# Patient Record
Sex: Female | Born: 1955
Health system: Southern US, Community
[De-identification: ages and names within clinical notes are randomized; demographics above are authoritative.]

## PROBLEM LIST (undated history)

## (undated) DIAGNOSIS — I471 Supraventricular tachycardia, unspecified: Secondary | ICD-10-CM

## (undated) DIAGNOSIS — I5022 Chronic systolic (congestive) heart failure: Secondary | ICD-10-CM

## (undated) DIAGNOSIS — I2589 Other forms of chronic ischemic heart disease: Secondary | ICD-10-CM

## (undated) DIAGNOSIS — N920 Excessive and frequent menstruation with regular cycle: Secondary | ICD-10-CM

## (undated) DIAGNOSIS — R5381 Other malaise: Secondary | ICD-10-CM

## (undated) DIAGNOSIS — I10 Essential (primary) hypertension: Secondary | ICD-10-CM

## (undated) DIAGNOSIS — E538 Deficiency of other specified B group vitamins: Secondary | ICD-10-CM

## (undated) DIAGNOSIS — I456 Pre-excitation syndrome: Secondary | ICD-10-CM

## (undated) DIAGNOSIS — Z789 Other specified health status: Secondary | ICD-10-CM

## (undated) DIAGNOSIS — D259 Leiomyoma of uterus, unspecified: Secondary | ICD-10-CM

## (undated) DIAGNOSIS — M25519 Pain in unspecified shoulder: Secondary | ICD-10-CM

## (undated) DIAGNOSIS — M25569 Pain in unspecified knee: Secondary | ICD-10-CM

## (undated) DIAGNOSIS — R000 Tachycardia, unspecified: Secondary | ICD-10-CM

## (undated) DIAGNOSIS — E039 Hypothyroidism, unspecified: Secondary | ICD-10-CM

## (undated) DIAGNOSIS — D509 Iron deficiency anemia, unspecified: Secondary | ICD-10-CM

## (undated) DIAGNOSIS — T23209A Burn of second degree of unspecified hand, unspecified site, initial encounter: Secondary | ICD-10-CM

## (undated) DIAGNOSIS — R5383 Other fatigue: Secondary | ICD-10-CM

## (undated) HISTORY — DX: Excessive and frequent menstruation with regular cycle: N92.0

## (undated) HISTORY — DX: Other specified health status: Z78.9

## (undated) HISTORY — DX: Other fatigue: R53.83

## (undated) HISTORY — PX: OTHER SURGICAL HISTORY: SHX169

## (undated) HISTORY — DX: Other forms of chronic ischemic heart disease: I25.89

## (undated) HISTORY — DX: Morbid (severe) obesity due to excess calories: E66.01

## (undated) HISTORY — DX: Pain in unspecified knee: M25.569

## (undated) HISTORY — DX: Hypothyroidism, unspecified: E03.9

## (undated) HISTORY — DX: Iron deficiency anemia, unspecified: D50.9

## (undated) HISTORY — DX: Essential (primary) hypertension: I10

## (undated) HISTORY — DX: Deficiency of other specified B group vitamins: E53.8

## (undated) HISTORY — DX: Supraventricular tachycardia, unspecified: I47.10

## (undated) HISTORY — DX: Pain in unspecified shoulder: M25.519

## (undated) HISTORY — DX: Burn of second degree of unspecified hand, unspecified site, initial encounter: T23.209A

## (undated) HISTORY — DX: Supraventricular tachycardia: I47.1

## (undated) HISTORY — DX: Pre-excitation syndrome: I45.6

## (undated) HISTORY — PX: DILATION AND CURETTAGE OF UTERUS: SHX78

## (undated) HISTORY — DX: Tachycardia, unspecified: R00.0

## (undated) HISTORY — DX: Other malaise: R53.81

## (undated) HISTORY — DX: Leiomyoma of uterus, unspecified: D25.9

## (undated) HISTORY — DX: Chronic systolic (congestive) heart failure: I50.22

---

## 2000-01-05 ENCOUNTER — Emergency Department (HOSPITAL_COMMUNITY): Admission: EM | Admit: 2000-01-05 | Discharge: 2000-01-05 | Payer: Self-pay | Admitting: Podiatry

## 2000-01-05 ENCOUNTER — Encounter: Payer: Self-pay | Admitting: *Deleted

## 2002-09-26 ENCOUNTER — Emergency Department (HOSPITAL_COMMUNITY): Admission: EM | Admit: 2002-09-26 | Discharge: 2002-09-26 | Payer: Self-pay | Admitting: Emergency Medicine

## 2004-02-02 ENCOUNTER — Emergency Department (HOSPITAL_COMMUNITY): Admission: EM | Admit: 2004-02-02 | Discharge: 2004-02-02 | Payer: Self-pay | Admitting: Emergency Medicine

## 2004-02-18 ENCOUNTER — Ambulatory Visit: Payer: Self-pay | Admitting: Family Medicine

## 2005-03-16 ENCOUNTER — Ambulatory Visit: Payer: Self-pay | Admitting: Family Medicine

## 2005-04-19 ENCOUNTER — Ambulatory Visit: Payer: Self-pay | Admitting: Family Medicine

## 2005-10-16 ENCOUNTER — Emergency Department (HOSPITAL_COMMUNITY): Admission: EM | Admit: 2005-10-16 | Discharge: 2005-10-16 | Payer: Self-pay | Admitting: Emergency Medicine

## 2005-10-17 ENCOUNTER — Ambulatory Visit: Payer: Self-pay | Admitting: Family Medicine

## 2005-10-21 ENCOUNTER — Ambulatory Visit: Payer: Self-pay | Admitting: Family Medicine

## 2005-11-30 ENCOUNTER — Ambulatory Visit: Payer: Self-pay | Admitting: Family Medicine

## 2005-12-23 ENCOUNTER — Ambulatory Visit: Payer: Self-pay | Admitting: Cardiology

## 2005-12-30 ENCOUNTER — Ambulatory Visit: Payer: Self-pay

## 2005-12-30 ENCOUNTER — Encounter: Payer: Self-pay | Admitting: Cardiology

## 2006-01-06 ENCOUNTER — Ambulatory Visit: Payer: Self-pay | Admitting: Cardiology

## 2006-03-18 ENCOUNTER — Emergency Department (HOSPITAL_COMMUNITY): Admission: EM | Admit: 2006-03-18 | Discharge: 2006-03-18 | Payer: Self-pay | Admitting: *Deleted

## 2006-05-18 ENCOUNTER — Encounter: Payer: Self-pay | Admitting: Family Medicine

## 2006-05-18 DIAGNOSIS — I498 Other specified cardiac arrhythmias: Secondary | ICD-10-CM | POA: Insufficient documentation

## 2006-05-18 DIAGNOSIS — D509 Iron deficiency anemia, unspecified: Secondary | ICD-10-CM | POA: Insufficient documentation

## 2006-06-11 ENCOUNTER — Emergency Department (HOSPITAL_COMMUNITY): Admission: EM | Admit: 2006-06-11 | Discharge: 2006-06-11 | Payer: Self-pay | Admitting: Emergency Medicine

## 2006-06-11 ENCOUNTER — Encounter: Payer: Self-pay | Admitting: Family Medicine

## 2006-08-29 ENCOUNTER — Emergency Department (HOSPITAL_COMMUNITY): Admission: EM | Admit: 2006-08-29 | Discharge: 2006-08-29 | Payer: Self-pay | Admitting: Emergency Medicine

## 2006-10-06 ENCOUNTER — Ambulatory Visit: Payer: Self-pay | Admitting: Family Medicine

## 2006-10-06 LAB — CONVERTED CEMR LAB: Rapid Strep: NEGATIVE

## 2006-10-12 ENCOUNTER — Telehealth (INDEPENDENT_AMBULATORY_CARE_PROVIDER_SITE_OTHER): Payer: Self-pay | Admitting: *Deleted

## 2006-11-28 ENCOUNTER — Ambulatory Visit: Payer: Self-pay | Admitting: Family Medicine

## 2006-11-29 ENCOUNTER — Ambulatory Visit: Payer: Self-pay | Admitting: Family Medicine

## 2006-11-30 ENCOUNTER — Telehealth: Payer: Self-pay | Admitting: Family Medicine

## 2006-12-01 ENCOUNTER — Ambulatory Visit: Payer: Self-pay | Admitting: Oncology

## 2006-12-04 LAB — CONVERTED CEMR LAB
Basophils Relative: 0 % (ref 0.0–1.0)
Eosinophils Absolute: 0.2 10*3/uL (ref 0.0–0.6)
Folate: 3.6 ng/mL
Hemoglobin: 7.7 g/dL — CL (ref 12.0–15.0)
Lymphocytes Relative: 18.5 % (ref 12.0–46.0)
MCV: 63.3 fL — ABNORMAL LOW (ref 78.0–100.0)
Monocytes Absolute: 0.4 10*3/uL (ref 0.2–0.7)
Monocytes Relative: 4 % (ref 3.0–11.0)
Neutro Abs: 7.1 10*3/uL (ref 1.4–7.7)
Platelets: 513 10*3/uL — ABNORMAL HIGH (ref 150–400)
Vitamin B-12: 178 pg/mL — ABNORMAL LOW (ref 211–911)

## 2006-12-07 ENCOUNTER — Ambulatory Visit: Payer: Self-pay | Admitting: Family Medicine

## 2006-12-07 LAB — CBC WITH DIFFERENTIAL (CANCER CENTER ONLY)
BASO#: 0.1 10*3/uL (ref 0.0–0.2)
BASO%: 0.5 % (ref 0.0–2.0)
EOS%: 2.3 % (ref 0.0–7.0)
HCT: 27.9 % — ABNORMAL LOW (ref 34.8–46.6)
LYMPH#: 2.2 10*3/uL (ref 0.9–3.3)
MCH: 18.8 pg — ABNORMAL LOW (ref 26.0–34.0)
MCHC: 30.8 g/dL — ABNORMAL LOW (ref 32.0–36.0)
MONO%: 6.8 % (ref 0.0–13.0)
NEUT#: 6.3 10*3/uL (ref 1.5–6.5)
NEUT%: 66.7 % (ref 39.6–80.0)
RDW: 15.7 % — ABNORMAL HIGH (ref 10.5–14.6)

## 2006-12-07 LAB — MORPHOLOGY - CHCC SATELLITE: PLT EST ~~LOC~~: INCREASED

## 2006-12-08 LAB — FERRITIN: Ferritin: 3 ng/mL — ABNORMAL LOW (ref 10–291)

## 2006-12-08 LAB — COMPREHENSIVE METABOLIC PANEL
ALT: 22 U/L (ref 0–35)
CO2: 23 mEq/L (ref 19–32)
Calcium: 9.2 mg/dL (ref 8.4–10.5)
Chloride: 103 mEq/L (ref 96–112)
Glucose, Bld: 127 mg/dL — ABNORMAL HIGH (ref 70–99)
Sodium: 136 mEq/L (ref 135–145)
Total Bilirubin: 0.4 mg/dL (ref 0.3–1.2)
Total Protein: 6.7 g/dL (ref 6.0–8.3)

## 2006-12-08 LAB — VITAMIN B12: Vitamin B-12: 244 pg/mL (ref 211–911)

## 2006-12-08 LAB — IRON AND TIBC
Iron: 18 ug/dL — ABNORMAL LOW (ref 42–145)
UIBC: 425 ug/dL

## 2007-01-03 ENCOUNTER — Encounter: Payer: Self-pay | Admitting: Family Medicine

## 2007-01-09 ENCOUNTER — Ambulatory Visit (HOSPITAL_COMMUNITY): Admission: RE | Admit: 2007-01-09 | Discharge: 2007-01-09 | Payer: Self-pay | Admitting: Obstetrics and Gynecology

## 2007-02-08 ENCOUNTER — Encounter: Payer: Self-pay | Admitting: Family Medicine

## 2007-03-02 ENCOUNTER — Emergency Department (HOSPITAL_COMMUNITY): Admission: EM | Admit: 2007-03-02 | Discharge: 2007-03-02 | Payer: Self-pay | Admitting: Emergency Medicine

## 2007-03-04 ENCOUNTER — Inpatient Hospital Stay (HOSPITAL_COMMUNITY): Admission: EM | Admit: 2007-03-04 | Discharge: 2007-03-04 | Payer: Self-pay | Admitting: Emergency Medicine

## 2007-03-06 ENCOUNTER — Encounter: Payer: Self-pay | Admitting: Family Medicine

## 2007-03-23 ENCOUNTER — Ambulatory Visit: Payer: Self-pay | Admitting: Cardiology

## 2007-04-12 ENCOUNTER — Encounter: Payer: Self-pay | Admitting: Family Medicine

## 2007-04-30 ENCOUNTER — Ambulatory Visit: Payer: Self-pay | Admitting: Family Medicine

## 2007-04-30 DIAGNOSIS — I456 Pre-excitation syndrome: Secondary | ICD-10-CM | POA: Insufficient documentation

## 2007-04-30 LAB — CONVERTED CEMR LAB
Bilirubin Urine: NEGATIVE
Casts: 0 /lpf
Ketones, urine, test strip: NEGATIVE
Nitrite: NEGATIVE
Yeast, UA: 0
pH: 6

## 2007-05-01 ENCOUNTER — Encounter: Payer: Self-pay | Admitting: Family Medicine

## 2007-05-01 LAB — CONVERTED CEMR LAB
Basophils Absolute: 0 10*3/uL (ref 0.0–0.1)
Lymphocytes Relative: 5.3 % — ABNORMAL LOW (ref 12.0–46.0)
MCHC: 30.9 g/dL (ref 30.0–36.0)
Monocytes Relative: 6.1 % (ref 3.0–12.0)
Neutrophils Relative %: 88 % — ABNORMAL HIGH (ref 43.0–77.0)
Platelets: 336 10*3/uL (ref 150–400)
RDW: 23.4 % — ABNORMAL HIGH (ref 11.5–14.6)

## 2007-05-25 HISTORY — PX: OTHER SURGICAL HISTORY: SHX169

## 2007-06-02 ENCOUNTER — Encounter: Payer: Self-pay | Admitting: Family Medicine

## 2007-10-26 ENCOUNTER — Ambulatory Visit: Payer: Self-pay | Admitting: Family Medicine

## 2007-10-26 DIAGNOSIS — I10 Essential (primary) hypertension: Secondary | ICD-10-CM | POA: Insufficient documentation

## 2007-10-31 LAB — CONVERTED CEMR LAB
Albumin: 4 g/dL (ref 3.5–5.2)
Alkaline Phosphatase: 74 units/L (ref 39–117)
BUN: 6 mg/dL (ref 6–23)
Basophils Absolute: 0 10*3/uL (ref 0.0–0.1)
Cholesterol: 243 mg/dL (ref 0–200)
Direct LDL: 156.3 mg/dL
Folate: 16.8 ng/mL
GFR calc Af Amer: 114 mL/min
GFR calc non Af Amer: 95 mL/min
HDL: 61.4 mg/dL (ref >39.0)
Hemoglobin: 13.1 g/dL (ref 12.0–15.0)
Lymphocytes Relative: 27.9 % (ref 12.0–46.0)
MCHC: 35 g/dL (ref 30.0–36.0)
Neutro Abs: 4.4 10*3/uL (ref 1.4–7.7)
Neutrophils Relative %: 62.4 % (ref 43.0–77.0)
Platelets: 244 10*3/uL (ref 150–400)
Potassium: 4 meq/L (ref 3.5–5.1)
RDW: 15.2 % — ABNORMAL HIGH (ref 11.5–14.6)
Total Bilirubin: 0.8 mg/dL (ref 0.3–1.2)
Total CHOL/HDL Ratio: 4
Triglycerides: 145 mg/dL (ref 0–149)
VLDL: 29 mg/dL (ref 0–40)
Vitamin B-12: 207 pg/mL — ABNORMAL LOW (ref 211–911)

## 2007-11-05 ENCOUNTER — Telehealth: Payer: Self-pay | Admitting: Family Medicine

## 2007-11-06 ENCOUNTER — Ambulatory Visit: Payer: Self-pay | Admitting: Family Medicine

## 2007-11-06 DIAGNOSIS — E039 Hypothyroidism, unspecified: Secondary | ICD-10-CM | POA: Insufficient documentation

## 2007-11-07 LAB — CONVERTED CEMR LAB
Free T4: 0.5 ng/dL — ABNORMAL LOW (ref 0.6–1.6)
T3 Uptake Ratio: 41.5 % — ABNORMAL HIGH (ref 22.5–37.0)

## 2007-11-12 ENCOUNTER — Ambulatory Visit: Payer: Self-pay | Admitting: Family Medicine

## 2007-11-12 DIAGNOSIS — E538 Deficiency of other specified B group vitamins: Secondary | ICD-10-CM | POA: Insufficient documentation

## 2007-12-26 ENCOUNTER — Ambulatory Visit: Payer: Self-pay | Admitting: Family Medicine

## 2008-01-01 LAB — CONVERTED CEMR LAB: Vitamin B-12: 214 pg/mL (ref 211–911)

## 2008-03-14 ENCOUNTER — Emergency Department (HOSPITAL_COMMUNITY): Admission: EM | Admit: 2008-03-14 | Discharge: 2008-03-14 | Payer: Self-pay | Admitting: Emergency Medicine

## 2008-03-14 ENCOUNTER — Encounter (INDEPENDENT_AMBULATORY_CARE_PROVIDER_SITE_OTHER): Payer: Self-pay | Admitting: Internal Medicine

## 2008-03-14 ENCOUNTER — Ambulatory Visit: Payer: Self-pay | Admitting: Family Medicine

## 2008-03-14 DIAGNOSIS — R000 Tachycardia, unspecified: Secondary | ICD-10-CM | POA: Insufficient documentation

## 2008-05-12 ENCOUNTER — Ambulatory Visit: Payer: Self-pay | Admitting: Family Medicine

## 2008-10-03 ENCOUNTER — Telehealth: Payer: Self-pay | Admitting: Family Medicine

## 2008-10-08 ENCOUNTER — Ambulatory Visit: Payer: Self-pay | Admitting: Family Medicine

## 2008-10-09 ENCOUNTER — Encounter: Payer: Self-pay | Admitting: Family Medicine

## 2008-10-17 LAB — CONVERTED CEMR LAB
BUN: 8 mg/dL (ref 6–23)
CO2: 27 meq/L (ref 19–32)
Chloride: 105 meq/L (ref 96–112)
Creatinine, Ser: 0.6 mg/dL (ref 0.4–1.2)
Eosinophils Absolute: 0.1 10*3/uL (ref 0.0–0.7)
MCHC: 34.1 g/dL (ref 30.0–36.0)
MCV: 92.9 fL (ref 78.0–100.0)
Monocytes Absolute: 0.3 10*3/uL (ref 0.1–1.0)
Neutrophils Relative %: 76 % (ref 43.0–77.0)
Platelets: 272 10*3/uL (ref 150.0–400.0)
TSH: 1.59 microintl units/mL (ref 0.35–5.50)
Vitamin B-12: 382 pg/mL (ref 211–911)

## 2008-11-01 ENCOUNTER — Ambulatory Visit: Payer: Self-pay | Admitting: Family Medicine

## 2008-11-01 ENCOUNTER — Encounter: Payer: Self-pay | Admitting: Family Medicine

## 2008-11-01 LAB — CONVERTED CEMR LAB
Blood in Urine, dipstick: NEGATIVE
Nitrite: NEGATIVE
WBC Urine, dipstick: NEGATIVE
pH: 6

## 2009-01-07 ENCOUNTER — Encounter: Payer: Self-pay | Admitting: Family Medicine

## 2009-03-13 ENCOUNTER — Ambulatory Visit: Payer: Self-pay | Admitting: Family Medicine

## 2009-03-13 LAB — CONVERTED CEMR LAB: Rapid Strep: NEGATIVE

## 2009-10-02 IMAGING — CR DG CHEST 1V PORT
1 series · 1 of 1 positions shown · non-contrast
Comparison: Cough chest x-ray of 10/16/2005

CLINICAL DATA: Arrhythmia, fever, cough

PORTABLE CHEST - 1 VIEW

[view not recorded]
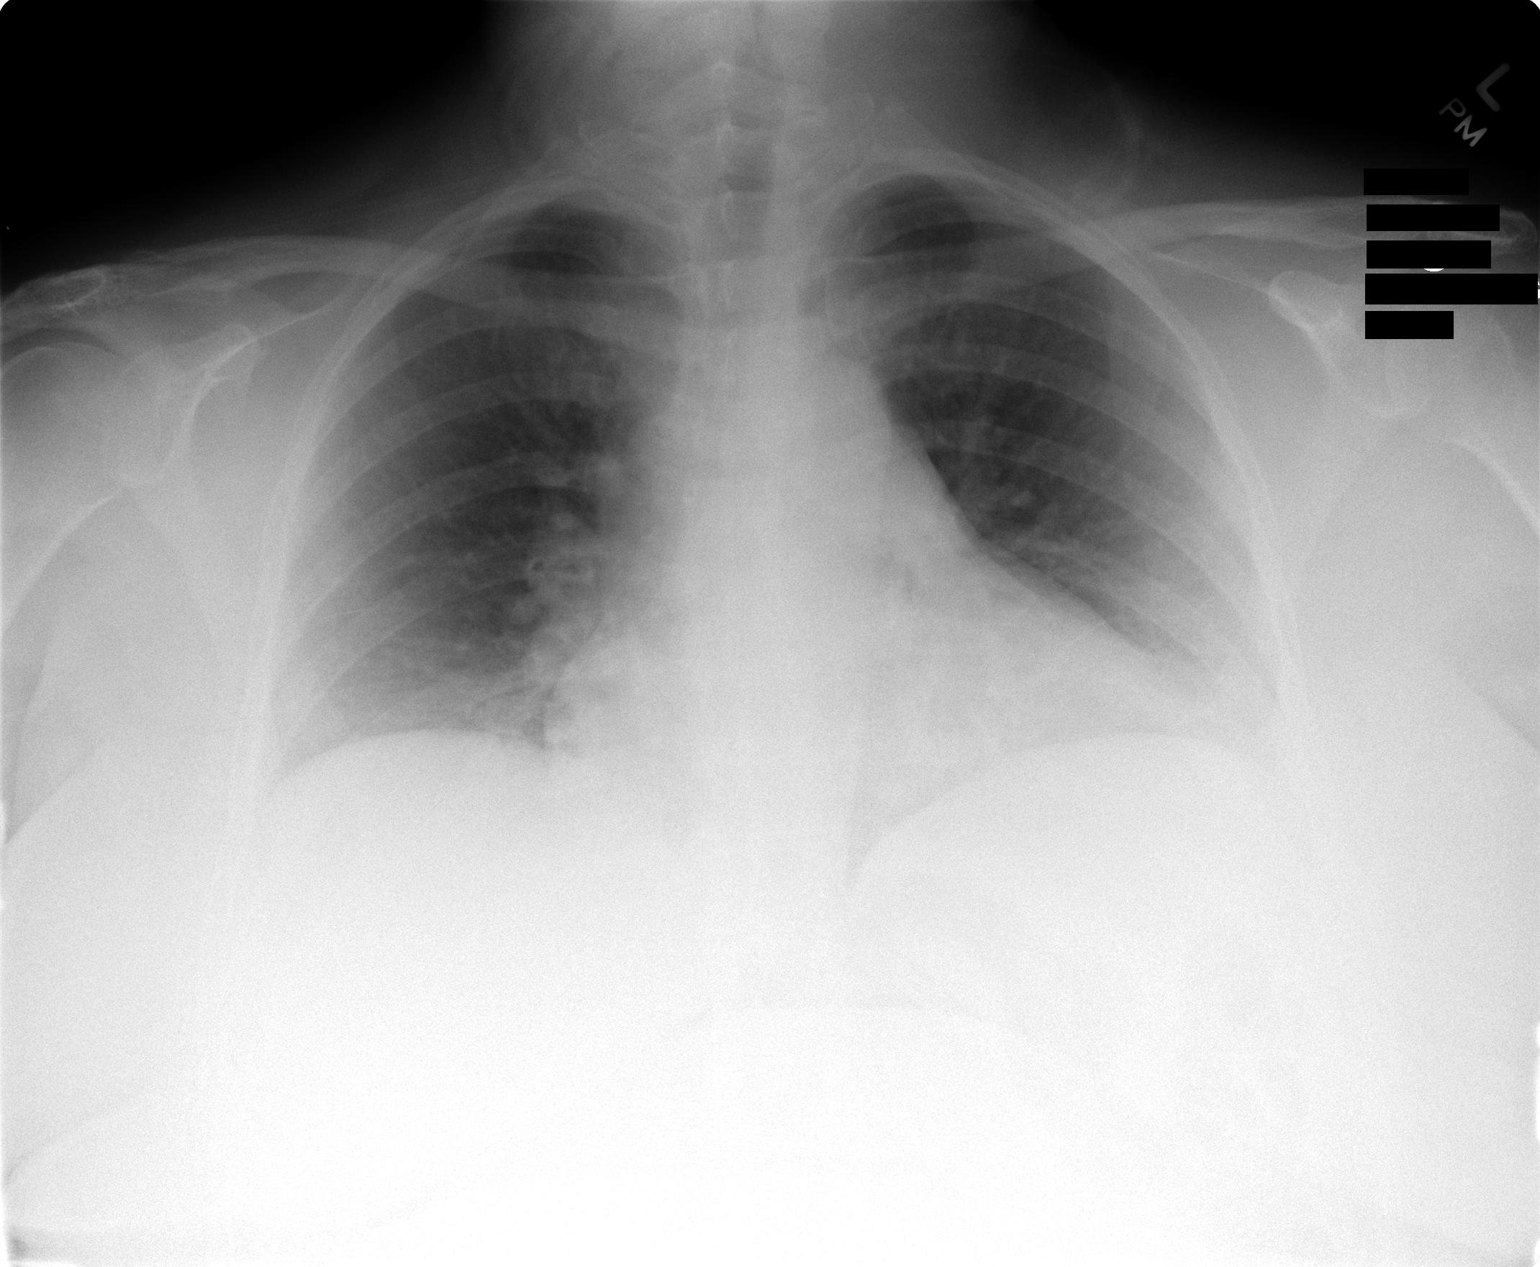

[1 of 1 positions shown; findings below may reference images not displayed]

FINDINGS: The patient has taken a suboptimal inspiration and there
is mild basilar atelectasis present.  No pneumonia or effusion is
seen.  There is mild cardiomegaly present.  No bony abnormality is
noted.
IMPRESSION: Suboptimal inspiration with mild basilar atelectasis.  No
infiltrate or effusion is seen.

## 2010-01-05 ENCOUNTER — Ambulatory Visit (HOSPITAL_COMMUNITY)
Admission: RE | Admit: 2010-01-05 | Discharge: 2010-01-05 | Payer: Self-pay | Source: Home / Self Care | Attending: Family Medicine | Admitting: Family Medicine

## 2010-02-14 ENCOUNTER — Encounter: Payer: Self-pay | Admitting: Obstetrics and Gynecology

## 2010-02-14 ENCOUNTER — Encounter: Payer: Self-pay | Admitting: Family Medicine

## 2010-02-23 NOTE — Letter (Signed)
Summary: Out of Work  [employer] at [facility]  [address] [place], [place] [address]   Phone: [phone]  Fax: [phone]    March 13, 2009   Employee:  Freddie L Ephraim    To Whom It May Concern:   For Medical reasons, please excuse the above named employee from work for the following dates:  Start:   03/12/2009  End:   can return if feeling better 03/18/2009  If you need additional information, please feel free to contact our office.         Sincerely,    Judith Part MD
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
## 2010-03-02 ENCOUNTER — Encounter: Payer: Self-pay | Admitting: Family Medicine

## 2010-03-24 ENCOUNTER — Ambulatory Visit (INDEPENDENT_AMBULATORY_CARE_PROVIDER_SITE_OTHER): Payer: 59 | Admitting: Family Medicine

## 2010-03-24 ENCOUNTER — Encounter: Payer: Self-pay | Admitting: Family Medicine

## 2010-03-24 ENCOUNTER — Other Ambulatory Visit: Payer: Self-pay | Admitting: Family Medicine

## 2010-03-24 DIAGNOSIS — E039 Hypothyroidism, unspecified: Secondary | ICD-10-CM

## 2010-03-24 DIAGNOSIS — E538 Deficiency of other specified B group vitamins: Secondary | ICD-10-CM

## 2010-03-24 DIAGNOSIS — Z0000 Encounter for general adult medical examination without abnormal findings: Secondary | ICD-10-CM

## 2010-03-24 DIAGNOSIS — R928 Other abnormal and inconclusive findings on diagnostic imaging of breast: Secondary | ICD-10-CM | POA: Insufficient documentation

## 2010-03-24 DIAGNOSIS — E785 Hyperlipidemia, unspecified: Secondary | ICD-10-CM | POA: Insufficient documentation

## 2010-03-24 DIAGNOSIS — R7309 Other abnormal glucose: Secondary | ICD-10-CM

## 2010-03-24 DIAGNOSIS — E1165 Type 2 diabetes mellitus with hyperglycemia: Secondary | ICD-10-CM | POA: Insufficient documentation

## 2010-03-24 LAB — CONVERTED CEMR LAB: Glucose, Bld: 171 mg/dL

## 2010-03-25 ENCOUNTER — Encounter: Payer: Self-pay | Admitting: Family Medicine

## 2010-03-25 LAB — CBC WITH DIFFERENTIAL/PLATELET
Basophils Absolute: 0 10*3/uL (ref 0.0–0.1)
Eosinophils Absolute: 0.2 10*3/uL (ref 0.0–0.7)
HCT: 43.5 % (ref 36.0–46.0)
Lymphs Abs: 2.8 10*3/uL (ref 0.7–4.0)
MCHC: 34.7 g/dL (ref 30.0–36.0)
Monocytes Absolute: 0.7 10*3/uL (ref 0.1–1.0)
Monocytes Relative: 5.9 % (ref 3.0–12.0)
Platelets: 303 10*3/uL (ref 150.0–400.0)
RDW: 12.5 % (ref 11.5–14.6)

## 2010-03-25 LAB — BASIC METABOLIC PANEL
BUN: 13 mg/dL (ref 6–23)
GFR: 99.81 mL/min (ref >60.00)
Glucose, Bld: 179 mg/dL — ABNORMAL HIGH (ref 70–99)
Potassium: 4.3 mEq/L (ref 3.5–5.1)

## 2010-03-25 LAB — T3, FREE: T3, Free: 2.6 pg/mL (ref 2.3–4.2)

## 2010-03-25 LAB — HEPATIC FUNCTION PANEL
AST: 22 U/L (ref 0–37)
Total Bilirubin: 0.7 mg/dL (ref 0.3–1.2)

## 2010-03-25 LAB — TSH: TSH: 2.35 u[IU]/mL (ref 0.35–5.50)

## 2010-03-25 LAB — VITAMIN B12: Vitamin B-12: 275 pg/mL (ref 211–911)
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
## 2010-04-05 ENCOUNTER — Encounter: Payer: Self-pay | Admitting: Family Medicine

## 2010-04-13 NOTE — Letter (Signed)
Summary: [facility]   Imported By: Kassie Mends 04/07/2010 08:48:47  _____________________________________________________________________  External Attachment:    Type:   Image     Comment:   External Document

## 2010-05-11 LAB — DIFFERENTIAL
Basophils Relative: 0 % (ref 0–1)
Lymphs Abs: 2.2 10*3/uL (ref 0.7–4.0)
Monocytes Relative: 11 % (ref 3–12)
Neutro Abs: 5.4 10*3/uL (ref 1.7–7.7)
Neutrophils Relative %: 63 % (ref 43–77)

## 2010-05-11 LAB — BASIC METABOLIC PANEL
BUN: 15 mg/dL (ref 6–23)
Calcium: 9.6 mg/dL (ref 8.4–10.5)
Creatinine, Ser: 0.85 mg/dL (ref 0.4–1.2)
GFR calc Af Amer: >60 mL/min (ref >60)
GFR calc non Af Amer: >60 mL/min (ref >60)

## 2010-05-11 LAB — CBC
Platelets: 231 10*3/uL (ref 150–400)
RBC: 4.64 MIL/uL (ref 3.87–5.11)
WBC: 8.6 10*3/uL (ref 4.0–10.5)

## 2010-06-08 NOTE — Assessment & Plan Note (Signed)
[facility]                            CARDIOLOGY OFFICE NOTE   NAME:Rosevear, [name]                        MRN:          [id number]  DATE:03/23/2007                            DOB:          07-Apr-1955    Becky Stout comes to the office today for preoperative clearance for a  possible D&C versus hysterectomy next week at [facility].  She  continues to have bleeding, and her hemoglobin was actually down to 7.1  March 04, 2007.   Unfortunately, she has continued to have breakthroughs with her  tachycardia and SVT.   We have placed her on Diltiazem extended release 240 mg a day, for both  her blood pressure and increasing her threshold for SVT.  She is not on  this today.   Her blood pressure today is 158/97, her pulse is 102 and she is in sinus  tachycardia by EKG.  Weight is 244 and stable.  HEENT:  Normocephalic, atraumatic.  PERLA.  Extraocular movements  intact.  Sclerae are clear.  Facial symmetry is normal.  She is pale.  Respirations 18 and unlabored.  Carotid upstrokes were equal bilaterally; without bruits and no JVD.  Thyroid is not enlarged.  Trachea is midline.  LUNGS:  Clear.  HEART:  Reveals a poorly appreciated PMI.  She has normal S1 and S2,  without gallop.  ABDOMEN:  Soft, good bowel sounds.  Organomegaly could not be adequately  assessed.  EXTREMITIES:  With no cyanosis or clubbing; 1+ edema.  Trace  pulses are intact.  NEUROLOGIC:  Exam is intact.   EKG:  Again, shows sinus tachycardia with RSR prime V1-V2, with some  slight ST-segment depression inferolaterally.   I had a nice talk with Becky Stout today.  We will clear her for surgery.  I am  concerned about her blood pressure and also the risk of SVT.  I placed  her back on Diltiazem extended release 240 mg a day.   We will see her back in about 6-8 weeks for follow-up.   Hopefully she can resolve this anemia by not bleeding further, have  minimal SVT; but, the question of  ablation also is here on the  background.  She will continue to consider this.     Thomas C. Daleen Squibb, MD, [facility]  Electronically Signed    TCW/MedQ  DD: 03/23/2007  DT: 03/24/2007  Job #: [id number]   cc:   [employer] Dub Mikes, NP.  [facility] [employer] A. Milinda Antis, MD

## 2010-06-08 NOTE — Discharge Summary (Signed)
NAME:  Becky Stout, Becky Stout                 ACCOUNT NO.:  [id number]   MEDICAL RECORD NO.:  [id number]          PATIENT TYPE:  INP   LOCATION:  1535                         FACILITY:  [facility]   PHYSICIAN:  Stacie Glaze, MD    DATE OF BIRTH:  04-12-1955   DATE OF ADMISSION:  03/04/2007  DATE OF DISCHARGE:  03/04/2007                               DISCHARGE SUMMARY   ADMITTING DIAGNOSES:  1. Blood loss anemia.  2. Wolff-Parkinson-White syndrome.   HOSPITAL COURSE:  The patient is a pleasant 55 year old white female  with a known history of Wolff-Parkinson-White syndrome who had presented  to the emergency room on multiple occasions for supraventricular  tachycardia related to her anemia.  Her anemia is felt to be secondary  to heavy menses and she has been followed by her gynecologist for  control of this symptomatology.  She has had chronic recurrent anemia  and when her blood counts are low and her oxygenation is low, she has a  flare of her Wolff-Parkinson-White syndrome.  She was also followed by  Dr. Lewayne Bunting for Wolff-Parkinson-White syndrome and ablation is  planned.  However, the issue of her chronic anemia is requested to be  dealt with by her gynecologist prior to her ablation therapy.   Today she presents with a rapid heart rate of approximately 180 in the  emergency room and was given adenosine and she converted to sinus rhythm  with a pulse in the 80s, her respiratory rate was stable at 18.  She was  afebrile and her blood pressure was 129/62.  She was typed and crossed  for 2 units of packed red blood cells and transfused.  After  transfusion, she was discharged home in stable condition with followup  with (1) Dr. Lewayne Bunting and (2) her primary care physician.      Stacie Glaze, MD  Electronically Signed     JEJ/MEDQ  D:  03/04/2007  T:  03/05/2007  Job:  [id number]   cc:   Doylene Canning. Ladona Ridgel, MD  [address]  [address]  [place]  [place] [address]

## 2010-06-11 NOTE — Assessment & Plan Note (Signed)
[facility]                            CARDIOLOGY OFFICE NOTE   NAME:Spychalski, DAWT REEB                        MRN:          [id number]  DATE:01/06/2006                            DOB:          1955/02/06    Ms. Hammar returns today for followup of her hypertension and SVT.  Please see my note from December 23, 2005.   A 2D echocardiogram was essentially normal, with no sign of LVH, EF of  65%, minimal left atrial enlargement and mild mitral regurgitation.   Her edema has improved on the hydrochlorothiazide 12.5 mg per day.  The  diltiazem extended-release 240 has not seemed to abate her tachycardia.  She does not feel like she has been in SVT, however.   PHYSICAL EXAMINATION:  Her blood pressure today is 164/94, her pulse is  110.  The rest of her exam is unchanged except she has only trace edema now in  the legs.   ASSESSMENT:  I spent about 20 minutes talking to her again today about  considering ablation.  She is still convinced that she wants to go with  medications.  She has been INTOLERANT OF BETA BLOCKERS in the past.  Her  blood pressure also is still under poor control.   PLAN:  1. Increased hydrochlorothiazide to 25 mg a day.  2. Check BMP today.  3. Increase diltiazem extended-release to 360 a day.   I will schedule a followup in 6 weeks.  Hopefully things will be under  better control.  She really does not want an ablation.     Thomas C. Daleen Squibb, MD, [facility]  Electronically Signed    TCW/MedQ  DD: 01/06/2006  DT: 01/07/2006  Job #: [id number]   cc:   Marne A. Milinda Antis, MD

## 2010-06-11 NOTE — Assessment & Plan Note (Signed)
[facility]                            [place] OFFICE NOTE   NAME:Stout, Becky GASSETT                        MRN:          [id number]  DATE:12/23/2005                            DOB:          [birth year]    CHIEF COMPLAINT:  My heart keeps going in and out of SVT again.   HISTORY OF PRESENT ILLNESS:  [name] is a 55 year old, single,  white female who has been evaluated in the past by Dr. Lewayne Stout.  Her last office visit was in 2003, for SVT.  She has carried a diagnosis  of SVT for several years.  On the last visit, Dr. Ladona Stout recommended  ablation.  He also recommended continuation of beta-blocker therapy.  She did neither.  She states that beta-blockers in the form of Toprol  and metoprolol make her very tired and fatigued.  She also experienced  some depression on Toprol.  Over the last several weeks she has had  several episodes of SVT.  These have been recorded at 200 to 220 beats  per minute.  She had to visit the emergency room at [facility]  with SVT, requiring adenosine administration on October 16, 2005,  please see Dr. Aram Stout note.  She says that her baseline heart rate  sometimes is fast anyway.  One of the problems is her physical  deconditioning as well as an anemia, chronic iron-deficiency anemia from  heavy periods.  Her last hemoglobin was 8.9 at Dr. Royden Stout office.   PAST MEDICAL HISTORY:  1. She has struggled with being overweight for a number of years.  2. Looking back through her chart, her blood pressure has always been      high in the office.  I reviewed those with her today.   SHE IS INTOLERANT TO TETRACYCLINE.   She is currently on iron 150 mg a day which she erratically takes.  She  has a hard time with high dose iron.   She does not smoke, drink, or use any recreational drugs.   She has had no dye reaction.   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   PAST SURGICAL HISTORY:  Negative.   FAMILY HISTORY:  Her father had quadruple bypass at age 68.   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  She is single and does not have any  children.   REVIEW OF SYSTEMS:  CONSTITUTIONAL:  No fever, chills, significant  weight changes recently.  HEENT:  She wears glasses.  She denies any  hearing problems.  She has had no recent dental problems.  NECK:  H.s.  been unremarkable with no swelling or soreness or decreased flexibility.  CARDIOVASCULAR:  She denies any orthopnea, PND.  She does have some  edema.  She denies any chest pain, per se.  LUNGS:  Denies any chronic  cough.  No hemoptysis.  No chronic infections.  GASTROINTESTINAL:  She  denies nausea, vomiting, dysphagia.  No melena, hematochezia.  GENITOURINARY:  Heavy periods which are slowing down as she ages.  No  urinary frequency, urgency, or hematuria.  MUSCULOSKELETAL:  Negative.  SKIN:  Negative.  LYMPH:  Negative.  NEUROPSYCHIATRIC:  Depression with  beta-blockers.   PHYSICAL EXAMINATION:  GENERAL:  She is very pleasant.  She is in no  acute distress.  SKIN:  Warm and dry, otherwise unremarkable.  Stout SIGNS:  Her blood pressure is 163/86.  Her pulse is 91 and  regular.  Her electrocardiogram shows a sinus rhythm with ST segment  changes, especially in the inferior and lateral leads.  This is not  significantly different from before.  Her weight is 248 pounds.  She is  5 feet 5 inches.  HEENT:  Normocephalic atraumatic.  She is wearing glasses.  She is pale.  PERRLA.  Extraocular movements intact.  Sclerae are clear.  Facial  symmetry is normal.  Dentition is satisfactory.  NECK:  Supple.  Carotid upstrokes are equal bilaterally without bruits.  There is no thyroid enlargement.  Trachea is midline.  LUNGS:  Clear to auscultation.  HEART:  Reveals a dynamic S1 S2.  There is no gallop.  ABDOMEN:  Soft with  good bowel sounds.  There is no tenderness.  There  is no distention.  EXTREMITIES:  Reveal 2+ pretibial edema.  Pulses were bilaterally  symmetrically present in upper and lower extremities.  NEUROLOGIC:  Intact.  MUSCULOSKELETAL:  Intact.   ASSESSMENT:  I had about a 30-minute discussion today with [name].  Her blood pressure has been elevated for a number of years despite the  fact that she says it is always under good control.  This concerns me  quite a bit.  In addition, her supraventricular tachycardia is now  happening more frequently which may just be a cycle but could be a  future pattern of consistency.  She does not tolerate beta-blockers.  I  am not sure medications will hold her as well.   PLAN:  I have discussed all these implications and factors quite openly  with her.  After much discussion we decided on the following plan:  1. Begin HCTZ 12.5 mg a day for hypertension and edema.  We may have      to increase this.  I am starting off slow with her history of      intolerance to medications.  2. Begin diltiazem extended release 240 mg a day, hopefully she can      tolerate this better than she has beta-blockers.  3. Salt restriction.  4. Increase activity.  5. A 2D echocardiogram.   I have put in a significant reinforcement along with Dr. Lubertha Stout  recommendations for ablation.  With her increasing age, severe anemia,  and recurrent SVT, not to mention intolerance to  medications, ablation would be a very attractive alternative.  She still  would like to think about this.  I will see her back in a couple of  weeks for followup blood pressure and the echo.     Becky C. Daleen Squibb, MD, [facility]  Electronically Signed    TCW/MedQ  DD: 12/23/2005  DT: 12/24/2005  Job #: [id number]   cc:   Becky A. Milinda Antis, MD

## 2010-07-09 ENCOUNTER — Encounter: Payer: Self-pay | Admitting: Cardiovascular Disease

## 2010-10-15 LAB — CROSSMATCH
ABO/RH(D): A NEG
Antibody Screen: NEGATIVE

## 2010-10-15 LAB — BASIC METABOLIC PANEL
BUN: 5 — ABNORMAL LOW
Calcium: 8.7
GFR calc non Af Amer: 41 — ABNORMAL LOW
Glucose, Bld: 228 — ABNORMAL HIGH
Potassium: 4.3

## 2010-10-15 LAB — HEMOGLOBIN AND HEMATOCRIT, BLOOD: HCT: 23 — ABNORMAL LOW

## 2010-10-15 LAB — POCT CARDIAC MARKERS
CKMB, poc: <1 — ABNORMAL LOW
Myoglobin, poc: 83.5
Operator id: 3067
Troponin i, poc: <0.05

## 2010-10-15 LAB — DIFFERENTIAL
Basophils Absolute: 0
Eosinophils Absolute: 0
Lymphocytes Relative: 7 — ABNORMAL LOW
Monocytes Relative: 4
Neutrophils Relative %: 89 — ABNORMAL HIGH

## 2010-10-15 LAB — CBC
HCT: 24.9 — ABNORMAL LOW
Platelets: 490 — ABNORMAL HIGH
RDW: 19.6 — ABNORMAL HIGH
WBC: 11.1 — ABNORMAL HIGH

## 2011-04-08 ENCOUNTER — Encounter: Payer: Self-pay | Admitting: Family Medicine

## 2011-04-08 ENCOUNTER — Ambulatory Visit (INDEPENDENT_AMBULATORY_CARE_PROVIDER_SITE_OTHER): Payer: 59 | Admitting: Family Medicine

## 2011-04-08 VITALS — BP 142/86 | HR 88 | Temp 98.3°F | Ht 64.0 in | Wt 237.8 lb

## 2011-04-08 DIAGNOSIS — E039 Hypothyroidism, unspecified: Secondary | ICD-10-CM

## 2011-04-08 DIAGNOSIS — K900 Celiac disease: Secondary | ICD-10-CM

## 2011-04-08 DIAGNOSIS — E785 Hyperlipidemia, unspecified: Secondary | ICD-10-CM

## 2011-04-08 DIAGNOSIS — IMO0002 Reserved for concepts with insufficient information to code with codable children: Secondary | ICD-10-CM

## 2011-04-08 DIAGNOSIS — Z1231 Encounter for screening mammogram for malignant neoplasm of breast: Secondary | ICD-10-CM

## 2011-04-08 DIAGNOSIS — Z0000 Encounter for general adult medical examination without abnormal findings: Secondary | ICD-10-CM

## 2011-04-08 DIAGNOSIS — E119 Type 2 diabetes mellitus without complications: Secondary | ICD-10-CM

## 2011-04-08 DIAGNOSIS — Z1211 Encounter for screening for malignant neoplasm of colon: Secondary | ICD-10-CM

## 2011-04-08 DIAGNOSIS — K9041 Non-celiac gluten sensitivity: Secondary | ICD-10-CM | POA: Insufficient documentation

## 2011-04-08 DIAGNOSIS — E538 Deficiency of other specified B group vitamins: Secondary | ICD-10-CM

## 2011-04-08 MED ORDER — EPINEPHRINE 0.3 MG/0.3ML IJ DEVI: 0.3000 mg | Freq: Once | INTRAMUSCULAR | Status: DC

## 2011-04-08 MED ORDER — THYROID 16.25 MG PO TABS: 1.0000 | ORAL_TABLET | Freq: Every day | ORAL | Status: DC

## 2011-04-08 NOTE — Patient Instructions (Addendum)
Start diabetic education through your job Follow up with me in 6 months  Please make your annual eye exam for diabetes  Please do ifob stool card for colon cancer screening  Labs today We will do referral for mammogram -- you can make your own appt (talk to [facility] on the way out) Avoid red meat/ fried foods/ egg yolks/ fatty breakfast meats/ butter, cheese and high fat dairy/ and shellfish  - for cholesterol Avoid sweets and sugar drinks for diabetes
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
## 2011-04-09 LAB — COMPREHENSIVE METABOLIC PANEL
AST: 25 U/L (ref 0–37)
Albumin: 4.4 g/dL (ref 3.5–5.2)
Alkaline Phosphatase: 90 U/L (ref 39–117)
BUN: 13 mg/dL (ref 6–23)
Creat: 0.64 mg/dL (ref 0.50–1.10)
Glucose, Bld: 211 mg/dL — ABNORMAL HIGH (ref 70–99)
Total Bilirubin: 0.4 mg/dL (ref 0.3–1.2)

## 2011-04-09 LAB — VITAMIN B12: Vitamin B-12: 1975 pg/mL — ABNORMAL HIGH (ref 211–911)

## 2011-04-09 LAB — T3, FREE: T3, Free: 2.8 pg/mL (ref 2.3–4.2)

## 2011-04-09 LAB — T4, FREE: Free T4: 0.91 ng/dL (ref 0.80–1.80)

## 2011-04-10 ENCOUNTER — Encounter: Payer: Self-pay | Admitting: Family Medicine

## 2011-04-10 DIAGNOSIS — Z0000 Encounter for general adult medical examination without abnormal findings: Secondary | ICD-10-CM | POA: Insufficient documentation

## 2011-04-10 NOTE — Assessment & Plan Note (Signed)
Pt now taking po without shots Check level No symptoms

## 2011-04-10 NOTE — Assessment & Plan Note (Signed)
Reviewed health habits including diet and exercise and skin cancer prevention Also reviewed health mt list, fam hx and immunizations  Disc need for wt loss Pt declines some health mt recommendations Rev labs from work and other labs done today

## 2011-04-10 NOTE — Assessment & Plan Note (Signed)
Controlled with lifestyle since pt declines medication for this  Today not bad  disd red flags to watch for  Lab today

## 2011-04-10 NOTE — Assessment & Plan Note (Signed)
Scheduled annual screening mammogram Nl breast exam today  Encouraged monthly self exams   

## 2011-04-10 NOTE — Assessment & Plan Note (Signed)
Disc goals for lipids and reasons to control them Rev labs with pt Rev low sat fat diet in detail Pt declines meds of any sort Disc risks of high chol exp in setting of DM

## 2011-04-10 NOTE — Assessment & Plan Note (Signed)
a1c is now over 9  Pt still hesitant to accept this dx  Pt declines DM teaching/ medication or other interventions She will investigate free nutrional consult from her job Is aware of role of obesity and need for wt loss Agreed to 6 mo f/u - but no other intervention at this time

## 2011-04-10 NOTE — Assessment & Plan Note (Signed)
Changed to naturethroid- pt pref product (not animal based) Lab today Will update if any problems or changes

## 2011-04-10 NOTE — Assessment & Plan Note (Signed)
This is new- , pt does better on gluten free diet Re: GI issues  No susp of celiac dz however at this time

## 2011-04-10 NOTE — Assessment & Plan Note (Signed)
Pt declines colonosc Will do IFOB No stool changes

## 2011-04-13 ENCOUNTER — Encounter: Payer: Self-pay | Admitting: Family Medicine

## 2011-04-13 ENCOUNTER — Encounter: Payer: Self-pay | Admitting: *Deleted

## 2011-11-24 ENCOUNTER — Other Ambulatory Visit: Payer: 59

## 2011-11-28 ENCOUNTER — Ambulatory Visit: Payer: 59 | Admitting: Family Medicine

## 2011-11-30 ENCOUNTER — Ambulatory Visit (INDEPENDENT_AMBULATORY_CARE_PROVIDER_SITE_OTHER): Payer: 59 | Admitting: Family Medicine

## 2011-11-30 ENCOUNTER — Encounter: Payer: Self-pay | Admitting: Family Medicine

## 2011-11-30 VITALS — BP 154/90 | HR 82 | Temp 98.8°F | Ht 64.0 in | Wt 237.5 lb

## 2011-11-30 DIAGNOSIS — IMO0002 Reserved for concepts with insufficient information to code with codable children: Secondary | ICD-10-CM

## 2011-11-30 DIAGNOSIS — E785 Hyperlipidemia, unspecified: Secondary | ICD-10-CM

## 2011-11-30 DIAGNOSIS — E119 Type 2 diabetes mellitus without complications: Secondary | ICD-10-CM

## 2011-11-30 DIAGNOSIS — E039 Hypothyroidism, unspecified: Secondary | ICD-10-CM

## 2011-11-30 LAB — COMPREHENSIVE METABOLIC PANEL
ALT: 29 U/L (ref 0–35)
AST: 23 U/L (ref 0–37)
Albumin: 3.8 g/dL (ref 3.5–5.2)
BUN: 9 mg/dL (ref 6–23)
CO2: 25 mEq/L (ref 19–32)
Calcium: 9.2 mg/dL (ref 8.4–10.5)
Chloride: 104 mEq/L (ref 96–112)
Creatinine, Ser: 0.6 mg/dL (ref 0.4–1.2)
GFR: 115.12 mL/min (ref >60.00)
Potassium: 4 mEq/L (ref 3.5–5.1)

## 2011-11-30 LAB — CBC WITH DIFFERENTIAL/PLATELET
Basophils Absolute: 0 10*3/uL (ref 0.0–0.1)
Basophils Relative: 0.6 % (ref 0.0–3.0)
Eosinophils Absolute: 0.1 10*3/uL (ref 0.0–0.7)
Hemoglobin: 14.1 g/dL (ref 12.0–15.0)
Lymphocytes Relative: 22.1 % (ref 12.0–46.0)
MCHC: 33.2 g/dL (ref 30.0–36.0)
Monocytes Relative: 7.4 % (ref 3.0–12.0)
Neutro Abs: 5.5 10*3/uL (ref 1.4–7.7)
Neutrophils Relative %: 68.8 % (ref 43.0–77.0)
RBC: 4.65 Mil/uL (ref 3.87–5.11)

## 2011-11-30 LAB — LIPID PANEL
Total CHOL/HDL Ratio: 5
VLDL: 62.8 mg/dL — ABNORMAL HIGH (ref 0.0–40.0)

## 2011-11-30 LAB — LDL CHOLESTEROL, DIRECT: Direct LDL: 171.9 mg/dL

## 2011-11-30 LAB — MICROALBUMIN / CREATININE URINE RATIO
Creatinine,U: 73 mg/dL
Microalb Creat Ratio: 1 mg/g (ref 0.0–30.0)

## 2011-11-30 MED ORDER — THYROID 16.25 MG PO TABS: 1.0000 | ORAL_TABLET | Freq: Every day | ORAL | Status: DC

## 2011-11-30 NOTE — Assessment & Plan Note (Signed)
Pt declines any tx for lipids - but wants to check them today Disc low sat fat diet and risks of high chol

## 2011-11-30 NOTE — Assessment & Plan Note (Signed)
Pt declines DM teaching but has books Diet not optimal and no exercise  She declines tx at this time Will check a1c and microalb however  Urged to start changing lifestyle habits and disc long term consequences of untx DM

## 2011-11-30 NOTE — Assessment & Plan Note (Signed)
tsh today No clinical changes Refilled her med - which she tolerates well No heart palpitations

## 2011-11-30 NOTE — Assessment & Plan Note (Signed)
bp high today - pt blames it on stress She declines tx at this time but we did disc pos of ace in future given her DM
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
## 2011-11-30 NOTE — Patient Instructions (Addendum)
bp is high - so work on [employer] and exericse Do your best not to stress eat  Labs today  I refilled your thyroid medicine

## 2011-12-02 ENCOUNTER — Encounter: Payer: Self-pay | Admitting: *Deleted

## 2011-12-05 ENCOUNTER — Encounter: Payer: Self-pay | Admitting: *Deleted

## 2012-01-02 ENCOUNTER — Encounter: Payer: Self-pay | Admitting: Family Medicine

## 2012-04-13 ENCOUNTER — Encounter: Payer: 59 | Admitting: Family Medicine

## 2012-04-27 ENCOUNTER — Other Ambulatory Visit: Payer: 59

## 2012-04-27 ENCOUNTER — Ambulatory Visit (INDEPENDENT_AMBULATORY_CARE_PROVIDER_SITE_OTHER): Payer: 59 | Admitting: Family Medicine

## 2012-04-27 ENCOUNTER — Encounter: Payer: Self-pay | Admitting: Family Medicine

## 2012-04-27 VITALS — BP 130/80 | HR 88 | Temp 98.1°F | Ht 64.25 in | Wt 233.2 lb

## 2012-04-27 DIAGNOSIS — Z0000 Encounter for general adult medical examination without abnormal findings: Secondary | ICD-10-CM

## 2012-04-27 DIAGNOSIS — E039 Hypothyroidism, unspecified: Secondary | ICD-10-CM

## 2012-04-27 DIAGNOSIS — IMO0002 Reserved for concepts with insufficient information to code with codable children: Secondary | ICD-10-CM

## 2012-04-27 DIAGNOSIS — Z1211 Encounter for screening for malignant neoplasm of colon: Secondary | ICD-10-CM

## 2012-04-27 DIAGNOSIS — E785 Hyperlipidemia, unspecified: Secondary | ICD-10-CM

## 2012-04-27 DIAGNOSIS — E119 Type 2 diabetes mellitus without complications: Secondary | ICD-10-CM

## 2012-04-27 MED ORDER — THYROID 16.25 MG PO TABS: 1.0000 | ORAL_TABLET | Freq: Every day | ORAL | Status: DC

## 2012-04-27 NOTE — Assessment & Plan Note (Signed)
Pt continues to decline any DM tx whatsoever She was counseled on risks of DM and need for lifestyle change and wt loss Info given

## 2012-04-27 NOTE — Assessment & Plan Note (Signed)
Stable tsh fall  F/u 6 mo  No change in dose

## 2012-04-27 NOTE — Assessment & Plan Note (Signed)
bp is stable today  No cp or palpitations or headaches or edema  No side effects to medicines  BP Readings from Last 3 Encounters:  04/27/12 130/80  11/30/11 154/90  04/08/11 142/86

## 2012-04-27 NOTE — Assessment & Plan Note (Signed)
Reviewed health habits including diet and exercise and skin cancer prevention Also reviewed health mt list, fam hx and immunizations  Pt declines most health mt Nl breast exam today Given ifob

## 2012-04-27 NOTE — Assessment & Plan Note (Signed)
Pt declines colonosc but will do IFOB

## 2012-04-27 NOTE — Patient Instructions (Addendum)
Sugar is high - your diabetes is worsening - when you are ready to address this please let me know Also cholesterol is high and warrants treatment  Here are some handouts- try to avoid fats and sugars in diet Do ifob card for colon cancer screen If you change your mind about any immunizations or screening tests please let me know  Follow up in 6 months
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
## 2012-04-27 NOTE — Assessment & Plan Note (Signed)
LDL down, but trig up due to worsening DM She declines tx Disc goals for lipids and reasons to control them Rev labs with pt Rev low sat fat diet in detail

## 2012-10-31 ENCOUNTER — Ambulatory Visit: Payer: 59 | Admitting: Family Medicine

## 2012-12-05 ENCOUNTER — Other Ambulatory Visit: Payer: Self-pay | Admitting: Family Medicine

## 2012-12-21 ENCOUNTER — Ambulatory Visit (INDEPENDENT_AMBULATORY_CARE_PROVIDER_SITE_OTHER): Payer: 59 | Admitting: Family Medicine

## 2012-12-21 ENCOUNTER — Encounter: Payer: Self-pay | Admitting: Family Medicine

## 2012-12-21 ENCOUNTER — Telehealth: Payer: Self-pay | Admitting: Family Medicine

## 2012-12-21 VITALS — BP 128/78 | HR 87 | Temp 98.3°F | Ht 64.25 in

## 2012-12-21 DIAGNOSIS — N760 Acute vaginitis: Secondary | ICD-10-CM

## 2012-12-21 DIAGNOSIS — E039 Hypothyroidism, unspecified: Secondary | ICD-10-CM

## 2012-12-21 DIAGNOSIS — E119 Type 2 diabetes mellitus without complications: Secondary | ICD-10-CM

## 2012-12-21 DIAGNOSIS — E785 Hyperlipidemia, unspecified: Secondary | ICD-10-CM

## 2012-12-21 DIAGNOSIS — IMO0002 Reserved for concepts with insufficient information to code with codable children: Secondary | ICD-10-CM

## 2012-12-21 DIAGNOSIS — R350 Frequency of micturition: Secondary | ICD-10-CM

## 2012-12-21 LAB — COMPREHENSIVE METABOLIC PANEL
ALT: 36 U/L — ABNORMAL HIGH (ref 0–35)
AST: 25 U/L (ref 0–37)
Alkaline Phosphatase: 77 U/L (ref 39–117)
Creatinine, Ser: 0.6 mg/dL (ref 0.4–1.2)
GFR: 114.66 mL/min (ref >60.00)
Sodium: 135 mEq/L (ref 135–145)
Total Bilirubin: 0.8 mg/dL (ref 0.3–1.2)

## 2012-12-21 LAB — POCT WET PREP (WET MOUNT): KOH Wet Prep POC: POSITIVE

## 2012-12-21 LAB — LIPID PANEL
Cholesterol: 243 mg/dL — ABNORMAL HIGH (ref 0–200)
HDL: 56.1 mg/dL (ref >39.00)
Total CHOL/HDL Ratio: 4
Triglycerides: 203 mg/dL — ABNORMAL HIGH (ref 0.0–149.0)

## 2012-12-21 LAB — POCT URINALYSIS DIPSTICK
Bilirubin, UA: NEGATIVE
Nitrite, UA: NEGATIVE
pH, UA: 6

## 2012-12-21 LAB — LDL CHOLESTEROL, DIRECT: Direct LDL: 163.6 mg/dL

## 2012-12-21 MED ORDER — TERCONAZOLE 0.4 % VA CREA: TOPICAL_CREAM | VAGINAL | Status: DC

## 2012-12-21 MED ORDER — THYROID 16.25 MG PO TABS: 1.0000 | ORAL_TABLET | Freq: Every day | ORAL | Status: DC

## 2012-12-21 NOTE — Telephone Encounter (Signed)
Please send for urine culture and let pt know we are doing that-Unsure if uti ? Tell her to drink lots of water  I put the order in

## 2012-12-21 NOTE — Progress Notes (Signed)
Pre-visit discussion using our clinic review tool. No additional management support is needed unless otherwise documented below in the visit note.  

## 2012-12-21 NOTE — Telephone Encounter (Signed)
Pt notified and culture sent.

## 2012-12-21 NOTE — Patient Instructions (Signed)
Keep the itchy area as dry as possible and use the terazol cream daily  Update me if no improvement or any problems  Stop caffeine  Lab today  Try work on a healthier diet
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
## 2012-12-23 NOTE — Assessment & Plan Note (Signed)
Lab today for A1C- pt did request that  Pt declines any treatment whatsoever - and understands the risks of that  Disc lifestyle change and dire need for low sugar diet and wt loss

## 2012-12-23 NOTE — Assessment & Plan Note (Signed)
Disc goals for lipids and reasons to control them Lab today  Rev low sat fat diet in detail  

## 2012-12-23 NOTE — Assessment & Plan Note (Signed)
Lab today  Has had some SVT lately -no other clinical changes

## 2012-12-23 NOTE — Assessment & Plan Note (Signed)
BP: 128/78 mmHg  bp in fair control at this time  No changes needed Disc lifstyle change with low sodium diet and exercise   Lab today

## 2012-12-23 NOTE — Assessment & Plan Note (Signed)
tx with terazol cream externally Disc need to keep dry

## 2012-12-27 ENCOUNTER — Encounter: Payer: Self-pay | Admitting: *Deleted

## 2012-12-28 ENCOUNTER — Ambulatory Visit: Payer: 59 | Admitting: Family Medicine

## 2013-05-10 ENCOUNTER — Encounter: Payer: Self-pay | Admitting: Family Medicine

## 2013-05-10 ENCOUNTER — Ambulatory Visit (INDEPENDENT_AMBULATORY_CARE_PROVIDER_SITE_OTHER): Payer: 59 | Admitting: Family Medicine

## 2013-05-10 VITALS — BP 128/74 | HR 92 | Temp 98.7°F | Ht 64.25 in | Wt 227.8 lb

## 2013-05-10 DIAGNOSIS — IMO0002 Reserved for concepts with insufficient information to code with codable children: Secondary | ICD-10-CM

## 2013-05-10 DIAGNOSIS — E119 Type 2 diabetes mellitus without complications: Secondary | ICD-10-CM

## 2013-05-10 DIAGNOSIS — Z0000 Encounter for general adult medical examination without abnormal findings: Secondary | ICD-10-CM

## 2013-05-10 DIAGNOSIS — Z1211 Encounter for screening for malignant neoplasm of colon: Secondary | ICD-10-CM

## 2013-05-10 DIAGNOSIS — E785 Hyperlipidemia, unspecified: Secondary | ICD-10-CM

## 2013-05-10 DIAGNOSIS — E039 Hypothyroidism, unspecified: Secondary | ICD-10-CM

## 2013-05-10 LAB — CBC WITH DIFFERENTIAL/PLATELET
BASOS ABS: 0 10*3/uL (ref 0.0–0.1)
BASOS PCT: 0 % (ref 0–1)
EOS ABS: 0.1 10*3/uL (ref 0.0–0.7)
EOS PCT: 1 % (ref 0–5)
HCT: 42.1 % (ref 36.0–46.0)
Hemoglobin: 14.3 g/dL (ref 12.0–15.0)
LYMPHS ABS: 2.5 10*3/uL (ref 0.7–4.0)
Lymphocytes Relative: 27 % (ref 12–46)
MCH: 30.4 pg (ref 26.0–34.0)
MCHC: 34 g/dL (ref 30.0–36.0)
MCV: 89.6 fL (ref 78.0–100.0)
Monocytes Absolute: 0.6 10*3/uL (ref 0.1–1.0)
Monocytes Relative: 7 % (ref 3–12)
NEUTROS PCT: 65 % (ref 43–77)
Neutro Abs: 6 10*3/uL (ref 1.7–7.7)
PLATELETS: 261 10*3/uL (ref 150–400)
RBC: 4.7 MIL/uL (ref 3.87–5.11)
RDW: 13.4 % (ref 11.5–15.5)
WBC: 9.2 10*3/uL (ref 4.0–10.5)

## 2013-05-10 LAB — COMPREHENSIVE METABOLIC PANEL
ALK PHOS: 91 U/L (ref 39–117)
ALT: 30 U/L (ref 0–35)
AST: 20 U/L (ref 0–37)
Albumin: 4.1 g/dL (ref 3.5–5.2)
BILIRUBIN TOTAL: 0.6 mg/dL (ref 0.2–1.2)
BUN: 12 mg/dL (ref 6–23)
CO2: 23 meq/L (ref 19–32)
CREATININE: 0.56 mg/dL (ref 0.50–1.10)
Calcium: 9.6 mg/dL (ref 8.4–10.5)
Chloride: 102 mEq/L (ref 96–112)
Glucose, Bld: 210 mg/dL — ABNORMAL HIGH (ref 70–99)
Potassium: 4.6 mEq/L (ref 3.5–5.3)
SODIUM: 139 meq/L (ref 135–145)
TOTAL PROTEIN: 6.7 g/dL (ref 6.0–8.3)

## 2013-05-10 LAB — LIPID PANEL
CHOL/HDL RATIO: 4.6 ratio
Cholesterol: 288 mg/dL — ABNORMAL HIGH (ref 0–200)
HDL: 63 mg/dL (ref >39)
LDL CALC: 170 mg/dL — AB (ref 0–99)
Triglycerides: 276 mg/dL — ABNORMAL HIGH (ref <150)
VLDL: 55 mg/dL — AB (ref 0–40)

## 2013-05-10 MED ORDER — EPINEPHRINE 0.3 MG/0.3ML IJ SOAJ: 0.3000 mg | Freq: Once | INTRAMUSCULAR | Status: DC

## 2013-05-10 MED ORDER — THYROID 16.25 MG PO TABS: 1.0000 | ORAL_TABLET | Freq: Every day | ORAL | Status: DC

## 2013-05-10 NOTE — Progress Notes (Signed)
Pre visit review using our clinic review tool, if applicable. No additional management support is needed unless otherwise documented below in the visit note. 
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
## 2013-05-10 NOTE — Patient Instructions (Addendum)
Labs today  Take care of yourself  If you change your mind about any health mt tests or treatment of your diabetes let me know Work on weight loss and healthy diet and exercise

## 2013-05-11 LAB — HEMOGLOBIN A1C
HEMOGLOBIN A1C: 11.1 % — AB (ref <5.7)
MEAN PLASMA GLUCOSE: 272 mg/dL — AB (ref <117)

## 2013-05-11 LAB — TSH: TSH: 1.793 u[IU]/mL (ref 0.350–4.500)

## 2013-05-12 NOTE — Assessment & Plan Note (Signed)
Reviewed health habits including diet and exercise and skin cancer prevention Reviewed appropriate screening tests for age  Also reviewed health mt list, fam hx and immunization status , as well as social and family history   Pt declines almost all health mt tests  Labs done for work/ biometrics

## 2013-05-12 NOTE — Assessment & Plan Note (Signed)
Hypothyroidism  Pt has no clinical changes No change in energy level/ hair or skin/ edema and no tremor tsh today She takes a nature made thyroid supplement that she likes

## 2013-05-12 NOTE — Assessment & Plan Note (Signed)
bp in fair control at this time  BP Readings from Last 1 Encounters:  05/10/13 128/74   No changes needed Disc lifstyle change with low sodium diet and exercise

## 2013-05-12 NOTE — Assessment & Plan Note (Signed)
Understanding risks -pt still declines any form of tx for this  She admits diet is not optimal Aware wt loss is impt  Will have A1C done today

## 2013-05-12 NOTE — Assessment & Plan Note (Signed)
Pt declines any form of screening -incl colonosc or IFOB card currently

## 2013-05-12 NOTE — Assessment & Plan Note (Signed)
Lipid panel done today Disc low sat fat diet

## 2013-05-13 ENCOUNTER — Encounter: Payer: Self-pay | Admitting: *Deleted

## 2013-05-13 ENCOUNTER — Telehealth: Payer: Self-pay | Admitting: Family Medicine

## 2013-05-13 NOTE — Telephone Encounter (Signed)
emmi mailed to patient °

## 2013-05-22 ENCOUNTER — Telehealth: Payer: Self-pay

## 2013-05-22 NOTE — Telephone Encounter (Signed)
Relevant patient education mailed to patient.  

## 2013-06-18 ENCOUNTER — Encounter: Payer: Self-pay | Admitting: Family Medicine

## 2013-06-18 ENCOUNTER — Ambulatory Visit (INDEPENDENT_AMBULATORY_CARE_PROVIDER_SITE_OTHER): Payer: 59 | Admitting: Family Medicine

## 2013-06-18 VITALS — BP 122/82 | HR 98 | Temp 98.7°F | Ht 64.25 in | Wt 227.0 lb

## 2013-06-18 DIAGNOSIS — M2550 Pain in unspecified joint: Secondary | ICD-10-CM

## 2013-06-18 DIAGNOSIS — R21 Rash and other nonspecific skin eruption: Secondary | ICD-10-CM | POA: Insufficient documentation

## 2013-06-18 LAB — SEDIMENTATION RATE: SED RATE: 19 mm/h (ref 0–22)

## 2013-06-18 MED ORDER — METRONIDAZOLE 0.75 % EX GEL: 1.0000 "application " | Freq: Two times a day (BID) | CUTANEOUS | Status: DC

## 2013-06-18 NOTE — Progress Notes (Signed)
Pre visit review using our clinic review tool, if applicable. No additional management support is needed unless otherwise documented below in the visit note. 

## 2013-06-18 NOTE — Patient Instructions (Signed)
Labs today for autoimmune joint disease (lupus is what I am most interested in)  Keep face clean and avoid sun exposure (use sunscreen)  Use the metrogel on face for possible rosacea as directed

## 2013-06-18 NOTE — Assessment & Plan Note (Signed)
This resembles both rosacea (in pt with ruddy complexion) and also the butterfly rash of SLE Will tx with metrogel and also given info on rosacea and what to avoid lifestyle wise  Disc use of a gentle cleanser as well as sunscreen  Lab for ANA today
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
## 2013-06-18 NOTE — Assessment & Plan Note (Signed)
Intermittent joint pain - different joints - with swelling and pain that lasts a day or so  Also facial rash in butterfly distribution  SLE or other autoimmune joint dz is in differential-lab today Also wear and tear due to obesity  Pend results to make a plan

## 2013-06-19 LAB — ANA: Anti Nuclear Antibody(ANA): NEGATIVE

## 2013-06-19 LAB — RHEUMATOID FACTOR

## 2013-08-02 ENCOUNTER — Telehealth: Payer: Self-pay | Admitting: *Deleted

## 2013-08-02 NOTE — Telephone Encounter (Signed)
Lm on pts vm requesting a call back to schedule DIABETIC BUNDLE LAB APPT-needing A1C and LDL

## 2013-08-28 ENCOUNTER — Telehealth: Payer: Self-pay | Admitting: *Deleted

## 2013-08-28 DIAGNOSIS — E119 Type 2 diabetes mellitus without complications: Secondary | ICD-10-CM

## 2013-08-28 DIAGNOSIS — E039 Hypothyroidism, unspecified: Secondary | ICD-10-CM

## 2013-08-28 NOTE — Telephone Encounter (Signed)
Morey Hummingbird advise me that pt would like a referral to Dr. Cruzita Lederer to help manage her thyroid and her DM, please advise Morey Hummingbird once referral is done

## 2013-08-28 NOTE — Telephone Encounter (Signed)
I'm glad she wants to do that  Ref done

## 2013-08-28 NOTE — Telephone Encounter (Signed)
[place] notified referral done

## 2013-09-20 ENCOUNTER — Ambulatory Visit: Payer: 59 | Admitting: Internal Medicine

## 2013-11-25 ENCOUNTER — Telehealth: Payer: Self-pay

## 2013-11-25 MED ORDER — LEVOTHYROXINE SODIUM 50 MCG PO TABS: 50.0000 ug | ORAL_TABLET | Freq: Every day | ORAL | Status: DC

## 2013-11-25 NOTE — Telephone Encounter (Signed)
Pt left message; pt has been having several problems from [place] thyroid. She has rosacea, it can increase sugar, changes in appetite, increased heart rate and SOB. Pt wants to know if can be changed to Synthroid or whatever you recommend. Pt has an appt with Dr Rockey Situ on 12/16/13. [facility].Please advise.

## 2013-11-25 NOTE — Telephone Encounter (Signed)
Left voicemail requesting pt to call office back 

## 2013-11-25 NOTE — Telephone Encounter (Signed)
I will send px for levothyroxine to her pharmacy  It may take a bit of time to figure out what dose she needs I'm going to start with 50 mcg Please check tsh in 4-6 weeks - earlier if she does not feel right Stop the armour

## 2013-11-26 NOTE — Telephone Encounter (Signed)
Pt notified Rx sent to pharmacy and advise of Dr. Marliss Coots comments/recommendations pt will call back to schedule lab appt

## 2013-12-13 ENCOUNTER — Ambulatory Visit (INDEPENDENT_AMBULATORY_CARE_PROVIDER_SITE_OTHER): Payer: 59 | Admitting: Cardiovascular Disease

## 2013-12-13 ENCOUNTER — Encounter (INDEPENDENT_AMBULATORY_CARE_PROVIDER_SITE_OTHER): Payer: Self-pay

## 2013-12-13 ENCOUNTER — Encounter: Payer: Self-pay | Admitting: Cardiovascular Disease

## 2013-12-13 VITALS — BP 140/82 | HR 98 | Ht 64.0 in | Wt 229.5 lb

## 2013-12-13 DIAGNOSIS — I456 Pre-excitation syndrome: Secondary | ICD-10-CM

## 2013-12-13 DIAGNOSIS — E119 Type 2 diabetes mellitus without complications: Secondary | ICD-10-CM

## 2013-12-13 DIAGNOSIS — I10 Essential (primary) hypertension: Secondary | ICD-10-CM

## 2013-12-13 DIAGNOSIS — R0602 Shortness of breath: Secondary | ICD-10-CM

## 2013-12-13 DIAGNOSIS — E785 Hyperlipidemia, unspecified: Secondary | ICD-10-CM

## 2013-12-13 DIAGNOSIS — R0789 Other chest pain: Secondary | ICD-10-CM

## 2013-12-13 MED ORDER — PROPRANOLOL HCL 10 MG PO TABS: 10.0000 mg | ORAL_TABLET | Freq: Three times a day (TID) | ORAL | Status: DC | PRN

## 2013-12-13 MED ORDER — FUROSEMIDE 20 MG PO TABS: 20.0000 mg | ORAL_TABLET | Freq: Every day | ORAL | Status: DC | PRN

## 2013-12-13 NOTE — Assessment & Plan Note (Addendum)
Etiology is unclear. Differential diagnosis includes diastolic CHF. We have offered Lasix and sent a prescription in for her to take as needed with banana or potassium. She is obese and deconditioned which may also be contributing. She has tachycardia and we have given her propranolol to take as needed. She will monitor her heart rate at home at rest and with exertion. Elevated heart rate likely secondary to anxiety on her visit today, possibly also deconditioning.  Prior echocardiogram in 2007 was normal. Clinical exam benign. EKG unchanged. She does not want further testing at this time. We have offered echocardiogram. She will call us if shortness of breath gets worse. Certainly should consider ischemia though symptoms are atypical in nature, occurring sometimes at rest. If symptoms get worse, additional testing could be performed

## 2013-12-13 NOTE — Assessment & Plan Note (Signed)
No recent symptoms concerning for arrhythmia. Occasionally she has fluttering tachycardia secondary to sinus tachycardia.

## 2013-12-13 NOTE — Assessment & Plan Note (Signed)
We have stressed to her the importance of better diabetes control and the damage on her eyes and kidneys. She has indicated that she does not want medications. She would benefit from metformin, and other medications. Strongly recommended regular exercise, weight loss, low carbohydrate diet. Improvement in her diabetes numbers will improve her cholesterol numbers

## 2013-12-13 NOTE — Progress Notes (Signed)
Patient ID: Becky Stout, female    DOB: 04/19/1955, 58 y.o.   MRN: [id number]  HPI Comments: Becky Stout is a 58 year old woman with a history of WPW per the notes, obesity, poorly controlled diabetes, hyperlipidemia who presents for new patient evaluation to the [facility] office for shortness of breath.  She reports that over the past several months she has noticed increasing shortness of breath. She has shortness of breath when sitting on the couch, some shortness of breath that comes and goes when she does her groceries. She does not do much exercise. She works at [employer] and walks down the stairs to her car in the parking lot. She is able to do this without limitations. She has noticed minimal leg swelling. This seems to come and go. Occasion he has racing heart beat, denies having any symptoms concerning for her accessory pathway and WPW She does have significant work stress.  In general she does not like taking medications. She does not want medication for cholesterol or diabetes. She reports that her father had complications from his cholesterol pill, possibly myalgias  Previous   echocardiogram in 2007 which was essentially normal.  EKG in 2010 showed sinus tachycardia with diffuse T-wave abnormality EKG on today's visit shows normal sinus rhythm with rate 98 bpm, diffuse T-wave abnormality, no significant change from her prior EKG  Outpatient Encounter Prescriptions as of 12/13/2013  Medication Sig  . aspirin 325 MG tablet Take 325 mg by mouth once.   [name] EPINEPHrine (EPI-PEN) 0.3 mg/0.3 mL SOAJ injection Inject 0.3 mLs (0.3 mg total) into the muscle once.  [name] levothyroxine (SYNTHROID, LEVOTHROID) 50 MCG tablet Take 1 tablet (50 mcg total) by mouth daily.  . NON FORMULARY Omega 3 supplements- 3 pills daily   . VITAMIN D, CHOLECALCIFEROL, PO Take by mouth.    . furosemide (LASIX) 20 MG tablet Take 1 tablet (20 mg total) by mouth daily as needed.  . propranolol  (INDERAL) 10 MG tablet Take 1 tablet (10 mg total) by mouth 3 (three) times daily as needed.  . [DISCONTINUED] metroNIDAZOLE (METROGEL) 0.75 % gel Apply 1 application topically 2 (two) times daily. To affected area    Social history  reports that she has never smoked. She does not have any smokeless tobacco history on file. She reports that she does not drink alcohol or use illicit drugs.   Family history  family history includes Arrhythmia in her mother; Diabetes in an other family member; Heart attack in her father; Heart disease in her father.   Allergies  Allergies  Allergen Reactions  . Shellfish Allergy Anaphylaxis    Can't breathe  . Gluten Meal     Gluten free  . Hydrocod Polst-Cpm Polst Er     REACTION: makes cough worse  . Oseltamivir Phosphate     REACTION: hives  . Tetracycline     REACTION: hives     Review of Systems  Constitutional: Negative.   Respiratory: Positive for shortness of breath.   Cardiovascular: Positive for leg swelling.  Gastrointestinal: Negative.   Musculoskeletal: Negative.   Allergic/Immunologic: Negative.   Neurological: Negative.   Hematological: Negative.   Psychiatric/Behavioral: Negative.     BP 140/82 mmHg  Pulse 98  Ht 5\' 4"  (1.626 m)  Wt 229 lb 8 oz (104.101 kg)  BMI 39.37 kg/m2  Physical Exam  Constitutional: She is oriented to person, place, and time. She appears well-developed and well-nourished.  Obese  HENT:  Head: Normocephalic.  Nose: Nose normal.  Mouth/Throat: Oropharynx is clear and moist.  Eyes: Conjunctivae are normal. Pupils are equal, round, and reactive to light.  Neck: Normal range of motion. Neck supple. No JVD present.  Cardiovascular: Normal rate, regular rhythm, normal heart sounds and intact distal pulses.  Exam reveals no gallop and no friction rub.   No murmur heard. Nonpitting leg swelling  Pulmonary/Chest: Effort normal and breath sounds normal. No respiratory distress. She has no wheezes. She  has no rales. She exhibits no tenderness.  Abdominal: Soft. Bowel sounds are normal. She exhibits no distension. There is no tenderness.  Musculoskeletal: Normal range of motion. She exhibits no edema or tenderness.  Lymphadenopathy:    She has no cervical adenopathy.  Neurological: She is alert and oriented to person, place, and time. Coordination normal.  Skin: Skin is warm and dry. No rash noted. No erythema.  Psychiatric: She has a normal mood and affect. Her behavior is normal. Judgment and thought content normal.    Assessment and Plan  Nursing note and vitals reviewed.

## 2013-12-13 NOTE — Assessment & Plan Note (Signed)
Cholesterol 270. Long discussion about her cholesterol. She does not want a statin. Suggested she work on weight loss, eating oatmeal, red yeast rice, consider other prescription medications such as zetia or WelChol.

## 2013-12-13 NOTE — Patient Instructions (Addendum)
You are doing well. We will call in propanolol (B-blocker) for heart rate control (10 mg ), take as needed  We have also called in lasix/furosemide, Diuretic, take with a banana  Ways to decrease cholesterol: Red Yeast Rice Oatmeal. Decrease weight  Script: Zetia (not a statin) welchol  Please call us if you have new issues that need to be addressed before your next appt.  Call the office if shortness of breath gets worse

## 2013-12-13 NOTE — Assessment & Plan Note (Signed)
Blood pressure is borderline elevated. Likely elevated in the setting of obesity. Recommended weight loss, close monitoring of her blood pressure numbers

## 2013-12-20 ENCOUNTER — Other Ambulatory Visit: Payer: 59

## 2014-01-02 ENCOUNTER — Telehealth: Payer: Self-pay | Admitting: Family Medicine

## 2014-01-02 DIAGNOSIS — E785 Hyperlipidemia, unspecified: Secondary | ICD-10-CM

## 2014-01-02 DIAGNOSIS — I10 Essential (primary) hypertension: Secondary | ICD-10-CM

## 2014-01-02 DIAGNOSIS — E538 Deficiency of other specified B group vitamins: Secondary | ICD-10-CM

## 2014-01-02 DIAGNOSIS — E039 Hypothyroidism, unspecified: Secondary | ICD-10-CM

## 2014-01-02 DIAGNOSIS — E119 Type 2 diabetes mellitus without complications: Secondary | ICD-10-CM

## 2014-01-02 NOTE — Telephone Encounter (Signed)
-----   Message from [name] sent at 12/25/2013  3:09 PM EST ----- Regarding: Lab orders for Friday, 12.11.15 Lab orders for a f/u appt

## 2014-01-03 ENCOUNTER — Other Ambulatory Visit (INDEPENDENT_AMBULATORY_CARE_PROVIDER_SITE_OTHER): Payer: 59

## 2014-01-03 DIAGNOSIS — E039 Hypothyroidism, unspecified: Secondary | ICD-10-CM

## 2014-01-03 DIAGNOSIS — E119 Type 2 diabetes mellitus without complications: Secondary | ICD-10-CM

## 2014-01-03 DIAGNOSIS — I10 Essential (primary) hypertension: Secondary | ICD-10-CM

## 2014-01-03 DIAGNOSIS — E538 Deficiency of other specified B group vitamins: Secondary | ICD-10-CM

## 2014-01-03 DIAGNOSIS — E785 Hyperlipidemia, unspecified: Secondary | ICD-10-CM

## 2014-01-03 LAB — COMPREHENSIVE METABOLIC PANEL
ALK PHOS: 82 U/L (ref 39–117)
ALT: 30 U/L (ref 0–35)
AST: 22 U/L (ref 0–37)
Albumin: 3.8 g/dL (ref 3.5–5.2)
BILIRUBIN TOTAL: 0.6 mg/dL (ref 0.2–1.2)
BUN: 10 mg/dL (ref 6–23)
CO2: 20 meq/L (ref 19–32)
CREATININE: 0.6 mg/dL (ref 0.4–1.2)
Calcium: 9.3 mg/dL (ref 8.4–10.5)
Chloride: 100 mEq/L (ref 96–112)
GFR: 121.45 mL/min (ref >60.00)
GLUCOSE: 271 mg/dL — AB (ref 70–99)
Potassium: 4 mEq/L (ref 3.5–5.1)
Sodium: 131 mEq/L — ABNORMAL LOW (ref 135–145)
Total Protein: 6.7 g/dL (ref 6.0–8.3)

## 2014-01-03 LAB — HEMOGLOBIN A1C: Hgb A1c MFr Bld: 11.9 % — ABNORMAL HIGH (ref 4.6–6.5)

## 2014-01-03 LAB — LDL CHOLESTEROL, DIRECT: Direct LDL: 185.5 mg/dL

## 2014-01-03 LAB — LIPID PANEL
Cholesterol: 278 mg/dL — ABNORMAL HIGH (ref 0–200)
HDL: 60.5 mg/dL (ref >39.00)
NonHDL: 217.5
TRIGLYCERIDES: 324 mg/dL — AB (ref 0.0–149.0)
Total CHOL/HDL Ratio: 5
VLDL: 64.8 mg/dL — ABNORMAL HIGH (ref 0.0–40.0)

## 2014-01-03 LAB — TSH: TSH: 2.3 u[IU]/mL (ref 0.35–4.50)

## 2014-01-03 LAB — VITAMIN B12: VITAMIN B 12: 195 pg/mL — AB (ref 211–911)

## 2014-01-10 ENCOUNTER — Encounter: Payer: Self-pay | Admitting: Family Medicine

## 2014-01-10 ENCOUNTER — Ambulatory Visit (INDEPENDENT_AMBULATORY_CARE_PROVIDER_SITE_OTHER): Payer: 59 | Admitting: Family Medicine

## 2014-01-10 VITALS — BP 138/82 | HR 97 | Temp 98.5°F | Ht 64.0 in | Wt 230.5 lb

## 2014-01-10 DIAGNOSIS — E119 Type 2 diabetes mellitus without complications: Secondary | ICD-10-CM

## 2014-01-10 DIAGNOSIS — I10 Essential (primary) hypertension: Secondary | ICD-10-CM

## 2014-01-10 DIAGNOSIS — E039 Hypothyroidism, unspecified: Secondary | ICD-10-CM

## 2014-01-10 DIAGNOSIS — E871 Hypo-osmolality and hyponatremia: Secondary | ICD-10-CM

## 2014-01-10 DIAGNOSIS — S20219A Contusion of unspecified front wall of thorax, initial encounter: Secondary | ICD-10-CM | POA: Insufficient documentation

## 2014-01-10 DIAGNOSIS — E785 Hyperlipidemia, unspecified: Secondary | ICD-10-CM

## 2014-01-10 DIAGNOSIS — S20212A Contusion of left front wall of thorax, initial encounter: Secondary | ICD-10-CM

## 2014-01-10 MED ORDER — LEVOTHYROXINE SODIUM 50 MCG PO TABS: 50.0000 ug | ORAL_TABLET | Freq: Every day | ORAL | Status: DC

## 2014-01-10 NOTE — Progress Notes (Signed)
Pre visit review using our clinic review tool, if applicable. No additional management support is needed unless otherwise documented below in the visit note. 

## 2014-01-10 NOTE — Patient Instructions (Addendum)
We need to re check your sodium level in 1-2 weeks - I'm not sure why this is low  Blood pressure is stable  Diabetes is worse -please let me know when you are ready to treat this  Cholesterol is high - let me know if you are ever open to medicine  Avoid red meat/ fried foods/ egg yolks/ fatty breakfast meats/ butter, cheese and high fat dairy/ and shellfish    Warm compresses to ribs -they will take a while to get better  If you develop cough or fever let us know   Make a non fasting lab appt for 1-2 week
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
## 2014-01-11 NOTE — Assessment & Plan Note (Signed)
Lab Results  Component Value Date   HGBA1C 11.9* 01/03/2014   Pt CONTINUES TO DECLINE TREATMENT OF ANY KIND  She still wants to check A1C occasionally  I made it clear that diet and exercise and wt loss are imperative - but that she also needs medication I again stressed the imp of prev end organ damage - and disc the risks in detail   We will check this again when she tells me she wants to check it

## 2014-01-11 NOTE — Assessment & Plan Note (Signed)
Unsure of etiol ?  Hypothyroid is controlled No diarrhea/ vomiting Re check in 1-2 weeks

## 2014-01-11 NOTE — Assessment & Plan Note (Signed)
L sided after a fall No sob or other symptoms  Mildly tender Disc imp of deep breaths to prevent atelectasis  Analgesics/heat prn

## 2014-01-11 NOTE — Assessment & Plan Note (Signed)
bp in fair control at this time  BP Readings from Last 1 Encounters:  01/10/14 138/82   No changes needed Disc lifstyle change with low sodium diet and exercise   Pt declines medication/ ace to protect kidneys   Labs reviewed

## 2014-01-11 NOTE — Assessment & Plan Note (Signed)
Hypothyroidism  Pt has no clinical changes No change in energy level/ hair or skin/ edema and no tremor Lab Results  Component Value Date   TSH 2.30 01/03/2014

## 2014-01-11 NOTE — Assessment & Plan Note (Signed)
Disc goals for lipids and reasons to control them Rev labs with pt Rev low sat fat diet in detail  She declines tx of any kind  LDL is quite high-aware of risks  Rev diet in detail

## 2014-01-21 ENCOUNTER — Telehealth: Payer: Self-pay | Admitting: Family Medicine

## 2014-01-21 DIAGNOSIS — E871 Hypo-osmolality and hyponatremia: Secondary | ICD-10-CM

## 2014-01-21 NOTE — Telephone Encounter (Signed)
-----   Message from [name] sent at 01/14/2014  3:26 PM EST ----- Regarding: Lab orders for Wednesday, 12.30.15 Lab orders, no f/u

## 2014-01-22 ENCOUNTER — Other Ambulatory Visit (INDEPENDENT_AMBULATORY_CARE_PROVIDER_SITE_OTHER): Payer: 59

## 2014-01-22 DIAGNOSIS — E871 Hypo-osmolality and hyponatremia: Secondary | ICD-10-CM

## 2014-01-22 LAB — BASIC METABOLIC PANEL
BUN: 10 mg/dL (ref 6–23)
CHLORIDE: 102 meq/L (ref 96–112)
CO2: 25 meq/L (ref 19–32)
Calcium: 9.1 mg/dL (ref 8.4–10.5)
Creatinine, Ser: 0.6 mg/dL (ref 0.4–1.2)
GFR: 116.53 mL/min (ref >60.00)
GLUCOSE: 310 mg/dL — AB (ref 70–99)
POTASSIUM: 4.3 meq/L (ref 3.5–5.1)
Sodium: 136 mEq/L (ref 135–145)

## 2014-01-23 ENCOUNTER — Encounter: Payer: Self-pay | Admitting: Family Medicine

## 2014-05-16 ENCOUNTER — Encounter: Payer: Self-pay | Admitting: Family Medicine

## 2014-05-16 ENCOUNTER — Ambulatory Visit (INDEPENDENT_AMBULATORY_CARE_PROVIDER_SITE_OTHER): Payer: 59 | Admitting: Family Medicine

## 2014-05-16 VITALS — BP 132/80 | HR 90 | Temp 98.8°F | Ht 65.0 in | Wt 222.5 lb

## 2014-05-16 DIAGNOSIS — Z0000 Encounter for general adult medical examination without abnormal findings: Secondary | ICD-10-CM | POA: Diagnosis not present

## 2014-05-16 DIAGNOSIS — E039 Hypothyroidism, unspecified: Secondary | ICD-10-CM | POA: Diagnosis not present

## 2014-05-16 DIAGNOSIS — I10 Essential (primary) hypertension: Secondary | ICD-10-CM

## 2014-05-16 DIAGNOSIS — E785 Hyperlipidemia, unspecified: Secondary | ICD-10-CM

## 2014-05-16 DIAGNOSIS — E119 Type 2 diabetes mellitus without complications: Secondary | ICD-10-CM | POA: Diagnosis not present

## 2014-05-16 MED ORDER — LEVOTHYROXINE SODIUM 50 MCG PO TABS: 50.0000 ug | ORAL_TABLET | Freq: Every day | ORAL | Status: DC
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
## 2014-05-16 NOTE — Assessment & Plan Note (Signed)
Reviewed health habits including diet and exercise and skin cancer prevention Reviewed appropriate screening tests for age  Also reviewed health mt list, fam hx and immunization status , as well as social and family history   Lab today for A1C and lipid for her biometric form for work  She declines almost all other health mt

## 2014-05-16 NOTE — Assessment & Plan Note (Signed)
Lab Results  Component Value Date   TSH 2.30 01/03/2014   No clinical changes Check today with labs  Med refilled

## 2014-05-16 NOTE — Assessment & Plan Note (Signed)
Lab Results  Component Value Date   HGBA1C 11.9* 01/03/2014   Re check today Pt refuses any treatment for this  ? If she watches diet , does not exercise much Most likely in denial  Urged again to consider tx as this will cause end organ damage  Eye exam utd A1C today Has lost 8 lb

## 2014-05-16 NOTE — Progress Notes (Signed)
Pre visit review using our clinic review tool, if applicable. No additional management support is needed unless otherwise documented below in the visit note. 

## 2014-05-16 NOTE — Patient Instructions (Signed)
Labs today  I highly recommend treating your diabetes- let me know when you are ready  I will finish form and fax it when labs return  Please work on low sugar/low fat diet and weight loss

## 2014-05-16 NOTE — Assessment & Plan Note (Signed)
bp in fair control at this time  BP Readings from Last 1 Encounters:  05/16/14 132/80    Disc lifstyle change with low sodium diet and exercise  Pt declines treatment for this -disc goals for bp control for diabetics

## 2014-05-16 NOTE — Assessment & Plan Note (Signed)
Lipid panel today Disc goals for a diabetic She declines tx Rev low sat fat diet

## 2014-05-17 LAB — LIPID PANEL
Cholesterol: 274 mg/dL — ABNORMAL HIGH (ref 0–200)
HDL: 65 mg/dL (ref >=46)
LDL CALC: 160 mg/dL — AB (ref 0–99)
Total CHOL/HDL Ratio: 4.2 Ratio
Triglycerides: 244 mg/dL — ABNORMAL HIGH (ref <150)
VLDL: 49 mg/dL — AB (ref 0–40)

## 2014-05-17 LAB — TSH: TSH: 1.502 u[IU]/mL (ref 0.350–4.500)

## 2014-05-19 ENCOUNTER — Encounter: Payer: Self-pay | Admitting: *Deleted

## 2014-05-26 ENCOUNTER — Encounter: Payer: Self-pay | Admitting: Family Medicine

## 2014-05-26 ENCOUNTER — Ambulatory Visit (INDEPENDENT_AMBULATORY_CARE_PROVIDER_SITE_OTHER): Payer: 59 | Admitting: Family Medicine

## 2014-05-26 VITALS — BP 110/68 | HR 109 | Temp 98.8°F | Ht 65.0 in | Wt 220.8 lb

## 2014-05-26 DIAGNOSIS — R0602 Shortness of breath: Secondary | ICD-10-CM

## 2014-05-26 DIAGNOSIS — R000 Tachycardia, unspecified: Secondary | ICD-10-CM | POA: Diagnosis not present

## 2014-05-26 MED ORDER — PROPRANOLOL HCL 10 MG PO TABS: 10.0000 mg | ORAL_TABLET | Freq: Three times a day (TID) | ORAL | Status: DC
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
## 2014-05-26 NOTE — Progress Notes (Signed)
Pre visit review using our clinic review tool, if applicable. No additional management support is needed unless otherwise documented below in the visit note. 

## 2014-05-26 NOTE — Assessment & Plan Note (Signed)
Pt thinks she had an acute episode of SVT Friday that corrected itself and now feels generally sob  Her EKG is baseline with sinus tachycardia and T wave inv  Rev last cardiol note-she never picked up propranolol- I px this 10 mg to take tid prn rapid HR (she knows how to take pulse)  Disc poss side eff  Vitals are stable as is pulse ox and exam  I enc pt to get someone to stay with her /check in on her tonight - she refused  She has also refused to go to hospital for SVT - difficult situation  Enc close f/u, and enc 2D echo as suggested by Dr Rockey Situ

## 2014-05-26 NOTE — Assessment & Plan Note (Signed)
Sinus tach on EKG with baseline T wave changes Pt has hx of SVT and recent episode  Having problems getting HR down and breathing under control  Pt refuses to seek care in ER or return to cardiology at this time and refused echocardiogram- she understands risks  Px propranolol 10 mg for tid prn dosing - (pt had never picked this up previously when cardiology px it) Refuses to stay with family or let them check up on her  I enc her to check in with [place] frequently Disc imp of checking pulse on beta blocker , and disc poss side eff

## 2014-05-26 NOTE — Patient Instructions (Signed)
Take the propranolol to slow down your pulse -this should help your shortness of breath  You can take it up to three times per day until you are feeling better If no improvement we need to get you back to the cardiologist  If your heart goes out of rhythm again please go to the ER   Please keep me posted with how you feel

## 2014-07-17 ENCOUNTER — Telehealth: Payer: Self-pay

## 2014-07-17 NOTE — Telephone Encounter (Signed)
Diabetic Bundle. Left voicemail advising pt her A1C blood test is due. Pt advised to contact PCP's office to schedule.  

## 2015-05-22 ENCOUNTER — Encounter: Payer: Self-pay | Admitting: Family Medicine

## 2015-05-22 ENCOUNTER — Ambulatory Visit (INDEPENDENT_AMBULATORY_CARE_PROVIDER_SITE_OTHER): Payer: 59 | Admitting: Family Medicine

## 2015-05-22 VITALS — BP 122/78 | HR 87 | Temp 98.2°F | Ht 64.5 in | Wt 214.0 lb

## 2015-05-22 DIAGNOSIS — Z0000 Encounter for general adult medical examination without abnormal findings: Secondary | ICD-10-CM | POA: Diagnosis not present

## 2015-05-22 DIAGNOSIS — E119 Type 2 diabetes mellitus without complications: Secondary | ICD-10-CM

## 2015-05-22 DIAGNOSIS — I10 Essential (primary) hypertension: Secondary | ICD-10-CM

## 2015-05-22 DIAGNOSIS — E669 Obesity, unspecified: Secondary | ICD-10-CM | POA: Insufficient documentation

## 2015-05-22 DIAGNOSIS — E039 Hypothyroidism, unspecified: Secondary | ICD-10-CM

## 2015-05-22 DIAGNOSIS — E785 Hyperlipidemia, unspecified: Secondary | ICD-10-CM

## 2015-05-22 DIAGNOSIS — E538 Deficiency of other specified B group vitamins: Secondary | ICD-10-CM

## 2015-05-22 LAB — COMPREHENSIVE METABOLIC PANEL
ALK PHOS: 86 U/L (ref 39–117)
ALT: 26 U/L (ref 0–35)
AST: 18 U/L (ref 0–37)
Albumin: 4.4 g/dL (ref 3.5–5.2)
BUN: 12 mg/dL (ref 6–23)
CO2: 25 mEq/L (ref 19–32)
Calcium: 10.1 mg/dL (ref 8.4–10.5)
Chloride: 97 mEq/L (ref 96–112)
Creatinine, Ser: 0.62 mg/dL (ref 0.40–1.20)
GFR: 105.25 mL/min (ref >60.00)
Glucose, Bld: 247 mg/dL — ABNORMAL HIGH (ref 70–99)
Potassium: 4 mEq/L (ref 3.5–5.1)
Sodium: 132 mEq/L — ABNORMAL LOW (ref 135–145)
TOTAL PROTEIN: 7.7 g/dL (ref 6.0–8.3)
Total Bilirubin: 0.8 mg/dL (ref 0.2–1.2)

## 2015-05-22 LAB — CBC WITH DIFFERENTIAL/PLATELET
Basophils Absolute: 0 10*3/uL (ref 0.0–0.1)
Basophils Relative: 0.4 % (ref 0.0–3.0)
EOS ABS: 0.1 10*3/uL (ref 0.0–0.7)
Eosinophils Relative: 0.7 % (ref 0.0–5.0)
HCT: 43.4 % (ref 36.0–46.0)
Hemoglobin: 14.6 g/dL (ref 12.0–15.0)
LYMPHS ABS: 2.7 10*3/uL (ref 0.7–4.0)
LYMPHS PCT: 27.7 % (ref 12.0–46.0)
MCHC: 33.6 g/dL (ref 30.0–36.0)
MCV: 89.9 fl (ref 78.0–100.0)
MONOS PCT: 7.4 % (ref 3.0–12.0)
Monocytes Absolute: 0.7 10*3/uL (ref 0.1–1.0)
Neutro Abs: 6.2 10*3/uL (ref 1.4–7.7)
Neutrophils Relative %: 63.8 % (ref 43.0–77.0)
Platelets: 315 10*3/uL (ref 150.0–400.0)
RBC: 4.82 Mil/uL (ref 3.87–5.11)
RDW: 12.9 % (ref 11.5–15.5)
WBC: 9.7 10*3/uL (ref 4.0–10.5)

## 2015-05-22 LAB — TSH: TSH: 1.21 u[IU]/mL (ref 0.35–4.50)

## 2015-05-22 LAB — HEMOGLOBIN A1C: Hgb A1c MFr Bld: 10 % — ABNORMAL HIGH (ref 4.6–6.5)

## 2015-05-22 LAB — VITAMIN B12: Vitamin B-12: 203 pg/mL — ABNORMAL LOW (ref 211–911)

## 2015-05-22 LAB — LIPID PANEL
CHOLESTEROL: 309 mg/dL — AB (ref 0–200)
HDL: 68.2 mg/dL (ref >39.00)
LDL CALC: 201 mg/dL — AB (ref 0–99)
NonHDL: 240.95
Total CHOL/HDL Ratio: 5
Triglycerides: 200 mg/dL — ABNORMAL HIGH (ref 0.0–149.0)
VLDL: 40 mg/dL (ref 0.0–40.0)

## 2015-05-22 MED ORDER — LEVOTHYROXINE SODIUM 50 MCG PO TABS: 50.0000 ug | ORAL_TABLET | Freq: Every day | ORAL | Status: DC

## 2015-05-22 NOTE — Progress Notes (Signed)
Pre visit review using our clinic review tool, if applicable. No additional management support is needed unless otherwise documented below in the visit note. 
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
## 2015-05-22 NOTE — Patient Instructions (Signed)
Labs today  If you change your mind about treating diabetes or doing cancer screening let me know  Make sure to make an eye doctor appointment for diabetic eye exam  Take care of yourself  I will fill out work forms when labs return

## 2015-05-24 NOTE — Assessment & Plan Note (Signed)
No clinical changes  tsh today

## 2015-05-24 NOTE — Assessment & Plan Note (Signed)
Discussed how this problem influences overall health and the risks it imposes  Reviewed plan for weight loss with lower calorie diet (via better food choices and also portion control or program like weight watchers) and exercise building up to or more than 30 minutes 5 days per week including some aerobic activity   She has lost some wt due to hyperglycemia

## 2015-05-24 NOTE — Assessment & Plan Note (Signed)
Disc goals for lipids and reasons to control them Rev labs with pt (from last check) Rev low sat fat diet in detail Labs today 

## 2015-05-24 NOTE — Assessment & Plan Note (Signed)
bp in fair control at this time  BP Readings from Last 1 Encounters:  05/22/15 122/78   No changes needed Disc lifstyle change with low sodium diet and exercise  Wt loss enc

## 2015-05-24 NOTE — Assessment & Plan Note (Signed)
B12 level today Pt is not taking any B12 or getting shots currently Energy level is fair

## 2015-05-24 NOTE — Assessment & Plan Note (Signed)
Reviewed health habits including diet and exercise and skin cancer prevention Reviewed appropriate screening tests for age  Also reviewed health mt list, fam hx and immunization status , as well as social and family history   See Health Mt tab- she still declines most health mt - but has to come yearly for biometric form for work  Enc wt loss   Labs today  If you change your mind about treating diabetes or doing cancer screening let me know  Make sure to make an eye doctor appointment for diabetic eye exam  Take care of yourself  I will fill out work forms when labs return

## 2015-05-24 NOTE — Assessment & Plan Note (Signed)
Pt declines any treatment whatsoever (still) Is interested in tracking A1C Counseled re: DM diet and need for wt loss as well as foot care and regular eye exams She wants to schedule her own eye exam

## 2015-09-14 ENCOUNTER — Telehealth: Payer: Self-pay | Admitting: Cardiovascular Disease
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
## 2015-09-15 ENCOUNTER — Ambulatory Visit (INDEPENDENT_AMBULATORY_CARE_PROVIDER_SITE_OTHER): Payer: 59 | Admitting: Cardiovascular Disease

## 2015-09-15 ENCOUNTER — Ambulatory Visit: Payer: 59 | Admitting: Family Medicine

## 2015-09-15 ENCOUNTER — Encounter: Payer: Self-pay | Admitting: Cardiovascular Disease

## 2015-09-15 VITALS — BP 138/83 | HR 90 | Ht 65.0 in | Wt 211.5 lb

## 2015-09-15 DIAGNOSIS — R0602 Shortness of breath: Secondary | ICD-10-CM | POA: Diagnosis not present

## 2015-09-15 DIAGNOSIS — E785 Hyperlipidemia, unspecified: Secondary | ICD-10-CM

## 2015-09-15 DIAGNOSIS — R55 Syncope and collapse: Secondary | ICD-10-CM

## 2015-09-15 DIAGNOSIS — R079 Chest pain, unspecified: Secondary | ICD-10-CM

## 2015-09-15 DIAGNOSIS — F419 Anxiety disorder, unspecified: Secondary | ICD-10-CM | POA: Insufficient documentation

## 2015-09-15 DIAGNOSIS — E119 Type 2 diabetes mellitus without complications: Secondary | ICD-10-CM

## 2015-09-15 DIAGNOSIS — E669 Obesity, unspecified: Secondary | ICD-10-CM

## 2015-09-15 NOTE — Patient Instructions (Addendum)
Medication Instructions:   No medication changes  Labwork:  No new labs  Testing/Procedures:  30 day monitor your syncope, hx of WPW Your physician has recommended that you wear an event monitor. Event monitors are medical devices that record the heart's electrical activity. Doctors most often Becky Stout these monitors to diagnose arrhythmias. Arrhythmias are problems with the speed or rhythm of the heartbeat. The monitor is a small, portable device. You can wear one while you do your normal daily activities. This is usually used to diagnose what is causing palpitations/syncope (passing out). [employer] will call you to verify your address before they mail the monitor to you. It is very important that you answer this call.   Follow-Up: It was a pleasure seeing you in the office today. Please call Becky Stout if you have new issues that need to be addressed before your next appt.  [phone]  Your physician wants you to follow-up in: 6 weeks  If you need a refill on your cardiac medications before your next appointment, please call your pharmacy.   Cardiac Event Monitoring A cardiac event monitor is a small recording device used to help detect abnormal heart rhythms (arrhythmias). The monitor is used to record heart rhythm when noticeable symptoms such as the following occur:  Fast heartbeats (palpitations), such as heart racing or fluttering.  Dizziness.  Fainting or light-headedness.  Unexplained weakness. The monitor is wired to two electrodes placed on your chest. Electrodes are flat, sticky disks that attach to your skin. The monitor can be worn for up to 30 days. You will wear the monitor at all times, except when bathing.  HOW TO USE YOUR CARDIAC EVENT MONITOR A technician will prepare your chest for the electrode placement. The technician will show you how to place the electrodes, how to work the monitor, and how to replace the batteries. Take time to practice using the monitor before  you leave the office. Make sure you understand how to send the information from the monitor to your health care provider. This requires a telephone with a landline, not a cell phone. You need to:  Wear your monitor at all times, except when you are in water:  Do not get the monitor wet.  Take the monitor off when bathing. Do not swim or use a hot tub with it on.  Keep your skin clean. Do not put body lotion or moisturizer on your chest.  Change the electrodes daily or any time they stop sticking to your skin. You might need to use tape to keep them on.  It is possible that your skin under the electrodes could become irritated. To keep this from happening, try to put the electrodes in slightly different places on your chest. However, they must remain in the area under your left breast and in the upper right section of your chest.  Make sure the monitor is safely clipped to your clothing or in a location close to your body that your health care provider recommends.  Press the button to record when you feel symptoms of heart trouble, such as dizziness, weakness, light-headedness, palpitations, thumping, shortness of breath, unexplained weakness, or a fluttering or racing heart. The monitor is always on and records what happened slightly before you pressed the button, so do not worry about being too late to get good information.  Keep a diary of your activities, such as walking, doing chores, and taking medicine. It is especially important to note what you were doing when you pushed the  button to record your symptoms. This will help your health care provider determine what might be contributing to your symptoms. The information stored in your monitor will be reviewed by your health care provider alongside your diary entries.  Send the recorded information as recommended by your health care provider. It is important to understand that it will take some time for your health care provider to process the  results.  Change the batteries as recommended by your health care provider. SEEK IMMEDIATE MEDICAL CARE IF:   You have chest pain.  You have extreme difficulty breathing or shortness of breath.  You develop a very fast heartbeat that persists.  You develop dizziness that does not go away.  You faint or constantly feel you are about to faint.   This information is not intended to replace advice given to you by your health care provider. Make sure you discuss any questions you have with your health care provider.   Document Released: 10/20/2007 Document Revised: 01/31/2014 Document Reviewed: 07/09/2012 Elsevier Interactive Patient Education [employer].
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
## 2015-09-19 ENCOUNTER — Encounter (INDEPENDENT_AMBULATORY_CARE_PROVIDER_SITE_OTHER): Payer: 59

## 2015-09-19 DIAGNOSIS — R55 Syncope and collapse: Secondary | ICD-10-CM | POA: Diagnosis not present

## 2015-09-19 DIAGNOSIS — R079 Chest pain, unspecified: Secondary | ICD-10-CM

## 2015-09-19 DIAGNOSIS — R0602 Shortness of breath: Secondary | ICD-10-CM

## 2015-11-05 ENCOUNTER — Ambulatory Visit (INDEPENDENT_AMBULATORY_CARE_PROVIDER_SITE_OTHER): Payer: 59 | Admitting: Cardiovascular Disease

## 2015-11-05 ENCOUNTER — Encounter: Payer: Self-pay | Admitting: Cardiovascular Disease

## 2015-11-05 VITALS — BP 146/88 | HR 83 | Ht 65.0 in | Wt 209.5 lb

## 2015-11-05 DIAGNOSIS — E1165 Type 2 diabetes mellitus with hyperglycemia: Secondary | ICD-10-CM

## 2015-11-05 DIAGNOSIS — I10 Essential (primary) hypertension: Secondary | ICD-10-CM | POA: Diagnosis not present

## 2015-11-05 DIAGNOSIS — I456 Pre-excitation syndrome: Secondary | ICD-10-CM | POA: Diagnosis not present

## 2015-11-05 DIAGNOSIS — R0602 Shortness of breath: Secondary | ICD-10-CM

## 2015-11-05 DIAGNOSIS — E7800 Pure hypercholesterolemia, unspecified: Secondary | ICD-10-CM

## 2015-11-05 LAB — HM DIABETES EYE EXAM
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
## 2015-11-05 NOTE — Addendum Note (Signed)
Addended by: Britt Bottom on: 11/05/2015 04:17 PM   Modules accepted: Orders

## 2015-11-05 NOTE — Patient Instructions (Signed)
Medication Instructions:   No medication changes made  Labwork:  No new labs needed  Testing/Procedures:  No further testing at this time   Follow-Up: It was a pleasure seeing you in the office today. Please call us if you have new issues that need to be addressed before your next appt.  [phone]  Your physician wants you to follow-up in:  As needed  If you need a refill on your cardiac medications before your next appointment, please call your pharmacy.     

## 2015-11-18 ENCOUNTER — Encounter: Payer: Self-pay | Admitting: Family Medicine

## 2016-03-30 ENCOUNTER — Telehealth: Payer: Self-pay | Admitting: Family Medicine

## 2016-03-30 DIAGNOSIS — E119 Type 2 diabetes mellitus without complications: Secondary | ICD-10-CM

## 2016-03-30 NOTE — Telephone Encounter (Signed)
Patient would like a Referral to Dr Cruzita Lederer at [facility], she was referred previously but couldn't go. Please place Referral

## 2016-03-30 NOTE — Telephone Encounter (Signed)
Done Will route to [facility] 

## 2016-05-10 ENCOUNTER — Ambulatory Visit (INDEPENDENT_AMBULATORY_CARE_PROVIDER_SITE_OTHER): Payer: 59 | Admitting: Internal Medicine

## 2016-05-10 ENCOUNTER — Encounter: Payer: Self-pay | Admitting: Internal Medicine

## 2016-05-10 VITALS — BP 138/82 | HR 76 | Temp 97.2°F | Wt 189.8 lb

## 2016-05-10 DIAGNOSIS — E1165 Type 2 diabetes mellitus with hyperglycemia: Secondary | ICD-10-CM

## 2016-05-10 LAB — POCT GLYCOSYLATED HEMOGLOBIN (HGB A1C): Hemoglobin A1C: 8.6

## 2016-05-10 MED ORDER — METFORMIN HCL ER 500 MG PO TB24: 500.0000 mg | ORAL_TABLET | Freq: Two times a day (BID) | ORAL | 5 refills | Status: DC
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
## 2016-05-10 NOTE — Patient Instructions (Addendum)
Please start Metformin ER 500 mg with dinner x 4 days. If you tolerate this well, increase the dose to 500 mg of metformin 2x a day with breakfast and dinner.  Please return in 3 months with your sugar log.   PATIENT INSTRUCTIONS FOR TYPE 2 DIABETES:  **Please join MyChart!** - see attached instructions about how to join if you have not done so already.  DIET AND EXERCISE Diet and exercise is an important part of diabetic treatment.  We recommended aerobic exercise in the form of brisk walking (working between 40-60% of maximal aerobic capacity, similar to brisk walking) for 150 minutes per week (such as 30 minutes five days per week) along with 3 times per week performing 'resistance' training (using various gauge rubber tubes with handles) 5-10 exercises involving the major muscle groups (upper body, lower body and core) performing 10-15 repetitions (or near fatigue) each exercise. Start at half the above goal but build slowly to reach the above goals. If limited by weight, joint pain, or disability, we recommend daily walking in a swimming pool with water up to waist to reduce pressure from joints while allow for adequate exercise.    BLOOD GLUCOSES Monitoring your blood glucoses is important for continued management of your diabetes. Please check your blood glucoses 2-4 times a day: fasting, before meals and at bedtime (you can rotate these measurements - e.g. one day check before the 3 meals, the next day check before 2 of the meals and before bedtime, etc.).   HYPOGLYCEMIA (low blood sugar) Hypoglycemia is usually a reaction to not eating, exercising, or taking too much insulin/ other diabetes drugs.  Symptoms include tremors, sweating, hunger, confusion, headache, etc. Treat IMMEDIATELY with 15 grams of Carbs: . 4 glucose tablets .  cup regular juice/soda . 2 tablespoons raisins . 4 teaspoons sugar . 1 tablespoon honey Recheck blood glucose in 15 mins and repeat above if still  symptomatic/blood glucose <100.  RECOMMENDATIONS TO REDUCE YOUR RISK OF DIABETIC COMPLICATIONS: * Take your prescribed MEDICATION(S) * Follow a DIABETIC diet: Complex carbs, fiber rich foods, (monounsaturated and polyunsaturated) fats * AVOID saturated/trans fats, high fat foods, >2,300 mg salt per day. * EXERCISE at least 5 times a week for 30 minutes or preferably daily.  * DO NOT SMOKE OR DRINK more than 1 drink a day. * Check your FEET every day. Do not wear tightfitting shoes. Contact us if you develop an ulcer * See your EYE doctor once a year or more if needed * Get a FLU shot once a year * Get a PNEUMONIA vaccine once before and once after age 40 years  GOALS:  * Your Hemoglobin A1c of <7%  * fasting sugars need to be <130 * after meals sugars need to be <180 (2h after you start eating) * Your Systolic BP should be 532 or lower  * Your Diastolic BP should be 80 or lower  * Your HDL (Good Cholesterol) should be 40 or higher  * Your LDL (Bad Cholesterol) should be 100 or lower. * Your Triglycerides should be 150 or lower  * Your Urine microalbumin (kidney function) should be <30 * Your Body Mass Index should be 25 or lower    Please consider the following ways to cut down carbs and fat and increase fiber and micronutrients in your diet: - substitute whole grain for white bread or pasta - substitute brown rice for white rice - substitute 90-calorie flat bread pieces for slices of bread when possible -  substitute sweet potatoes or yams for white potatoes - substitute humus for margarine - substitute tofu for cheese when possible - substitute almond or rice milk for regular milk (would not drink soy milk daily due to concern for soy estrogen influence on breast cancer risk) - substitute dark chocolate for other sweets when possible - substitute water - can add lemon or orange slices for taste - for diet sodas (artificial sweeteners will trick your body that you can eat sweets  without getting calories and will lead you to overeating and weight gain in the long run) - do not skip breakfast or other meals (this will slow down the metabolism and will result in more weight gain over time)  - can try smoothies made from fruit and almond/rice milk in am instead of regular breakfast - can also try old-fashioned (not instant) oatmeal made with almond/rice milk in am - order the dressing on the side when eating salad at a restaurant (pour less than half of the dressing on the salad) - eat as [place] meat as possible - can try juicing, but should not forget that juicing will get rid of the fiber, so would alternate with eating raw veg./fruits or drinking smoothies - use as [place] oil as possible, even when using olive oil - can dress a salad with a mix of balsamic vinegar and lemon juice, for e.g. - use agave nectar, stevia sugar, or regular sugar rather than artificial sweateners - steam or broil/roast veggies  - snack on veggies/fruit/nuts (unsalted, preferably) when possible, rather than processed foods - reduce or eliminate aspartame in diet (it is in diet sodas, chewing gum, etc) Read the labels!  Try to read Dr. Janene Harvey book: "Program for Reversing Diabetes" for other ideas for healthy eating.

## 2016-05-23 ENCOUNTER — Encounter: Payer: Self-pay | Admitting: Family Medicine

## 2016-05-23 ENCOUNTER — Ambulatory Visit (INDEPENDENT_AMBULATORY_CARE_PROVIDER_SITE_OTHER): Payer: 59 | Admitting: Family Medicine

## 2016-05-23 VITALS — BP 120/86 | HR 77 | Temp 98.4°F | Ht 64.5 in | Wt 187.5 lb

## 2016-05-23 DIAGNOSIS — Z9114 Patient's other noncompliance with medication regimen: Secondary | ICD-10-CM

## 2016-05-23 DIAGNOSIS — I10 Essential (primary) hypertension: Secondary | ICD-10-CM | POA: Diagnosis not present

## 2016-05-23 DIAGNOSIS — E1165 Type 2 diabetes mellitus with hyperglycemia: Secondary | ICD-10-CM

## 2016-05-23 DIAGNOSIS — F419 Anxiety disorder, unspecified: Secondary | ICD-10-CM | POA: Diagnosis not present

## 2016-05-23 DIAGNOSIS — E6609 Other obesity due to excess calories: Secondary | ICD-10-CM

## 2016-05-23 DIAGNOSIS — R079 Chest pain, unspecified: Secondary | ICD-10-CM | POA: Diagnosis not present

## 2016-05-23 DIAGNOSIS — Z6831 Body mass index (BMI) 31.0-31.9, adult: Secondary | ICD-10-CM | POA: Diagnosis not present

## 2016-05-23 DIAGNOSIS — Z0000 Encounter for general adult medical examination without abnormal findings: Secondary | ICD-10-CM

## 2016-05-23 DIAGNOSIS — I456 Pre-excitation syndrome: Secondary | ICD-10-CM

## 2016-05-23 DIAGNOSIS — E7800 Pure hypercholesterolemia, unspecified: Secondary | ICD-10-CM | POA: Diagnosis not present

## 2016-05-23 DIAGNOSIS — Z0001 Encounter for general adult medical examination with abnormal findings: Secondary | ICD-10-CM

## 2016-05-23 DIAGNOSIS — E039 Hypothyroidism, unspecified: Secondary | ICD-10-CM

## 2016-05-23 DIAGNOSIS — Z1211 Encounter for screening for malignant neoplasm of colon: Secondary | ICD-10-CM

## 2016-05-23 DIAGNOSIS — E538 Deficiency of other specified B group vitamins: Secondary | ICD-10-CM | POA: Diagnosis not present

## 2016-05-23 LAB — CBC WITH DIFFERENTIAL/PLATELET
BASOS PCT: 0.6 % (ref 0.0–3.0)
Basophils Absolute: 0 10*3/uL (ref 0.0–0.1)
EOS ABS: 0.1 10*3/uL (ref 0.0–0.7)
EOS PCT: 1.1 % (ref 0.0–5.0)
HCT: 39 % (ref 36.0–46.0)
Hemoglobin: 13.2 g/dL (ref 12.0–15.0)
LYMPHS ABS: 2.3 10*3/uL (ref 0.7–4.0)
Lymphocytes Relative: 29.4 % (ref 12.0–46.0)
MCHC: 33.8 g/dL (ref 30.0–36.0)
MCV: 90.6 fl (ref 78.0–100.0)
MONO ABS: 0.6 10*3/uL (ref 0.1–1.0)
Monocytes Relative: 7.3 % (ref 3.0–12.0)
NEUTROS PCT: 61.6 % (ref 43.0–77.0)
Neutro Abs: 4.8 10*3/uL (ref 1.4–7.7)
Platelets: 317 10*3/uL (ref 150.0–400.0)
RBC: 4.31 Mil/uL (ref 3.87–5.11)
RDW: 12.7 % (ref 11.5–15.5)
WBC: 7.9 10*3/uL (ref 4.0–10.5)

## 2016-05-23 LAB — LIPID PANEL
CHOL/HDL RATIO: 4
Cholesterol: 256 mg/dL — ABNORMAL HIGH (ref 0–200)
HDL: 66 mg/dL (ref >39.00)
LDL CALC: 163 mg/dL — AB (ref 0–99)
NONHDL: 190.32
Triglycerides: 136 mg/dL (ref 0.0–149.0)
VLDL: 27.2 mg/dL (ref 0.0–40.0)

## 2016-05-23 LAB — COMPREHENSIVE METABOLIC PANEL
ALT: 20 U/L (ref 0–35)
AST: 15 U/L (ref 0–37)
Albumin: 4.2 g/dL (ref 3.5–5.2)
Alkaline Phosphatase: 71 U/L (ref 39–117)
BUN: 11 mg/dL (ref 6–23)
CHLORIDE: 103 meq/L (ref 96–112)
CO2: 27 meq/L (ref 19–32)
Calcium: 9.9 mg/dL (ref 8.4–10.5)
Creatinine, Ser: 0.59 mg/dL (ref 0.40–1.20)
GFR: 111.06 mL/min (ref >60.00)
GLUCOSE: 191 mg/dL — AB (ref 70–99)
POTASSIUM: 3.8 meq/L (ref 3.5–5.1)
SODIUM: 138 meq/L (ref 135–145)
Total Bilirubin: 0.7 mg/dL (ref 0.2–1.2)
Total Protein: 7 g/dL (ref 6.0–8.3)

## 2016-05-23 LAB — TSH: TSH: 0.85 u[IU]/mL (ref 0.35–4.50)

## 2016-05-23 LAB — VITAMIN B12: Vitamin B-12: 403 pg/mL (ref 211–911)

## 2016-05-23 NOTE — Assessment & Plan Note (Signed)
Declines colonoscopy Agreed to IFOB kit

## 2016-05-23 NOTE — Patient Instructions (Addendum)
If change your mind- I still think metformin is a good idea- doubt you will have any side effects  We will get you set up with cardiology and podiatry and psychology    Please start taking your B12 -it is very important  If your level remains low - you could have irreversible nerve damage   Eat a healthy balanced diet   Do the ifob stool kit for colon screening   For cholesterol    Avoid red meat/ fried foods/ egg yolks/ fatty breakfast meats/ butter, cheese and high fat dairy/ and shellfish      

## 2016-05-23 NOTE — Assessment & Plan Note (Signed)
Pt went off B12 for a while stating it did not make her feel better  Explained that low B12 can cause fatigue/weakness and neurologic effects She agrees to stay on it  Level today

## 2016-05-23 NOTE — Assessment & Plan Note (Signed)
Disc goals for lipids and reasons to control them Rev labs with pt (last check) Lipid panel today  Referring to cardiology for chest pain as well  She declines any treatment whatsoever -especially statins  Rev low sat fat diet in detail
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
## 2016-05-23 NOTE — Assessment & Plan Note (Signed)
Pt c/o of precordial chest pain -worse lately / at night and sometimes during exertion  In addition to generalized weakness and sob on exertion  At one time attrib to anxiety  She is worried about heart health Hx of DM and high cholesterol- declines treatment for both  Will return to cardiology -ref done inst to get to the ED if symptoms return or worsen

## 2016-05-23 NOTE — Assessment & Plan Note (Signed)
bp in fair control at this time  BP Readings from Last 1 Encounters:  05/23/16 120/86   No changes needed-bp is stable with no medicines now that she has lost wt Disc lifstyle change with low sodium diet and exercise

## 2016-05-23 NOTE — Assessment & Plan Note (Addendum)
TSH today  Wt loss and feeling of general weakness noted

## 2016-05-23 NOTE — Assessment & Plan Note (Signed)
Per pt -her rhythm has been stable lately

## 2016-05-23 NOTE — Assessment & Plan Note (Signed)
BMI is 31.6 now  Wt loss noted  Eating less-per pt due to dietary intolerances   Discussed how this problem influences overall health and the risks it imposes  Reviewed plan for weight loss with lower calorie diet (via better food choices and also portion control or program like weight watchers) and exercise building up to or more than 30 minutes 5 days per week including some aerobic activity
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
## 2016-05-23 NOTE — Assessment & Plan Note (Signed)
Seeing Dr Cruzita Lederer  Will not take metformin because of fear of side effects (tried to reason with her)  Wt loss noted  Lab Results  Component Value Date   HGBA1C 8.6 05/10/2016
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
## 2016-05-23 NOTE — Progress Notes (Signed)
Pre visit review using our clinic review tool, if applicable. No additional management support is needed unless otherwise documented below in the visit note. 

## 2016-05-24 ENCOUNTER — Encounter: Payer: Self-pay | Admitting: *Deleted

## 2016-06-01 ENCOUNTER — Telehealth: Payer: Self-pay | Admitting: *Deleted

## 2016-06-01 MED ORDER — SYNTHROID 50 MCG PO TABS: 50.0000 ug | ORAL_TABLET | Freq: Every day | ORAL | 3 refills | Status: DC

## 2016-06-01 NOTE — Telephone Encounter (Signed)
Pt wanted to see if Dr. Glori Bickers is okay changing levothyroxine to the name brand Synthroid, she said mother's endo doc told her it might help her not have any side eff of med and that the name brand works better so she would like to change to name brand, please advise

## 2016-06-01 NOTE — Telephone Encounter (Signed)
That is fine with me as long as she knows it may be more $$  Sent to pharmacy
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
## 2016-06-06 ENCOUNTER — Encounter: Payer: Self-pay | Admitting: Cardiovascular Disease

## 2016-06-06 ENCOUNTER — Ambulatory Visit (INDEPENDENT_AMBULATORY_CARE_PROVIDER_SITE_OTHER): Payer: 59 | Admitting: Cardiovascular Disease

## 2016-06-06 VITALS — BP 122/68 | HR 81 | Ht 64.5 in | Wt 187.5 lb

## 2016-06-06 DIAGNOSIS — E782 Mixed hyperlipidemia: Secondary | ICD-10-CM | POA: Diagnosis not present

## 2016-06-06 DIAGNOSIS — R079 Chest pain, unspecified: Secondary | ICD-10-CM | POA: Diagnosis not present

## 2016-06-06 DIAGNOSIS — I10 Essential (primary) hypertension: Secondary | ICD-10-CM | POA: Diagnosis not present

## 2016-06-06 DIAGNOSIS — R0602 Shortness of breath: Secondary | ICD-10-CM

## 2016-06-06 DIAGNOSIS — E1165 Type 2 diabetes mellitus with hyperglycemia: Secondary | ICD-10-CM | POA: Diagnosis not present

## 2016-06-06 NOTE — Patient Instructions (Addendum)
Medication Instructions:   No medication changes made  Labwork:  No new labs needed  Testing/Procedures:  We will schedule a treadmill [facility]  Your caregiver has ordered a Stress Test with nuclear imaging. The purpose of this test is to evaluate the blood supply to your heart muscle. This procedure is referred to as a "Non-Invasive Stress Test." This is because other than having an IV started in your vein, nothing is inserted or "invades" your body. Cardiac stress tests are done to find areas of poor blood flow to the heart by determining the extent of coronary artery disease (CAD). Some patients exercise on a treadmill, which naturally increases the blood flow to your heart, while others who are  unable to walk on a treadmill due to physical limitations have a pharmacologic/chemical stress agent called Lexiscan . This medicine will mimic walking on a treadmill by temporarily increasing your coronary blood flow.   Please note: these test may take anywhere between 2-4 hours to complete  PLEASE REPORT TO [facility] AT THE FIRST DESK WILL DIRECT YOU WHERE TO GO  Date of Procedure:______Monday, May 21____  Arrival Time for Procedure:____7:15 am______  Instructions regarding medication:   __X__ : Hold diabetes medication morning of procedure  How to prepare for your Myoview test:  1. Do not eat or drink after midnight 2. No caffeine for 24 hours prior to test 3. No smoking 24 hours prior to test. 4. Your medication may be taken with water.  If your doctor stopped a medication because of this test, do not take that medication. 5. Ladies, please do not wear dresses.  Skirts or pants are appropriate. Please wear a short sleeve shirt. 6. No perfume, cologne or lotion  Follow-Up: It was a pleasure seeing you in the office today. Please call us if you have new issues that need to be addressed before your next appt.  [phone]  Your  physician wants you to follow-up in: 12 months.  You will receive a reminder letter in the mail two months in advance. If you don't receive a letter, please call our office to schedule the follow-up appointment.  If you need a refill on your cardiac medications before your next appointment, please call your pharmacy.    Pharmacologic Stress Electrocardiogram Introduction A pharmacologic stress electrocardiogram is a heart (cardiac) test that uses nuclear imaging to evaluate the blood supply to your heart. This test may also be called a pharmacologic stress electrocardiography. Pharmacologic means that a medicine is used to increase your heart rate and blood pressure. This stress test is done to find areas of poor blood flow to the heart by determining the extent of coronary artery disease (CAD). Some people exercise on a treadmill, which naturally increases the blood flow to the heart. For those people unable to exercise on a treadmill, a medicine is used. This medicine stimulates your heart and will cause your heart to beat harder and more quickly, as if you were exercising. Pharmacologic stress tests can help determine:  The adequacy of blood flow to your heart during increased levels of activity in order to clear you for discharge home.  The extent of coronary artery blockage caused by CAD.  Your prognosis if you have suffered a heart attack.  The effectiveness of cardiac procedures done, such as an angioplasty, which can increase the circulation in your coronary arteries.  Causes of chest pain or pressure. LET [facility] CARE PROVIDER KNOW ABOUT:  Any allergies you have.  All medicines you are taking, including vitamins, herbs, eye drops, creams, and over-the-counter medicines.  Previous problems you or members of your family have had with the use of anesthetics.  Any blood disorders you have.  Previous surgeries you have had.  Medical conditions you have.  Possibility of  pregnancy, if this applies.  If you are currently breastfeeding. RISKS AND COMPLICATIONS Generally, this is a safe procedure. However, as with any procedure, complications can occur. Possible complications include:  You develop pain or pressure in the following areas:  Chest.  Jaw or neck.  Between your shoulder blades.  Radiating down your left arm.  Headache.  Dizziness or light-headedness.  Shortness of breath.  Increased or irregular heartbeat.  Low blood pressure.  Nausea or vomiting.  Flushing.  Redness going up the arm and slight pain during injection of medicine.  Heart attack (rare). BEFORE THE PROCEDURE  Avoid all forms of caffeine for 24 hours before your test or as directed by your health care provider. This includes coffee, tea (even decaffeinated tea), caffeinated sodas, chocolate, cocoa, and certain pain medicines.  Follow your health care provider's instructions regarding eating and drinking before the test.  Take your medicines as directed at regular times with water unless instructed otherwise. Exceptions may include:  If you have diabetes, ask how you are to take your insulin or pills. It is common to adjust insulin dosing the morning of the test.  If you are taking beta-blocker medicines, it is important to talk to your health care provider about these medicines well before the date of your test. Taking beta-blocker medicines may interfere with the test. In some cases, these medicines need to be changed or stopped 24 hours or more before the test.  If you wear a nitroglycerin patch, it may need to be removed prior to the test. Ask your health care provider if the patch should be removed before the test.  If you use an inhaler for any breathing condition, bring it with you to the test.  If you are an outpatient, bring a snack so you can eat right after the stress phase of the test.  Do not smoke for 4 hours prior to the test or as directed by your  health care provider.  Do not apply lotions, powders, creams, or oils on your chest prior to the test.  Wear comfortable shoes and clothing. Let your health care provider know if you were unable to complete or follow the preparations for your test. PROCEDURE  Multiple patches (electrodes) will be put on your chest. If needed, small areas of your chest may be shaved to get better contact with the electrodes. Once the electrodes are attached to your body, multiple wires will be attached to the electrodes, and your heart rate will be monitored.  An IV access will be started. A nuclear trace (isotope) is given. The isotope may be given intravenously, or it may be swallowed. Nuclear refers to several types of radioactive isotopes, and the nuclear isotope lights up the arteries so that the nuclear images are clear. The isotope is absorbed by your body. This results in low radiation exposure.  A resting nuclear image is taken to show how your heart functions at rest.  A medicine is given through the IV access.  A second scan is done about 1 hour after the medicine injection and determines how your heart functions under stress.  During this stress phase, you will be connected to  an electrocardiogram machine. Your blood pressure and oxygen levels will be monitored. What to expect after the procedure  Your heart rate and blood pressure will be monitored after the test.  You may return to your normal schedule, including diet,activities, and medicines, unless your health care provider tells you otherwise. This information is not intended to replace advice given to you by your health care provider. Make sure you discuss any questions you have with your health care provider. Document Released: 05/29/2008 Document Revised: 06/18/2015 Document Reviewed: 07/20/2015 Elsevier Interactive Patient Education  2017 [employer].

## 2016-06-13 ENCOUNTER — Ambulatory Visit
Admission: RE | Admit: 2016-06-13 | Discharge: 2016-06-13 | Disposition: A | Discharge status: Home / Self Care | Payer: 59 | Source: Ambulatory Visit | Attending: Cardiovascular Disease | Admitting: Cardiovascular Disease

## 2016-06-13 DIAGNOSIS — R0602 Shortness of breath: Secondary | ICD-10-CM | POA: Insufficient documentation

## 2016-06-13 DIAGNOSIS — R079 Chest pain, unspecified: Secondary | ICD-10-CM | POA: Diagnosis not present

## 2016-06-13 DIAGNOSIS — I10 Essential (primary) hypertension: Secondary | ICD-10-CM | POA: Diagnosis not present

## 2016-06-13 LAB — NM MYOCAR MULTI W/SPECT W/WALL MOTION / EF
CHL CUP NUCLEAR SDS: 1
CHL CUP NUCLEAR SRS: 2
CHL CUP NUCLEAR SSS: 1
CHL CUP RESTING HR STRESS: 63 {beats}/min
CSEPPHR: 102 {beats}/min
LV dias vol: 74 mL (ref 46–106)
LVSYSVOL: 24 mL
Percent HR: 62 %
TID: 1.17

## 2016-06-13 MED ORDER — TECHNETIUM TC 99M TETROFOSMIN IV KIT
13.0000 | PACK | Freq: Once | INTRAVENOUS | Status: AC | PRN
  Administered 2016-06-13: 10.957 via INTRAVENOUS

## 2016-06-13 MED ORDER — REGADENOSON 0.4 MG/5ML IV SOLN
0.4000 mg | Freq: Once | INTRAVENOUS | Status: AC
  Administered 2016-06-13: 0.4 mg via INTRAVENOUS

## 2016-06-13 MED ORDER — TECHNETIUM TC 99M TETROFOSMIN IV KIT
31.2550 | PACK | Freq: Once | INTRAVENOUS | Status: AC | PRN
  Administered 2016-06-13: 31.255 via INTRAVENOUS

## 2016-06-24 ENCOUNTER — Ambulatory Visit: Payer: Self-pay | Admitting: Podiatry

## 2016-08-04 ENCOUNTER — Other Ambulatory Visit: Payer: Self-pay | Admitting: *Deleted

## 2016-08-04 MED ORDER — LEVOTHYROXINE SODIUM 50 MCG PO TABS: 50.0000 ug | ORAL_TABLET | Freq: Every day | ORAL | 1 refills | Status: DC

## 2017-05-26 ENCOUNTER — Ambulatory Visit (INDEPENDENT_AMBULATORY_CARE_PROVIDER_SITE_OTHER): Payer: 59 | Admitting: Family Medicine

## 2017-05-26 ENCOUNTER — Encounter: Payer: Self-pay | Admitting: Family Medicine

## 2017-05-26 VITALS — BP 122/84 | HR 74 | Temp 98.2°F | Ht 64.0 in | Wt 183.5 lb

## 2017-05-26 DIAGNOSIS — I10 Essential (primary) hypertension: Secondary | ICD-10-CM

## 2017-05-26 DIAGNOSIS — E039 Hypothyroidism, unspecified: Secondary | ICD-10-CM | POA: Diagnosis not present

## 2017-05-26 DIAGNOSIS — E1165 Type 2 diabetes mellitus with hyperglycemia: Secondary | ICD-10-CM | POA: Diagnosis not present

## 2017-05-26 DIAGNOSIS — I456 Pre-excitation syndrome: Secondary | ICD-10-CM

## 2017-05-26 DIAGNOSIS — E6609 Other obesity due to excess calories: Secondary | ICD-10-CM | POA: Diagnosis not present

## 2017-05-26 DIAGNOSIS — E538 Deficiency of other specified B group vitamins: Secondary | ICD-10-CM

## 2017-05-26 DIAGNOSIS — E782 Mixed hyperlipidemia: Secondary | ICD-10-CM | POA: Diagnosis not present

## 2017-05-26 DIAGNOSIS — Z23 Encounter for immunization: Secondary | ICD-10-CM

## 2017-05-26 DIAGNOSIS — Z0000 Encounter for general adult medical examination without abnormal findings: Secondary | ICD-10-CM | POA: Diagnosis not present

## 2017-05-26 DIAGNOSIS — Z6831 Body mass index (BMI) 31.0-31.9, adult: Secondary | ICD-10-CM | POA: Diagnosis not present

## 2017-05-26 DIAGNOSIS — F419 Anxiety disorder, unspecified: Secondary | ICD-10-CM

## 2017-05-26 LAB — LIPID PANEL
CHOL/HDL RATIO: 3
Cholesterol: 268 mg/dL — ABNORMAL HIGH (ref 0–200)
HDL: 83.3 mg/dL (ref >39.00)
LDL CALC: 163 mg/dL — AB (ref 0–99)
NonHDL: 184.49
Triglycerides: 109 mg/dL (ref 0.0–149.0)
VLDL: 21.8 mg/dL (ref 0.0–40.0)

## 2017-05-26 LAB — COMPREHENSIVE METABOLIC PANEL
ALT: 14 U/L (ref 0–35)
AST: 13 U/L (ref 0–37)
Albumin: 4.4 g/dL (ref 3.5–5.2)
Alkaline Phosphatase: 68 U/L (ref 39–117)
BUN: 14 mg/dL (ref 6–23)
CHLORIDE: 101 meq/L (ref 96–112)
CO2: 24 meq/L (ref 19–32)
CREATININE: 0.62 mg/dL (ref 0.40–1.20)
Calcium: 9.8 mg/dL (ref 8.4–10.5)
GFR: 104.52 mL/min (ref >60.00)
Glucose, Bld: 163 mg/dL — ABNORMAL HIGH (ref 70–99)
POTASSIUM: 3.7 meq/L (ref 3.5–5.1)
SODIUM: 140 meq/L (ref 135–145)
Total Bilirubin: 0.7 mg/dL (ref 0.2–1.2)
Total Protein: 7.2 g/dL (ref 6.0–8.3)

## 2017-05-26 LAB — CBC WITH DIFFERENTIAL/PLATELET
BASOS PCT: 0.6 % (ref 0.0–3.0)
Basophils Absolute: 0 10*3/uL (ref 0.0–0.1)
EOS ABS: 0.1 10*3/uL (ref 0.0–0.7)
EOS PCT: 0.7 % (ref 0.0–5.0)
HCT: 39.2 % (ref 36.0–46.0)
Hemoglobin: 13.2 g/dL (ref 12.0–15.0)
Lymphocytes Relative: 25.9 % (ref 12.0–46.0)
Lymphs Abs: 2 10*3/uL (ref 0.7–4.0)
MCHC: 33.8 g/dL (ref 30.0–36.0)
MCV: 92.4 fl (ref 78.0–100.0)
MONO ABS: 0.6 10*3/uL (ref 0.1–1.0)
Monocytes Relative: 7.2 % (ref 3.0–12.0)
NEUTROS ABS: 5 10*3/uL (ref 1.4–7.7)
Neutrophils Relative %: 65.6 % (ref 43.0–77.0)
PLATELETS: 293 10*3/uL (ref 150.0–400.0)
RBC: 4.25 Mil/uL (ref 3.87–5.11)
RDW: 13.5 % (ref 11.5–15.5)
WBC: 7.7 10*3/uL (ref 4.0–10.5)

## 2017-05-26 LAB — MICROALBUMIN / CREATININE URINE RATIO
CREATININE, U: 239.4 mg/dL
MICROALB/CREAT RATIO: 1.2 mg/g (ref 0.0–30.0)
Microalb, Ur: 2.9 mg/dL — ABNORMAL HIGH (ref 0.0–1.9)

## 2017-05-26 LAB — TSH: TSH: 1.48 u[IU]/mL (ref 0.35–4.50)

## 2017-05-26 LAB — HEMOGLOBIN A1C: HEMOGLOBIN A1C: 7.5 % — AB (ref 4.6–6.5)

## 2017-05-26 LAB — VITAMIN B12: Vitamin B-12: 155 pg/mL — ABNORMAL LOW (ref 211–911)

## 2017-05-26 NOTE — Patient Instructions (Addendum)
You really need fruits and vegetables for health  You may get malnourished  Perhaps smoothie  Get a multi vitamin -perhaps children's chewable   Tdap vaccine today   If you change your mind about treatment of any chronic conditions or cancer screening let us know   We will work on a counseling referral

## 2017-05-26 NOTE — Assessment & Plan Note (Signed)
Well controlled  No medication  bp in fair control at this time  BP Readings from Last 1 Encounters:  05/26/17 122/84   Cannot have ace due to hx of anaphylaxis with shellfish Disc lifstyle change with low sodium diet and exercise

## 2017-05-26 NOTE — Assessment & Plan Note (Signed)
Pt has declined monitoring or treatment of any kind  Thinks with wt loss it will be better  Toe nails are severely overgrown- declines podiatry referral  Ref to opthy for DM eye exam  A1C today  Diet is not optimal - she refuses to eat any fruits or vegetables
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
## 2017-05-26 NOTE — Assessment & Plan Note (Signed)
Disc goals for lipids and reasons to control them Rev labs with pt  (last time)  Rev low sat fat diet in detail  Diet is sub optimal due to fear of certain foods   Lab today  Declines tx of any kind

## 2017-05-26 NOTE — Assessment & Plan Note (Signed)
Discussed how this problem influences overall health and the risks it imposes  Reviewed plan for weight loss with lower calorie diet (via better food choices and also portion control or program like weight watchers) and exercise building up to or more than 30 minutes 5 days per week including some aerobic activity   Pt walks to mailbox-this is all the exercise she will do  Has lost weight due to anxiety/severe fear of certain foods and poss ? Out of control DM A1C today

## 2017-05-26 NOTE — Assessment & Plan Note (Signed)
Stopped B12 Unbalanced diet  Lab today

## 2017-05-26 NOTE — Assessment & Plan Note (Signed)
No symptoms Pt declines f/u with cardiology at this time

## 2017-05-26 NOTE — Assessment & Plan Note (Signed)
TSH today  Takes levothy once per week  "there is nothing wrong with my thyroid" Will check and see what she needs

## 2017-05-26 NOTE — Assessment & Plan Note (Signed)
Reviewed health habits including diet and exercise and skin cancer prevention Reviewed appropriate screening tests for age  Also reviewed health mt list, fam hx and immunization status , as well as social and family history   See HPI Needs form filled out for work  Declines almost all health mt but will get Tdap today

## 2017-05-31 ENCOUNTER — Telehealth: Payer: Self-pay | Admitting: Family Medicine

## 2017-05-31 NOTE — Telephone Encounter (Signed)
Copied from [place] [id number]. Topic: Quick Communication - Lab Results >> May 30, 2017  4:38 PM Tammi Sou, [place] wrote: Called patient to inform them of lab results. When patient returns call, triage nurse may disclose results.

## 2017-06-08 ENCOUNTER — Ambulatory Visit (INDEPENDENT_AMBULATORY_CARE_PROVIDER_SITE_OTHER): Payer: 59 | Admitting: *Deleted

## 2017-06-08 ENCOUNTER — Ambulatory Visit (INDEPENDENT_AMBULATORY_CARE_PROVIDER_SITE_OTHER): Payer: 59 | Admitting: Psychology

## 2017-06-08 DIAGNOSIS — E538 Deficiency of other specified B group vitamins: Secondary | ICD-10-CM | POA: Diagnosis not present

## 2017-06-08 DIAGNOSIS — F4310 Post-traumatic stress disorder, unspecified: Secondary | ICD-10-CM | POA: Diagnosis not present

## 2017-06-08 MED ORDER — CYANOCOBALAMIN 1000 MCG/ML IJ SOLN
1000.0000 ug | Freq: Once | INTRAMUSCULAR | Status: AC
  Administered 2017-06-08: 1000 ug via INTRAMUSCULAR

## 2017-07-20 ENCOUNTER — Ambulatory Visit: Payer: 59 | Admitting: Psychology

## 2017-07-31 LAB — HM DIABETES EYE EXAM

## 2017-08-17 ENCOUNTER — Encounter: Payer: Self-pay | Admitting: *Deleted

## 2018-06-08 ENCOUNTER — Encounter: Payer: 59 | Admitting: Family Medicine

## 2018-11-03 ENCOUNTER — Emergency Department (HOSPITAL_COMMUNITY): Payer: Self-pay

## 2018-11-03 ENCOUNTER — Inpatient Hospital Stay (HOSPITAL_COMMUNITY): Payer: Self-pay

## 2018-11-03 ENCOUNTER — Observation Stay (HOSPITAL_COMMUNITY): Payer: Self-pay

## 2018-11-03 ENCOUNTER — Inpatient Hospital Stay (HOSPITAL_COMMUNITY)
Admission: EM | Admit: 2018-11-03 | Discharge: 2019-02-02 | DRG: 637 | Disposition: A | Discharge status: Home Health | Payer: Self-pay | Attending: Internal Medicine | Admitting: Internal Medicine

## 2018-11-03 ENCOUNTER — Encounter (HOSPITAL_COMMUNITY): Payer: Self-pay

## 2018-11-03 ENCOUNTER — Other Ambulatory Visit: Payer: Self-pay

## 2018-11-03 DIAGNOSIS — I6340 Cerebral infarction due to embolism of unspecified cerebral artery: Secondary | ICD-10-CM | POA: Diagnosis present

## 2018-11-03 DIAGNOSIS — R112 Nausea with vomiting, unspecified: Secondary | ICD-10-CM

## 2018-11-03 DIAGNOSIS — Z79899 Other long term (current) drug therapy: Secondary | ICD-10-CM

## 2018-11-03 DIAGNOSIS — Z91018 Allergy to other foods: Secondary | ICD-10-CM

## 2018-11-03 DIAGNOSIS — F419 Anxiety disorder, unspecified: Secondary | ICD-10-CM | POA: Diagnosis present

## 2018-11-03 DIAGNOSIS — E1165 Type 2 diabetes mellitus with hyperglycemia: Secondary | ICD-10-CM | POA: Diagnosis present

## 2018-11-03 DIAGNOSIS — I6330 Cerebral infarction due to thrombosis of unspecified cerebral artery: Secondary | ICD-10-CM | POA: Insufficient documentation

## 2018-11-03 DIAGNOSIS — R627 Adult failure to thrive: Secondary | ICD-10-CM | POA: Diagnosis present

## 2018-11-03 DIAGNOSIS — K219 Gastro-esophageal reflux disease without esophagitis: Secondary | ICD-10-CM | POA: Diagnosis present

## 2018-11-03 DIAGNOSIS — F429 Obsessive-compulsive disorder, unspecified: Secondary | ICD-10-CM | POA: Diagnosis present

## 2018-11-03 DIAGNOSIS — Z888 Allergy status to other drugs, medicaments and biological substances status: Secondary | ICD-10-CM

## 2018-11-03 DIAGNOSIS — R4189 Other symptoms and signs involving cognitive functions and awareness: Secondary | ICD-10-CM | POA: Diagnosis present

## 2018-11-03 DIAGNOSIS — I371 Nonrheumatic pulmonary valve insufficiency: Secondary | ICD-10-CM | POA: Diagnosis present

## 2018-11-03 DIAGNOSIS — E872 Acidosis: Secondary | ICD-10-CM | POA: Diagnosis present

## 2018-11-03 DIAGNOSIS — Z833 Family history of diabetes mellitus: Secondary | ICD-10-CM

## 2018-11-03 DIAGNOSIS — E8729 Other acidosis: Secondary | ICD-10-CM | POA: Diagnosis present

## 2018-11-03 DIAGNOSIS — N390 Urinary tract infection, site not specified: Secondary | ICD-10-CM | POA: Diagnosis present

## 2018-11-03 DIAGNOSIS — E538 Deficiency of other specified B group vitamins: Secondary | ICD-10-CM | POA: Diagnosis present

## 2018-11-03 DIAGNOSIS — I456 Pre-excitation syndrome: Secondary | ICD-10-CM | POA: Diagnosis present

## 2018-11-03 DIAGNOSIS — Z8249 Family history of ischemic heart disease and other diseases of the circulatory system: Secondary | ICD-10-CM

## 2018-11-03 DIAGNOSIS — R531 Weakness: Secondary | ICD-10-CM

## 2018-11-03 DIAGNOSIS — Z91013 Allergy to seafood: Secondary | ICD-10-CM

## 2018-11-03 DIAGNOSIS — W19XXXA Unspecified fall, initial encounter: Secondary | ICD-10-CM | POA: Diagnosis present

## 2018-11-03 DIAGNOSIS — Z66 Do not resuscitate: Secondary | ICD-10-CM | POA: Diagnosis present

## 2018-11-03 DIAGNOSIS — Z90710 Acquired absence of both cervix and uterus: Secondary | ICD-10-CM

## 2018-11-03 DIAGNOSIS — R471 Dysarthria and anarthria: Secondary | ICD-10-CM | POA: Diagnosis present

## 2018-11-03 DIAGNOSIS — Z881 Allergy status to other antibiotic agents status: Secondary | ICD-10-CM

## 2018-11-03 DIAGNOSIS — I10 Essential (primary) hypertension: Secondary | ICD-10-CM | POA: Diagnosis present

## 2018-11-03 DIAGNOSIS — N3000 Acute cystitis without hematuria: Secondary | ICD-10-CM

## 2018-11-03 DIAGNOSIS — F4000 Agoraphobia, unspecified: Secondary | ICD-10-CM | POA: Diagnosis present

## 2018-11-03 DIAGNOSIS — I161 Hypertensive emergency: Secondary | ICD-10-CM | POA: Diagnosis present

## 2018-11-03 DIAGNOSIS — Y92009 Unspecified place in unspecified non-institutional (private) residence as the place of occurrence of the external cause: Secondary | ICD-10-CM

## 2018-11-03 DIAGNOSIS — I5022 Chronic systolic (congestive) heart failure: Secondary | ICD-10-CM | POA: Diagnosis present

## 2018-11-03 DIAGNOSIS — I259 Chronic ischemic heart disease, unspecified: Secondary | ICD-10-CM | POA: Diagnosis present

## 2018-11-03 DIAGNOSIS — E1110 Type 2 diabetes mellitus with ketoacidosis without coma: Principal | ICD-10-CM

## 2018-11-03 DIAGNOSIS — M7989 Other specified soft tissue disorders: Secondary | ICD-10-CM | POA: Diagnosis present

## 2018-11-03 DIAGNOSIS — I110 Hypertensive heart disease with heart failure: Secondary | ICD-10-CM | POA: Diagnosis present

## 2018-11-03 DIAGNOSIS — R296 Repeated falls: Secondary | ICD-10-CM | POA: Diagnosis present

## 2018-11-03 DIAGNOSIS — Z6837 Body mass index (BMI) 37.0-37.9, adult: Secondary | ICD-10-CM

## 2018-11-03 DIAGNOSIS — D519 Vitamin B12 deficiency anemia, unspecified: Secondary | ICD-10-CM | POA: Diagnosis present

## 2018-11-03 DIAGNOSIS — N920 Excessive and frequent menstruation with regular cycle: Secondary | ICD-10-CM | POA: Diagnosis present

## 2018-11-03 DIAGNOSIS — I639 Cerebral infarction, unspecified: Secondary | ICD-10-CM

## 2018-11-03 DIAGNOSIS — Z885 Allergy status to narcotic agent status: Secondary | ICD-10-CM

## 2018-11-03 DIAGNOSIS — E039 Hypothyroidism, unspecified: Secondary | ICD-10-CM | POA: Diagnosis present

## 2018-11-03 DIAGNOSIS — R29703 NIHSS score 3: Secondary | ICD-10-CM | POA: Diagnosis present

## 2018-11-03 DIAGNOSIS — E785 Hyperlipidemia, unspecified: Secondary | ICD-10-CM | POA: Diagnosis present

## 2018-11-03 DIAGNOSIS — G92 Toxic encephalopathy: Secondary | ICD-10-CM | POA: Diagnosis present

## 2018-11-03 DIAGNOSIS — R29707 NIHSS score 7: Secondary | ICD-10-CM | POA: Diagnosis not present

## 2018-11-03 DIAGNOSIS — F411 Generalized anxiety disorder: Secondary | ICD-10-CM | POA: Diagnosis present

## 2018-11-03 DIAGNOSIS — Z7982 Long term (current) use of aspirin: Secondary | ICD-10-CM

## 2018-11-03 DIAGNOSIS — D509 Iron deficiency anemia, unspecified: Secondary | ICD-10-CM | POA: Diagnosis present

## 2018-11-03 DIAGNOSIS — R29702 NIHSS score 2: Secondary | ICD-10-CM | POA: Diagnosis not present

## 2018-11-03 DIAGNOSIS — R29706 NIHSS score 6: Secondary | ICD-10-CM | POA: Diagnosis not present

## 2018-11-03 DIAGNOSIS — G8194 Hemiplegia, unspecified affecting left nondominant side: Secondary | ICD-10-CM | POA: Diagnosis present

## 2018-11-03 DIAGNOSIS — F329 Major depressive disorder, single episode, unspecified: Secondary | ICD-10-CM | POA: Diagnosis present

## 2018-11-03 DIAGNOSIS — E876 Hypokalemia: Secondary | ICD-10-CM

## 2018-11-03 DIAGNOSIS — Z20822 Contact with and (suspected) exposure to covid-19: Secondary | ICD-10-CM | POA: Diagnosis present

## 2018-11-03 DIAGNOSIS — K5900 Constipation, unspecified: Secondary | ICD-10-CM | POA: Diagnosis present

## 2018-11-03 DIAGNOSIS — Z79890 Hormone replacement therapy (postmenopausal): Secondary | ICD-10-CM

## 2018-11-03 DIAGNOSIS — E512 Wernicke's encephalopathy: Secondary | ICD-10-CM | POA: Diagnosis present

## 2018-11-03 DIAGNOSIS — E860 Dehydration: Secondary | ICD-10-CM

## 2018-11-03 LAB — CBC WITH DIFFERENTIAL/PLATELET
Abs Immature Granulocytes: 0.05 10*3/uL (ref 0.00–0.07)
Basophils Absolute: 0 10*3/uL (ref 0.0–0.1)
Basophils Relative: 0 %
Eosinophils Absolute: 0 10*3/uL (ref 0.0–0.5)
Eosinophils Relative: 0 %
HCT: 43.5 % (ref 36.0–46.0)
Hemoglobin: 14.6 g/dL (ref 12.0–15.0)
Immature Granulocytes: 0 %
Lymphocytes Relative: 7 %
Lymphs Abs: 0.9 10*3/uL (ref 0.7–4.0)
MCH: 30.6 pg (ref 26.0–34.0)
MCHC: 33.6 g/dL (ref 30.0–36.0)
MCV: 91.2 fL (ref 80.0–100.0)
Monocytes Absolute: 0.7 10*3/uL (ref 0.1–1.0)
Monocytes Relative: 5 %
Neutro Abs: 11.2 10*3/uL — ABNORMAL HIGH (ref 1.7–7.7)
Neutrophils Relative %: 88 %
Platelets: 343 10*3/uL (ref 150–400)
RBC: 4.77 MIL/uL (ref 3.87–5.11)
RDW: 12.3 % (ref 11.5–15.5)
WBC: 12.9 10*3/uL — ABNORMAL HIGH (ref 4.0–10.5)
nRBC: 0 % (ref 0.0–0.2)

## 2018-11-03 LAB — GLUCOSE, CAPILLARY
Glucose-Capillary: 113 mg/dL — ABNORMAL HIGH (ref 70–99)
Glucose-Capillary: 147 mg/dL — ABNORMAL HIGH (ref 70–99)

## 2018-11-03 LAB — BASIC METABOLIC PANEL
Anion gap: 17 — ABNORMAL HIGH (ref 5–15)
Anion gap: 16 — ABNORMAL HIGH (ref 5–15)
Anion gap: 12 (ref 5–15)
BUN: 10 mg/dL (ref 6–20)
BUN: 9 mg/dL (ref 6–20)
BUN: 8 mg/dL (ref 6–20)
CO2: 17 mmol/L — ABNORMAL LOW (ref 22–32)
CO2: 18 mmol/L — ABNORMAL LOW (ref 22–32)
CO2: 18 mmol/L — ABNORMAL LOW (ref 22–32)
Calcium: 9.2 mg/dL (ref 8.9–10.3)
Calcium: 9.2 mg/dL (ref 8.9–10.3)
Calcium: 8.8 mg/dL — ABNORMAL LOW (ref 8.9–10.3)
Chloride: 104 mmol/L (ref 98–111)
Chloride: 106 mmol/L (ref 98–111)
Chloride: 108 mmol/L (ref 98–111)
Creatinine, Ser: 0.64 mg/dL (ref 0.44–1.00)
Creatinine, Ser: 0.68 mg/dL (ref 0.44–1.00)
Creatinine, Ser: 0.62 mg/dL (ref 0.44–1.00)
GFR calc Af Amer: >60 mL/min (ref >60)
GFR calc Af Amer: >60 mL/min (ref >60)
GFR calc Af Amer: >60 mL/min (ref >60)
GFR calc non Af Amer: >60 mL/min (ref >60)
GFR calc non Af Amer: >60 mL/min (ref >60)
GFR calc non Af Amer: >60 mL/min (ref >60)
Glucose, Bld: 179 mg/dL — ABNORMAL HIGH (ref 70–99)
Glucose, Bld: 174 mg/dL — ABNORMAL HIGH (ref 70–99)
Glucose, Bld: 67 mg/dL — ABNORMAL LOW (ref 70–99)
Potassium: 3.5 mmol/L (ref 3.5–5.1)
Potassium: 3.3 mmol/L — ABNORMAL LOW (ref 3.5–5.1)
Potassium: 3 mmol/L — ABNORMAL LOW (ref 3.5–5.1)
Sodium: 138 mmol/L (ref 135–145)
Sodium: 140 mmol/L (ref 135–145)
Sodium: 138 mmol/L (ref 135–145)

## 2018-11-03 LAB — CBG MONITORING, ED
Glucose-Capillary: 158 mg/dL — ABNORMAL HIGH (ref 70–99)
Glucose-Capillary: 166 mg/dL — ABNORMAL HIGH (ref 70–99)
Glucose-Capillary: 72 mg/dL (ref 70–99)
Glucose-Capillary: 92 mg/dL (ref 70–99)

## 2018-11-03 LAB — URINALYSIS, ROUTINE W REFLEX MICROSCOPIC
Bilirubin Urine: NEGATIVE
Glucose, UA: >=500 mg/dL — AB
Ketones, ur: 80 mg/dL — AB
Nitrite: NEGATIVE
Protein, ur: 30 mg/dL — AB
Specific Gravity, Urine: 1.024 (ref 1.005–1.030)
pH: 6 (ref 5.0–8.0)

## 2018-11-03 LAB — TROPONIN I (HIGH SENSITIVITY): Troponin I (High Sensitivity): 6 ng/L (ref <18)

## 2018-11-03 LAB — RAPID URINE DRUG SCREEN, HOSP PERFORMED
Amphetamines: NOT DETECTED
Barbiturates: NOT DETECTED
Benzodiazepines: NOT DETECTED
Cocaine: NOT DETECTED
Opiates: NOT DETECTED
Tetrahydrocannabinol: NOT DETECTED

## 2018-11-03 LAB — COMPREHENSIVE METABOLIC PANEL
ALT: 21 U/L (ref 0–44)
AST: 18 U/L (ref 15–41)
Albumin: 4.5 g/dL (ref 3.5–5.0)
Alkaline Phosphatase: 62 U/L (ref 38–126)
Anion gap: 18 — ABNORMAL HIGH (ref 5–15)
BUN: 12 mg/dL (ref 6–20)
CO2: 18 mmol/L — ABNORMAL LOW (ref 22–32)
Calcium: 9.5 mg/dL (ref 8.9–10.3)
Chloride: 102 mmol/L (ref 98–111)
Creatinine, Ser: 0.79 mg/dL (ref 0.44–1.00)
GFR calc Af Amer: >60 mL/min (ref >60)
GFR calc non Af Amer: >60 mL/min (ref >60)
Glucose, Bld: 221 mg/dL — ABNORMAL HIGH (ref 70–99)
Potassium: 3.4 mmol/L — ABNORMAL LOW (ref 3.5–5.1)
Sodium: 138 mmol/L (ref 135–145)
Total Bilirubin: 1.5 mg/dL — ABNORMAL HIGH (ref 0.3–1.2)
Total Protein: 7.7 g/dL (ref 6.5–8.1)

## 2018-11-03 LAB — TSH: TSH: 0.612 u[IU]/mL (ref 0.350–4.500)

## 2018-11-03 LAB — CK: Total CK: 96 U/L (ref 38–234)

## 2018-11-03 LAB — SARS CORONAVIRUS 2 (TAT 6-24 HRS): SARS Coronavirus 2: NEGATIVE

## 2018-11-03 LAB — VITAMIN B12: Vitamin B-12: 2165 pg/mL — ABNORMAL HIGH (ref 180–914)

## 2018-11-03 LAB — LIPASE, BLOOD: Lipase: 18 U/L (ref 11–51)

## 2018-11-03 LAB — HEMOGLOBIN A1C
Hgb A1c MFr Bld: 7.9 % — ABNORMAL HIGH (ref 4.8–5.6)
Mean Plasma Glucose: 180.03 mg/dL

## 2018-11-03 LAB — HIV ANTIBODY (ROUTINE TESTING W REFLEX): HIV Screen 4th Generation wRfx: NONREACTIVE

## 2018-11-03 LAB — MAGNESIUM: Magnesium: 1.9 mg/dL (ref 1.7–2.4)

## 2018-11-03 LAB — T4, FREE: Free T4: 0.99 ng/dL (ref 0.61–1.12)

## 2018-11-03 LAB — LACTIC ACID, PLASMA: Lactic Acid, Venous: 1.4 mmol/L (ref 0.5–1.9)

## 2018-11-03 LAB — AMMONIA: Ammonia: 41 umol/L — ABNORMAL HIGH (ref 9–35)

## 2018-11-03 LAB — BETA-HYDROXYBUTYRIC ACID: Beta-Hydroxybutyric Acid: 4.19 mmol/L — ABNORMAL HIGH (ref 0.05–0.27)

## 2018-11-03 MED ORDER — INSULIN REGULAR(HUMAN) IN NACL 100-0.9 UT/100ML-% IV SOLN: INTRAVENOUS | Status: DC

## 2018-11-03 MED ORDER — THIAMINE HCL 100 MG/ML IJ SOLN
500.0000 mg | Freq: Three times a day (TID) | INTRAVENOUS | Status: DC
  Administered 2018-11-03 – 2018-11-06 (×8): 500 mg via INTRAVENOUS
  Filled 2018-11-03 (×10): qty 5

## 2018-11-03 MED ORDER — STROKE: EARLY STAGES OF RECOVERY BOOK
Freq: Once | Status: DC
  Filled 2018-11-03 (×2): qty 1

## 2018-11-03 MED ORDER — ASPIRIN 300 MG RE SUPP: 300.0000 mg | Freq: Every day | RECTAL | Status: DC

## 2018-11-03 MED ORDER — SODIUM CHLORIDE 0.9 % IV SOLN
1.0000 g | INTRAVENOUS | Status: DC
  Administered 2018-11-03 – 2018-11-05 (×3): 1 g via INTRAVENOUS
  Filled 2018-11-03 (×4): qty 10

## 2018-11-03 MED ORDER — SODIUM CHLORIDE 0.9 % IV SOLN
1.0000 g | Freq: Once | INTRAVENOUS | Status: AC
  Administered 2018-11-03: 1 g via INTRAVENOUS
  Filled 2018-11-03: qty 10

## 2018-11-03 MED ORDER — ASPIRIN 325 MG PO TABS
325.0000 mg | ORAL_TABLET | Freq: Every day | ORAL | Status: DC
  Administered 2018-11-03 – 2018-11-07 (×5): 325 mg via ORAL
  Filled 2018-11-03 (×5): qty 1

## 2018-11-03 MED ORDER — INSULIN ASPART 100 UNIT/ML ~~LOC~~ SOLN
5.0000 [IU] | Freq: Once | SUBCUTANEOUS | Status: AC
  Administered 2018-11-03: 5 [IU] via INTRAVENOUS
  Filled 2018-11-03: qty 0.05

## 2018-11-03 MED ORDER — POTASSIUM CHLORIDE 10 MEQ/100ML IV SOLN
10.0000 meq | INTRAVENOUS | Status: AC
  Filled 2018-11-03: qty 100

## 2018-11-03 MED ORDER — ASPIRIN 300 MG RE SUPP: 300.0000 mg | Freq: Once | RECTAL | Status: DC

## 2018-11-03 MED ORDER — POTASSIUM CHLORIDE CRYS ER 20 MEQ PO TBCR
40.0000 meq | EXTENDED_RELEASE_TABLET | Freq: Once | ORAL | Status: AC
  Administered 2018-11-03: 40 meq via ORAL
  Filled 2018-11-03: qty 2

## 2018-11-03 MED ORDER — SODIUM CHLORIDE 0.9 % IV BOLUS
1000.0000 mL | Freq: Once | INTRAVENOUS | Status: AC
  Administered 2018-11-03: 1000 mL via INTRAVENOUS

## 2018-11-03 MED ORDER — AMLODIPINE BESYLATE 5 MG PO TABS
5.0000 mg | ORAL_TABLET | Freq: Every day | ORAL | Status: DC
  Filled 2018-11-03: qty 1

## 2018-11-03 MED ORDER — POTASSIUM CHLORIDE 10 MEQ/100ML IV SOLN
10.0000 meq | INTRAVENOUS | Status: AC
  Administered 2018-11-03: 10 meq via INTRAVENOUS

## 2018-11-03 MED ORDER — DEXTROSE-NACL 5-0.45 % IV SOLN: INTRAVENOUS | Status: DC

## 2018-11-03 MED ORDER — HYDRALAZINE HCL 25 MG PO TABS
25.0000 mg | ORAL_TABLET | Freq: Four times a day (QID) | ORAL | Status: DC | PRN
  Filled 2018-11-03: qty 1

## 2018-11-03 MED ORDER — THIAMINE HCL 100 MG/ML IJ SOLN
500.0000 mg | Freq: Once | INTRAVENOUS | Status: AC
  Administered 2018-11-03: 500 mg via INTRAVENOUS
  Filled 2018-11-03: qty 2

## 2018-11-03 MED ORDER — SODIUM CHLORIDE 0.9 % IV BOLUS
500.0000 mL | Freq: Once | INTRAVENOUS | Status: AC
  Administered 2018-11-03: 500 mL via INTRAVENOUS

## 2018-11-03 MED ORDER — ENOXAPARIN SODIUM 40 MG/0.4ML ~~LOC~~ SOLN
40.0000 mg | SUBCUTANEOUS | Status: DC
  Administered 2018-11-03 – 2018-12-11 (×39): 40 mg via SUBCUTANEOUS
  Filled 2018-11-03 (×39): qty 0.4

## 2018-11-03 MED ORDER — INSULIN ASPART 100 UNIT/ML ~~LOC~~ SOLN
0.0000 [IU] | SUBCUTANEOUS | Status: DC
  Administered 2018-11-04 (×2): 2 [IU] via SUBCUTANEOUS
  Filled 2018-11-03: qty 0.15

## 2018-11-03 MED ORDER — SODIUM CHLORIDE 0.9 % IV SOLN: INTRAVENOUS | Status: AC

## 2018-11-03 MED ORDER — ONDANSETRON HCL 4 MG/2ML IJ SOLN
4.0000 mg | Freq: Four times a day (QID) | INTRAMUSCULAR | Status: DC | PRN
  Administered 2018-11-03 – 2018-11-04 (×2): 4 mg via INTRAVENOUS
  Filled 2018-11-03 (×2): qty 2

## 2018-11-03 MED ORDER — SODIUM CHLORIDE 0.9 % IV SOLN: INTRAVENOUS | Status: DC

## 2018-11-03 MED ORDER — LORAZEPAM 2 MG/ML IJ SOLN
0.5000 mg | Freq: Once | INTRAMUSCULAR | Status: DC | PRN
  Filled 2018-11-03: qty 1

## 2018-11-03 MED ORDER — ACETAMINOPHEN 325 MG PO TABS
650.0000 mg | ORAL_TABLET | Freq: Four times a day (QID) | ORAL | Status: DC | PRN
  Administered 2018-11-03 – 2018-11-04 (×2): 650 mg via ORAL
  Filled 2018-11-03 (×2): qty 2

## 2018-11-03 MED ORDER — LACTATED RINGERS IV SOLN
INTRAVENOUS | Status: DC
  Administered 2018-11-03 – 2018-11-07 (×8): via INTRAVENOUS

## 2018-11-03 MED ORDER — ATORVASTATIN CALCIUM 80 MG PO TABS
80.0000 mg | ORAL_TABLET | Freq: Every day | ORAL | Status: DC
  Administered 2018-11-03 – 2019-02-01 (×90): 80 mg via ORAL
  Filled 2018-11-03 (×93): qty 1

## 2018-11-03 NOTE — ED Notes (Signed)
Per sister pt worsening AMS for a while but notably worse since yesterday. Upon assessment pt follows commands; but disoriented; alert to self; disoriented to time, place, or situation.
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
## 2018-11-03 NOTE — ED Notes (Signed)
Patient denies having nausea and vomiting. Pt is reporting she has excessive hiccups after eating.

## 2018-11-03 NOTE — ED Notes (Signed)
Swallow screen not completed per sister pt "has trouble swallowing pills." Provider verbalizes will place swallow study order.
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
## 2018-11-03 NOTE — ED Notes (Signed)
Patient transported to MRI 

## 2018-11-03 NOTE — ED Notes (Signed)
Patient transported to CT 

## 2018-11-03 NOTE — ED Triage Notes (Signed)
Per EMS, Pt is coming from home. Pt called out for nausea vomiting, denies diarrhea. Pt also presenting confused, difficulty following directions, however pt is able to answer all questions correctly. Pt is at baseline. Pt has poor hygiene and po intake. Pt states she only eating one chicken sandwich a day and unable to hold any else down. Pt had one episode of anxiety where HR jumped up the 120's but resolved after pt calmed down. Pt has hx of diabetes, which she currently does not treat.
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
## 2018-11-03 NOTE — ED Notes (Signed)
When giving report Kerry Dory RN on floor made aware; will give something for BP here and discuss plan of care/bed assignment with provider.

## 2018-11-03 NOTE — ED Notes (Signed)
[facility] called for transport. 

## 2018-11-03 NOTE — ED Notes (Signed)
Dr. Posey Pronto at bedside. Made aware of trending BP. Per Dr. Posey Pronto will hold on insulin infusion at this time and continue with fluids.

## 2018-11-03 NOTE — ED Notes (Signed)
Per provider not to give PO medicine at present; awaiting lab draw and MRI for further action.
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
## 2018-11-03 NOTE — ED Notes (Signed)
The patients sister is at bedside and is requesting home placement for the patient. The sister states the patient has altered mental status and is no longer able to care for herself. Provider made aware.

## 2018-11-04 ENCOUNTER — Other Ambulatory Visit (HOSPITAL_COMMUNITY): Payer: Self-pay

## 2018-11-04 ENCOUNTER — Inpatient Hospital Stay (HOSPITAL_COMMUNITY): Payer: Self-pay

## 2018-11-04 DIAGNOSIS — I639 Cerebral infarction, unspecified: Secondary | ICD-10-CM

## 2018-11-04 DIAGNOSIS — E538 Deficiency of other specified B group vitamins: Secondary | ICD-10-CM

## 2018-11-04 DIAGNOSIS — I6330 Cerebral infarction due to thrombosis of unspecified cerebral artery: Secondary | ICD-10-CM | POA: Insufficient documentation

## 2018-11-04 DIAGNOSIS — I456 Pre-excitation syndrome: Secondary | ICD-10-CM

## 2018-11-04 DIAGNOSIS — I371 Nonrheumatic pulmonary valve insufficiency: Secondary | ICD-10-CM

## 2018-11-04 DIAGNOSIS — N390 Urinary tract infection, site not specified: Secondary | ICD-10-CM

## 2018-11-04 LAB — GLUCOSE, CAPILLARY
Glucose-Capillary: 115 mg/dL — ABNORMAL HIGH (ref 70–99)
Glucose-Capillary: 122 mg/dL — ABNORMAL HIGH (ref 70–99)
Glucose-Capillary: 130 mg/dL — ABNORMAL HIGH (ref 70–99)
Glucose-Capillary: 145 mg/dL — ABNORMAL HIGH (ref 70–99)
Glucose-Capillary: 147 mg/dL — ABNORMAL HIGH (ref 70–99)
Glucose-Capillary: 93 mg/dL (ref 70–99)

## 2018-11-04 LAB — CBC WITH DIFFERENTIAL/PLATELET
Abs Immature Granulocytes: 0.04 10*3/uL (ref 0.00–0.07)
Basophils Absolute: 0 10*3/uL (ref 0.0–0.1)
Basophils Relative: 0 %
Eosinophils Absolute: 0 10*3/uL (ref 0.0–0.5)
Eosinophils Relative: 0 %
HCT: 39 % (ref 36.0–46.0)
Hemoglobin: 13.5 g/dL (ref 12.0–15.0)
Immature Granulocytes: 0 %
Lymphocytes Relative: 17 %
Lymphs Abs: 1.8 10*3/uL (ref 0.7–4.0)
MCH: 31.3 pg (ref 26.0–34.0)
MCHC: 34.6 g/dL (ref 30.0–36.0)
MCV: 90.3 fL (ref 80.0–100.0)
Monocytes Absolute: 0.8 10*3/uL (ref 0.1–1.0)
Monocytes Relative: 8 %
Neutro Abs: 7.8 10*3/uL — ABNORMAL HIGH (ref 1.7–7.7)
Neutrophils Relative %: 75 %
Platelets: 287 10*3/uL (ref 150–400)
RBC: 4.32 MIL/uL (ref 3.87–5.11)
RDW: 12.4 % (ref 11.5–15.5)
WBC: 10.4 10*3/uL (ref 4.0–10.5)
nRBC: 0 % (ref 0.0–0.2)

## 2018-11-04 LAB — URINE CULTURE

## 2018-11-04 LAB — ECHOCARDIOGRAM COMPLETE

## 2018-11-04 LAB — MAGNESIUM: Magnesium: 1.8 mg/dL (ref 1.7–2.4)

## 2018-11-04 MED ORDER — POTASSIUM CHLORIDE CRYS ER 20 MEQ PO TBCR
20.0000 meq | EXTENDED_RELEASE_TABLET | Freq: Three times a day (TID) | ORAL | Status: DC
  Administered 2018-11-04 (×3): 20 meq via ORAL
  Filled 2018-11-04 (×3): qty 1

## 2018-11-04 MED ORDER — LACTULOSE 10 GM/15ML PO SOLN
20.0000 g | ORAL | Status: AC
  Administered 2018-11-04: 20 g via ORAL
  Filled 2018-11-04: qty 30

## 2018-11-04 MED ORDER — LABETALOL HCL 5 MG/ML IV SOLN: 5.0000 mg | INTRAVENOUS | Status: DC | PRN

## 2018-11-04 MED ORDER — INSULIN ASPART 100 UNIT/ML ~~LOC~~ SOLN
0.0000 [IU] | Freq: Every day | SUBCUTANEOUS | Status: DC
  Administered 2018-11-12: 22:00:00 2 [IU] via SUBCUTANEOUS
  Administered 2018-11-13: 22:00:00 3 [IU] via SUBCUTANEOUS
  Administered 2018-11-15: 22:00:00 2 [IU] via SUBCUTANEOUS
  Administered 2018-11-17: 22:00:00 5 [IU] via SUBCUTANEOUS
  Administered 2018-11-18 – 2018-11-19 (×2): 3 [IU] via SUBCUTANEOUS
  Administered 2018-11-20: 4 [IU] via SUBCUTANEOUS
  Administered 2018-11-21 – 2018-11-23 (×2): 2 [IU] via SUBCUTANEOUS
  Administered 2018-11-25: 3 [IU] via SUBCUTANEOUS
  Administered 2018-11-28 – 2018-12-27 (×5): 2 [IU] via SUBCUTANEOUS

## 2018-11-04 MED ORDER — INSULIN ASPART 100 UNIT/ML ~~LOC~~ SOLN
0.0000 [IU] | Freq: Three times a day (TID) | SUBCUTANEOUS | Status: DC
  Administered 2018-11-05 – 2018-11-06 (×3): 2 [IU] via SUBCUTANEOUS
  Administered 2018-11-06: 18:00:00 3 [IU] via SUBCUTANEOUS
  Administered 2018-11-06: 5 [IU] via SUBCUTANEOUS
  Administered 2018-11-07: 13:00:00 3 [IU] via SUBCUTANEOUS
  Administered 2018-11-07 (×2): 2 [IU] via SUBCUTANEOUS
  Administered 2018-11-08: 5 [IU] via SUBCUTANEOUS
  Administered 2018-11-08: 10:00:00 2 [IU] via SUBCUTANEOUS
  Administered 2018-11-08: 17:00:00 3 [IU] via SUBCUTANEOUS
  Administered 2018-11-09: 2 [IU] via SUBCUTANEOUS
  Administered 2018-11-09 – 2018-11-10 (×2): 5 [IU] via SUBCUTANEOUS
  Administered 2018-11-10 – 2018-11-11 (×5): 3 [IU] via SUBCUTANEOUS
  Administered 2018-11-12: 5 [IU] via SUBCUTANEOUS
  Administered 2018-11-12: 3 [IU] via SUBCUTANEOUS
  Administered 2018-11-12: 2 [IU] via SUBCUTANEOUS
  Administered 2018-11-13 – 2018-11-14 (×6): 3 [IU] via SUBCUTANEOUS
  Administered 2018-11-15: 5 [IU] via SUBCUTANEOUS
  Administered 2018-11-15: 3 [IU] via SUBCUTANEOUS
  Administered 2018-11-16: 5 [IU] via SUBCUTANEOUS
  Administered 2018-11-16: 12:00:00 3 [IU] via SUBCUTANEOUS
  Administered 2018-11-17: 5 [IU] via SUBCUTANEOUS
  Administered 2018-11-17: 07:00:00 3 [IU] via SUBCUTANEOUS
  Administered 2018-11-17 – 2018-11-18 (×2): 5 [IU] via SUBCUTANEOUS
  Administered 2018-11-18: 12:00:00 3 [IU] via SUBCUTANEOUS
  Administered 2018-11-18: 5 [IU] via SUBCUTANEOUS
  Administered 2018-11-19: 2 [IU] via SUBCUTANEOUS
  Administered 2018-11-19: 3 [IU] via SUBCUTANEOUS
  Administered 2018-11-19: 8 [IU] via SUBCUTANEOUS
  Administered 2018-11-20: 3 [IU] via SUBCUTANEOUS
  Administered 2018-11-20: 8 [IU] via SUBCUTANEOUS
  Administered 2018-11-21 (×3): 5 [IU] via SUBCUTANEOUS
  Administered 2018-11-22 – 2018-11-23 (×4): 3 [IU] via SUBCUTANEOUS
  Administered 2018-11-23: 5 [IU] via SUBCUTANEOUS
  Administered 2018-11-24 (×2): 3 [IU] via SUBCUTANEOUS
  Administered 2018-11-24 – 2018-11-25 (×2): 2 [IU] via SUBCUTANEOUS
  Administered 2018-11-25: 13:00:00 5 [IU] via SUBCUTANEOUS
  Administered 2018-11-25 – 2018-11-26 (×4): 3 [IU] via SUBCUTANEOUS
  Administered 2018-11-27: 2 [IU] via SUBCUTANEOUS
  Administered 2018-11-27 – 2018-11-28 (×2): 3 [IU] via SUBCUTANEOUS
  Administered 2018-11-28: 5 [IU] via SUBCUTANEOUS
  Administered 2018-11-29 (×2): 3 [IU] via SUBCUTANEOUS
  Administered 2018-11-29: 17:00:00 2 [IU] via SUBCUTANEOUS
  Administered 2018-11-30: 3 [IU] via SUBCUTANEOUS
  Administered 2018-11-30: 8 [IU] via SUBCUTANEOUS
  Administered 2018-12-01: 3 [IU] via SUBCUTANEOUS
  Administered 2018-12-01: 2 [IU] via SUBCUTANEOUS
  Administered 2018-12-01 – 2018-12-02 (×2): 5 [IU] via SUBCUTANEOUS
  Administered 2018-12-02 – 2018-12-03 (×3): 3 [IU] via SUBCUTANEOUS
  Administered 2018-12-03 – 2018-12-04 (×2): 2 [IU] via SUBCUTANEOUS
  Administered 2018-12-04 – 2018-12-05 (×3): 3 [IU] via SUBCUTANEOUS
  Administered 2018-12-05: 17:00:00 2 [IU] via SUBCUTANEOUS
  Administered 2018-12-06: 3 [IU] via SUBCUTANEOUS
  Administered 2018-12-06: 5 [IU] via SUBCUTANEOUS
  Administered 2018-12-06: 18:00:00 3 [IU] via SUBCUTANEOUS
  Administered 2018-12-07: 12:00:00 11 [IU] via SUBCUTANEOUS
  Administered 2018-12-07 – 2018-12-08 (×2): 3 [IU] via SUBCUTANEOUS
  Administered 2018-12-08: 8 [IU] via SUBCUTANEOUS
  Administered 2018-12-09: 2 [IU] via SUBCUTANEOUS
  Administered 2018-12-09: 3 [IU] via SUBCUTANEOUS
  Administered 2018-12-09: 5 [IU] via SUBCUTANEOUS
  Administered 2018-12-10: 3 [IU] via SUBCUTANEOUS
  Administered 2018-12-10: 2 [IU] via SUBCUTANEOUS
  Administered 2018-12-10 – 2018-12-11 (×2): 3 [IU] via SUBCUTANEOUS
  Administered 2018-12-11: 2 [IU] via SUBCUTANEOUS
  Administered 2018-12-11 – 2018-12-12 (×2): 5 [IU] via SUBCUTANEOUS
  Administered 2018-12-12: 3 [IU] via SUBCUTANEOUS
  Administered 2018-12-13: 2 [IU] via SUBCUTANEOUS
  Administered 2018-12-13: 3 [IU] via SUBCUTANEOUS
  Administered 2018-12-13 – 2018-12-14 (×2): 2 [IU] via SUBCUTANEOUS
  Administered 2018-12-14: 3 [IU] via SUBCUTANEOUS
  Administered 2018-12-14: 5 [IU] via SUBCUTANEOUS
  Administered 2018-12-15: 8 [IU] via SUBCUTANEOUS
  Administered 2018-12-15 – 2018-12-16 (×3): 3 [IU] via SUBCUTANEOUS
  Administered 2018-12-16: 2 [IU] via SUBCUTANEOUS
  Administered 2018-12-17: 3 [IU] via SUBCUTANEOUS
  Administered 2018-12-17: 5 [IU] via SUBCUTANEOUS
  Administered 2018-12-18 (×2): 3 [IU] via SUBCUTANEOUS
  Administered 2018-12-18: 18:00:00 2 [IU] via SUBCUTANEOUS
  Administered 2018-12-19: 3 [IU] via SUBCUTANEOUS
  Administered 2018-12-19: 8 [IU] via SUBCUTANEOUS
  Administered 2018-12-19: 2 [IU] via SUBCUTANEOUS
  Administered 2018-12-20: 5 [IU] via SUBCUTANEOUS
  Administered 2018-12-20: 3 [IU] via SUBCUTANEOUS
  Administered 2018-12-20: 2 [IU] via SUBCUTANEOUS
  Administered 2018-12-21 – 2018-12-22 (×4): 3 [IU] via SUBCUTANEOUS
  Administered 2018-12-22: 5 [IU] via SUBCUTANEOUS
  Administered 2018-12-22: 2 [IU] via SUBCUTANEOUS
  Administered 2018-12-23: 3 [IU] via SUBCUTANEOUS
  Administered 2018-12-23: 2 [IU] via SUBCUTANEOUS
  Administered 2018-12-23: 5 [IU] via SUBCUTANEOUS
  Administered 2018-12-24: 3 [IU] via SUBCUTANEOUS
  Administered 2018-12-24: 5 [IU] via SUBCUTANEOUS
  Administered 2018-12-25: 3 [IU] via SUBCUTANEOUS
  Administered 2018-12-25: 5 [IU] via SUBCUTANEOUS
  Administered 2018-12-26 (×3): 3 [IU] via SUBCUTANEOUS
  Administered 2018-12-27: 2 [IU] via SUBCUTANEOUS
  Administered 2018-12-27: 3 [IU] via SUBCUTANEOUS
  Administered 2018-12-27: 5 [IU] via SUBCUTANEOUS
  Administered 2018-12-28 (×2): 3 [IU] via SUBCUTANEOUS
  Administered 2018-12-28: 2 [IU] via SUBCUTANEOUS
  Administered 2018-12-29: 3 [IU] via SUBCUTANEOUS
  Administered 2018-12-29: 5 [IU] via SUBCUTANEOUS
  Administered 2018-12-30 – 2019-01-02 (×9): 3 [IU] via SUBCUTANEOUS
  Administered 2019-01-02: 5 [IU] via SUBCUTANEOUS
  Administered 2019-01-03 – 2019-01-05 (×8): 3 [IU] via SUBCUTANEOUS
  Administered 2019-01-06: 2 [IU] via SUBCUTANEOUS
  Administered 2019-01-06: 5 [IU] via SUBCUTANEOUS
  Administered 2019-01-06 – 2019-01-08 (×4): 3 [IU] via SUBCUTANEOUS
  Administered 2019-01-08: 2 [IU] via SUBCUTANEOUS
  Administered 2019-01-09 (×2): 3 [IU] via SUBCUTANEOUS
  Administered 2019-01-10: 13:00:00 2 [IU] via SUBCUTANEOUS
  Administered 2019-01-10: 18:00:00 5 [IU] via SUBCUTANEOUS
  Administered 2019-01-10 – 2019-01-11 (×2): 3 [IU] via SUBCUTANEOUS
  Administered 2019-01-11: 12:00:00 2 [IU] via SUBCUTANEOUS
  Administered 2019-01-12 – 2019-01-13 (×5): 3 [IU] via SUBCUTANEOUS
  Administered 2019-01-14 (×2): 5 [IU] via SUBCUTANEOUS
  Administered 2019-01-15 (×2): 3 [IU] via SUBCUTANEOUS
  Administered 2019-01-16: 12:00:00 2 [IU] via SUBCUTANEOUS
  Administered 2019-01-16: 07:00:00 3 [IU] via SUBCUTANEOUS
  Administered 2019-01-17: 2 [IU] via SUBCUTANEOUS
  Administered 2019-01-17 – 2019-01-19 (×4): 3 [IU] via SUBCUTANEOUS
  Administered 2019-01-19: 13:00:00 8 [IU] via SUBCUTANEOUS
  Administered 2019-01-20: 13:00:00 5 [IU] via SUBCUTANEOUS
  Administered 2019-01-20: 07:00:00 3 [IU] via SUBCUTANEOUS
  Administered 2019-01-20 – 2019-01-21 (×2): 2 [IU] via SUBCUTANEOUS
  Administered 2019-01-21 (×2): 5 [IU] via SUBCUTANEOUS
  Administered 2019-01-22: 07:00:00 2 [IU] via SUBCUTANEOUS
  Administered 2019-01-22 – 2019-01-24 (×4): 3 [IU] via SUBCUTANEOUS
  Administered 2019-01-24: 07:00:00 2 [IU] via SUBCUTANEOUS
  Administered 2019-01-25 – 2019-01-27 (×6): 3 [IU] via SUBCUTANEOUS
  Administered 2019-01-28: 2 [IU] via SUBCUTANEOUS
  Administered 2019-01-28: 3 [IU] via SUBCUTANEOUS
  Administered 2019-01-29: 5 [IU] via SUBCUTANEOUS
  Administered 2019-01-29 – 2019-01-30 (×2): 2 [IU] via SUBCUTANEOUS
  Administered 2019-01-30: 5 [IU] via SUBCUTANEOUS
  Administered 2019-01-31 (×2): 2 [IU] via SUBCUTANEOUS

## 2018-11-04 NOTE — Plan of Care (Signed)
  Problem: Clinical Measurements: Goal: Diagnostic test results will improve Outcome: Progressing   Problem: Clinical Measurements: Goal: Will remain free from infection Outcome: Progressing   Problem: Clinical Measurements: Goal: Ability to maintain clinical measurements within normal limits will improve Outcome: Progressing   Problem: Health Behavior/Discharge Planning: Goal: Ability to manage health-related needs will improve Outcome: Not Progressing

## 2018-11-04 NOTE — Progress Notes (Signed)
Soap sud enema done,  Minimal result.  Patient said she has not been eating for weeks.

## 2018-11-04 NOTE — Progress Notes (Signed)
PROGRESS NOTE    Becky Stout  [id number] DOB: 1955/09/13 DOA: 11/03/2018 PCP: Abner Greenspan, MD    Brief Narrative:  63 y.o. female with Past medical history of B12 deficiency, type II DM not on therapy, HTN, IDA, obesity, WPW syndrome. Patient was brought into secondary to increasing confusion as well as failure to thrive and a fall. There was also report of nausea and vomiting. Patient lives alone, family member brings food to her house on a regular basis.  Patient will heat up the food or freeze and eat. Today when patient's mother had some concerns regarding patient's condition and ask her sister to check on her.  When the sister arrived at home she saw that the patient was on the floor was confused and there were food sitting in the refrigerator which was older than 3 weeks. Patient also reported nausea and vomiting and was unable to stand up on her own after a fall that she had last night. patient does not remember how she fell or whether she passed out or not. On questioning any review of system patient will start talking about nonrelevant subjects and topics. Patient denies any complaints of headache dizziness lightheadedness chest pain abdominal pain nausea at the time of my evaluation diarrhea at home or burning urination. Patient has chronic swelling in her leg. Patient is generally not taking any medications for her medical conditions.  Assessment & Plan:   Principal Problem:   Failure to thrive in adult Active Problems:   Hypothyroidism   B12 deficiency   WOLFF (WOLFE)-PARKINSON-WHITE (WPW) SYNDROME   Essential hypertension   Hyperlipidemia   Poorly controlled type 2 diabetes mellitus (HCC)   Acute lower UTI   High anion gap metabolic acidosis   DKA (diabetic ketoacidoses) (HCC)   Cerebral thrombosis with cerebral infarction  1. Failure to thrive in adult -Presents with confusion with hx of poor PO intake -Will consult nutrition for further recs -Currently  continued on carb mod diet -repeat cmp in AM  2.  DKA Poorly controlled diabetes with hyperglycemia -Not taking meds prior to admit -Presenting a1c of 7.9 -Currently continued on SSI coverage alone with stable glucose trends -GAP now closed -Cr is <1. Can consider metformin at time of d/c -repeat lytes in AM  3.  Acute CVA. Acute metabolic encephalopathy. Unwitnessed fall -Multiple cerebral and brainstem lacunar infarcts appreciated -Stroke team following -Discussed case with Dr. Leonie Man. Concern for dehydration in setting of above DKA and below UTI leading to infarcts -Neurology recommendations for ASA and plavix x 3 weeks followed by plavix alone. No need for loop or TEE per Neurology -PT/OT consulted, awaiting input -LDL of 163. Pt is now on lipitor  4.  Accelerated hypertension. -Had allowed permissive HTN since admit -Neurology recommends gradually normalizing BP over a period of 5-7 days -If pt remains stable and if renal function stable, consider starting low dose ACEI in the next 24-48hrs, which would aid in renal protection in a diabetic -For now, continue on PRN labetalol for SBP>220 or DBP>120  5.  Hypothyroidism. -Continue Synthroid as tolerated -TSH and free T4 normal. -Stable  6.  B12 deficiency. -Currently B12 level significantly elevated. -Seems to be stable  7.  Acute UTI. -Presenting UA reviewed, and is consistent with UTI -Urine culture is inconclusive. Will repeat culture  8.  Intractable nausea and vomiting. -Likely from constipation based on the x-ray. -Will give trial of lactulose -Continue cathartics with goal of normal BM  9. WPW syndrome. -  Presenting EKG unremarkable. -Will cont to monitor on tele -Repeat lytes in AM and correct as needed  10. Hypokalemia -Possible related to above malnutrition -KCl ordered for today -Will repeat lytes in AM and cont to replace as needed with goal K of >4  DVT prophylaxis: Lovenox subQ Code  Status: DNR Family Communication: Pt in room, family not at bedside Disposition Plan: Uncertain at this time  Consultants:   Neurology/Stroke Team  Procedures:     Antimicrobials: Anti-infectives (From admission, onward)   Start     Dose/Rate Route Frequency Ordered Stop   11/03/18 1930  cefTRIAXone (ROCEPHIN) 1 g in sodium chloride 0.9 % 100 mL IVPB     1 g 200 mL/hr over 30 Minutes Intravenous Every 24 hours 11/03/18 1724     11/03/18 0530  cefTRIAXone (ROCEPHIN) 1 g in sodium chloride 0.9 % 100 mL IVPB     1 g 200 mL/hr over 30 Minutes Intravenous  Once 11/03/18 0525 11/03/18 0629       Subjective: Without complaints this AM  Objective: Vitals:   11/04/18 0408 11/04/18 0751 11/04/18 0905 11/04/18 1138  BP: (!) 165/73 (!) 187/72 (!) 187/74 (!) 192/68  Pulse:  78  75  Resp: 20 16 18 16   Temp: 98.5 F (36.9 C) 98 F (36.7 C)  97.8 F (36.6 C)  TempSrc: Oral Oral  Oral  SpO2: 98%   100%    Intake/Output Summary (Last 24 hours) at 11/04/2018 1513 Last data filed at 11/04/2018 0600 Gross per 24 hour  Intake 1309.89 ml  Output 650 ml  Net 659.89 ml   There were no vitals filed for this visit.  Examination:  General exam: Appears calm and comfortable  Respiratory system: Clear to auscultation. Respiratory effort normal. Cardiovascular system: S1 & S2 heard, RRR Gastrointestinal system: Abdomen is nondistended, soft and nontender. No organomegaly or masses felt. Normal bowel sounds heard. Central nervous system: Alert and oriented. No focal neurological deficits. Extremities: Symmetric 5 x 5 power. Skin: No rashes, lesions Psychiatry: Judgement and insight appear normal. Mood & affect appropriate.   Data Reviewed: I have personally reviewed following labs and imaging studies  CBC: Recent Labs  Lab 11/03/18 0359 11/04/18 0337  WBC 12.9* 10.4  NEUTROABS 11.2* 7.8*  HGB 14.6 13.5  HCT 43.5 39.0  MCV 91.2 90.3  PLT 343 [id number]   Basic Metabolic  Panel: Recent Labs  Lab 11/03/18 0359 11/03/18 0912 11/03/18 1041 11/03/18 1530 11/04/18 0337  NA 138 138 140 138  --   K 3.4* 3.5 3.3* 3.0*  --   CL 102 104 106 108  --   CO2 18* 17* 18* 18*  --   GLUCOSE 221* 179* 174* 67*  --   BUN 12 10 9 8   --   CREATININE 0.79 0.64 0.68 0.62  --   CALCIUM 9.5 9.2 9.2 8.8*  --   MG  --  1.9  --   --  1.8   GFR: CrCl cannot be calculated (Unknown ideal weight.). Liver Function Tests: Recent Labs  Lab 11/03/18 0359  AST 18  ALT 21  ALKPHOS 62  BILITOT 1.5*  PROT 7.7  ALBUMIN 4.5   Recent Labs  Lab 11/03/18 0359  LIPASE 18   Recent Labs  Lab 11/03/18 1856  AMMONIA 41*   Coagulation Profile: No results for input(s): INR, PROTIME in the last 168 hours. Cardiac Enzymes: Recent Labs  Lab 11/03/18 0359  CKTOTAL 96   BNP (last 3 results)  No results for input(s): PROBNP in the last 8760 hours. HbA1C: Recent Labs    11/03/18 0912  HGBA1C 7.9*   CBG: Recent Labs  Lab 11/03/18 1755 11/03/18 2130 11/04/18 0051 11/04/18 0359 11/04/18 0749  GLUCAP 113* 147* 122* 93 130*   Lipid Profile: No results for input(s): CHOL, HDL, LDLCALC, TRIG, CHOLHDL, LDLDIRECT in the last 72 hours. Thyroid Function Tests: Recent Labs    11/03/18 0912  TSH 0.612  FREET4 0.99   Anemia Panel: Recent Labs    11/03/18 0912  VITAMINB12 2,165*   Sepsis Labs: Recent Labs  Lab 11/03/18 0941  LATICACIDVEN 1.4    Recent Results (from the past 240 hour(s))  Urine culture     Status: Abnormal   Collection Time: 11/03/18  3:41 AM   Specimen: Urine, Clean Catch  Result Value Ref Range Status   Specimen Description   Final    URINE, CLEAN CATCH Performed at [facility], [place] [address], [place], [place] [address]    Special Requests   Final    NONE Performed at [facility], [place] [address] [place], [place] [address]    Culture MULTIPLE SPECIES PRESENT, SUGGEST RECOLLECTION (A)  Final    Report Status 11/04/2018 FINAL  Final  SARS CORONAVIRUS 2 (TAT 6-24 HRS) Nasopharyngeal Nasopharyngeal Swab     Status: None   Collection Time: 11/03/18  5:07 AM   Specimen: Nasopharyngeal Swab  Result Value Ref Range Status   SARS Coronavirus 2 NEGATIVE NEGATIVE Final    Comment: (NOTE) SARS-CoV-2 target nucleic acids are NOT DETECTED. The SARS-CoV-2 RNA is generally detectable in upper and lower respiratory specimens during the acute phase of infection. Negative results do not preclude SARS-CoV-2 infection, do not rule out co-infections with other pathogens, and should not be used as the sole basis for treatment or other patient management decisions. Negative results must be combined with clinical observations, patient history, and epidemiological information. The expected result is Negative. Fact Sheet for Patients: SugarRoll.be Fact Sheet for Healthcare Providers: https://www.woods-mathews.com/ This test is not yet approved or cleared by the [place] FDA and  has been authorized for detection and/or diagnosis of SARS-CoV-2 by FDA under an Emergency Use Authorization (EUA). This EUA will remain  in effect (meaning this test can be used) for the duration of the COVID-19 declaration under Section 56 4(b)(1) of the Act, 21 U.S.C. section 360bbb-3(b)(1), unless the authorization is terminated or revoked sooner. Performed at [facility], [place] [address] [place], [place] [address]      Radiology Studies: Ct Head Wo Contrast  Result Date: 11/03/2018 CLINICAL DATA:  Head trauma. Nausea and vomiting. EXAM: CT HEAD WITHOUT CONTRAST TECHNIQUE: Contiguous axial images were obtained from the base of the skull through the vertex without intravenous contrast. COMPARISON:  None. FINDINGS: Brain: No acute intracranial hemorrhage. No focal mass lesion. No CT evidence of acute infarction. No midline shift or mass effect. No hydrocephalus.  Basilar cisterns are patent. There are periventricular and subcortical white matter hypodensities. Generalized cortical atrophy. Chronic deep white matter infarction in the RIGHT external capsule Vascular: No hyperdense vessel or unexpected calcification. Skull: Normal. Negative for fracture or focal lesion. Sinuses/Orbits: Paranasal sinuses and mastoid air cells are clear. Orbits are clear. Other: None. IMPRESSION: 1. No acute intracranial findings. 2. Atrophy and white matter microvascular disease. 3. Chronic deep white matter infarction in the RIGHT external capsule. Electronically Signed   By: Suzy Bouchard M.D.   On: 11/03/2018  06:05   Mr Brain Wo Contrast  Result Date: 11/03/2018 CLINICAL DATA:  Altered mental status. EXAM: MRI HEAD WITHOUT CONTRAST TECHNIQUE: Multiplanar, multiecho pulse sequences of the brain and surrounding structures were obtained without intravenous contrast. COMPARISON:  Head CT 11/03/2018 FINDINGS: The examination had to be discontinued prior to completion due to patient confusion and combativeness. Axial and coronal diffusion, sagittal T1, axial T2, and axial FLAIR sequences were obtained and are overall severely motion degraded. Brain: There are scattered small foci of trace diffusion weighted signal hyperintensity in suggestive of acute infarcts including in the basal ganglia regions bilaterally, thalami, anterior body of the corpus callosum on the left, and pons. No gross intracranial hemorrhage, intracranial mass effect, or extra-axial fluid collection is identified. Moderate cerebral atrophy is advanced for age. Chronic infarcts are present in the basal ganglia bilaterally. Vascular: Major intracranial vascular flow voids are grossly preserved. Skull and upper cervical spine: No destructive skull lesion. Sinuses/Orbits: Unremarkable orbits. Paranasal sinuses and mastoid air cells are clear. Other: None. IMPRESSION: 1. Incomplete, severely motion degraded examination. 2.  Scattered small acute infarcts in both cerebral hemispheres and brainstem. 3. Chronic basal ganglia infarcts and moderately advanced cerebral atrophy. Electronically Signed   By: Logan Bores M.D.   On: 11/03/2018 12:48   Dg Chest Port 1 View  Result Date: 11/03/2018 CLINICAL DATA:  Altered mental status EXAM: PORTABLE CHEST 1 VIEW COMPARISON:  None. FINDINGS: Normal mediastinum and cardiac silhouette. Normal pulmonary vasculature. No evidence of effusion, infiltrate, or pneumothorax. No acute bony abnormality. Multiple small external densities in garment. IMPRESSION: No acute cardiopulmonary process. Electronically Signed   By: Suzy Bouchard M.D.   On: 11/03/2018 06:11   Dg Abd Portable 1v  Result Date: 11/03/2018 CLINICAL DATA:  Weakness. EXAM: PORTABLE ABDOMEN - 1 VIEW COMPARISON:  None. FINDINGS: The bowel gas pattern is normal. No radio-opaque calculi or other significant radiographic abnormality are seen. IMPRESSION: Negative. Electronically Signed   By: Marijo Conception M.D.   On: 11/03/2018 09:01   Vas US Carotid (at [place] Only)  Result Date: 11/04/2018 Carotid Arterial Duplex Study Indications:       CVA. Risk Factors:      Hypertension, Diabetes. Other Factors:     CHF. Comparison Study:  No prior study on file for comparison Performing Technologist: Sharion Dove RVS  Examination Guidelines: A complete evaluation includes B-mode imaging, spectral Doppler, color Doppler, and power Doppler as needed of all accessible portions of each vessel. Bilateral testing is considered an integral part of a complete examination. Limited examinations for reoccurring indications may be performed as noted.  Right Carotid Findings: +----------+--------+--------+--------+------------------+------------------+             PSV cm/s EDV cm/s Stenosis Plaque Description Comments            +----------+--------+--------+--------+------------------+------------------+  CCA Prox   60       10                                    intimal thickening  +----------+--------+--------+--------+------------------+------------------+  CCA Distal 67       11                                   intimal thickening  +----------+--------+--------+--------+------------------+------------------+  ICA Prox   89       13  heterogenous                           +----------+--------+--------+--------+------------------+------------------+  ICA Distal 81       18                                                       +----------+--------+--------+--------+------------------+------------------+  ECA        143      13                                                       +----------+--------+--------+--------+------------------+------------------+ +----------+--------+-------+--------+-------------------+             PSV cm/s EDV cms Describe Arm Pressure (mmHG)  +----------+--------+-------+--------+-------------------+  Subclavian 85                                             +----------+--------+-------+--------+-------------------+ +---------+--------+--+--------+-+  Vertebral PSV cm/s 55 EDV cm/s 7  +---------+--------+--+--------+-+  Left Carotid Findings: +----------+--------+--------+--------+------------------+------------------+             PSV cm/s EDV cm/s Stenosis Plaque Description Comments            +----------+--------+--------+--------+------------------+------------------+  CCA Prox   69       14                                   intimal thickening  +----------+--------+--------+--------+------------------+------------------+  CCA Distal 69       12                                   intimal thickening  +----------+--------+--------+--------+------------------+------------------+  ICA Prox   55       11                homogeneous                            +----------+--------+--------+--------+------------------+------------------+  ICA Distal 72       19                                                        +----------+--------+--------+--------+------------------+------------------+  ECA        102      9                                                        +----------+--------+--------+--------+------------------+------------------+ +----------+--------+--------+--------+-------------------+             PSV cm/s EDV cm/s Describe Arm Pressure (  mmHG)  +----------+--------+--------+--------+-------------------+  Subclavian 49                                              +----------+--------+--------+--------+-------------------+ +---------+--------+--+--------+-+  Vertebral PSV cm/s 45 EDV cm/s 7  +---------+--------+--+--------+-+  Summary: Right Carotid: The extracranial vessels were near-normal with only minimal wall                thickening or plaque. Left Carotid: The extracranial vessels were near-normal with only minimal wall               thickening or plaque. Vertebrals:  Bilateral vertebral arteries demonstrate antegrade flow. Subclavians: Normal flow hemodynamics were seen in bilateral subclavian              arteries. *See table(s) above for measurements and observations.  Electronically signed by Antony Contras MD on 11/04/2018 at 11:25:08 AM.    Final     Scheduled Meds:   stroke: mapping our early stages of recovery book   Does not apply Once   aspirin  300 mg Rectal Daily   Or   aspirin  325 mg Oral Daily   atorvastatin  80 mg Oral q1800   enoxaparin (LOVENOX) injection  40 mg Subcutaneous Q24H   insulin aspart  0-15 Units Subcutaneous TID WC   insulin aspart  0-5 Units Subcutaneous QHS   potassium chloride  20 mEq Oral TID   Continuous Infusions:  cefTRIAXone (ROCEPHIN)  IV 1 g (11/03/18 2145)   lactated ringers 125 mL/hr at 11/04/18 0755   thiamine injection 500 mg (11/04/18 1125)     LOS: 1 day   Marylu Lund, MD [facility] Pager On Amion  If 7PM-7AM, please contact night-coverage 11/04/2018, 3:13 PM

## 2018-11-04 NOTE — Progress Notes (Signed)
VASCULAR LAB PRELIMINARY  PRELIMINARY  PRELIMINARY  PRELIMINARY  Carotid duplex completed.    Preliminary report:  See CV proc for preliminary results.   Kayleen Alig, RVT 11/04/2018, 10:30 AM

## 2018-11-04 NOTE — Progress Notes (Signed)
STROKE TEAM PROGRESS NOTE   HISTORY OF PRESENT ILLNESS (per record) Becky Stout is a 63 y.o. female past medical history B12 deficiency, type 2 diabetes not on treatment, hypertension, obesity, and WPW syndrome, brought into [facility] emergency room for increasing confusion as well as failure to thrive and a fall.  She lives alone, family members check on her regular basis, she would he depend eat her food and depends on them bringing the food etc. Patient's family noted that she had been becoming more confused over the past few days, that was sitting in the refrigerator for long time and not acting like herself.  She was found down after she had a fall and could not get her up. Patient is unable to provide any reliable history.  No family was available at bedside. At this time, patient almost is confabulating. Work-up was done in the form of imaging- chest x-ray and head CT unremarkable.  MRI showed scattered embolic strokes for which she was transferred to [facility] for further work-up.  Also found to be in DKA and possible have UTI.   LKW: Unclear last known normal tpa given?: no, unclear last known normal Premorbid modified Rankin scale (mRS):3   INTERVAL HISTORY She is lying comfortably in bed.  Vital signs are stable.  She is afebrile.  She is confused and disoriented.  She moves all 4 extremities but left side less than right    OBJECTIVE Vitals:   11/03/18 2318 11/04/18 0408 11/04/18 0751 11/04/18 0905  BP: (!) 160/90 (!) 165/73 (!) 187/72 (!) 187/74  Pulse:   78   Resp: 17 20 16 18   Temp: 97.6 F (36.4 C) 98.5 F (36.9 C) 98 F (36.7 C)   TempSrc: Oral Oral Oral   SpO2: 96% 98%      CBC:  Recent Labs  Lab 11/03/18 0359 11/04/18 0337  WBC 12.9* 10.4  NEUTROABS 11.2* 7.8*  HGB 14.6 13.5  HCT 43.5 39.0  MCV 91.2 90.3  PLT 343 [id number]    Basic Metabolic Panel:  Recent Labs  Lab 11/03/18 0912 11/03/18 1041 11/03/18 1530 11/04/18 0337  NA 138 140  138  --   K 3.5 3.3* 3.0*  --   CL 104 106 108  --   CO2 17* 18* 18*  --   GLUCOSE 179* 174* 67*  --   BUN 10 9 8   --   CREATININE 0.64 0.68 0.62  --   CALCIUM 9.2 9.2 8.8*  --   MG 1.9  --   --  1.8    Lipid Panel:     Component Value Date/Time   CHOL 268 (H) 05/26/2017 0937   TRIG 109.0 05/26/2017 0937   HDL 83.30 05/26/2017 0937   CHOLHDL 3 05/26/2017 0937   VLDL 21.8 05/26/2017 0937   LDLCALC 163 (H) 05/26/2017 0937   HgbA1c:  Lab Results  Component Value Date   HGBA1C 7.9 (H) 11/03/2018   Urine Drug Screen:     Component Value Date/Time   LABOPIA NONE DETECTED 11/03/2018 1207   COCAINSCRNUR NONE DETECTED 11/03/2018 1207   LABBENZ NONE DETECTED 11/03/2018 1207   AMPHETMU NONE DETECTED 11/03/2018 1207   THCU NONE DETECTED 11/03/2018 1207   LABBARB NONE DETECTED 11/03/2018 1207    Alcohol Level No results found for: ETH  IMAGING  Ct Head Wo Contrast 11/03/2018 IMPRESSION:  1. No acute intracranial findings. 2. Atrophy and white matter microvascular disease. 3. Chronic deep white matter infarction in  the RIGHT external capsule.  Mr Brain Wo Contrast 11/03/2018 IMPRESSION:  1. Incomplete, severely motion degraded examination.  2. Scattered small acute infarcts in both cerebral hemispheres and brainstem.  3. Chronic basal ganglia infarcts and moderately advanced cerebral atrophy.   Dg Chest Port 1 View 11/03/2018 IMPRESSION:  No acute cardiopulmonary process.   Dg Abd Portable 1v 11/03/2018 IMPRESSION:  Negative.    Vas US Carotid (at [place] Only) 11/04/2018 Summary:  Right Carotid: The extracranial vessels were near-normal with only minimal wall thickening or plaque.  Left Carotid: The extracranial vessels were near-normal with only minimal wall thickening or plaque.  Vertebrals:  Bilateral vertebral arteries demonstrate antegrade flow.  Subclavians: Normal flow hemodynamics were seen in bilateral subclavian arteries.   Preliminary      Transthoracic Echocardiogram  00/00/2020 Pending No results found for this or any previous visit (from the past 43800 hour(s)).   ECG - SR rate 95 BPM. (See cardiology reading for complete details)   PHYSICAL EXAM Blood pressure (!) 187/74, pulse 78, temperature 98 F (36.7 C), temperature source Oral, resp. rate 18, SpO2 98 %. Middle-aged Caucasian lady who appears not to be in distress. . Afebrile. Head is nontraumatic. Neck is supple without bruit.    Cardiac exam no murmur or gallop. Lungs are clear to auscultation. Distal pulses are well felt.  Neurological Exam :  She is awake alert disoriented to time place and person.  Speech is slurred.  She does follow simple commands.  Extraocular movements appear full range.  She blinks to threat bilaterally.  Left lower facial weakness.  Tongue midline.  Left hemiparesis 2/5 strength but able to withdraw against gravity.  Normal strength on the right.  Sensation was intact.  Plantars are downgoing.  Gait not tested.       ASSESSMENT/PLAN [name] is a 63 y.o. female with history of B12 deficiency, type 2 diabetes not on treatment, hx of TBI, CHF, hypertension, obesity, SVT and WPW syndrome, brought into [place] [facility] emergency room for increasing confusion as well as failure to thrive and a fall. She did not receive IV t-PA due to unknown time of onset.   Stroke: Scattered small acute subcortical infarcts in both cerebral hemispheres and brainstem - multiple old infarcts -likely lacunar infarcts in setting of dehydration and diabetic ketoacidosis and UTI resultant dysarthria and left hemiparesis  Code Stroke CT Head - not ordered  CT head - No acute intracranial findings. Atrophy and white matter microvascular disease. Chronic deep white matter infarction in the RIGHT external capsule.  MRI head -  Incomplete, severely motion degraded. Scattered small acute infarcts in both cerebral hemispheres and brainstem. Chronic basal  ganglia infarcts and moderately advanced cerebral atrophy.   MRA head - not ordered  CTA H&N - not ordered  CT Perfusion - not ordered  Carotid Doppler - unremarkable  2D Echo - pending  Sars Corona Virus 2 - negative  LDL - 163  HgbA1c - 7.9  UDS - negative  VTE prophylaxis - Lovenox Diet  Diet Order            Diet Carb Modified Fluid consistency: Thin; Room service appropriate? Yes  Diet effective now              aspirin 81 mg daily prior to admission, now on aspirin 81mg  daily and Plavix 75 mg daily for 3 weeks followed by Plavix alone  Patient counseled to be compliant with her antithrombotic medications  Ongoing  aggressive stroke risk factor management  Therapy recommendations:  pending  Disposition:  Pending  Hypertension  Home BP meds:  none  Current BP meds:  none  Blood pressure somewhat high at times but within post stroke/TIA parameters . Permissive hypertension (OK if < 220/120) but gradually normalize in 5-7 days   . Long-term BP goal normotensive  Hyperlipidemia  Home Lipid lowering medication: none  LDL 163, goal < 70  Current lipid lowering medication: Lipitor 80 mg daily   Continue statin at discharge  Diabetes  Home diabetic meds:  Current diabetic meds: SSI  HgbA1c 7.9, goal < 7.0 Recent Labs    11/04/18 0051 11/04/18 0359 11/04/18 0749  GLUCAP 122* 93 130*    Other Stroke Risk Factors  Advanced age  Obesity, 31.5 BMI., recommend weight loss, diet and exercise as appropriate   Congestive Heart Failure  Multiple old infarcts by imaging  Other Active Problems  Mild Leukocytosis - 12.9->10.4 (afebrile)  Hypokalemia - 3.5->3.3->3.0 (will supplement with WPW hx)  UTI - Rocephin started 11/03/18  PLAN Aspirin and Plavix for 3 weeks followed by Plavix alone.  Aggressive risk factor modification.  Physical occupational therapy speech therapy consults.  Hospital day # 1 I have personally obtained  history,examined this patient, reviewed notes, independently viewed imaging studies, participated in medical decision making and plan of care.ROS completed by me personally and pertinent positives fully documented  I have made any additions or clarifications directly to the above note.  Patient has presented with multiple by cerebral anterior and brainstem lacunar infarcts in the setting of dehydration, diabetic ketoacidosis and UTI.  Recommend aspirin and Plavix for 3 weeks followed by Plavix alone.  Continue ongoing stroke work-up and aggressive risk factor modification.  Physical occupational therapy speech therapy consults.  Discussed with Dr.Chiu and answered questions. Greater than 50% time during this 35-minute visit was spent on counseling and coordination of care about her multiple lacunar infarcts and discussion about stroke prevention and treatment and answering questions Antony Contras, MD [facility] Pager: [phone] 11/04/2018 2:50 PM   To contact Stroke Continuity provider, please refer to [email or web address] After hours, contact General Neurology

## 2018-11-04 NOTE — Progress Notes (Signed)
  Echocardiogram 2D Echocardiogram has been performed.  Johny Chess 11/04/2018, 3:37 PM

## 2018-11-04 NOTE — Progress Notes (Signed)
OT Cancellation Note  Patient Details Name: Becky Stout MRN: [id number] DOB: [birth year]   Cancelled Treatment:    Reason Eval/Treat Not Completed: Patient's level of consciousness(Pt resisting all movements and recently had enema today.) OT to evaluate when pt is more appropriate for OOB mobility and ADL. Pt's RN unable to get pt to follow many commands without resisting all movements.  Ebony Hail Harold Hedge) Marsa Aris OTR/L Acute Rehabilitation Services Pager: [phone] Office: [place] 11/04/2018, 2:56 PM

## 2018-11-04 NOTE — Progress Notes (Addendum)
PT Cancellation Note  Patient Details Name: JALONI OSTERGREN MRN: [id number] DOB: [birth year]   Cancelled Treatment:    Reason Eval/Treat Not Completed: Other (comment) Pt not participatory in examination, keeping eyes closed and refusing out of bed mobility; actively resisting PT even positioning in bed.   [name], PT, DPT Acute Rehabilitation Services Pager [phone] Office [phone]    [name] 11/04/2018, 2:45 PM

## 2018-11-05 ENCOUNTER — Inpatient Hospital Stay (HOSPITAL_COMMUNITY): Payer: Self-pay

## 2018-11-05 ENCOUNTER — Other Ambulatory Visit: Payer: Self-pay | Admitting: *Deleted

## 2018-11-05 DIAGNOSIS — I639 Cerebral infarction, unspecified: Secondary | ICD-10-CM

## 2018-11-05 LAB — BASIC METABOLIC PANEL
Anion gap: 15 (ref 5–15)
BUN: 5 mg/dL — ABNORMAL LOW (ref 6–20)
CO2: 16 mmol/L — ABNORMAL LOW (ref 22–32)
Calcium: 8.8 mg/dL — ABNORMAL LOW (ref 8.9–10.3)
Chloride: 104 mmol/L (ref 98–111)
Creatinine, Ser: 0.64 mg/dL (ref 0.44–1.00)
GFR calc Af Amer: >60 mL/min (ref >60)
GFR calc non Af Amer: >60 mL/min (ref >60)
Glucose, Bld: 155 mg/dL — ABNORMAL HIGH (ref 70–99)
Potassium: 3.4 mmol/L — ABNORMAL LOW (ref 3.5–5.1)
Sodium: 135 mmol/L (ref 135–145)

## 2018-11-05 LAB — MAGNESIUM: Magnesium: 1.6 mg/dL — ABNORMAL LOW (ref 1.7–2.4)

## 2018-11-05 LAB — URINE CULTURE: Culture: NO GROWTH

## 2018-11-05 LAB — GLUCOSE, CAPILLARY
Glucose-Capillary: 147 mg/dL — ABNORMAL HIGH (ref 70–99)
Glucose-Capillary: 113 mg/dL — ABNORMAL HIGH (ref 70–99)
Glucose-Capillary: 141 mg/dL — ABNORMAL HIGH (ref 70–99)
Glucose-Capillary: 183 mg/dL — ABNORMAL HIGH (ref 70–99)

## 2018-11-05 MED ORDER — MAGNESIUM SULFATE 2 GM/50ML IV SOLN
2.0000 g | Freq: Once | INTRAVENOUS | Status: AC
  Administered 2018-11-05: 2 g via INTRAVENOUS
  Filled 2018-11-05: qty 50

## 2018-11-05 MED ORDER — PRO-STAT SUGAR FREE PO LIQD
30.0000 mL | Freq: Three times a day (TID) | ORAL | Status: DC
  Administered 2018-11-05 – 2019-02-02 (×257): 30 mL via ORAL
  Filled 2018-11-05 (×260): qty 30

## 2018-11-05 MED ORDER — CLOPIDOGREL BISULFATE 75 MG PO TABS
75.0000 mg | ORAL_TABLET | Freq: Every day | ORAL | Status: DC
  Administered 2018-11-05 – 2019-02-02 (×90): 75 mg via ORAL
  Filled 2018-11-05 (×90): qty 1

## 2018-11-05 MED ORDER — BOOST / RESOURCE BREEZE PO LIQD CUSTOM
1.0000 | Freq: Three times a day (TID) | ORAL | Status: DC
  Administered 2018-11-05 – 2018-11-23 (×47): 1 via ORAL
  Filled 2018-11-05 (×4): qty 1

## 2018-11-05 MED ORDER — POTASSIUM CHLORIDE CRYS ER 20 MEQ PO TBCR
40.0000 meq | EXTENDED_RELEASE_TABLET | Freq: Once | ORAL | Status: AC
  Administered 2018-11-05: 40 meq via ORAL
  Filled 2018-11-05: qty 2

## 2018-11-05 NOTE — Progress Notes (Signed)
Initial Nutrition Assessment  DOCUMENTATION CODES:   Not applicable  INTERVENTION:  Provide 30 ml Prostat po TID, each supplement provides 100 kcal and 15 grams of protein.   Provide Boost Breeze po TID, each supplement provides 250 kcal and 9 grams of protein.  Monitor magnesium, potassium, and phosphorus daily for at least 3 days, MD to replete as needed, as pt is at risk for refeeding syndrome given FTT.  Recommend obtaining new weight to fully assess weight trends.   NUTRITION DIAGNOSIS:   Inadequate oral intake related to poor appetite as evidenced by per patient/family report.  GOAL:   Patient will meet greater than or equal to 90% of their needs  MONITOR:   PO intake, Supplement acceptance, Skin, Weight trends, Labs, I & O's  REASON FOR ASSESSMENT:   Consult Assessment of nutrition requirement/status  ASSESSMENT:   63 y.o. female with Past medical history of B12 deficiency, type II DM not on therapy, HTN, CHF, IDA, obesity, WPW syndrome.Patient was brought into secondary to increasing confusion as well as failure to thrive and a fall.62 y.o. female with Past medical history of B12 deficiency, type II DM not on therapy, HTN, IDA, obesity, WPW syndrome.Patient was brought into secondary to increasing confusion as well as failure to thrive and a fall. Scattered small acute subcortical infarcts in both cerebral hemispheres and brainstem.  Meal completion 0-50%. Pt reports having a decreased appetite which has been ongoing over the past 1 week. Pt reports usual intake of [employer] or a chicken melt sandwich at meals. Pt usually eats 3 meals a day. Family provides all food for patient. Per MD note, however family reports observing 3 week amount of old food in house. Pt's last full meal PTA unknown. MD is monitoring labs for refeeding syndrome. Pt additionally reports frequent hiccups which deters her from eating. Usual body weight unknown to patient. Noted no new weight  recorded. RD to order nutritional supplements to aid in caloric and protein needs. Pt encouraged to eat her food at meals and to drink her supplements.   Labs and medications reviewed. Magnesium low at 1.6. Potassium low at 3.4.  NUTRITION - FOCUSED PHYSICAL EXAM:    Most Recent Value  Orbital Region  No depletion  Upper Arm Region  No depletion  Thoracic and Lumbar Region  No depletion  Buccal Region  No depletion  Temple Region  No depletion  Clavicle Bone Region  No depletion  Clavicle and Acromion Bone Region  No depletion  Scapular Bone Region  Unable to assess  Dorsal Hand  No depletion  Patellar Region  No depletion  Anterior Thigh Region  No depletion  Posterior Calf Region  No depletion  Edema (RD Assessment)  Mild  Hair  Reviewed  Eyes  Reviewed  Mouth  Reviewed  Skin  Reviewed  Nails  Reviewed       Diet Order:   Diet Order            Diet Carb Modified Fluid consistency: Thin; Room service appropriate? Yes  Diet effective now              EDUCATION NEEDS:   Not appropriate for education at this time  Skin:  Skin Assessment: Reviewed RN Assessment  Last BM:  Unknown  Height:   Ht Readings from Last 1 Encounters:  05/26/17 5\' 4"  (1.626 m)    Weight:   Wt Readings from Last 1 Encounters:  05/26/17 83.2 kg    Ideal Body Weight:  54.5 kg  BMI:  There is no height or weight on file to calculate BMI.  Estimated Nutritional Needs:   Kcal:  1800-2000  Protein:  85-100 grams  Fluid:  >/= 1.8 L/day    Corrin Parker, MS, RD, LDN Pager # [phone] After hours/ weekend pager # [phone]
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
## 2018-11-05 NOTE — Progress Notes (Signed)
TCD       has been completed. Preliminary results can be found under CV proc through chart review. Sultan Pargas, BS, RDMS, RVT   

## 2018-11-05 NOTE — Progress Notes (Signed)
Occupational Therapy Evaluation Patient Details Name: Becky Stout MRN: [id number] DOB: 06-Oct-1955 Today's Date: 11/05/2018    History of Present Illness 63 y.o. female with Past medical history of B12 deficiency, type II DM not on therapy, HTN, IDA, obesity, WPW syndrome.   Clinical Impression   PLOF and home information from chart, PTA, pt was living at home alone, and was independent with ADL/IADL and functional mobility. Per chart, her family would bring her groceries. Pt demonstrates cognitive deficits (see cognition section) impacting safety and independence with ADL and functional mobility. Pt currently requires minA for sequencing through oral care activity while seated in supported bed level position. Pt demonstrates anxious behaviors with bed movement. Due to decline in current level of function, pt would benefit from acute OT to address established goals to facilitate safe D/C to venue listed below. At this time, recommend SNF follow-up. Will continue to follow acutely.     Follow Up Recommendations  SNF    Equipment Recommendations  Other (comment)(defer to next venue)    Recommendations for Other Services       Precautions / Restrictions Precautions Precautions: Fall Restrictions Weight Bearing Restrictions: No      Mobility Bed Mobility Overal bed mobility: Needs Assistance Bed Mobility: Supine to Sit;Sit to Supine     Supine to sit: Mod assist;+2 for physical assistance Sit to supine: Min assist   General bed mobility comments: deferred due to pt's increased fear of falling;increased anxiety with bed movement  Transfers Overall transfer level: Needs assistance Equipment used: 2 person hand held assist Transfers: Sit to/from Stand Sit to Stand: Max assist;+2 physical assistance         General transfer comment: deferred    Balance Overall balance assessment: Needs assistance Sitting-balance support: Bilateral upper extremity supported;Feet  supported Sitting balance-Leahy Scale: Fair Sitting balance - Comments: minG for static sitting balance   Standing balance support: Bilateral upper extremity supported Standing balance-Leahy Scale: Zero Standing balance comment: max-A x 2                           ADL either performed or assessed with clinical judgement   ADL Overall ADL's : Needs assistance/impaired Eating/Feeding: Set up;Sitting   Grooming: Minimal assistance;Oral care;Sitting;Bed level Grooming Details (indicate cue type and reason): completed sitting in bed;required vc for sequencing Upper Body Bathing: Minimal assistance;Sitting   Lower Body Bathing: Maximal assistance   Upper Body Dressing : Maximal assistance   Lower Body Dressing: Maximal assistance                 General ADL Comments: deferred functional mobility due to pt's cognition and for pt/therapist safety;pt required vc for sequencing during ADL     Vision Baseline Vision/History: Wears glasses Wears Glasses: (unsure) Patient Visual Report: No change from baseline Vision Assessment?: Yes Eye Alignment: Within Functional Limits Ocular Range of Motion: Within Functional Limits Tracking/Visual Pursuits: Decreased smoothness of horizontal tracking;Decreased smoothness of vertical tracking;Requires cues, head turns, or add eye shifts to track Saccades: Additional head turns occurred during testing;Decreased speed of saccadic movement Convergence: Impaired (comment)(left eye decreased ability to track/focus medially) Visual Fields: No apparent deficits Additional Comments: continue to assess functionally;pt reports she wears glasses at baseline but unclear if for reading or all the time;pt demonstrates difficulty with L eye tracking medially     Perception     Praxis      Pertinent Vitals/Pain Pain Assessment: Faces Faces Pain Scale:  Hurts a [place] bit Pain Location: feet Pain Descriptors / Indicators: Sore Pain  Intervention(s): Monitored during session     Hand Dominance Left   Extremity/Trunk Assessment Upper Extremity Assessment Upper Extremity Assessment: RUE deficits/detail;LUE deficits/detail;Difficult to assess due to impaired cognition(pt demonstrates difficulty following commands) RUE Deficits / Details: 3+/5 for elbow flexion/extension;decreased fine motor coordination during finger opposition and diadokineses;difficult to assess due to pt pulling away from therapist tough despite communication and permission from pt to allow touch RUE Sensation: WNL RUE Coordination: decreased fine motor;decreased gross motor LUE Deficits / Details: 3/5 for elbow flexion/extension;limited assessment due to cognition;difficulty with finger to thumb opposition  LUE Sensation: WNL LUE Coordination: decreased fine motor;decreased gross motor   Lower Extremity Assessment Lower Extremity Assessment: Defer to PT evaluation   Cervical / Trunk Assessment Cervical / Trunk Assessment: Normal   Communication Communication Communication: No difficulties   Cognition Arousal/Alertness: Awake/alert Behavior During Therapy: Anxious Overall Cognitive Status: No family/caregiver present to determine baseline cognitive functioning(appears significantly altered but unsure of baseline) Area of Impairment: Orientation;Attention;Memory;Following commands;Safety/judgement;Awareness;Problem solving                 Orientation Level: Disoriented to;Time Current Attention Level: Focused Memory: Decreased recall of precautions;Decreased short-term memory Following Commands: Follows one step commands inconsistently;Follows one step commands with increased time Safety/Judgement: Decreased awareness of safety;Decreased awareness of deficits Awareness: Intellectual Problem Solving: Slow processing;Decreased initiation;Difficulty sequencing;Requires verbal cues;Requires tactile cues General Comments: pt demonstrates  anxious expressions/reactions with any movement of bed or touch from therapist during MMT, depsite warning and communication of touch/movement with pt's approval. Pt demonstrates decreased sequencinng duirng oral care, requiring vc from therapist to initiate brushing teeth (dip toothbrush into mouthwash, put toothbrush in mouth, brush teeth);pt with STM deficits, repeating appreciation for blanket numerous times during session;   General Comments  VSS throughout session    Exercises     Shoulder Instructions      Home Living Family/patient expects to be discharged to:: Private residence Living Arrangements: Alone Available Help at Discharge: Family;Available PRN/intermittently(sister once every few weeks) Type of Home: Apartment Home Access: Level entry     Home Layout: One level     Bathroom Shower/Tub: Teacher, early years/pre: Standard     Home Equipment: Cane - single point      Lives With: Alone    Prior Functioning/Environment Level of Independence: Independent with assistive device(s)        Comments: independent with cane household distances        OT Problem List: Decreased strength;Decreased activity tolerance;Impaired balance (sitting and/or standing);Decreased cognition;Decreased safety awareness;Decreased knowledge of precautions      OT Treatment/Interventions: Self-care/ADL training;Therapeutic exercise;DME and/or AE instruction;Therapeutic activities;Cognitive remediation/compensation;Visual/perceptual remediation/compensation;Patient/family education;Balance training    OT Goals(Current goals can be found in the care plan section) Acute Rehab OT Goals Patient Stated Goal: pt did not state OT Goal Formulation: With patient Time For Goal Achievement: 11/19/18 Potential to Achieve Goals: Good ADL Goals Pt Will Perform Grooming: sitting;with supervision;standing Pt Will Perform Upper Body Dressing: with supervision;sitting Pt Will Perform  Lower Body Dressing: with supervision;sit to/from stand Pt Will Transfer to Toilet: with supervision;ambulating Additional ADL Goal #1: Pt will follow multi-step commands consistently. Additional ADL Goal #2: Pt will sequence through familiar 5 step ADL independently.  OT Frequency: Min 2X/week   Barriers to D/C: Decreased caregiver support  pt lives alone       Co-evaluation  AM-PAC OT "6 Clicks" Daily Activity     Outcome Measure Help from another person eating meals?: A [place] Help from another person taking care of personal grooming?: A [place] Help from another person toileting, which includes using toliet, bedpan, or urinal?: Total Help from another person bathing (including washing, rinsing, drying)?: A Lot Help from another person to put on and taking off regular upper body clothing?: A Lot Help from another person to put on and taking off regular lower body clothing?: A Lot 6 Click Score: 13   End of Session Nurse Communication: Mobility status(bed alarm not working)  Activity Tolerance: Patient tolerated treatment well;Other (comment)(pt limited by cognition) Patient left: in bed;with call bell/phone within reach;Other (comment)(telesitter turned on)  OT Visit Diagnosis: Unsteadiness on feet (R26.81);Other abnormalities of gait and mobility (R26.89);Muscle weakness (generalized) (M62.81);History of falling (Z91.81);Other symptoms and signs involving cognitive function;Adult, failure to thrive (R62.7)                Time: [id number] OT Time Calculation (min): 24 min Charges:  OT General Charges $OT Visit: 1 Visit OT Evaluation $OT Eval Moderate Complexity: 1 Mod OT Treatments $Self Care/Home Management : 8-22 mins  Dorinda Hill OTR/L Acute Rehabilitation Services Office: [place] 11/05/2018, 12:22 PM

## 2018-11-05 NOTE — Progress Notes (Signed)
PROGRESS NOTE    Becky Stout  [id number] DOB: 03/23/1955 DOA: 11/03/2018 PCP: Abner Greenspan, MD    Brief Narrative:  63 y.o. female with Past medical history of B12 deficiency, type II DM not on therapy, HTN, IDA, obesity, WPW syndrome. Patient was brought into secondary to increasing confusion as well as failure to thrive and a fall. There was also report of nausea and vomiting. Patient lives alone, family member brings food to her house on a regular basis.  Patient will heat up the food or freeze and eat. Today when patient's mother had some concerns regarding patient's condition and ask her sister to check on her.  When the sister arrived at home she saw that the patient was on the floor was confused and there were food sitting in the refrigerator which was older than 3 weeks. Patient also reported nausea and vomiting and was unable to stand up on her own after a fall that she had last night. patient does not remember how she fell or whether she passed out or not. On questioning any review of system patient will start talking about nonrelevant subjects and topics. Patient denies any complaints of headache dizziness lightheadedness chest pain abdominal pain nausea at the time of my evaluation diarrhea at home or burning urination. Patient has chronic swelling in her leg. Patient is generally not taking any medications for her medical conditions.  Assessment & Plan:   Principal Problem:   Failure to thrive in adult Active Problems:   Hypothyroidism   B12 deficiency   WOLFF (WOLFE)-PARKINSON-WHITE (WPW) SYNDROME   Essential hypertension   Hyperlipidemia   Poorly controlled type 2 diabetes mellitus (HCC)   Acute lower UTI   High anion gap metabolic acidosis   DKA (diabetic ketoacidoses) (HCC)   Cerebral thrombosis with cerebral infarction  1. Failure to thrive in adult -Presents with confusion with hx of poor PO intake -Have consulted nutrition for further  recs -Currently continued on carb mod diet -Recheck CMP in AM  2.  DKA Poorly controlled diabetes with hyperglycemia -Not taking meds prior to admit -Presenting a1c of 7.9 -Currently continued on SSI coverage alone with stable glucose trends -Cr is <1. Can consider metformin at time of d/c -Repeat lytes in AM  3.  Acute CVA. Acute metabolic encephalopathy. Unwitnessed fall -Multiple cerebral and brainstem lacunar infarcts appreciated -Stroke team following -Discussed case with Dr. Leonie Man. Concern for dehydration in setting of above DKA and below UTI leading to infarcts -Neurology recommendations for ASA and plavix x 3 weeks followed by plavix alone. No need for loop or TEE per Neurology -PT/OT consulted with recs for SNF -LDL of 163. Continued on lipitor  4.  Accelerated hypertension. -Had allowed permissive HTN since admit -Neurology recommends gradually normalizing BP over a period of 5-7 days -If pt remains stable and if renal function stable, consider starting low dose ACEI in the next 24-48hrs, which would aid in renal protection in a diabetic -For now, continue on PRN labetalol for SBP>220 or DBP>120  5.  Hypothyroidism. -Continue Synthroid as tolerated -TSH and free T4 normal. -Stable at present  6.  B12 deficiency. -Currently B12 level significantly elevated. -Presently stable  7.  Acute UTI. -Presenting UA reviewed, and is consistent with UTI -Urine culture is inconclusive.  -Repeat urine culture pending  8.  Intractable nausea and vomiting. -Likely from constipation based on the x-ray. -Will give trial of lactulose -Continue cathartics with goal of normal BM  9. WPW syndrome. -Presenting  EKG unremarkable. -Will cont to monitor on tele -Recheck lytes in AM  10. Hypokalemia -Possible related to above malnutrition -KCl ordered for today -Repeat bmet in AM  DVT prophylaxis: Lovenox subQ Code Status: DNR Family Communication: Pt in room, family  not at bedside Disposition Plan: Uncertain at this time  Consultants:   Neurology/Stroke Team  Procedures:     Antimicrobials: Anti-infectives (From admission, onward)   Start     Dose/Rate Route Frequency Ordered Stop   11/03/18 1930  cefTRIAXone (ROCEPHIN) 1 g in sodium chloride 0.9 % 100 mL IVPB     1 g 200 mL/hr over 30 Minutes Intravenous Every 24 hours 11/03/18 1724     11/03/18 0530  cefTRIAXone (ROCEPHIN) 1 g in sodium chloride 0.9 % 100 mL IVPB     1 g 200 mL/hr over 30 Minutes Intravenous  Once 11/03/18 0525 11/03/18 0629      Subjective: No complaints at this time  Objective: Vitals:   11/04/18 1618 11/04/18 1952 11/05/18 0800 11/05/18 1139  BP: (!) 191/76 (!) 214/83 (!) 198/84 (!) 187/79  Pulse: 81 81 85 87  Resp: 16 19 18 18   Temp: 97.7 F (36.5 C) 98.1 F (36.7 C) 98.6 F (37 C) 98.6 F (37 C)  TempSrc: Oral Oral Oral Oral  SpO2: 100% 97% 99% 100%    Intake/Output Summary (Last 24 hours) at 11/05/2018 1522 Last data filed at 11/05/2018 0915 Gross per 24 hour  Intake 3092.59 ml  Output 650 ml  Net 2442.59 ml   There were no vitals filed for this visit.  Examination: General exam: Awake, laying in bed, in nad Respiratory system: Normal respiratory effort, no wheezing Cardiovascular system: regular rate, s1, s2 Gastrointestinal system: Soft, nondistended, positive BS Central nervous system: CN2-12 grossly intact, strength intact Extremities: Perfused, no clubbing Skin: Normal skin turgor, no notable skin lesions seen Psychiatry: Mood normal // no visual hallucinations   Data Reviewed: I have personally reviewed following labs and imaging studies  CBC: Recent Labs  Lab 11/03/18 0359 11/04/18 0337  WBC 12.9* 10.4  NEUTROABS 11.2* 7.8*  HGB 14.6 13.5  HCT 43.5 39.0  MCV 91.2 90.3  PLT 343 [id number]   Basic Metabolic Panel: Recent Labs  Lab 11/03/18 0359 11/03/18 0912 11/03/18 1041 11/03/18 1530 11/04/18 0337 11/05/18 0419  NA 138  138 140 138  --  135  K 3.4* 3.5 3.3* 3.0*  --  3.4*  CL 102 104 106 108  --  104  CO2 18* 17* 18* 18*  --  16*  GLUCOSE 221* 179* 174* 67*  --  155*  BUN 12 10 9 8   --  5*  CREATININE 0.79 0.64 0.68 0.62  --  0.64  CALCIUM 9.5 9.2 9.2 8.8*  --  8.8*  MG  --  1.9  --   --  1.8 1.6*   GFR: CrCl cannot be calculated (Unknown ideal weight.). Liver Function Tests: Recent Labs  Lab 11/03/18 0359  AST 18  ALT 21  ALKPHOS 62  BILITOT 1.5*  PROT 7.7  ALBUMIN 4.5   Recent Labs  Lab 11/03/18 0359  LIPASE 18   Recent Labs  Lab 11/03/18 1856  AMMONIA 41*   Coagulation Profile: No results for input(s): INR, PROTIME in the last 168 hours. Cardiac Enzymes: Recent Labs  Lab 11/03/18 0359  CKTOTAL 96   BNP (last 3 results) No results for input(s): PROBNP in the last 8760 hours. HbA1C: Recent Labs    11/03/18 0912  HGBA1C 7.9*   CBG: Recent Labs  Lab 11/04/18 1731 11/04/18 2107 11/04/18 2127 11/05/18 0659 11/05/18 1137  GLUCAP 115* 147* 145* 147* 113*   Lipid Profile: No results for input(s): CHOL, HDL, LDLCALC, TRIG, CHOLHDL, LDLDIRECT in the last 72 hours. Thyroid Function Tests: Recent Labs    11/03/18 0912  TSH 0.612  FREET4 0.99   Anemia Panel: Recent Labs    11/03/18 0912  VITAMINB12 2,165*   Sepsis Labs: Recent Labs  Lab 11/03/18 0941  LATICACIDVEN 1.4    Recent Results (from the past 240 hour(s))  Urine culture     Status: Abnormal   Collection Time: 11/03/18  3:41 AM   Specimen: Urine, Clean Catch  Result Value Ref Range Status   Specimen Description   Final    URINE, CLEAN CATCH Performed at [facility], [place] [address] [place], [place] [address]    Special Requests   Final    NONE Performed at [facility], [place] [address], [place], [place] [address]    Culture MULTIPLE SPECIES PRESENT, SUGGEST RECOLLECTION (A)  Final   Report Status 11/04/2018 FINAL  Final  SARS CORONAVIRUS 2 (TAT 6-24 HRS)  Nasopharyngeal Nasopharyngeal Swab     Status: None   Collection Time: 11/03/18  5:07 AM   Specimen: Nasopharyngeal Swab  Result Value Ref Range Status   SARS Coronavirus 2 NEGATIVE NEGATIVE Final    Comment: (NOTE) SARS-CoV-2 target nucleic acids are NOT DETECTED. The SARS-CoV-2 RNA is generally detectable in upper and lower respiratory specimens during the acute phase of infection. Negative results do not preclude SARS-CoV-2 infection, do not rule out co-infections with other pathogens, and should not be used as the sole basis for treatment or other patient management decisions. Negative results must be combined with clinical observations, patient history, and epidemiological information. The expected result is Negative. Fact Sheet for Patients: SugarRoll.be Fact Sheet for Healthcare Providers: https://www.woods-mathews.com/ This test is not yet approved or cleared by the [place] FDA and  has been authorized for detection and/or diagnosis of SARS-CoV-2 by FDA under an Emergency Use Authorization (EUA). This EUA will remain  in effect (meaning this test can be used) for the duration of the COVID-19 declaration under Section 56 4(b)(1) of the Act, 21 U.S.C. section 360bbb-3(b)(1), unless the authorization is terminated or revoked sooner. Performed at [facility], [place] [address], [place], [place] [address]   Culture, blood (routine x 2)     Status: None (Preliminary result)   Collection Time: 11/03/18  1:12 PM   Specimen: BLOOD  Result Value Ref Range Status   Specimen Description   Final    BLOOD LEFT ANTECUBITAL Performed at [place] [address], [place], [place] [address]    Special Requests   Final    BOTTLES DRAWN AEROBIC AND ANAEROBIC Blood Culture adequate volume Performed at [facility] [address], [place], [place] [address]    Culture   Final    NO GROWTH 1  DAY Performed at [facility], [place] [address], [place], [place] [address]    Report Status PENDING  Incomplete     Radiology Studies: Vas US Carotid (at [place] Only)  Result Date: 11/04/2018 Carotid Arterial Duplex Study Indications:       CVA. Risk Factors:      Hypertension, Diabetes. Other Factors:     CHF. Comparison Study:  No prior study on file for comparison  Performing Technologist: Sharion Dove RVS  Examination Guidelines: A complete evaluation includes B-mode imaging, spectral Doppler, color Doppler, and power Doppler as needed of all accessible portions of each vessel. Bilateral testing is considered an integral part of a complete examination. Limited examinations for reoccurring indications may be performed as noted.  Right Carotid Findings: +----------+--------+--------+--------+------------------+------------------+             PSV cm/s EDV cm/s Stenosis Plaque Description Comments            +----------+--------+--------+--------+------------------+------------------+  CCA Prox   60       10                                   intimal thickening  +----------+--------+--------+--------+------------------+------------------+  CCA Distal 67       11                                   intimal thickening  +----------+--------+--------+--------+------------------+------------------+  ICA Prox   89       13                heterogenous                           +----------+--------+--------+--------+------------------+------------------+  ICA Distal 81       18                                                       +----------+--------+--------+--------+------------------+------------------+  ECA        143      13                                                       +----------+--------+--------+--------+------------------+------------------+ +----------+--------+-------+--------+-------------------+             PSV cm/s EDV cms Describe Arm Pressure (mmHG)   +----------+--------+-------+--------+-------------------+  Subclavian 85                                             +----------+--------+-------+--------+-------------------+ +---------+--------+--+--------+-+  Vertebral PSV cm/s 55 EDV cm/s 7  +---------+--------+--+--------+-+  Left Carotid Findings: +----------+--------+--------+--------+------------------+------------------+             PSV cm/s EDV cm/s Stenosis Plaque Description Comments            +----------+--------+--------+--------+------------------+------------------+  CCA Prox   69       14                                   intimal thickening  +----------+--------+--------+--------+------------------+------------------+  CCA Distal 69       12                                   intimal thickening  +----------+--------+--------+--------+------------------+------------------+  ICA Prox   55       11                homogeneous                            +----------+--------+--------+--------+------------------+------------------+  ICA Distal 72       19                                                       +----------+--------+--------+--------+------------------+------------------+  ECA        102      9                                                        +----------+--------+--------+--------+------------------+------------------+ +----------+--------+--------+--------+-------------------+             PSV cm/s EDV cm/s Describe Arm Pressure (mmHG)  +----------+--------+--------+--------+-------------------+  Subclavian 49                                              +----------+--------+--------+--------+-------------------+ +---------+--------+--+--------+-+  Vertebral PSV cm/s 45 EDV cm/s 7  +---------+--------+--+--------+-+  Summary: Right Carotid: The extracranial vessels were near-normal with only minimal wall                thickening or plaque. Left Carotid: The extracranial vessels were near-normal with only minimal wall                thickening or plaque. Vertebrals:  Bilateral vertebral arteries demonstrate antegrade flow. Subclavians: Normal flow hemodynamics were seen in bilateral subclavian              arteries. *See table(s) above for measurements and observations.  Electronically signed by Antony Contras MD on 11/04/2018 at 11:25:08 AM.    Final    Vas [place] Transcranial Doppler  Result Date: 11/05/2018  Transcranial Doppler Indications: Stroke. Performing Technologist: June Leap RDMS, RVT  Examination Guidelines: A complete evaluation includes B-mode imaging, spectral Doppler, color Doppler, and power Doppler as needed of all accessible portions of each vessel. Bilateral testing is considered an integral part of a complete examination. Limited examinations for reoccurring indications may be performed as noted.  +----------+-------------+----------+-----------+-------+  RIGHT TCD  Right VM (cm) Depth (cm) Pulsatility Comment  +----------+-------------+----------+-----------+-------+  MCA            43.00                   1.47              +----------+-------------+----------+-----------+-------+  ACA           -37.00                   1.62              +----------+-------------+----------+-----------+-------+  Term ICA      -19.00                   1.26              +----------+-------------+----------+-----------+-------+  PCA            33.00                   1.67              +----------+-------------+----------+-----------+-------+  Opthalmic      26.00                   1.71              +----------+-------------+----------+-----------+-------+  ICA siphon     32.00                   1.63              +----------+-------------+----------+-----------+-------+  Vertebral     -23.00                   1.03              +----------+-------------+----------+-----------+-------+  +----------+------------+----------+-----------+--------------+  LEFT TCD   Left VM (cm) Depth (cm) Pulsatility    Comment       +----------+------------+----------+-----------+--------------+  MCA                                            not visualized  +----------+------------+----------+-----------+--------------+  ACA                                            not visualized  +----------+------------+----------+-----------+--------------+  Term ICA                                       not visualized  +----------+------------+----------+-----------+--------------+  PCA           28.00                   1.74                     +----------+------------+----------+-----------+--------------+  Opthalmic     35.00                   1.48                     +----------+------------+----------+-----------+--------------+  ICA siphon    26.00                   1.36                     +----------+------------+----------+-----------+--------------+  Vertebral     -20.00                  1.39                     +----------+------------+----------+-----------+--------------+  +------------+-------+-------+               VM cm/s Comment  +------------+-------+-------+  Prox Basilar -19.00           +------------+-------+-------+    Preliminary     Scheduled Meds:   stroke: mapping our early stages of recovery book   Does not apply Once   aspirin  300 mg Rectal Daily  Or   aspirin  325 mg Oral Daily   atorvastatin  80 mg Oral q1800   clopidogrel  75 mg Oral Daily   enoxaparin (LOVENOX) injection  40 mg Subcutaneous Q24H   insulin aspart  0-15 Units Subcutaneous TID WC   insulin aspart  0-5 Units Subcutaneous QHS   Continuous Infusions:  cefTRIAXone (ROCEPHIN)  IV 1 g (11/04/18 2117)   lactated ringers 125 mL/hr at 11/05/18 1225   thiamine injection 500 mg (11/05/18 1028)     LOS: 2 days   Marylu Lund, MD [facility] Pager On Amion  If 7PM-7AM, please contact night-coverage 11/05/2018, 3:22 PM

## 2018-11-05 NOTE — TOC Initial Note (Addendum)
Transition of Care [facility]) - Initial/Assessment Note    Patient Details  Name: Becky Stout MRN: [id number] Date of Birth: 1955/10/07  Transition of Care [facility]) CM/SW Contact:    Pollie Friar, RN Phone Number: 11/05/2018, 11:33 AM  Clinical Narrative:                 Pt living at home alone. Pt doesn't have any insurance and states she hasnt seen Dr Glori Bickers in a long time. Pt agreeable to being set up with a [facility]. When questioned about home medications she stated the hospital delivers her meds.  (shes only on over the counter meds) Pt states her sister comes every other week and brings her groceries.  When asked about transportation she states her sister would be the one to take her to appointments.  Recommendations are for SNF. CM will update the patient and inquire about speaking to her sister.   1547: Addendum; CM spoke to patient's mother: June on the phone. Per June theres isn't a family member that can provided the needed supervision and support needed at home. CSW updated.   Expected Discharge Plan: Skilled Nursing Facility Barriers to Discharge: Continued Medical Work up, Inadequate or no insurance   Patient Goals and CMS Choice   CMS Medicare.gov Compare Post Acute Care list provided to:: Patient Choice offered to / list presented to : Patient, Sibling  Expected Discharge Plan and Services Expected Discharge Plan: [place] In-house Referral: Clinical Social Work Discharge Planning Services: CM Consult Post Acute Care Choice: [place] Living arrangements for the past 2 months: [place]                                      Prior Living Arrangements/Services Living arrangements for the past 2 months: Single Family Home Lives with:: Self Patient language and need for interpreter reviewed:: Yes(no needs) Do you feel safe going back to the place where you live?: Yes      Need for Family Participation in Patient  Care: Yes (Comment)(24 hour supervision) Care giver support system in place?: No (comment)(sister comes every couple of weeks to bring groceries) Current home services: DME(cane) Criminal Activity/Legal Involvement Pertinent to Current Situation/Hospitalization: No - Comment as needed  Activities of Daily Living Home Assistive Devices/Equipment: None ADL Screening (condition at time of admission) Patient's cognitive ability adequate to safely complete daily activities?: No Is the patient deaf or have difficulty hearing?: No Does the patient have difficulty seeing, even when wearing glasses/contacts?: No Does the patient have difficulty concentrating, remembering, or making decisions?: Yes Patient able to express need for assistance with ADLs?: No Does the patient have difficulty dressing or bathing?: Yes Independently performs ADLs?: No Communication: Independent Dressing (OT): Needs assistance Is this a change from baseline?: Pre-admission baseline Feeding: Independent Bathing: Needs assistance Is this a change from baseline?: Change from baseline, expected to last <3 days Toileting: Needs assistance Is this a change from baseline?: Change from baseline, expected to last <3 days In/Out Bed: Needs assistance Is this a change from baseline?: Change from baseline, expected to last <3 days Walks in Home: Needs assistance Is this a change from baseline?: Change from baseline, expected to last <3 days Does the patient have difficulty walking or climbing stairs?: Yes Weakness of Legs: Both Weakness of Arms/Hands: Left(and painful)  Permission Sought/Granted  Emotional Assessment Appearance:: Appears stated age   Affect (typically observed): Depressed Orientation: : Oriented to Self   Psych Involvement: Yes (comment)  Admission diagnosis:  Dehydration [E86.0] Acute cystitis without hematuria [N30.00] Generalized weakness [R53.1] Intractable nausea and  vomiting [R11.2] Non-intractable vomiting with nausea, unspecified vomiting type [R11.2] DKA (diabetic ketoacidoses) ([place]) [E11.10] Patient Active Problem List   Diagnosis Date Noted  . Cerebral thrombosis with cerebral infarction 11/04/2018  . Failure to thrive in adult 11/03/2018  . Acute lower UTI 11/03/2018  . High anion gap metabolic acidosis [id number]  . DKA (diabetic ketoacidoses) ([place]) 11/03/2018  . Anxiety 09/15/2015  . Obesity 05/22/2015  . Hyponatremia 01/10/2014  . Facial rash 06/18/2013  . Joint pain 06/18/2013  . Frequent urination 12/21/2012  . Colon cancer screening 04/27/2012  . Routine general medical examination at a health care facility 04/10/2011  . Other screening mammogram 04/08/2011  . Gluten intolerance 04/08/2011  . Hyperlipidemia 03/24/2010  . Poorly controlled type 2 diabetes mellitus ([place]) 03/24/2010  . MAMMOGRAM, ABNORMAL 03/24/2010  . Tachycardia 03/14/2008  . B12 deficiency 11/12/2007  . Hypothyroidism 11/06/2007  . Essential hypertension 10/26/2007  . WOLFF (WOLFE)-PARKINSON-WHITE (WPW) SYNDROME 04/30/2007  . SUPRAVENTRICULAR TACHYCARDIA 05/18/2006   PCP:  Abner Greenspan, MD Pharmacy:   [facility] [address] [place] [address] [place] [address] Phone: [phone] Fax: [phone]     Social Determinants of Health (SDOH) Interventions    Readmission Risk Interventions No flowsheet data found.

## 2018-11-05 NOTE — Progress Notes (Signed)
STROKE TEAM PROGRESS NOTE       INTERVAL HISTORY She is lying comfortably in bed.  Vital signs are stable except elevated BP.  She is afebrile.  She is confused and disoriented.  She moves all 4 extremities but left side less than right    OBJECTIVE Vitals:   11/04/18 1618 11/04/18 1952 11/05/18 0800 11/05/18 1139  BP: (!) 191/76 (!) 214/83 (!) 198/84 (!) 187/79  Pulse: 81 81 85 87  Resp: 16 19 18 18   Temp: 97.7 F (36.5 C) 98.1 F (36.7 C) 98.6 F (37 C) 98.6 F (37 C)  TempSrc: Oral Oral Oral Oral  SpO2: 100% 97% 99% 100%    CBC:  Recent Labs  Lab 11/03/18 0359 11/04/18 0337  WBC 12.9* 10.4  NEUTROABS 11.2* 7.8*  HGB 14.6 13.5  HCT 43.5 39.0  MCV 91.2 90.3  PLT 343 [id number]    Basic Metabolic Panel:  Recent Labs  Lab 11/03/18 1530 11/04/18 0337 11/05/18 0419  NA 138  --  135  K 3.0*  --  3.4*  CL 108  --  104  CO2 18*  --  16*  GLUCOSE 67*  --  155*  BUN 8  --  5*  CREATININE 0.62  --  0.64  CALCIUM 8.8*  --  8.8*  MG  --  1.8 1.6*    Lipid Panel:     Component Value Date/Time   CHOL 268 (H) 05/26/2017 0937   TRIG 109.0 05/26/2017 0937   HDL 83.30 05/26/2017 0937   CHOLHDL 3 05/26/2017 0937   VLDL 21.8 05/26/2017 0937   LDLCALC 163 (H) 05/26/2017 0937   HgbA1c:  Lab Results  Component Value Date   HGBA1C 7.9 (H) 11/03/2018   Urine Drug Screen:     Component Value Date/Time   LABOPIA NONE DETECTED 11/03/2018 1207   COCAINSCRNUR NONE DETECTED 11/03/2018 1207   LABBENZ NONE DETECTED 11/03/2018 1207   AMPHETMU NONE DETECTED 11/03/2018 1207   THCU NONE DETECTED 11/03/2018 1207   LABBARB NONE DETECTED 11/03/2018 1207    Alcohol Level No results found for: ETH  IMAGING  Ct Head Wo Contrast 11/03/2018 IMPRESSION:  1. No acute intracranial findings. 2. Atrophy and white matter microvascular disease. 3. Chronic deep white matter infarction in the RIGHT external capsule.  Mr Brain Wo Contrast 11/03/2018 IMPRESSION:  1. Incomplete,  severely motion degraded examination.  2. Scattered small acute infarcts in both cerebral hemispheres and brainstem.  3. Chronic basal ganglia infarcts and moderately advanced cerebral atrophy.   Dg Chest Port 1 View 11/03/2018 IMPRESSION:  No acute cardiopulmonary process.   Dg Abd Portable 1v 11/03/2018 IMPRESSION:  Negative.    Vas US Carotid (at [place] Only) 11/04/2018 Summary:  Right Carotid: The extracranial vessels were near-normal with only minimal wall thickening or plaque.  Left Carotid: The extracranial vessels were near-normal with only minimal wall thickening or plaque.  Vertebrals:  Bilateral vertebral arteries demonstrate antegrade flow.  Subclavians: Normal flow hemodynamics were seen in bilateral subclavian arteries.       Transthoracic Echocardiogram  00/00/2020 Pending Recent Results (from the past 43800 hour(s))  ECHOCARDIOGRAM COMPLETE   Collection Time: 11/04/18  3:36 PM  Result Value   BP 192/68   Narrative     ECHOCARDIOGRAM REPORT       Patient Name:   Becky Stout Date of Exam: 11/04/2018 Medical Rec #:  [id number]     Height:       64.0 in  Accession #:    [id number]    Weight:       183.5 lb Date of Birth:  1955-11-14    BSA:          1.89 m Patient Age:    63 years      BP:           192/68 mmHg Patient Gender: F             HR:           68 bpm. Exam Location:  Inpatient  Procedure: 2D Echo  Indications:    stroke 434.91   History:        Patient has prior history of Echocardiogram examinations, most                 recent 12/30/2005. Arrythmias:WPW syndrome Risk Factors:Diabetes,                 Dyslipidemia and Hypertension.   Sonographer:    Johny Chess Referring Phys: [id number] [place]    1. Left ventricular ejection fraction, by visual estimation, is 60 to 65%. The left ventricle has normal function. Normal left ventricular size. There is mildly increased left ventricular hypertrophy.  2. Elevated  mean left atrial pressure.  3. Left ventricular diastolic Doppler parameters are consistent with impaired relaxation pattern of LV diastolic filling.  4. Global right ventricle has normal systolic function.The right ventricular size is normal. No increase in right ventricular wall thickness.  5. Left atrial size was normal.  6. Right atrial size was normal.  7. The mitral valve is normal in structure. No evidence of mitral valve regurgitation. No evidence of mitral stenosis.  8. The tricuspid valve is normal in structure. Tricuspid valve regurgitation was not visualized by color flow Doppler.  9. The aortic valve is normal in structure. Aortic valve regurgitation was not visualized by color flow Doppler. Structurally normal aortic valve, with no evidence of sclerosis or stenosis. 10. The pulmonic valve was normal in structure. Pulmonic valve regurgitation is mild by color flow Doppler. 11. The inferior vena cava is normal in size with greater than 50% respiratory variability, suggesting right atrial pressure of 3 mmHg.  FINDINGS  Left Ventricle: Left ventricular ejection fraction, by visual estimation, is 60 to 65%. The left ventricle has normal function. There is mildly increased left ventricular hypertrophy. Concentric left ventricular hypertrophy. Normal left ventricular  size. Spectral Doppler shows Left ventricular diastolic Doppler parameters are consistent with impaired relaxation pattern of LV diastolic filling. Elevated mean left atrial pressure.  Right Ventricle: The right ventricular size is normal. No increase in right ventricular wall thickness. Global RV systolic function is has normal systolic function.  Left Atrium: Left atrial size was normal in size.  Right Atrium: Right atrial size was normal in size  Pericardium: There is no evidence of pericardial effusion.  Mitral Valve: The mitral valve is normal in structure. No evidence of mitral valve stenosis by observation. No  evidence of mitral valve regurgitation.  Tricuspid Valve: The tricuspid valve is normal in structure. Tricuspid valve regurgitation was not visualized by color flow Doppler.  Aortic Valve: The aortic valve is normal in structure. Aortic valve regurgitation was not visualized by color flow Doppler. The aortic valve is structurally normal, with no evidence of sclerosis or stenosis.  Pulmonic Valve: The pulmonic valve was normal in structure. Pulmonic valve regurgitation is mild by color flow Doppler.  Aorta: The aortic root, ascending aorta  and aortic arch are all structurally normal, with no evidence of dilitation or obstruction.  Venous: The inferior vena cava is normal in size with greater than 50% respiratory variability, suggesting right atrial pressure of 3 mmHg.  IAS/Shunts: No atrial level shunt detected by color flow Doppler. No ventricular septal defect is seen or detected. There is no evidence of an atrial septal defect.     LEFT VENTRICLE PLAX 2D LVIDd:         4.20 cm  Diastology LVIDs:         2.60 cm  LV e' lateral:   5.98 cm/s LV PW:         1.30 cm  LV E/e' lateral: 14.4 LV IVS:        0.90 cm  LV e' medial:    5.11 cm/s LVOT diam:     2.10 cm  LV E/e' medial:  16.8 LV SV:         54 ml LV SV Index:   27.41 LVOT Area:     3.46 cm    RIGHT VENTRICLE RV S prime:     16.40 cm/s TAPSE (M-mode): 2.4 cm  LEFT ATRIUM             Index       RIGHT ATRIUM           Index LA diam:        4.10 cm 2.17 cm/m  RA Area:     11.40 cm LA Vol (A2C):   43.5 ml 23.06 ml/m RA Volume:   22.80 ml  12.09 ml/m LA Vol (A4C):   47.0 ml 24.92 ml/m LA Biplane Vol: 46.3 ml 24.55 ml/m  AORTIC VALVE LVOT Vmax:   138.00 cm/s LVOT Vmean:  80.600 cm/s LVOT VTI:    0.277 m   AORTA Ao Root diam: 3.20 cm  MITRAL VALVE MV Area (PHT): 3.65 cm              SHUNTS MV PHT:        60.32 msec            Systemic VTI:  0.28 m MV Decel Time: 208 msec              Systemic Diam: 2.10 cm MV E  velocity: 86.10 cm/s  103 cm/s MV A velocity: 113.00 cm/s 70.3 cm/s MV E/A ratio:  0.76        1.5    Ena Dawley MD Electronically signed by Ena Dawley MD Signature Date/Time: 11/04/2018/5:30:53 PM       Final     *Note: Due to a large number of results and/or encounters for the requested time period, some results have not been displayed. A complete set of results can be found in Results Review.     ECG - SR rate 95 BPM. (See cardiology reading for complete details)   PHYSICAL EXAM Blood pressure (!) 187/79, pulse 87, temperature 98.6 F (37 C), temperature source Oral, resp. rate 18, SpO2 100 %. Middle-aged Caucasian lady who appears not to be in distress. . Afebrile. Head is nontraumatic. Neck is supple without bruit.    Cardiac exam no murmur or gallop. Lungs are clear to auscultation. Distal pulses are well felt.  Neurological Exam :  She is awake alert disoriented to time place and person.  Speech is slurred.  She does follow simple commands.  Extraocular movements appear full range.  She blinks to threat bilaterally.  Left lower facial weakness.  Tongue midline.  Left hemiparesis 2/5 strength but able to withdraw against gravity.  Normal strength on the right.  Sensation was intact.  Plantars are downgoing.  Gait not tested.       ASSESSMENT/PLAN Ms. Kyran L Hoos is a 63 y.o. female with history of B12 deficiency, type 2 diabetes not on treatment, hx of TBI, CHF, hypertension, obesity, SVT and WPW syndrome, brought into [facility] emergency room for increasing confusion as well as failure to thrive and a fall. She did not receive IV t-PA due to unknown time of onset.   Stroke: Scattered small acute subcortical infarcts in both cerebral hemispheres and brainstem - multiple old infarcts -likely lacunar infarcts in setting of dehydration and diabetic ketoacidosis and UTI resultant dysarthria and left hemiparesis  Code Stroke CT Head - not ordered  CT head - No  acute intracranial findings. Atrophy and white matter microvascular disease. Chronic deep white matter infarction in the RIGHT external capsule.  MRI head -  Incomplete, severely motion degraded. Scattered small acute infarcts in both cerebral hemispheres and brainstem. Chronic basal ganglia infarcts and moderately advanced cerebral atrophy.   MRA head - not ordered  CTA H&N - not ordered  CT Perfusion - not ordered  Carotid Doppler - unremarkable  2D Echo -  Left ventricular ejection fraction, by visual estimation, is 60 to 65%. The left ventricle has normal function. Normal left ventricular size. There is mildly increased left ventricular hypertroSars Corona Virus 2 - negative  LDL - 163  HgbA1c - 7.9  UDS - negative  VTE prophylaxis - Lovenox Diet  Diet Order            Diet Carb Modified Fluid consistency: Thin; Room service appropriate? Yes  Diet effective now              aspirin 81 mg daily prior to admission, now on aspirin 81mg  daily and Plavix 75 mg daily for 3 weeks followed by Plavix alone  Patient counseled to be compliant with her antithrombotic medications  Ongoing aggressive stroke risk factor management  Therapy recommendations:  pending  Disposition:  Pending  Hypertension  Home BP meds:  none  Current BP meds:  none  Blood pressure somewhat high at times but within post stroke/TIA parameters . Permissive hypertension (OK if < 220/120) but gradually normalize in 5-7 days   . Long-term BP goal normotensive  Hyperlipidemia  Home Lipid lowering medication: none  LDL 163, goal < 70  Current lipid lowering medication: Lipitor 80 mg daily   Continue statin at discharge  Diabetes  Home diabetic meds:  Current diabetic meds: SSI  HgbA1c 7.9, goal < 7.0 Recent Labs    11/04/18 2127 11/05/18 0659 11/05/18 1137  GLUCAP 145* 147* 113*    Other Stroke Risk Factors  Advanced age  Obesity, 31.5 BMI., recommend weight loss, diet and  exercise as appropriate   Congestive Heart Failure  Multiple old infarcts by imaging  Other Active Problems  Mild Leukocytosis - 12.9->10.4 (afebrile)  Hypokalemia - 3.5->3.3->3.0 (will supplement with WPW hx)  UTI - Rocephin started 11/03/18  PLAN Aspirin and Plavix for 3 weeks followed by Plavix alone.  Aggressive risk factor modification.  Therapists recommend SNF rehabspeech therapy consults.  Hospital day # 2    Patient has presented with multiple by cerebral anterior and brainstem lacunar infarcts in the setting of dehydration, diabetic ketoacidosis and UTI.  Recommend aspirin and Plavix for 3 weeks followed by Plavix alone.  Transfer to  SNF for rehab when bed available.Discussed with Dr.Chiu and answered questions.Stroke team will sign off.  Kindly call for questions if any. Antony Contras, MD Medical DirectorDanford  [facility] Pager: [phone] 11/05/2018 1:37 PM   To contact Stroke Continuity provider, please refer to [email or web address] After hours, contact General Neurology

## 2018-11-05 NOTE — Evaluation (Signed)
Physical Therapy Evaluation Patient Details Name: Becky Stout MRN: [id number] DOB: 1955-09-03 Today's Date: 11/05/2018   History of Present Illness  63 y.o. female with Past medical history of B12 deficiency, type II DM not on therapy, HTN, IDA, obesity, WPW syndrome.  Clinical Impression  Pt demonstrates deficits in functional mobility, gait, balance, endurance, strength, power, and cognition. Pt experiences significant anxiety throughout session and appears to have a great fear of falling. Pt is self-limiting during session, and requires constant PT encouragement to participate. Pt requires physical assistance for all mobility performed at this time. Pt will benefit from skilled PT services to improve functional mobility and reduce falls risk.    Follow Up Recommendations SNF    Equipment Recommendations  Wheelchair (measurements PT);Wheelchair cushion (measurements PT);Hospital bed    Recommendations for Other Services       Precautions / Restrictions Precautions Precautions: Fall Restrictions Weight Bearing Restrictions: No      Mobility  Bed Mobility Overal bed mobility: Needs Assistance Bed Mobility: Supine to Sit;Sit to Supine     Supine to sit: Mod assist;+2 for physical assistance Sit to supine: Min assist      Transfers Overall transfer level: Needs assistance Equipment used: 2 person hand held assist Transfers: Sit to/from Stand Sit to Stand: Max assist;+2 physical assistance         General transfer comment: pt unable to come to full erect stance ~75% up to standing  Ambulation/Gait                Stairs            Wheelchair Mobility    Modified Rankin (Stroke Patients Only) Modified Rankin (Stroke Patients Only) Pre-Morbid Rankin Score: Moderate disability Modified Rankin: Severe disability     Balance Overall balance assessment: Needs assistance Sitting-balance support: Bilateral upper extremity supported;Feet  supported Sitting balance-Leahy Scale: Fair Sitting balance - Comments: minG for static sitting balance   Standing balance support: Bilateral upper extremity supported Standing balance-Leahy Scale: Zero Standing balance comment: max-A x 2                             Pertinent Vitals/Pain Pain Assessment: Faces Faces Pain Scale: Hurts a Scheuermann bit Pain Location: feet Pain Descriptors / Indicators: Sore Pain Intervention(s): Limited activity within patient's tolerance    Home Living Family/patient expects to be discharged to:: Private residence Living Arrangements: Alone Available Help at Discharge: Family;Available PRN/intermittently(sister once every few weeks) Type of Home: Apartment Home Access: Level entry     Home Layout: One level Home Equipment: Cane - single point      Prior Function Level of Independence: Independent with assistive device(s)         Comments: independent with cane household distances     Hand Dominance   Dominant Hand: Left    Extremity/Trunk Assessment   Upper Extremity Assessment Upper Extremity Assessment: Defer to OT evaluation    Lower Extremity Assessment Lower Extremity Assessment: Generalized weakness(grossly 4-/5)    Cervical / Trunk Assessment Cervical / Trunk Assessment: Normal  Communication   Communication: No difficulties  Cognition Arousal/Alertness: Awake/alert Behavior During Therapy: Anxious Overall Cognitive Status: No family/caregiver present to determine baseline cognitive functioning(appears significantly altered but unsure of baseline)                                 General Comments:  pt with short term memory deficits and perseveration about fear of falling      General Comments      Exercises     Assessment/Plan    PT Assessment Patient needs continued PT services  PT Problem List Decreased strength;Decreased activity tolerance;Decreased balance;Decreased  mobility;Decreased knowledge of use of DME;Decreased cognition;Decreased safety awareness       PT Treatment Interventions DME instruction;Gait training;Functional mobility training;Therapeutic activities;Therapeutic exercise;Balance training;Neuromuscular re-education;Patient/family education;Cognitive remediation    PT Goals (Current goals can be found in the Care Plan section)  Acute Rehab PT Goals Patient Stated Goal: To improve mobility PT Goal Formulation: With patient Time For Goal Achievement: 11/19/18 Potential to Achieve Goals: Fair    Frequency Min 3X/week   Barriers to discharge Decreased caregiver support      Co-evaluation               AM-PAC PT "6 Clicks" Mobility  Outcome Measure Help needed turning from your back to your side while in a flat bed without using bedrails?: A Lot Help needed moving from lying on your back to sitting on the side of a flat bed without using bedrails?: A Lot Help needed moving to and from a bed to a chair (including a wheelchair)?: Total Help needed standing up from a chair using your arms (e.g., wheelchair or bedside chair)?: Total Help needed to walk in hospital room?: Total Help needed climbing 3-5 steps with a railing? : Total 6 Click Score: 8    End of Session Equipment Utilized During Treatment: Gait belt Activity Tolerance: Other (comment)(limited 2/2 anxiety) Patient left: in bed;with call bell/phone within reach Nurse Communication: Mobility status PT Visit Diagnosis: Other abnormalities of gait and mobility (R26.89)    Time: [id number] PT Time Calculation (min) (ACUTE ONLY): 34 min   Charges:   PT Evaluation $PT Eval Moderate Complexity: 1 Mod          Zenaida Niece, PT, DPT Acute Rehabilitation Pager: [phone]   Zenaida Niece 11/05/2018, 10:26 AM

## 2018-11-06 DIAGNOSIS — R627 Adult failure to thrive: Secondary | ICD-10-CM

## 2018-11-06 DIAGNOSIS — F419 Anxiety disorder, unspecified: Secondary | ICD-10-CM

## 2018-11-06 LAB — CBC
HCT: 35.8 % — ABNORMAL LOW (ref 36.0–46.0)
Hemoglobin: 12.6 g/dL (ref 12.0–15.0)
MCH: 30.7 pg (ref 26.0–34.0)
MCHC: 35.2 g/dL (ref 30.0–36.0)
MCV: 87.3 fL (ref 80.0–100.0)
Platelets: 256 10*3/uL (ref 150–400)
RBC: 4.1 MIL/uL (ref 3.87–5.11)
RDW: 12.1 % (ref 11.5–15.5)
WBC: 6.7 10*3/uL (ref 4.0–10.5)
nRBC: 0 % (ref 0.0–0.2)

## 2018-11-06 LAB — GLUCOSE, CAPILLARY
Glucose-Capillary: 133 mg/dL — ABNORMAL HIGH (ref 70–99)
Glucose-Capillary: 233 mg/dL — ABNORMAL HIGH (ref 70–99)
Glucose-Capillary: 169 mg/dL — ABNORMAL HIGH (ref 70–99)

## 2018-11-06 LAB — BASIC METABOLIC PANEL
Anion gap: 13 (ref 5–15)
BUN: 6 mg/dL (ref 6–20)
CO2: 20 mmol/L — ABNORMAL LOW (ref 22–32)
Calcium: 8.3 mg/dL — ABNORMAL LOW (ref 8.9–10.3)
Chloride: 102 mmol/L (ref 98–111)
Creatinine, Ser: 0.61 mg/dL (ref 0.44–1.00)
GFR calc Af Amer: >60 mL/min (ref >60)
GFR calc non Af Amer: >60 mL/min (ref >60)
Glucose, Bld: 147 mg/dL — ABNORMAL HIGH (ref 70–99)
Potassium: 3 mmol/L — ABNORMAL LOW (ref 3.5–5.1)
Sodium: 135 mmol/L (ref 135–145)

## 2018-11-06 LAB — MAGNESIUM: Magnesium: 1.8 mg/dL (ref 1.7–2.4)

## 2018-11-06 MED ORDER — POTASSIUM CHLORIDE CRYS ER 20 MEQ PO TBCR
40.0000 meq | EXTENDED_RELEASE_TABLET | Freq: Two times a day (BID) | ORAL | Status: AC
  Administered 2018-11-06 (×2): 40 meq via ORAL
  Filled 2018-11-06 (×2): qty 2

## 2018-11-06 MED ORDER — VITAMIN B-1 100 MG PO TABS
100.0000 mg | ORAL_TABLET | Freq: Every day | ORAL | Status: DC
  Administered 2018-11-10 – 2019-02-02 (×85): 100 mg via ORAL
  Filled 2018-11-06 (×85): qty 1

## 2018-11-06 MED ORDER — MAGNESIUM SULFATE 2 GM/50ML IV SOLN
2.0000 g | Freq: Once | INTRAVENOUS | Status: AC
  Administered 2018-11-06: 2 g via INTRAVENOUS
  Filled 2018-11-06: qty 50

## 2018-11-06 MED ORDER — LISINOPRIL 5 MG PO TABS
5.0000 mg | ORAL_TABLET | Freq: Every day | ORAL | Status: DC
  Administered 2018-11-06 – 2018-11-08 (×3): 5 mg via ORAL
  Filled 2018-11-06 (×3): qty 1

## 2018-11-06 MED ORDER — THIAMINE HCL 100 MG/ML IJ SOLN
250.0000 mg | INTRAVENOUS | Status: AC
  Administered 2018-11-07 – 2018-11-09 (×3): 250 mg via INTRAVENOUS
  Filled 2018-11-06 (×3): qty 2.5
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
## 2018-11-07 DIAGNOSIS — E039 Hypothyroidism, unspecified: Secondary | ICD-10-CM

## 2018-11-07 DIAGNOSIS — E1165 Type 2 diabetes mellitus with hyperglycemia: Secondary | ICD-10-CM

## 2018-11-07 DIAGNOSIS — I6330 Cerebral infarction due to thrombosis of unspecified cerebral artery: Secondary | ICD-10-CM

## 2018-11-07 DIAGNOSIS — I10 Essential (primary) hypertension: Secondary | ICD-10-CM

## 2018-11-07 LAB — MAGNESIUM: Magnesium: 1.9 mg/dL (ref 1.7–2.4)

## 2018-11-07 LAB — COMPREHENSIVE METABOLIC PANEL
ALT: 13 U/L (ref 0–44)
AST: 16 U/L (ref 15–41)
Albumin: 2.8 g/dL — ABNORMAL LOW (ref 3.5–5.0)
Alkaline Phosphatase: 49 U/L (ref 38–126)
Anion gap: 8 (ref 5–15)
BUN: 6 mg/dL — ABNORMAL LOW (ref 8–23)
CO2: 24 mmol/L (ref 22–32)
Calcium: 8.3 mg/dL — ABNORMAL LOW (ref 8.9–10.3)
Chloride: 106 mmol/L (ref 98–111)
Creatinine, Ser: 0.49 mg/dL (ref 0.44–1.00)
GFR calc Af Amer: >60 mL/min (ref >60)
GFR calc non Af Amer: >60 mL/min (ref >60)
Glucose, Bld: 148 mg/dL — ABNORMAL HIGH (ref 70–99)
Potassium: 3.2 mmol/L — ABNORMAL LOW (ref 3.5–5.1)
Sodium: 138 mmol/L (ref 135–145)
Total Bilirubin: 0.9 mg/dL (ref 0.3–1.2)
Total Protein: 5.3 g/dL — ABNORMAL LOW (ref 6.5–8.1)

## 2018-11-07 LAB — GLUCOSE, CAPILLARY
Glucose-Capillary: 91 mg/dL (ref 70–99)
Glucose-Capillary: 166 mg/dL — ABNORMAL HIGH (ref 70–99)
Glucose-Capillary: 142 mg/dL — ABNORMAL HIGH (ref 70–99)
Glucose-Capillary: 169 mg/dL — ABNORMAL HIGH (ref 70–99)
Glucose-Capillary: 145 mg/dL — ABNORMAL HIGH (ref 70–99)
Glucose-Capillary: 148 mg/dL — ABNORMAL HIGH (ref 70–99)

## 2018-11-07 LAB — CBC
HCT: 34.7 % — ABNORMAL LOW (ref 36.0–46.0)
Hemoglobin: 12.3 g/dL (ref 12.0–15.0)
MCH: 31.1 pg (ref 26.0–34.0)
MCHC: 35.4 g/dL (ref 30.0–36.0)
MCV: 87.8 fL (ref 80.0–100.0)
Platelets: 265 10*3/uL (ref 150–400)
RBC: 3.95 MIL/uL (ref 3.87–5.11)
RDW: 12.3 % (ref 11.5–15.5)
WBC: 6.3 10*3/uL (ref 4.0–10.5)
nRBC: 0 % (ref 0.0–0.2)

## 2018-11-07 MED ORDER — POTASSIUM CHLORIDE CRYS ER 20 MEQ PO TBCR
40.0000 meq | EXTENDED_RELEASE_TABLET | ORAL | Status: AC
  Administered 2018-11-07 (×2): 40 meq via ORAL
  Filled 2018-11-07: qty 2

## 2018-11-07 MED ORDER — ASPIRIN EC 81 MG PO TBEC
81.0000 mg | DELAYED_RELEASE_TABLET | Freq: Every day | ORAL | Status: DC
  Administered 2018-11-07 – 2018-11-28 (×22): 81 mg via ORAL
  Filled 2018-11-07 (×22): qty 1

## 2018-11-07 MED ORDER — SERTRALINE HCL 50 MG PO TABS
25.0000 mg | ORAL_TABLET | Freq: Every day | ORAL | Status: DC
  Administered 2018-11-07 – 2018-11-14 (×8): 25 mg via ORAL
  Filled 2018-11-07 (×8): qty 1

## 2018-11-07 MED ORDER — SODIUM CHLORIDE 0.9% FLUSH: 10.0000 mL | INTRAVENOUS | Status: DC | PRN

## 2018-11-07 NOTE — Progress Notes (Addendum)
PROGRESS NOTE  Becky Stout [id number] DOB: December 09, 1955   PCP: Abner Greenspan, MD  Patient is from: Home  DOA: 11/03/2018 LOS: 4  Brief Narrative / Interim history: 63 year old female with history of DM-2, HTN, IDA, obesity and WPW syndrome presenting with increased confusion, fall and failure to thrive.  At baseline, patient lives alone.  Family brings food to her house on regular basis. Admitted for DKA, acute CVA, metabolic encephalopathy and unwitnessed fall.  Subjective: No major events overnight of this morning.  Has no complaints other than difficulty moving her legs.  Sister at bedside also reports some tingling in her legs.  Denies chest pain, dyspnea, GI or GU symptoms.  Fairly oriented but not a great historian.  Objective: Vitals:   11/06/18 1533 11/06/18 1958 11/07/18 0808 11/07/18 1218  BP: (!) 160/71 (!) 167/66    Pulse:  79    Resp:  13    Temp:  98.1 F (36.7 C) 98.7 F (37.1 C) 98.7 F (37.1 C)  TempSrc:  Oral Oral Oral  SpO2:  100%     No intake or output data in the 24 hours ending 11/07/18 1437 There were no vitals filed for this visit.  Examination:  GENERAL: No acute distress.  Appears well.  HEENT: MMM.  Vision and hearing grossly intact.  NECK: Supple.  No apparent JVD.  RESP:  No IWOB. Good air movement bilaterally. CVS:  RRR. Heart sounds normal.  ABD/GI/GU: Bowel sounds present. Soft. Non tender.  MSK/EXT:  Moves extremities. No apparent deformity or edema.  SKIN: no apparent skin lesion or wound NEURO: Awake, alert and oriented fairly.  Cranial nerves grossly intact.  Motor 2/5 in both lower extremities.  3+/5 in LUE.  Patellar reflex symmetric. PSYCH: Calm. Normal affect.   Assessment & Plan: Unwitnessed fall at home Acute CVA: MRI brain on 10/10 with multiple cerebral and brainstem lacunar infarcts.  Significant weakness in both legs and LUE. -Neurology recommended ASA 81 mg and Plavix for 3 weeks followed by Plavix alone.  -Continue statin. -No need for TEE or loop recorder per neurology. -Normalize BP over 5 to 7 days -PT/OT-SNF  Acute metabolic encephalopathy: Awake and alert.  Fairly oriented but not a great historian.  Might have some baseline cognitive impairment. -Frequent orientation delirium precautions -Treat treatable causes.  Hypertensive emergency: BP slightly elevated. -We will start normalizing starting tomorrow. -Continue low-dose lisinopril-started 10/13.  DKA/poorly controlled diabetes with hyperglycemia: DKA resolved.  A1c 7.9%.  Not on medication at home. -Continue SSI and CBG monitoring. -We will start low-dose Metformin on discharge.  Hypothyroidism: TSH within normal -Continue Synthroid.  Pyuria: No UTI symptoms per sister at bedside.  Urine cultures negative x3. -Completed 3 days of Rocephin.  Intractable nausea/vomiting: Resolved. -As needed antiemetics  History of WPW syndrome: Normal EKG here. -Avoid nodal blocking agents  GAD/agoraphobia -Appreciate psych input-Zoloft 25 mg daily for 1 week-10/14> -Increase to 50 mg daily   Hypokalemia: Likely due to IV fluid. -Replenish and recheck  Failure to thrive/poor p.o. intake:  -Appreciate dietitian input-see below -Supplements, multivitamin  Malnutrition Type/Characteristics and interventions:  Nutrition Problem: Inadequate oral intake Etiology: poor appetite  Signs/Symptoms: per patient/family report  Interventions: Prostat, Boost Breeze   DVT prophylaxis: Subcu Lovenox Code Status: DNR/DNI Family Communication: Patient and/or Therapist, sports. Available if any question. Disposition Plan: Remains inpatient.  Final disposition SNF.  Will order repeat Covid swab. Consultants: Neurology (signed off)  Procedures:  None  Microbiology summarized: 10/10-COVID-19 negative. 10/10-blood cultures negative.  10/10-urine culture multiple species. 10/11-urine culture negative.  Sch Meds:  Scheduled Meds: .  stroke: mapping our  early stages of recovery book   Does not apply Once  . aspirin  300 mg Rectal Daily   Or  . aspirin  325 mg Oral Daily  . atorvastatin  80 mg Oral q1800  . clopidogrel  75 mg Oral Daily  . enoxaparin (LOVENOX) injection  40 mg Subcutaneous Q24H  . feeding supplement  1 Container Oral TID BM  . feeding supplement (PRO-STAT SUGAR FREE 64)  30 mL Oral TID  . insulin aspart  0-15 Units Subcutaneous TID WC  . insulin aspart  0-5 Units Subcutaneous QHS  . lisinopril  5 mg Oral Daily  . potassium chloride  40 mEq Oral Q4H  . [START ON 11/10/2018] thiamine  100 mg Oral Daily   Continuous Infusions: . lactated ringers 125 mL/hr at 11/07/18 0551  . thiamine injection 250 mg (11/07/18 0855)   PRN Meds:.acetaminophen, labetalol, LORazepam, ondansetron (ZOFRAN) IV, sodium chloride flush  Antimicrobials: Anti-infectives (From admission, onward)   Start     Dose/Rate Route Frequency Ordered Stop   11/03/18 1930  cefTRIAXone (ROCEPHIN) 1 g in sodium chloride 0.9 % 100 mL IVPB  Status:  Discontinued     1 g 200 mL/hr over 30 Minutes Intravenous Every 24 hours 11/03/18 1724 11/06/18 1118   11/03/18 0530  cefTRIAXone (ROCEPHIN) 1 g in sodium chloride 0.9 % 100 mL IVPB     1 g 200 mL/hr over 30 Minutes Intravenous  Once 11/03/18 0525 11/03/18 0629       I have personally reviewed the following labs and images: CBC: Recent Labs  Lab 11/03/18 0359 11/04/18 0337 11/06/18 0320 11/07/18 0325  WBC 12.9* 10.4 6.7 6.3  NEUTROABS 11.2* 7.8*  --   --   HGB 14.6 13.5 12.6 12.3  HCT 43.5 39.0 35.8* 34.7*  MCV 91.2 90.3 87.3 87.8  PLT 343 287 256 265   BMP &GFR Recent Labs  Lab 11/03/18 0912 11/03/18 1041 11/03/18 1530 11/04/18 0337 11/05/18 0419 11/06/18 0320 11/07/18 0325  NA 138 140 138  --  135 135 138  K 3.5 3.3* 3.0*  --  3.4* 3.0* 3.2*  CL 104 106 108  --  104 102 106  CO2 17* 18* 18*  --  16* 20* 24  GLUCOSE 179* 174* 67*  --  155* 147* 148*  BUN 10 9 8   --  5* 6 6*   CREATININE 0.64 0.68 0.62  --  0.64 0.61 0.49  CALCIUM 9.2 9.2 8.8*  --  8.8* 8.3* 8.3*  MG 1.9  --   --  1.8 1.6* 1.8 1.9   CrCl cannot be calculated (Unknown ideal weight.). Liver & Pancreas: Recent Labs  Lab 11/03/18 0359 11/07/18 0325  AST 18 16  ALT 21 13  ALKPHOS 62 49  BILITOT 1.5* 0.9  PROT 7.7 5.3*  ALBUMIN 4.5 2.8*   Recent Labs  Lab 11/03/18 0359  LIPASE 18   Recent Labs  Lab 11/03/18 1856  AMMONIA 41*   Diabetic: No results for input(s): HGBA1C in the last 72 hours. Recent Labs  Lab 11/06/18 1205 11/06/18 1653 11/06/18 2108 11/07/18 0609 11/07/18 1220  GLUCAP 233* 169* 166* 142* 169*   Cardiac Enzymes: Recent Labs  Lab 11/03/18 0359  CKTOTAL 96   No results for input(s): PROBNP in the last 8760 hours. Coagulation Profile: No results for input(s): INR, PROTIME in the last 168 hours.  Thyroid Function Tests: No results for input(s): TSH, T4TOTAL, FREET4, T3FREE, THYROIDAB in the last 72 hours. Lipid Profile: No results for input(s): CHOL, HDL, LDLCALC, TRIG, CHOLHDL, LDLDIRECT in the last 72 hours. Anemia Panel: No results for input(s): VITAMINB12, FOLATE, FERRITIN, TIBC, IRON, RETICCTPCT in the last 72 hours. Urine analysis:    Component Value Date/Time   COLORURINE YELLOW 11/03/2018 0341   APPEARANCEUR CLOUDY (A) 11/03/2018 0341   LABSPEC 1.024 11/03/2018 0341   PHURINE 6.0 11/03/2018 0341   GLUCOSEU >=500 (A) 11/03/2018 0341   HGBUR MODERATE (A) 11/03/2018 0341   HGBUR negative 11/01/2008 1121   BILIRUBINUR NEGATIVE 11/03/2018 0341   BILIRUBINUR neg. 12/21/2012 0933   KETONESUR 80 (A) 11/03/2018 0341   PROTEINUR 30 (A) 11/03/2018 0341   UROBILINOGEN 0.2 12/21/2012 0933   UROBILINOGEN 0.2 11/01/2008 1121   NITRITE NEGATIVE 11/03/2018 0341   LEUKOCYTESUR MODERATE (A) 11/03/2018 0341   Sepsis Labs: Invalid input(s): PROCALCITONIN, [place]  Microbiology: Recent Results (from the past 240 hour(s))  Urine culture     Status:  Abnormal   Collection Time: 11/03/18  3:41 AM   Specimen: Urine, Clean Catch  Result Value Ref Range Status   Specimen Description   Final    URINE, CLEAN CATCH Performed at [facility], [place] [address] [place], [place] [address]    Special Requests   Final    NONE Performed at [facility], [place] [address], [place], [place] [address]    Culture MULTIPLE SPECIES PRESENT, SUGGEST RECOLLECTION (A)  Final   Report Status 11/04/2018 FINAL  Final  SARS CORONAVIRUS 2 (TAT 6-24 HRS) Nasopharyngeal Nasopharyngeal Swab     Status: None   Collection Time: 11/03/18  5:07 AM   Specimen: Nasopharyngeal Swab  Result Value Ref Range Status   SARS Coronavirus 2 NEGATIVE NEGATIVE Final    Comment: (NOTE) SARS-CoV-2 target nucleic acids are NOT DETECTED. The SARS-CoV-2 RNA is generally detectable in upper and lower respiratory specimens during the acute phase of infection. Negative results do not preclude SARS-CoV-2 infection, do not rule out co-infections with other pathogens, and should not be used as the sole basis for treatment or other patient management decisions. Negative results must be combined with clinical observations, patient history, and epidemiological information. The expected result is Negative. Fact Sheet for Patients: SugarRoll.be Fact Sheet for Healthcare Providers: https://www.woods-mathews.com/ This test is not yet approved or cleared by the [place] FDA and  has been authorized for detection and/or diagnosis of SARS-CoV-2 by FDA under an Emergency Use Authorization (EUA). This EUA will remain  in effect (meaning this test can be used) for the duration of the COVID-19 declaration under Section 56 4(b)(1) of the Act, 21 U.S.C. section 360bbb-3(b)(1), unless the authorization is terminated or revoked sooner. Performed at [facility], [place] [address], [place], [place] [address]    Culture, blood (routine x 2)     Status: None (Preliminary result)   Collection Time: 11/03/18  1:12 PM   Specimen: BLOOD  Result Value Ref Range Status   Specimen Description   Final    BLOOD LEFT ANTECUBITAL Performed at [place] [address], [place], [place] [address]    Special Requests   Final    BOTTLES DRAWN AEROBIC AND ANAEROBIC Blood Culture adequate volume Performed at [place] [address] [place], [place] [address]    Culture   Final    NO GROWTH 2 DAYS Performed at [facility], [place] [address] [place], [place] [address]    Report Status PENDING  Incomplete  Culture, Urine     Status: None   Collection Time: 11/04/18  7:49 PM   Specimen: Urine, Clean Catch  Result Value Ref Range Status   Specimen Description URINE, CLEAN CATCH  Final   Special Requests NONE  Final   Culture   Final    NO GROWTH Performed at [facility], [place] [address] [place], [place] [address]    Report Status 11/05/2018 FINAL  Final    Radiology Studies: No results found.  35 minutes with more than 50% spent in reviewing records, counseling patient and coordinating care.  Taye T. [place]  If 7PM-7AM, please contact night-coverage [email or web address] Password [facility] 11/07/2018, 2:37 PM

## 2018-11-07 NOTE — TOC Progression Note (Signed)
Transition of Care [facility]) - Progression Note    Patient Details  Name: Becky Stout MRN: [id number] Date of Birth: December 09, 1955  Transition of [facility]) CM/SW Contact  Pollie Friar, RN Phone Number: 11/07/2018, 2:24 PM  Clinical Narrative:    CM met with the patient and her sister. Sister concerned about getting medicaid and disability. CM sent financial counseling an email to please reach out to sister.  Pt will need SNF at d/c d/c lack of family availability.  ToC following.  Expected Discharge Plan: [place] Barriers to Discharge: Continued Medical Work up, Inadequate or no insurance  Expected Discharge Plan and Services Expected Discharge Plan: [place] In-house Referral: Clinical Social Work Discharge Planning Services: CM Consult Post Acute Care Choice: [place] arrangements for the past 2 months: Single Family Home                                       Social Determinants of Health (SDOH) Interventions    Readmission Risk Interventions No flowsheet data found.

## 2018-11-07 NOTE — Progress Notes (Signed)
Physical Therapy Treatment Patient Details Name: Becky Stout MRN: [id number] DOB: October 08, 1955 Today's Date: 11/07/2018    History of Present Illness 63 y.o. female with Past medical history of B12 deficiency, type II DM not on therapy, HTN, IDA, obesity, WPW syndrome.    PT Comments    Pt remains limited by anxiety during session, having difficulty initiating mobility and requiring constant PT verbal cues and reassurance. Pt with great fear of falling, often reporting that she is falling while sitting at edge of bed and well supported. Pt requires maxA for all OOB activity, although demonstrating improved LE strength during functional activity compared to evaluation. Pt will benefit from moderate intensity inpatient PT at discharge.   Follow Up Recommendations  SNF     Equipment Recommendations  Wheelchair (measurements PT);Wheelchair cushion (measurements PT);Hospital bed    Recommendations for Other Services       Precautions / Restrictions Precautions Precautions: Fall Restrictions Weight Bearing Restrictions: No    Mobility  Bed Mobility Overal bed mobility: Needs Assistance Bed Mobility: Supine to Sit;Sit to Supine;Rolling Rolling: Min assist   Supine to sit: Mod assist Sit to supine: Mod assist      Transfers Overall transfer level: Needs assistance Equipment used: 1 person hand held assist Transfers: Stand Pivot Transfers   Stand pivot transfers: Max assist       General transfer comment: pt performs stand pivot transfer to arm chair and then back to bed  Ambulation/Gait                 Stairs             Wheelchair Mobility    Modified Rankin (Stroke Patients Only) Modified Rankin (Stroke Patients Only) Pre-Morbid Rankin Score: Moderate disability Modified Rankin: Severe disability     Balance Overall balance assessment: Needs assistance Sitting-balance support: Bilateral upper extremity supported;Feet supported Sitting  balance-Leahy Scale: Fair Sitting balance - Comments: minG for static sitting balance   Standing balance support: Bilateral upper extremity supported Standing balance-Leahy Scale: Zero Standing balance comment: maxA of 1                            Cognition Arousal/Alertness: Awake/alert Behavior During Therapy: Anxious Overall Cognitive Status: Impaired/Different from baseline Area of Impairment: Attention;Memory;Following commands;Safety/judgement;Awareness;Problem solving                   Current Attention Level: Alternating Memory: Decreased recall of precautions;Decreased short-term memory Following Commands: Follows one step commands inconsistently;Follows one step commands with increased time Safety/Judgement: Decreased awareness of safety;Decreased awareness of deficits   Problem Solving: Slow processing;Decreased initiation;Requires verbal cues        Exercises      General Comments        Pertinent Vitals/Pain Pain Assessment: No/denies pain    Home Living                      Prior Function            PT Goals (current goals can now be found in the care plan section) Acute Rehab PT Goals Patient Stated Goal: pt did not state Progress towards PT goals: Progressing toward goals    Frequency    Min 3X/week      PT Plan Current plan remains appropriate    Co-evaluation              AM-PAC PT "6 Clicks" Mobility  Outcome Measure  Help needed turning from your back to your side while in a flat bed without using bedrails?: A Gladish Help needed moving from lying on your back to sitting on the side of a flat bed without using bedrails?: A Lot Help needed moving to and from a bed to a chair (including a wheelchair)?: Total Help needed standing up from a chair using your arms (e.g., wheelchair or bedside chair)?: Total Help needed to walk in hospital room?: Total Help needed climbing 3-5 steps with a railing? :  Total 6 Click Score: 9    End of Session Equipment Utilized During Treatment: Gait belt Activity Tolerance: (limited 2/2 anxiety) Patient left: in bed;with call bell/phone within reach;with bed alarm set;with family/visitor present Nurse Communication: Mobility status PT Visit Diagnosis: Other abnormalities of gait and mobility (R26.89)     Time: [id number] PT Time Calculation (min) (ACUTE ONLY): 58 min  Charges:  $Therapeutic Activity: 53-67 mins                     Zenaida Niece, PT, DPT Acute Rehabilitation Pager: [phone]    Zenaida Niece 11/07/2018, 2:22 PM

## 2018-11-08 LAB — BASIC METABOLIC PANEL
Anion gap: 11 (ref 5–15)
BUN: 8 mg/dL (ref 8–23)
CO2: 22 mmol/L (ref 22–32)
Calcium: 8.7 mg/dL — ABNORMAL LOW (ref 8.9–10.3)
Chloride: 101 mmol/L (ref 98–111)
Creatinine, Ser: 0.45 mg/dL (ref 0.44–1.00)
GFR calc Af Amer: >60 mL/min (ref >60)
GFR calc non Af Amer: >60 mL/min (ref >60)
Glucose, Bld: 156 mg/dL — ABNORMAL HIGH (ref 70–99)
Potassium: 3.7 mmol/L (ref 3.5–5.1)
Sodium: 134 mmol/L — ABNORMAL LOW (ref 135–145)

## 2018-11-08 LAB — MAGNESIUM: Magnesium: 1.8 mg/dL (ref 1.7–2.4)

## 2018-11-08 LAB — GLUCOSE, CAPILLARY
Glucose-Capillary: 149 mg/dL — ABNORMAL HIGH (ref 70–99)
Glucose-Capillary: 221 mg/dL — ABNORMAL HIGH (ref 70–99)
Glucose-Capillary: 153 mg/dL — ABNORMAL HIGH (ref 70–99)
Glucose-Capillary: 136 mg/dL — ABNORMAL HIGH (ref 70–99)

## 2018-11-08 LAB — TROPONIN I (HIGH SENSITIVITY)
Troponin I (High Sensitivity): 6 ng/L (ref <18)
Troponin I (High Sensitivity): 5 ng/L (ref <18)

## 2018-11-08 MED ORDER — PANTOPRAZOLE SODIUM 40 MG PO TBEC
40.0000 mg | DELAYED_RELEASE_TABLET | Freq: Every day | ORAL | Status: DC
  Administered 2018-11-08 – 2019-02-02 (×87): 40 mg via ORAL
  Filled 2018-11-08 (×88): qty 1

## 2018-11-08 MED ORDER — AMLODIPINE BESYLATE 10 MG PO TABS
10.0000 mg | ORAL_TABLET | Freq: Every day | ORAL | Status: DC
  Administered 2018-11-08 – 2019-01-19 (×72): 10 mg via ORAL
  Filled 2018-11-08 (×4): qty 1
  Filled 2018-11-08: qty 2
  Filled 2018-11-08 (×68): qty 1

## 2018-11-08 NOTE — Progress Notes (Signed)
PROGRESS NOTE  Becky Stout [id number] DOB: 1955-12-11   PCP: Abner Greenspan, MD  Patient is from: Home  DOA: 11/03/2018 LOS: 5  Brief Narrative / Interim history: 63 year old female with history of DM-2, HTN, IDA, obesity and WPW syndrome presenting with increased confusion, fall and failure to thrive.  At baseline, patient lives alone.  Family brings food to her house on regular basis. Admitted for DKA, acute CVA, metabolic encephalopathy and unwitnessed fall.  Subjective: No major events overnight of this morning.  No complaints other than some neuropathic pain in her feet.  Denies chest pain, dyspnea, nausea, vomiting or abdominal pain.  Denies UTI symptoms.  Fairly oriented but not a great historian.  Objective: Vitals:   11/07/18 2000 11/08/18 0007 11/08/18 0410 11/08/18 0848  BP: (!) 185/93 (!) 195/83 (!) 187/71 (!) 203/86  Pulse:  79 73 81  Resp: 18  20 16   Temp: 98.2 F (36.8 C) 98.9 F (37.2 C) (!) 97.4 F (36.3 C) 98.6 F (37 C)  TempSrc: Oral Oral Oral Oral  SpO2: 100% 98% 100% 99%    Intake/Output Summary (Last 24 hours) at 11/08/2018 1146 Last data filed at 11/08/2018 0716 Gross per 24 hour  Intake -  Output 1150 ml  Net -1150 ml   There were no vitals filed for this visit.  Examination:  GENERAL: No acute distress.  Appears well.  HEENT: MMM.  Vision and hearing grossly intact.  NECK: Supple.  No apparent JVD.  RESP:  No IWOB. Good air movement bilaterally. CVS:  RRR. Heart sounds normal.  ABD/GI/GU: Bowel sounds present. Soft. Non tender.  MSK/EXT:  Moves extremities. No apparent deformity or edema.  SKIN: no apparent skin lesion or wound NEURO: Awake, alert and oriented fairly.  Motor 4/5 in all extremities. PSYCH: Calm. Normal affect.  Off topic at times.  Assessment & Plan: Unwitnessed fall at home Acute CVA: MRI brain on 11/03/18 with multiple cerebral and brainstem lacunar infarcts.  Some improvement in extremity weakness today. -ASA  81 mg and Plavix for 3 weeks followed by Plavix alone per neuro. -Continue statin. -No need for TEE or loop recorder per neurology. -We will start normalizing BP-discontinue IV fluid and start amlodipine -PT/OT-SNF-patient has no insurance.  Acute metabolic encephalopathy: Awake and alert.  Fairly oriented but not a great historian.  Might have some baseline cognitive impairment. -Frequent orientation delirium precautions -Treat treatable causes. -High-dose thiamine for 3 days 10/14>>  Hypertensive emergency: BP elevated. -Discontinue IV fluid -Add amlodipine.  Continue lisinopril.  DKA/poorly controlled diabetes with hyperglycemia: DKA resolved.  A1c 7.9%.  Not on medication at home. -Continue SSI and CBG monitoring. -We will start low-dose Metformin on discharge.  Hypothyroidism: TSH within normal -Continue Synthroid.  Pyuria: No UTI symptoms per sister at bedside.  Urine cultures negative x3. -Completed 3 days of Rocephin.  Intractable nausea/vomiting: Resolved. -As needed antiemetics  History of WPW syndrome: Normal EKG here. -Avoid nodal blocking agents  GAD/agoraphobia -Appreciate psych input-Zoloft 25 mg daily for 1 week-10/14> then increase to 50 mg daily.  Hypokalemia: Likely due to IV fluid. -Replenish and recheck  Failure to thrive/poor p.o. intake:  -Appreciate dietitian input-see below -Supplements, multivitamin  Malnutrition Type/Characteristics and interventions:  Nutrition Problem: Inadequate oral intake Etiology: poor appetite  Signs/Symptoms: per patient/family report  Interventions: Prostat, Boost Breeze   DVT prophylaxis: Subcu Lovenox Code Status: DNR/DNI Family Communication: Updated patient's sister at bedside on 10/14.  No family member at bedside today. Disposition Plan: Remains inpatient.  Final disposition SNF. Consultants: Neurology (signed off)  Procedures:  None  Microbiology summarized: 10/10-COVID-19 negative. 10/10-blood  cultures negative. 10/10-urine culture multiple species. 10/11-urine culture negative.  Sch Meds:  Scheduled Meds: .  stroke: mapping our early stages of recovery book   Does not apply Once  . amLODipine  10 mg Oral Daily  . aspirin EC  81 mg Oral Daily  . atorvastatin  80 mg Oral q1800  . clopidogrel  75 mg Oral Daily  . enoxaparin (LOVENOX) injection  40 mg Subcutaneous Q24H  . feeding supplement  1 Container Oral TID BM  . feeding supplement (PRO-STAT SUGAR FREE 64)  30 mL Oral TID  . insulin aspart  0-15 Units Subcutaneous TID WC  . insulin aspart  0-5 Units Subcutaneous QHS  . lisinopril  5 mg Oral Daily  . sertraline  25 mg Oral Daily  . [START ON 11/10/2018] thiamine  100 mg Oral Daily   Continuous Infusions: . lactated ringers 75 mL/hr at 11/07/18 1844  . thiamine injection 250 mg (11/07/18 0855)   PRN Meds:.acetaminophen, labetalol, LORazepam, ondansetron (ZOFRAN) IV, sodium chloride flush  Antimicrobials: Anti-infectives (From admission, onward)   Start     Dose/Rate Route Frequency Ordered Stop   11/03/18 1930  cefTRIAXone (ROCEPHIN) 1 g in sodium chloride 0.9 % 100 mL IVPB  Status:  Discontinued     1 g 200 mL/hr over 30 Minutes Intravenous Every 24 hours 11/03/18 1724 11/06/18 1118   11/03/18 0530  cefTRIAXone (ROCEPHIN) 1 g in sodium chloride 0.9 % 100 mL IVPB     1 g 200 mL/hr over 30 Minutes Intravenous  Once 11/03/18 0525 11/03/18 0629       I have personally reviewed the following labs and images: CBC: Recent Labs  Lab 11/03/18 0359 11/04/18 0337 11/06/18 0320 11/07/18 0325  WBC 12.9* 10.4 6.7 6.3  NEUTROABS 11.2* 7.8*  --   --   HGB 14.6 13.5 12.6 12.3  HCT 43.5 39.0 35.8* 34.7*  MCV 91.2 90.3 87.3 87.8  PLT 343 287 256 265   BMP &GFR Recent Labs  Lab 11/03/18 1530 11/04/18 0337 11/05/18 0419 11/06/18 0320 11/07/18 0325 11/08/18 0323  NA 138  --  135 135 138 134*  K 3.0*  --  3.4* 3.0* 3.2* 3.7  CL 108  --  104 102 106 101  CO2 18*   --  16* 20* 24 22  GLUCOSE 67*  --  155* 147* 148* 156*  BUN 8  --  5* 6 6* 8  CREATININE 0.62  --  0.64 0.61 0.49 0.45  CALCIUM 8.8*  --  8.8* 8.3* 8.3* 8.7*  MG  --  1.8 1.6* 1.8 1.9 1.8   CrCl cannot be calculated (Unknown ideal weight.). Liver & Pancreas: Recent Labs  Lab 11/03/18 0359 11/07/18 0325  AST 18 16  ALT 21 13  ALKPHOS 62 49  BILITOT 1.5* 0.9  PROT 7.7 5.3*  ALBUMIN 4.5 2.8*   Recent Labs  Lab 11/03/18 0359  LIPASE 18   Recent Labs  Lab 11/03/18 1856  AMMONIA 41*   Diabetic: No results for input(s): HGBA1C in the last 72 hours. Recent Labs  Lab 11/07/18 0609 11/07/18 1220 11/07/18 1613 11/07/18 2203 11/08/18 0912  GLUCAP 142* 169* 145* 148* 149*   Cardiac Enzymes: Recent Labs  Lab 11/03/18 0359  CKTOTAL 96   No results for input(s): PROBNP in the last 8760 hours. Coagulation Profile: No results for input(s): INR, PROTIME in the  last 168 hours. Thyroid Function Tests: No results for input(s): TSH, T4TOTAL, FREET4, T3FREE, THYROIDAB in the last 72 hours. Lipid Profile: No results for input(s): CHOL, HDL, LDLCALC, TRIG, CHOLHDL, LDLDIRECT in the last 72 hours. Anemia Panel: No results for input(s): VITAMINB12, FOLATE, FERRITIN, TIBC, IRON, RETICCTPCT in the last 72 hours. Urine analysis:    Component Value Date/Time   COLORURINE YELLOW 11/03/2018 0341   APPEARANCEUR CLOUDY (A) 11/03/2018 0341   LABSPEC 1.024 11/03/2018 0341   PHURINE 6.0 11/03/2018 0341   GLUCOSEU >=500 (A) 11/03/2018 0341   HGBUR MODERATE (A) 11/03/2018 0341   HGBUR negative 11/01/2008 1121   BILIRUBINUR NEGATIVE 11/03/2018 0341   BILIRUBINUR neg. 12/21/2012 0933   KETONESUR 80 (A) 11/03/2018 0341   PROTEINUR 30 (A) 11/03/2018 0341   UROBILINOGEN 0.2 12/21/2012 0933   UROBILINOGEN 0.2 11/01/2008 1121   NITRITE NEGATIVE 11/03/2018 0341   LEUKOCYTESUR MODERATE (A) 11/03/2018 0341   Sepsis Labs: Invalid input(s): PROCALCITONIN, [place]  Microbiology: Recent  Results (from the past 240 hour(s))  Urine culture     Status: Abnormal   Collection Time: 11/03/18  3:41 AM   Specimen: Urine, Clean Catch  Result Value Ref Range Status   Specimen Description   Final    URINE, CLEAN CATCH Performed at [facility], [place] [address], [place], [place] [address]    Special Requests   Final    NONE Performed at [facility], [place] [address] [place], [place] [address]    Culture MULTIPLE SPECIES PRESENT, SUGGEST RECOLLECTION (A)  Final   Report Status 11/04/2018 FINAL  Final  SARS CORONAVIRUS 2 (TAT 6-24 HRS) Nasopharyngeal Nasopharyngeal Swab     Status: None   Collection Time: 11/03/18  5:07 AM   Specimen: Nasopharyngeal Swab  Result Value Ref Range Status   SARS Coronavirus 2 NEGATIVE NEGATIVE Final    Comment: (NOTE) SARS-CoV-2 target nucleic acids are NOT DETECTED. The SARS-CoV-2 RNA is generally detectable in upper and lower respiratory specimens during the acute phase of infection. Negative results do not preclude SARS-CoV-2 infection, do not rule out co-infections with other pathogens, and should not be used as the sole basis for treatment or other patient management decisions. Negative results must be combined with clinical observations, patient history, and epidemiological information. The expected result is Negative. Fact Sheet for Patients: SugarRoll.be Fact Sheet for Healthcare Providers: https://www.woods-mathews.com/ This test is not yet approved or cleared by the [place] FDA and  has been authorized for detection and/or diagnosis of SARS-CoV-2 by FDA under an Emergency Use Authorization (EUA). This EUA will remain  in effect (meaning this test can be used) for the duration of the COVID-19 declaration under Section 56 4(b)(1) of the Act, 21 U.S.C. section 360bbb-3(b)(1), unless the authorization is terminated or revoked sooner. Performed at [facility], [place] [address] [place], [place] [address]   Culture, blood (routine x 2)     Status: None (Preliminary result)   Collection Time: 11/03/18  1:12 PM   Specimen: BLOOD  Result Value Ref Range Status   Specimen Description   Final    BLOOD LEFT ANTECUBITAL Performed at [place] [address], [place], [place] [address]    Special Requests   Final    BOTTLES DRAWN AEROBIC AND ANAEROBIC Blood Culture adequate volume Performed at [place] [address] [place], [place] [address]    Culture   Final    NO GROWTH 4 DAYS  Performed at [facility], [place] [address], [place], [place] [address]    Report Status PENDING  Incomplete  Culture, Urine     Status: None   Collection Time: 11/04/18  7:49 PM   Specimen: Urine, Clean Catch  Result Value Ref Range Status   Specimen Description URINE, CLEAN CATCH  Final   Special Requests NONE  Final   Culture   Final    NO GROWTH Performed at [facility], [place] [address] [place], [place] [address]    Report Status 11/05/2018 FINAL  Final    Radiology Studies: No results found.  Kijuan Gallicchio T. [place]  If 7PM-7AM, please contact night-coverage [email or web address] Password [email or web address] 11/08/2018, 11:46 AM

## 2018-11-08 NOTE — NC FL2 (Signed)
MEDICAID FL2 LEVEL OF CARE SCREENING TOOL     IDENTIFICATION  Patient Name: Becky Stout Birthdate: 1955/02/02 Sex: female Admission Date (Current Location): 11/03/2018  [facility] and [place] Number:  Herbalist and Address:  [facility] [place], [place] [address] [place], [place] [address]      Provider Number: [id number]  Attending Physician Name and Address:  Mercy Riding, MD  Relative Name and Phone Number:       Current Level of Care: Hospital Recommended Level of Care: [place] Prior Approval Number:    Date Approved/Denied:   PASRR Number: Manual review  Discharge Plan: SNF    Current Diagnoses: Patient Active Problem List   Diagnosis Date Noted  . Cerebral thrombosis with cerebral infarction 11/04/2018  . Failure to thrive in adult 11/03/2018  . Acute lower UTI 11/03/2018  . High anion gap metabolic acidosis [id number]  . DKA (diabetic ketoacidoses) ([place]) 11/03/2018  . Anxiety 09/15/2015  . Obesity 05/22/2015  . Hyponatremia 01/10/2014  . Facial rash 06/18/2013  . Joint pain 06/18/2013  . Frequent urination 12/21/2012  . Colon cancer screening 04/27/2012  . Routine general medical examination at a health care facility 04/10/2011  . Other screening mammogram 04/08/2011  . Gluten intolerance 04/08/2011  . Hyperlipidemia 03/24/2010  . Poorly controlled type 2 diabetes mellitus ([place]) 03/24/2010  . MAMMOGRAM, ABNORMAL 03/24/2010  . Tachycardia 03/14/2008  . B12 deficiency 11/12/2007  . Hypothyroidism 11/06/2007  . Essential hypertension 10/26/2007  . WOLFF (WOLFE)-PARKINSON-WHITE (WPW) SYNDROME 04/30/2007  . SUPRAVENTRICULAR TACHYCARDIA 05/18/2006    Orientation RESPIRATION BLADDER Height & Weight     Self  Normal Incontinent Weight:   Height:     BEHAVIORAL SYMPTOMS/MOOD NEUROLOGICAL BOWEL NUTRITION STATUS      Incontinent Diet(Carb modified)  AMBULATORY STATUS COMMUNICATION OF NEEDS Skin    Extensive Assist Verbally Other (Comment)(MASD under both breasts)                       Personal Care Assistance Level of Assistance  Bathing, Feeding, Dressing Bathing Assistance: Maximum assistance Feeding assistance: Independent Dressing Assistance: Maximum assistance     Functional Limitations Info  Sight, Hearing, Speech Sight Info: Impaired Hearing Info: Adequate Speech Info: Adequate    SPECIAL CARE FACTORS FREQUENCY  PT (By licensed PT), OT (By licensed OT), Speech therapy     PT Frequency: 5x/wk OT Frequency: 5x/wk     Speech Therapy Frequency: 5x/wk      Contractures Contractures Info: Not present    Additional Factors Info  Code Status, Psychotropic, Insulin Sliding Scale, Allergies Code Status Info: DNR Allergies Info: shellfish/ gluten/ tetracycline/ hydrocod polst-cpm/ oseltamivir Psychotropic Info: Zoloft 25 mg po daily Insulin Sliding Scale Info: Novolog 0-15 units SQ three times a day/ Novolog 0-5 units SQ at bedtime       Current Medications (11/08/2018):  This is the current hospital active medication list Current Facility-Administered Medications  Medication Dose Route Frequency Provider Last Rate Last Dose  .  stroke: mapping our early stages of recovery book   Does not apply Once Lavina Hamman, MD      . acetaminophen (TYLENOL) tablet 650 mg  650 mg Oral Q6H PRN Lavina Hamman, MD   650 mg at 11/04/18 2122  . amLODipine (NORVASC) tablet 10 mg  10 mg Oral Daily Wendee Beavers T, MD   10 mg at 11/08/18 0918  . aspirin EC tablet 81 mg  81 mg Oral Daily Wendee Beavers T, MD   81 mg at 11/08/18 0919  . atorvastatin (LIPITOR) tablet 80 mg  80 mg Oral q1800 Lavina Hamman, MD   80 mg at 11/07/18 1841  . clopidogrel (PLAVIX) tablet 75 mg  75 mg Oral Daily Donne Hazel, MD   75 mg at 11/08/18 0919  . enoxaparin (LOVENOX) injection 40 mg  40 mg Subcutaneous Q24H Lavina Hamman, MD   40 mg at 11/07/18 2000  . feeding supplement (BOOST / RESOURCE  BREEZE) liquid 1 Container  1 Container Oral TID BM Donne Hazel, MD   1 Container at 11/08/18 0920  . feeding supplement (PRO-STAT SUGAR FREE 64) liquid 30 mL  30 mL Oral TID Donne Hazel, MD   30 mL at 11/08/18 0919  . insulin aspart (novoLOG) injection 0-15 Units  0-15 Units Subcutaneous TID WC Donne Hazel, MD   2 Units at 11/08/18 [phone]  . insulin aspart (novoLOG) injection 0-5 Units  0-5 Units Subcutaneous QHS Donne Hazel, MD      . labetalol (NORMODYNE) injection 5 mg  5 mg Intravenous Q2H PRN Donne Hazel, MD      . lactated ringers infusion   Intravenous Continuous Mercy Riding, MD 75 mL/hr at 11/07/18 1844    . lisinopril (ZESTRIL) tablet 5 mg  5 mg Oral Daily Donne Hazel, MD   5 mg at 11/08/18 [id number]  . LORazepam (ATIVAN) injection 0.5 mg  0.5 mg Intravenous Once PRN Lavina Hamman, MD      . ondansetron [facility]) injection 4 mg  4 mg Intravenous Q6H PRN Lavina Hamman, MD   4 mg at 11/04/18 2122  . sertraline (ZOLOFT) tablet 25 mg  25 mg Oral Daily Wendee Beavers T, MD   25 mg at 11/08/18 0918  . sodium chloride flush (NS) 0.9 % injection 10-40 mL  10-40 mL Intracatheter PRN Donne Hazel, MD      . thiamine (B-1) 250 mg in sodium chloride 0.9 % 50 mL IVPB  250 mg Intravenous Q24H Donne Hazel, MD 100 mL/hr at 11/07/18 0855 250 mg at 11/07/18 0855  . [START ON 11/10/2018] thiamine (VITAMIN B-1) tablet 100 mg  100 mg Oral Daily Donne Hazel, MD         Discharge Medications: Please see discharge summary for a list of discharge medications.  Relevant Imaging Results:  Relevant Lab Results:   Additional Information SS#: [id number] //   Will be a LOG from the hospital  Pollie Friar, RN
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
## 2018-11-08 NOTE — Progress Notes (Signed)
Physical Therapy Treatment Patient Details Name: Becky Stout MRN: [id number] DOB: 27-Jan-1955 Today's Date: 11/08/2018    History of Present Illness 63 y.o. female with Past medical history of B12 deficiency, type II DM not on therapy, HTN, IDA, obesity, WPW syndrome.    PT Comments    Pt remains limited by anxiety during session. Pt perseverates on fear of falling and talks in circles about the steps of mobility sequencing, building anxiety prior to attempting sit to stand. Pt demonstrates improved bed mobility quality, but continues to require maxA for transfers. Pt will continue to benefit from PT POC to reduce falls risk and caregiver burden.   Follow Up Recommendations  SNF     Equipment Recommendations  Wheelchair (measurements PT);Wheelchair cushion (measurements PT);Hospital bed    Recommendations for Other Services       Precautions / Restrictions Precautions Precautions: Fall Restrictions Weight Bearing Restrictions: No    Mobility  Bed Mobility Overal bed mobility: Needs Assistance Bed Mobility: Supine to Sit;Sit to Supine     Supine to sit: Min assist;HOB elevated Sit to supine: Min guard      Transfers Overall transfer level: Needs assistance Equipment used: 1 person hand held assist Transfers: Sit to/from Stand Sit to Stand: Max assist         General transfer comment: pt anxious during attempt, immediately asking to sit down after initiating standing  Ambulation/Gait                 Stairs             Wheelchair Mobility    Modified Rankin (Stroke Patients Only) Modified Rankin (Stroke Patients Only) Pre-Morbid Rankin Score: Moderate disability Modified Rankin: Severe disability     Balance Overall balance assessment: Needs assistance Sitting-balance support: Bilateral upper extremity supported;Feet supported Sitting balance-Leahy Scale: Fair Sitting balance - Comments: supervision of 1   Standing balance support:  Bilateral upper extremity supported Standing balance-Leahy Scale: Zero                              Cognition Arousal/Alertness: Awake/alert Behavior During Therapy: Anxious Overall Cognitive Status: Impaired/Different from baseline                                 General Comments: pt with impaired short term memory, awareness of deficits, orientation to situation      Exercises      General Comments        Pertinent Vitals/Pain Pain Assessment: 0-10 Pain Score: (unable to quantify) Faces Pain Scale: Hurts [place] more Pain Location: chest Pain Descriptors / Indicators: (pt reports all descriptors are positive when asked) Pain Intervention(s): Limited activity within patient's tolerance(RN meredith made aware)    Home Living                      Prior Function            PT Goals (current goals can now be found in the care plan section) Acute Rehab PT Goals Patient Stated Goal: pt did not state Progress towards PT goals: Progressing toward goals    Frequency    Min 3X/week      PT Plan Current plan remains appropriate    Co-evaluation              AM-PAC PT "6 Clicks" Mobility  Outcome Measure  Help needed turning from your back to your side while in a flat bed without using bedrails?: A [place] Help needed moving from lying on your back to sitting on the side of a flat bed without using bedrails?: A [place] Help needed moving to and from a bed to a chair (including a wheelchair)?: Total Help needed standing up from a chair using your arms (e.g., wheelchair or bedside chair)?: Total Help needed to walk in hospital room?: Total Help needed climbing 3-5 steps with a railing? : Total 6 Click Score: 10    End of Session Equipment Utilized During Treatment: Gait belt Activity Tolerance: Other (comment)(pt limited by anxiety) Patient left: in bed;with call bell/phone within reach;with bed alarm set;with family/visitor  present Nurse Communication: Mobility status PT Visit Diagnosis: Other abnormalities of gait and mobility (R26.89)     Time: 1441-1520 PT Time Calculation (min) (ACUTE ONLY): 39 min  Charges:  $Therapeutic Activity: 38-52 mins                     Zenaida Niece, PT, DPT Acute Rehabilitation Pager: [place] 11/08/2018, 3:42 PM

## 2018-11-08 NOTE — Significant Event (Signed)
Rapid Response Event Note  Overview: Chest Pain  Initial Focused Assessment: Received a call from primary nurse with concerns of patient having acute chest pain, nurse had obtained and EKG, and notified the MD. I went to see the patient, upon arrival, Ms. Barto denied having chest pain but stated that she has hiccups and that is what she is feeling. She denied chest pain, was breathing comfortably, was alert and conversant. Patient and her sister endorsed that the patient has had some acid reflux and perhaps that is causing the hiccups. SBP in the 160s, other VS stable, skin warm and dry. EKG reviewed, no significant change from previous ones and MD reviewed as well.   Interventions: -- No RRT Interventions   Plan of Care: -- MD called nurse back, no acute interventions, will obtain EKG later if needed. Troponin was ordered as well. -- Monitor VS and mental status as ordered  Event Summary:  Call Time [place]  Karely Hurtado R

## 2018-11-08 NOTE — TOC Progression Note (Signed)
Transition of Care [facility]) - Progression Note    Patient Details  Name: Becky Stout MRN: [id number] Date of Birth: March 17, 1955  Transition [facility] Care [facility]) CM/SW Contact  Pollie Friar, RN Phone Number: 11/08/2018, 10:49 AM  Clinical Narrative:    Pt faxed out to facilities that accept LOG from the hospital. Pt has no insurance and sister made aware yesterday that the patients option will be limited. She voiced understanding.  TOC following.   Expected Discharge Plan: [place] Barriers to Discharge: Continued Medical Work up, Inadequate or no insurance  Expected Discharge Plan and Services Expected Discharge Plan: [place] In-house Referral: Clinical Social Work Discharge Planning Services: CM Consult Post Acute Care Choice: [place] arrangements for the past 2 months: Single Family Home                                       Social Determinants of Health (SDOH) Interventions    Readmission Risk Interventions No flowsheet data found.

## 2018-11-09 DIAGNOSIS — E876 Hypokalemia: Secondary | ICD-10-CM

## 2018-11-09 DIAGNOSIS — G9341 Metabolic encephalopathy: Secondary | ICD-10-CM

## 2018-11-09 LAB — BASIC METABOLIC PANEL
Anion gap: 12 (ref 5–15)
BUN: 5 mg/dL — ABNORMAL LOW (ref 8–23)
CO2: 21 mmol/L — ABNORMAL LOW (ref 22–32)
Calcium: 8.6 mg/dL — ABNORMAL LOW (ref 8.9–10.3)
Chloride: 101 mmol/L (ref 98–111)
Creatinine, Ser: 0.56 mg/dL (ref 0.44–1.00)
GFR calc Af Amer: >60 mL/min (ref >60)
GFR calc non Af Amer: >60 mL/min (ref >60)
Glucose, Bld: 169 mg/dL — ABNORMAL HIGH (ref 70–99)
Potassium: 3.1 mmol/L — ABNORMAL LOW (ref 3.5–5.1)
Sodium: 134 mmol/L — ABNORMAL LOW (ref 135–145)

## 2018-11-09 LAB — CULTURE, BLOOD (ROUTINE X 2)
Culture: NO GROWTH
Special Requests: ADEQUATE

## 2018-11-09 LAB — GLUCOSE, CAPILLARY
Glucose-Capillary: 173 mg/dL — ABNORMAL HIGH (ref 70–99)
Glucose-Capillary: 169 mg/dL — ABNORMAL HIGH (ref 70–99)
Glucose-Capillary: 223 mg/dL — ABNORMAL HIGH (ref 70–99)
Glucose-Capillary: 141 mg/dL — ABNORMAL HIGH (ref 70–99)

## 2018-11-09 LAB — MAGNESIUM: Magnesium: 1.7 mg/dL (ref 1.7–2.4)

## 2018-11-09 MED ORDER — POTASSIUM CHLORIDE CRYS ER 20 MEQ PO TBCR
40.0000 meq | EXTENDED_RELEASE_TABLET | ORAL | Status: AC
  Administered 2018-11-09 (×2): 40 meq via ORAL
  Filled 2018-11-09 (×2): qty 2

## 2018-11-09 MED ORDER — SODIUM CHLORIDE 0.9% FLUSH
10.0000 mL | Freq: Two times a day (BID) | INTRAVENOUS | Status: DC
  Administered 2018-11-09: 20 mL
  Administered 2018-11-10 – 2018-11-20 (×17): 10 mL

## 2018-11-09 MED ORDER — LISINOPRIL 20 MG PO TABS
20.0000 mg | ORAL_TABLET | Freq: Every day | ORAL | Status: DC
  Administered 2018-11-09 – 2018-11-16 (×8): 20 mg via ORAL
  Filled 2018-11-09 (×9): qty 1

## 2018-11-09 MED ORDER — SODIUM CHLORIDE 0.9% FLUSH: 10.0000 mL | INTRAVENOUS | Status: DC | PRN

## 2018-11-09 MED ORDER — MAGNESIUM SULFATE 2 GM/50ML IV SOLN
2.0000 g | Freq: Once | INTRAVENOUS | Status: AC
  Administered 2018-11-09: 2 g via INTRAVENOUS
  Filled 2018-11-09: qty 50

## 2018-11-09 MED ORDER — LABETALOL HCL 5 MG/ML IV SOLN: 10.0000 mg | INTRAVENOUS | Status: DC | PRN

## 2018-11-09 NOTE — Progress Notes (Signed)
PROGRESS NOTE  Becky Stout [id number] DOB: 07/26/1955   PCP: Abner Greenspan, MD  Patient is from: Home  DOA: 11/03/2018 LOS: 6  Brief Narrative / Interim history: 63 year old female with history of DM-2, HTN, IDA, obesity and WPW syndrome presenting with increased confusion, fall and failure to thrive.  At baseline, patient lives alone.  Family brings food to her house on regular basis. Admitted for DKA, acute CVA, metabolic encephalopathy and unwitnessed fall.  Subjective: No major events overnight of this morning.  Has no complaint this morning but not reliable historian.  She is still confused but oriented to self, partial place, month, year and the president.  Denies chest pain, dyspnea, headache, GI or GU symptoms.  Objective: Vitals:   11/09/18 0016 11/09/18 0410 11/09/18 0745 11/09/18 1134  BP: (!) 195/85 (!) 180/87 (!) 165/85 (!) 198/68  Pulse: 91 96 91 87  Resp: 19  17 19   Temp: 98.4 F (36.9 C) 98.6 F (37 C) 97.8 F (36.6 C) 98.1 F (36.7 C)  TempSrc: Oral Oral Oral Oral  SpO2: 97% 100% 97% 98%    Intake/Output Summary (Last 24 hours) at 11/09/2018 1205 Last data filed at 11/09/2018 0700 Gross per 24 hour  Intake 3419.77 ml  Output 1000 ml  Net 2419.77 ml   There were no vitals filed for this visit.  Examination:  GENERAL: No acute distress.  Appears well.  HEENT: MMM.  Vision and hearing grossly intact.  NECK: Supple.  No apparent JVD.  RESP:  No IWOB. Good air movement bilaterally. CVS:  RRR. Heart sounds normal.  ABD/GI/GU: Bowel sounds present. Soft. Non tender.  MSK/EXT:  Moves extremities. No apparent deformity or edema.  SKIN: no apparent skin lesion or wound NEURO: Awake, alert and oriented fairly but confused.  Motor 4/5 in all extremities. PSYCH: Calm. Normal affect.  She goes off topic often.  Assessment & Plan: Unwitnessed fall at home Acute CVA: MRI brain on 11/03/18 with multiple cerebral and brainstem lacunar infarcts.  Some  improvement in extremity weakness today. -ASA 81 mg and Plavix for 3 weeks starting 10/12 followed by Plavix alone per neuro. -Continue statin. -No need for TEE or loop recorder per neurology. -We will start normalizing BP-discontinue IV fluid and start amlodipine -PT/OT-SNF-patient has no insurance.  Acute metabolic encephalopathy: Awake, alert and oriented fairly but confused.  Might have some baseline cognitive impairment.  No improvement with high-dose thiamine 10/14-10/16 -Frequent orientation delirium precautions -Treat treatable causes. -Continue thiamine 100 mg daily.  Hypertensive emergency: SBP ranged from 160s to 190s. -Added amlodipine 10 mg daily on 10/15 -Increase lisinopril to 20 mg daily -As needed labetalol for SBP greater than 180  DKA/poorly controlled diabetes with hyperglycemia: DKA resolved.  A1c 7.9%.  Not on medication at home. -Continue SSI and CBG monitoring. -We will start low-dose Metformin on discharge.  Hypothyroidism: TSH within normal -Continue Synthroid.  Pyuria: No UTI symptoms per sister at bedside.  Urine cultures negative x3. -Completed 3 days of Rocephin.  Intractable nausea/vomiting: Resolved. -As needed antiemetics  History of WPW syndrome: Normal EKG here. -Avoid nodal blocking agents  GAD/agoraphobia -Appreciate psych input-Zoloft 25 mg daily for 1 week-10/14> then increase to 50 mg daily.  Hypokalemia: Likely due to IV fluid. -Replenish and recheck  Failure to thrive/poor p.o. intake:  -Appreciate dietitian input-see below -Supplements, multivitamin  Malnutrition Type/Characteristics and interventions: Nutrition Problem: Inadequate oral intake Etiology: poor appetite  Signs/Symptoms: per patient/family report  Interventions: Prostat, Boost [place]   DVT  prophylaxis: Subcu Lovenox Code Status: DNR/DNI Family Communication: Updated patient's sister at bedside on 10/14.  No family member at bedside today. Disposition Plan:  Remains inpatient.  Final disposition SNF. Consultants: Neurology (signed off)  Procedures:  None  Microbiology summarized: 10/10-COVID-19 negative. 10/10-blood cultures negative. 10/10-urine culture multiple species. 10/11-urine culture negative.  Sch Meds:  Scheduled Meds: .  stroke: mapping our early stages of recovery book   Does not apply Once  . amLODipine  10 mg Oral Daily  . aspirin EC  81 mg Oral Daily  . atorvastatin  80 mg Oral q1800  . clopidogrel  75 mg Oral Daily  . enoxaparin (LOVENOX) injection  40 mg Subcutaneous Q24H  . feeding supplement  1 Container Oral TID BM  . feeding supplement (PRO-STAT SUGAR FREE 64)  30 mL Oral TID  . insulin aspart  0-15 Units Subcutaneous TID WC  . insulin aspart  0-5 Units Subcutaneous QHS  . lisinopril  20 mg Oral Daily  . pantoprazole  40 mg Oral Daily  . sertraline  25 mg Oral Daily  . [START ON 11/10/2018] thiamine  100 mg Oral Daily   Continuous Infusions: . thiamine injection 250 mg (11/08/18 1305)   PRN Meds:.acetaminophen, labetalol, LORazepam, ondansetron (ZOFRAN) IV, sodium chloride flush  Antimicrobials: Anti-infectives (From admission, onward)   Start     Dose/Rate Route Frequency Ordered Stop   11/03/18 1930  cefTRIAXone (ROCEPHIN) 1 g in sodium chloride 0.9 % 100 mL IVPB  Status:  Discontinued     1 g 200 mL/hr over 30 Minutes Intravenous Every 24 hours 11/03/18 1724 11/06/18 1118   11/03/18 0530  cefTRIAXone (ROCEPHIN) 1 g in sodium chloride 0.9 % 100 mL IVPB     1 g 200 mL/hr over 30 Minutes Intravenous  Once 11/03/18 0525 11/03/18 0629       I have personally reviewed the following labs and images: CBC: Recent Labs  Lab 11/03/18 0359 11/04/18 0337 11/06/18 0320 11/07/18 0325  WBC 12.9* 10.4 6.7 6.3  NEUTROABS 11.2* 7.8*  --   --   HGB 14.6 13.5 12.6 12.3  HCT 43.5 39.0 35.8* 34.7*  MCV 91.2 90.3 87.3 87.8  PLT 343 287 256 265   BMP &GFR Recent Labs  Lab 11/05/18 0419 11/06/18 0320  11/07/18 0325 11/08/18 0323 11/09/18 0644  NA 135 135 138 134* 134*  K 3.4* 3.0* 3.2* 3.7 3.1*  CL 104 102 106 101 101  CO2 16* 20* 24 22 21*  GLUCOSE 155* 147* 148* 156* 169*  BUN 5* 6 6* 8 5*  CREATININE 0.64 0.61 0.49 0.45 0.56  CALCIUM 8.8* 8.3* 8.3* 8.7* 8.6*  MG 1.6* 1.8 1.9 1.8 1.7   CrCl cannot be calculated (Unknown ideal weight.). Liver & Pancreas: Recent Labs  Lab 11/03/18 0359 11/07/18 0325  AST 18 16  ALT 21 13  ALKPHOS 62 49  BILITOT 1.5* 0.9  PROT 7.7 5.3*  ALBUMIN 4.5 2.8*   Recent Labs  Lab 11/03/18 0359  LIPASE 18   Recent Labs  Lab 11/03/18 1856  AMMONIA 41*   Diabetic: No results for input(s): HGBA1C in the last 72 hours. Recent Labs  Lab 11/08/18 1225 11/08/18 1637 11/08/18 2106 11/09/18 0608 11/09/18 1136  GLUCAP 221* 153* 136* 173* 169*   Cardiac Enzymes: Recent Labs  Lab 11/03/18 0359  CKTOTAL 96   No results for input(s): PROBNP in the last 8760 hours. Coagulation Profile: No results for input(s): INR, PROTIME in the last 168 hours.  Thyroid Function Tests: No results for input(s): TSH, T4TOTAL, FREET4, T3FREE, THYROIDAB in the last 72 hours. Lipid Profile: No results for input(s): CHOL, HDL, LDLCALC, TRIG, CHOLHDL, LDLDIRECT in the last 72 hours. Anemia Panel: No results for input(s): VITAMINB12, FOLATE, FERRITIN, TIBC, IRON, RETICCTPCT in the last 72 hours. Urine analysis:    Component Value Date/Time   COLORURINE YELLOW 11/03/2018 0341   APPEARANCEUR CLOUDY (A) 11/03/2018 0341   LABSPEC 1.024 11/03/2018 0341   PHURINE 6.0 11/03/2018 0341   GLUCOSEU >=500 (A) 11/03/2018 0341   HGBUR MODERATE (A) 11/03/2018 0341   HGBUR negative 11/01/2008 1121   BILIRUBINUR NEGATIVE 11/03/2018 0341   BILIRUBINUR neg. 12/21/2012 0933   KETONESUR 80 (A) 11/03/2018 0341   PROTEINUR 30 (A) 11/03/2018 0341   UROBILINOGEN 0.2 12/21/2012 0933   UROBILINOGEN 0.2 11/01/2008 1121   NITRITE NEGATIVE 11/03/2018 0341   LEUKOCYTESUR MODERATE  (A) 11/03/2018 0341   Sepsis Labs: Invalid input(s): PROCALCITONIN, [place]  Microbiology: Recent Results (from the past 240 hour(s))  Urine culture     Status: Abnormal   Collection Time: 11/03/18  3:41 AM   Specimen: Urine, Clean Catch  Result Value Ref Range Status   Specimen Description   Final    URINE, CLEAN CATCH Performed at [facility], [place] [address], [place], [place] [address]    Special Requests   Final    NONE Performed at [facility], [place] [address] [place], [place] [address]    Culture MULTIPLE SPECIES PRESENT, SUGGEST RECOLLECTION (A)  Final   Report Status 11/04/2018 FINAL  Final  SARS CORONAVIRUS 2 (TAT 6-24 HRS) Nasopharyngeal Nasopharyngeal Swab     Status: None   Collection Time: 11/03/18  5:07 AM   Specimen: Nasopharyngeal Swab  Result Value Ref Range Status   SARS Coronavirus 2 NEGATIVE NEGATIVE Final    Comment: (NOTE) SARS-CoV-2 target nucleic acids are NOT DETECTED. The SARS-CoV-2 RNA is generally detectable in upper and lower respiratory specimens during the acute phase of infection. Negative results do not preclude SARS-CoV-2 infection, do not rule out co-infections with other pathogens, and should not be used as the sole basis for treatment or other patient management decisions. Negative results must be combined with clinical observations, patient history, and epidemiological information. The expected result is Negative. Fact Sheet for Patients: SugarRoll.be Fact Sheet for Healthcare Providers: https://www.woods-mathews.com/ This test is not yet approved or cleared by the [place] FDA and  has been authorized for detection and/or diagnosis of SARS-CoV-2 by FDA under an Emergency Use Authorization (EUA). This EUA will remain  in effect (meaning this test can be used) for the duration of the COVID-19 declaration under Section 56 4(b)(1) of the Act, 21  U.S.C. section 360bbb-3(b)(1), unless the authorization is terminated or revoked sooner. Performed at [facility], [place] [address], [place], [place] [address]   Culture, blood (routine x 2)     Status: None   Collection Time: 11/03/18  1:12 PM   Specimen: BLOOD  Result Value Ref Range Status   Specimen Description   Final    BLOOD LEFT ANTECUBITAL Performed at [place] [address], [place], [place] [address]    Special Requests   Final    BOTTLES DRAWN AEROBIC AND ANAEROBIC Blood Culture adequate volume Performed at [place] [address] [place],  [address]    Culture   Final    NO GROWTH 5 DAYS Performed at [facility], [address] [place], [place] [address]    Report Status 11/09/2018 FINAL  Final  Culture, Urine     Status: None   Collection Time: 11/04/18  7:49 PM   Specimen: Urine, Clean Catch  Result Value Ref Range Status   Specimen Description URINE, CLEAN CATCH  Final   Special Requests NONE  Final   Culture   Final    NO GROWTH Performed at [facility], [place] [address], [place], [place] [address]    Report Status 11/05/2018 FINAL  Final    Radiology Studies: No results found.  Maecyn Panning T. Gardiner  If 7PM-7AM, please contact night-coverage [email or web address] Password [email or web address] 11/09/2018, 12:05 PM

## 2018-11-10 LAB — BASIC METABOLIC PANEL
Anion gap: 11 (ref 5–15)
BUN: 10 mg/dL (ref 8–23)
CO2: 24 mmol/L (ref 22–32)
Calcium: 9.1 mg/dL (ref 8.9–10.3)
Chloride: 101 mmol/L (ref 98–111)
Creatinine, Ser: 0.52 mg/dL (ref 0.44–1.00)
GFR calc Af Amer: >60 mL/min (ref >60)
GFR calc non Af Amer: >60 mL/min (ref >60)
Glucose, Bld: 169 mg/dL — ABNORMAL HIGH (ref 70–99)
Potassium: 3.6 mmol/L (ref 3.5–5.1)
Sodium: 136 mmol/L (ref 135–145)

## 2018-11-10 LAB — GLUCOSE, CAPILLARY
Glucose-Capillary: 168 mg/dL — ABNORMAL HIGH (ref 70–99)
Glucose-Capillary: 186 mg/dL — ABNORMAL HIGH (ref 70–99)
Glucose-Capillary: 202 mg/dL — ABNORMAL HIGH (ref 70–99)
Glucose-Capillary: 188 mg/dL — ABNORMAL HIGH (ref 70–99)

## 2018-11-10 LAB — MAGNESIUM: Magnesium: 2.2 mg/dL (ref 1.7–2.4)

## 2018-11-10 NOTE — Progress Notes (Signed)
PROGRESS NOTE  Becky Stout [id number] DOB: 12-31-1955   PCP: Abner Greenspan, MD  Patient is from: Home  DOA: 11/03/2018 LOS: 7  Brief Narrative / Interim history: 63 year old female with history of DM-2, HTN, IDA, obesity and WPW syndrome presenting with increased confusion, fall and failure to thrive.  At baseline, patient lives alone.  Family brings food to her house on regular basis. Admitted for DKA, acute CVA, metabolic encephalopathy and unwitnessed fall.  Subjective: No major events overnight of this morning.  No complaints this morning.  Still confused but oriented to self, partial place, month, year and the president.  Denies chest pain, dyspnea, headache, GI or GU symptoms. Objective: Vitals:   11/09/18 1936 11/09/18 2355 11/10/18 0417 11/10/18 0726  BP: (!) 153/68 (!) 159/69 (!) 161/77 (!) 147/58  Pulse: 86 78 84 83  Resp: 16 16 18    Temp: 98.5 F (36.9 C) 98 F (36.7 C) 98.7 F (37.1 C) 98.5 F (36.9 C)  TempSrc: Oral Oral Oral Oral  SpO2: 99% 97% 97% 97%    Intake/Output Summary (Last 24 hours) at 11/10/2018 1145 Last data filed at 11/09/2018 1937 Gross per 24 hour  Intake 75 ml  Output 400 ml  Net -325 ml   There were no vitals filed for this visit.  Examination:  GENERAL: No acute distress.  Appears well.  HEENT: MMM.  Vision and hearing grossly intact.  NECK: Supple.  No apparent JVD.  RESP:  No IWOB. Good air movement bilaterally. CVS:  RRR. Heart sounds normal.  ABD/GI/GU: Bowel sounds present. Soft. Non tender.  MSK/EXT:  Moves extremities. No apparent deformity or edema.  SKIN: no apparent skin lesion or wound NEURO: Awake, alert and oriented fairly but speech content is poor.  Motor 4/5 in all extremities. PSYCH: Off-topic.  Speech content is poor  Assessment & Plan: Unwitnessed fall at home Acute CVA: MRI brain on 11/03/18 with multiple cerebral and brainstem lacunar infarcts.  Some improvement in extremity weakness today. -ASA 81  mg and Plavix for 3 weeks starting 10/12 followed by Plavix alone per neuro. -Continue statin. -No need for TEE or loop recorder per neurology. -We will start normalizing BP-discontinue IV fluid and start amlodipine -PT/OT-SNF-patient has no insurance.  Acute metabolic encephalopathy: Awake, alert and oriented fairly but confused.  Might have some baseline cognitive impairment.  No improvement with high-dose thiamine 10/14-10/16 -Frequent orientation delirium precautions -Treat treatable causes. -Continue thiamine 100 mg daily.  Hypertensive emergency: Improved.  SBP in the range of 140s to 160s. -Added amlodipine 10 mg daily on 10/15 -Increased lisinopril to 20 mg daily on 10/16-creatinine stable. -As needed labetalol for SBP greater than 180  DKA/poorly controlled diabetes with hyperglycemia: DKA resolved.  A1c 7.9%.  Not on medication at home. -Continue SSI and CBG monitoring. -We will start low-dose Metformin on discharge.  Hypothyroidism: TSH within normal -Continue Synthroid.  Pyuria: No UTI symptoms per sister at bedside.  Urine cultures negative x3. -Completed 3 days of Rocephin.  Intractable nausea/vomiting: Resolved. -As needed antiemetics  History of WPW syndrome: Normal EKG here. -Avoid nodal blocking agents  GAD/agoraphobia -Appreciate psych input-Zoloft 25 mg daily for 1 week-10/14> then increase to 50 mg daily.  Hypokalemia: Likely due to IV fluid.  Resolved. -Replenish and recheck  Failure to thrive/poor p.o. intake:  -Appreciate dietitian input-see below -Supplements, multivitamin  Malnutrition Type/Characteristics and interventions: Nutrition Problem: Inadequate oral intake Etiology: poor appetite  Signs/Symptoms: per patient/family report  Interventions: Prostat, Boost [employer]  DVT prophylaxis: Subcu Lovenox Code Status: DNR/DNI Family Communication: Updated patient's sister at bedside on 10/14.  No family member at bedside today. Disposition  Plan: Remains inpatient.  Final disposition SNF. Consultants: Neurology (signed off)  Procedures:  None  Microbiology summarized: 10/10-COVID-19 negative. 10/10-blood cultures negative. 10/10-urine culture multiple species. 10/11-urine culture negative.  Sch Meds:  Scheduled Meds: .  stroke: mapping our early stages of recovery book   Does not apply Once  . amLODipine  10 mg Oral Daily  . aspirin EC  81 mg Oral Daily  . atorvastatin  80 mg Oral q1800  . clopidogrel  75 mg Oral Daily  . enoxaparin (LOVENOX) injection  40 mg Subcutaneous Q24H  . feeding supplement  1 Container Oral TID BM  . feeding supplement (PRO-STAT SUGAR FREE 64)  30 mL Oral TID  . insulin aspart  0-15 Units Subcutaneous TID WC  . insulin aspart  0-5 Units Subcutaneous QHS  . lisinopril  20 mg Oral Daily  . pantoprazole  40 mg Oral Daily  . sertraline  25 mg Oral Daily  . sodium chloride flush  10-40 mL Intracatheter Q12H  . thiamine  100 mg Oral Daily   Continuous Infusions:  PRN Meds:.acetaminophen, labetalol, LORazepam, ondansetron (ZOFRAN) IV, sodium chloride flush, sodium chloride flush  Antimicrobials: Anti-infectives (From admission, onward)   Start     Dose/Rate Route Frequency Ordered Stop   11/03/18 1930  cefTRIAXone (ROCEPHIN) 1 g in sodium chloride 0.9 % 100 mL IVPB  Status:  Discontinued     1 g 200 mL/hr over 30 Minutes Intravenous Every 24 hours 11/03/18 1724 11/06/18 1118   11/03/18 0530  cefTRIAXone (ROCEPHIN) 1 g in sodium chloride 0.9 % 100 mL IVPB     1 g 200 mL/hr over 30 Minutes Intravenous  Once 11/03/18 0525 11/03/18 0629       I have personally reviewed the following labs and images: CBC: Recent Labs  Lab 11/04/18 0337 11/06/18 0320 11/07/18 0325  WBC 10.4 6.7 6.3  NEUTROABS 7.8*  --   --   HGB 13.5 12.6 12.3  HCT 39.0 35.8* 34.7*  MCV 90.3 87.3 87.8  PLT 287 256 265   BMP &GFR Recent Labs  Lab 11/06/18 0320 11/07/18 0325 11/08/18 0323 11/09/18 0644  11/10/18 0334  NA 135 138 134* 134* 136  K 3.0* 3.2* 3.7 3.1* 3.6  CL 102 106 101 101 101  CO2 20* 24 22 21* 24  GLUCOSE 147* 148* 156* 169* 169*  BUN 6 6* 8 5* 10  CREATININE 0.61 0.49 0.45 0.56 0.52  CALCIUM 8.3* 8.3* 8.7* 8.6* 9.1  MG 1.8 1.9 1.8 1.7 2.2   CrCl cannot be calculated (Unknown ideal weight.). Liver & Pancreas: Recent Labs  Lab 11/07/18 0325  AST 16  ALT 13  ALKPHOS 49  BILITOT 0.9  PROT 5.3*  ALBUMIN 2.8*   No results for input(s): LIPASE, AMYLASE in the last 168 hours. Recent Labs  Lab 11/03/18 1856  AMMONIA 41*   Diabetic: No results for input(s): HGBA1C in the last 72 hours. Recent Labs  Lab 11/09/18 0608 11/09/18 1136 11/09/18 1613 11/09/18 2135 11/10/18 0607  GLUCAP 173* 169* 223* 141* 168*   Cardiac Enzymes: No results for input(s): CKTOTAL, CKMB, CKMBINDEX, TROPONINI in the last 168 hours. No results for input(s): PROBNP in the last 8760 hours. Coagulation Profile: No results for input(s): INR, PROTIME in the last 168 hours. Thyroid Function Tests: No results for input(s): TSH, T4TOTAL, FREET4, T3FREE,  THYROIDAB in the last 72 hours. Lipid Profile: No results for input(s): CHOL, HDL, LDLCALC, TRIG, CHOLHDL, LDLDIRECT in the last 72 hours. Anemia Panel: No results for input(s): VITAMINB12, FOLATE, FERRITIN, TIBC, IRON, RETICCTPCT in the last 72 hours. Urine analysis:    Component Value Date/Time   COLORURINE YELLOW 11/03/2018 0341   APPEARANCEUR CLOUDY (A) 11/03/2018 0341   LABSPEC 1.024 11/03/2018 0341   PHURINE 6.0 11/03/2018 0341   GLUCOSEU >=500 (A) 11/03/2018 0341   HGBUR MODERATE (A) 11/03/2018 0341   HGBUR negative 11/01/2008 1121   BILIRUBINUR NEGATIVE 11/03/2018 0341   BILIRUBINUR neg. 12/21/2012 0933   KETONESUR 80 (A) 11/03/2018 0341   PROTEINUR 30 (A) 11/03/2018 0341   UROBILINOGEN 0.2 12/21/2012 0933   UROBILINOGEN 0.2 11/01/2008 1121   NITRITE NEGATIVE 11/03/2018 0341   LEUKOCYTESUR MODERATE (A) 11/03/2018 0341    Sepsis Labs: Invalid input(s): PROCALCITONIN, [place]  Microbiology: Recent Results (from the past 240 hour(s))  Urine culture     Status: Abnormal   Collection Time: 11/03/18  3:41 AM   Specimen: Urine, Clean Catch  Result Value Ref Range Status   Specimen Description   Final    URINE, CLEAN CATCH Performed at [facility], [place] [address] [place], [place] [address]    Special Requests   Final    NONE Performed at [facility], [place] [address], [place], [place] [address]    Culture MULTIPLE SPECIES PRESENT, SUGGEST RECOLLECTION (A)  Final   Report Status 11/04/2018 FINAL  Final  SARS CORONAVIRUS 2 (TAT 6-24 HRS) Nasopharyngeal Nasopharyngeal Swab     Status: None   Collection Time: 11/03/18  5:07 AM   Specimen: Nasopharyngeal Swab  Result Value Ref Range Status   SARS Coronavirus 2 NEGATIVE NEGATIVE Final    Comment: (NOTE) SARS-CoV-2 target nucleic acids are NOT DETECTED. The SARS-CoV-2 RNA is generally detectable in upper and lower respiratory specimens during the acute phase of infection. Negative results do not preclude SARS-CoV-2 infection, do not rule out co-infections with other pathogens, and should not be used as the sole basis for treatment or other patient management decisions. Negative results must be combined with clinical observations, patient history, and epidemiological information. The expected result is Negative. Fact Sheet for Patients: SugarRoll.be Fact Sheet for Healthcare Providers: https://www.woods-mathews.com/ This test is not yet approved or cleared by the [place] FDA and  has been authorized for detection and/or diagnosis of SARS-CoV-2 by FDA under an Emergency Use Authorization (EUA). This EUA will remain  in effect (meaning this test can be used) for the duration of the COVID-19 declaration under Section 56 4(b)(1) of the Act, 21 U.S.C. section  360bbb-3(b)(1), unless the authorization is terminated or revoked sooner. Performed at [facility], [place] [address], [place], [place] [address]   Culture, blood (routine x 2)     Status: None   Collection Time: 11/03/18  1:12 PM   Specimen: BLOOD  Result Value Ref Range Status   Specimen Description   Final    BLOOD LEFT ANTECUBITAL Performed at [place] [address], [place], [place] [address]    Special Requests   Final    BOTTLES DRAWN AEROBIC AND ANAEROBIC Blood Culture adequate volume Performed at [place] [address] [place], [place] [address]    Culture   Final    NO GROWTH 5 DAYS Performed at [facility], [place] [address], [place], [place] [address]  Report Status 11/09/2018 FINAL  Final  Culture, Urine     Status: None   Collection Time: 11/04/18  7:49 PM   Specimen: Urine, Clean Catch  Result Value Ref Range Status   Specimen Description URINE, CLEAN CATCH  Final   Special Requests NONE  Final   Culture   Final    NO GROWTH Performed at [facility], [place] [address], [place], [place] [address]    Report Status 11/05/2018 FINAL  Final    Radiology Studies: No results found.   T. Beecher  If 7PM-7AM, please contact night-coverage [email or web address] Password [email or web address] 11/10/2018, 11:45 AM

## 2018-11-11 LAB — BASIC METABOLIC PANEL
Anion gap: 11 (ref 5–15)
BUN: 13 mg/dL (ref 8–23)
CO2: 25 mmol/L (ref 22–32)
Calcium: 9.3 mg/dL (ref 8.9–10.3)
Chloride: 103 mmol/L (ref 98–111)
Creatinine, Ser: 0.58 mg/dL (ref 0.44–1.00)
GFR calc Af Amer: >60 mL/min (ref >60)
GFR calc non Af Amer: >60 mL/min (ref >60)
Glucose, Bld: 157 mg/dL — ABNORMAL HIGH (ref 70–99)
Potassium: 3.4 mmol/L — ABNORMAL LOW (ref 3.5–5.1)
Sodium: 139 mmol/L (ref 135–145)

## 2018-11-11 LAB — MAGNESIUM: Magnesium: 2 mg/dL (ref 1.7–2.4)

## 2018-11-11 LAB — GLUCOSE, CAPILLARY
Glucose-Capillary: 169 mg/dL — ABNORMAL HIGH (ref 70–99)
Glucose-Capillary: 180 mg/dL — ABNORMAL HIGH (ref 70–99)
Glucose-Capillary: 166 mg/dL — ABNORMAL HIGH (ref 70–99)
Glucose-Capillary: 117 mg/dL — ABNORMAL HIGH (ref 70–99)

## 2018-11-11 LAB — HEMOGLOBIN AND HEMATOCRIT, BLOOD
HCT: 40.9 % (ref 36.0–46.0)
Hemoglobin: 13.5 g/dL (ref 12.0–15.0)

## 2018-11-11 MED ORDER — LINAGLIPTIN 5 MG PO TABS
5.0000 mg | ORAL_TABLET | Freq: Every day | ORAL | Status: DC
  Administered 2018-11-11 – 2019-02-02 (×84): 5 mg via ORAL
  Filled 2018-11-11 (×84): qty 1

## 2018-11-11 NOTE — Plan of Care (Signed)
  Problem: Education: Goal: Knowledge of General Education information will improve Description: Including pain rating scale, medication(s)/side effects and non-pharmacologic comfort measures Outcome: Progressing   Problem: Health Behavior/Discharge Planning: Goal: Ability to manage health-related needs will improve Outcome: Progressing   Problem: Clinical Measurements: Goal: Ability to maintain clinical measurements within normal limits will improve Outcome: Progressing Goal: Will remain free from infection Outcome: Progressing Goal: Diagnostic test results will improve Outcome: Progressing Goal: Respiratory complications will improve Outcome: Progressing Goal: Cardiovascular complication will be avoided Outcome: Progressing   Problem: Activity: Goal: Risk for activity intolerance will decrease Outcome: Progressing   Problem: Nutrition: Goal: Adequate nutrition will be maintained Outcome: Progressing   Problem: Coping: Goal: Level of anxiety will decrease Outcome: Progressing   Problem: Elimination: Goal: Will not experience complications related to bowel motility Outcome: Progressing Goal: Will not experience complications related to urinary retention Outcome: Progressing   Problem: Pain Managment: Goal: General experience of comfort will improve Outcome: Progressing   Problem: Safety: Goal: Ability to remain free from injury will improve Outcome: Progressing   Problem: Skin Integrity: Goal: Risk for impaired skin integrity will decrease Outcome: Progressing   Problem: Education: Goal: Knowledge of secondary prevention will improve Outcome: Progressing Goal: Knowledge of patient specific risk factors addressed and post discharge goals established will improve Outcome: Progressing Goal: Individualized Educational Video(s) Outcome: Progressing   Problem: Coping: Goal: Will verbalize positive feelings about self Outcome: Progressing Goal: Will identify  appropriate support needs Outcome: Progressing   Problem: Self-Care: Goal: Ability to participate in self-care as condition permits will improve Outcome: Progressing Goal: Verbalization of feelings and concerns over difficulty with self-care will improve Outcome: Progressing   Problem: Nutrition: Goal: Dietary intake will improve Outcome: Progressing   Problem: Ischemic Stroke/TIA Tissue Perfusion: Goal: Complications of ischemic stroke/TIA will be minimized Outcome: Progressing   Ival Bible, BSN, RN

## 2018-11-11 NOTE — Progress Notes (Signed)
PROGRESS NOTE  Becky Stout [id number] DOB: 06/17/1955   PCP: Abner Greenspan, MD  Patient is from: Home  DOA: 11/03/2018 LOS: 8  Brief Narrative / Interim history: 63 year old female with history of DM-2, HTN, IDA, obesity and WPW syndrome presenting with increased confusion, fall and failure to thrive.  At baseline, patient lives alone.  Family brings food to her house on regular basis. Admitted for DKA, acute CVA, metabolic encephalopathy and unwitnessed fall.   Subjective: No major events overnight of this morning.  No complaint this morning.  Resting comfortably.  She is oriented to self, partial place, month, year and the president but her speech is off-topic.  Denies chest pain, dyspnea, headache, GI or GU symptoms. Objective: Vitals:   11/11/18 0450 11/11/18 0451 11/11/18 0856 11/11/18 1220  BP: 126/83  (!) 141/86 (!) 165/72  Pulse: 97  81 82  Resp: 16  18 18   Temp: 97.7 F (36.5 C)  98.8 F (37.1 C) 98.1 F (36.7 C)  TempSrc:   Oral Oral  SpO2: 98% 99% 97% 95%    Intake/Output Summary (Last 24 hours) at 11/11/2018 1337 Last data filed at 11/11/2018 0900 Gross per 24 hour  Intake 60 ml  Output 200 ml  Net -140 ml   There were no vitals filed for this visit.  Examination:  GENERAL: No acute distress.  Resting comfortably. HEENT: MMM.  Vision and hearing grossly intact.  NECK: Supple.  No apparent JVD.  RESP:  No IWOB. Good air movement bilaterally. CVS:  RRR. Heart sounds normal.  ABD/GI/GU: Bowel sounds present. Soft. Non tender.  MSK/EXT:  Moves extremities. No apparent deformity or edema.  SKIN: no apparent skin lesion or wound NEURO: Awake, alert and oriented fairly but confused and speech is off-topic.  Motor 4/5 in all extremities. PSYCH: Calm.  Confused and off topic.  Assessment & Plan: Unwitnessed fall at home Acute CVA: MRI brain on 11/03/18 with multiple cerebral and brainstem lacunar infarcts.  Some improvement in extremity weakness today.  -ASA 81 mg and Plavix for 3 weeks starting 10/12 followed by Plavix alone per neuro. -Continue statin. -No need for TEE or loop recorder per neurology. -We will start normalizing BP-discontinue IV fluid and start amlodipine -PT/OT-SNF-patient has no insurance.  Acute metabolic encephalopathy: confused despite fair orientation. No improvement with high-dose thiamine 10/14-10/16.  Might have some baseline cognitive impairment.  -Frequent orientation delirium precautions -Treat treatable causes. -Continue thiamine 100 mg daily.  Hypertensive emergency: Improved.  SBP in the range of 120s to 160s -Continue current plan. -Added amlodipine 10 mg daily on 10/15 -Increased lisinopril to 20 mg daily on 10/16-creatinine stable. -As needed labetalol for SBP greater than 180  DKA/poorly controlled diabetes with hyperglycemia: DKA resolved.  A1c 7.9%.  Not on medication at home.  CBG slightly elevated. -Added Januvia. -Continue SSI-moderate and CBG monitoring. -Consider adding Metformin on discharge.  Hypothyroidism: TSH within normal -Continue Synthroid.  Pyuria: No UTI symptoms per sister at bedside.  Urine cultures negative x3. -Completed 3 days of Rocephin.  Intractable nausea/vomiting: Resolved. -As needed antiemetics  History of WPW syndrome: Normal EKG here. -Avoid nodal blocking agents  GAD/agoraphobia -Appreciate psych input-Zoloft 25 mg daily for 1 week-10/14> then increase to 50 mg daily.  Hypokalemia:  -Replenish and recheck  Failure to thrive/poor p.o. intake:  -Appreciate dietitian input-see below -Supplements, multivitamin  Malnutrition Type/Characteristics and interventions: Nutrition Problem: Inadequate oral intake Etiology: poor appetite  Signs/Symptoms: per patient/family report  Interventions: Prostat, Boost [employer]  DVT prophylaxis: Subcu Lovenox Code Status: DNR/DNI Family Communication:  No family member at bedside today. Disposition Plan: Remains  inpatient.  Final disposition SNF. Consultants: Neurology (signed off)  Procedures:  None  Microbiology summarized: 10/10-COVID-19 negative. 10/10-blood cultures negative. 10/10-urine culture multiple species. 10/11-urine culture negative.  Sch Meds:  Scheduled Meds: .  stroke: mapping our early stages of recovery book   Does not apply Once  . amLODipine  10 mg Oral Daily  . aspirin EC  81 mg Oral Daily  . atorvastatin  80 mg Oral q1800  . clopidogrel  75 mg Oral Daily  . enoxaparin (LOVENOX) injection  40 mg Subcutaneous Q24H  . feeding supplement  1 Container Oral TID BM  . feeding supplement (PRO-STAT SUGAR FREE 64)  30 mL Oral TID  . insulin aspart  0-15 Units Subcutaneous TID WC  . insulin aspart  0-5 Units Subcutaneous QHS  . lisinopril  20 mg Oral Daily  . pantoprazole  40 mg Oral Daily  . sertraline  25 mg Oral Daily  . sodium chloride flush  10-40 mL Intracatheter Q12H  . thiamine  100 mg Oral Daily   Continuous Infusions:  PRN Meds:.acetaminophen, labetalol, LORazepam, ondansetron (ZOFRAN) IV, sodium chloride flush, sodium chloride flush  Antimicrobials: Anti-infectives (From admission, onward)   Start     Dose/Rate Route Frequency Ordered Stop   11/03/18 1930  cefTRIAXone (ROCEPHIN) 1 g in sodium chloride 0.9 % 100 mL IVPB  Status:  Discontinued     1 g 200 mL/hr over 30 Minutes Intravenous Every 24 hours 11/03/18 1724 11/06/18 1118   11/03/18 0530  cefTRIAXone (ROCEPHIN) 1 g in sodium chloride 0.9 % 100 mL IVPB     1 g 200 mL/hr over 30 Minutes Intravenous  Once 11/03/18 0525 11/03/18 0629       I have personally reviewed the following labs and images: CBC: Recent Labs  Lab 11/06/18 0320 11/07/18 0325 11/11/18 0542  WBC 6.7 6.3  --   HGB 12.6 12.3 13.5  HCT 35.8* 34.7* 40.9  MCV 87.3 87.8  --   PLT 256 265  --    BMP &GFR Recent Labs  Lab 11/07/18 0325 11/08/18 0323 11/09/18 0644 11/10/18 0334 11/11/18 0542  NA 138 134* 134* 136 139  K  3.2* 3.7 3.1* 3.6 3.4*  CL 106 101 101 101 103  CO2 24 22 21* 24 25  GLUCOSE 148* 156* 169* 169* 157*  BUN 6* 8 5* 10 13  CREATININE 0.49 0.45 0.56 0.52 0.58  CALCIUM 8.3* 8.7* 8.6* 9.1 9.3  MG 1.9 1.8 1.7 2.2 2.0   CrCl cannot be calculated (Unknown ideal weight.). Liver & Pancreas: Recent Labs  Lab 11/07/18 0325  AST 16  ALT 13  ALKPHOS 49  BILITOT 0.9  PROT 5.3*  ALBUMIN 2.8*   No results for input(s): LIPASE, AMYLASE in the last 168 hours. No results for input(s): AMMONIA in the last 168 hours. Diabetic: No results for input(s): HGBA1C in the last 72 hours. Recent Labs  Lab 11/10/18 1213 11/10/18 1644 11/10/18 2214 11/11/18 0611 11/11/18 1214  GLUCAP 186* 202* 188* 169* 180*   Cardiac Enzymes: No results for input(s): CKTOTAL, CKMB, CKMBINDEX, TROPONINI in the last 168 hours. No results for input(s): PROBNP in the last 8760 hours. Coagulation Profile: No results for input(s): INR, PROTIME in the last 168 hours. Thyroid Function Tests: No results for input(s): TSH, T4TOTAL, FREET4, T3FREE, THYROIDAB in the last 72 hours. Lipid Profile: No results for  input(s): CHOL, HDL, LDLCALC, TRIG, CHOLHDL, LDLDIRECT in the last 72 hours. Anemia Panel: No results for input(s): VITAMINB12, FOLATE, FERRITIN, TIBC, IRON, RETICCTPCT in the last 72 hours. Urine analysis:    Component Value Date/Time   COLORURINE YELLOW 11/03/2018 0341   APPEARANCEUR CLOUDY (A) 11/03/2018 0341   LABSPEC 1.024 11/03/2018 0341   PHURINE 6.0 11/03/2018 0341   GLUCOSEU >=500 (A) 11/03/2018 0341   HGBUR MODERATE (A) 11/03/2018 0341   HGBUR negative 11/01/2008 1121   BILIRUBINUR NEGATIVE 11/03/2018 0341   BILIRUBINUR neg. 12/21/2012 0933   KETONESUR 80 (A) 11/03/2018 0341   PROTEINUR 30 (A) 11/03/2018 0341   UROBILINOGEN 0.2 12/21/2012 0933   UROBILINOGEN 0.2 11/01/2008 1121   NITRITE NEGATIVE 11/03/2018 0341   LEUKOCYTESUR MODERATE (A) 11/03/2018 0341   Sepsis Labs: Invalid input(s):  PROCALCITONIN, [place]  Microbiology: Recent Results (from the past 240 hour(s))  Urine culture     Status: Abnormal   Collection Time: 11/03/18  3:41 AM   Specimen: Urine, Clean Catch  Result Value Ref Range Status   Specimen Description   Final    URINE, CLEAN CATCH Performed at [facility], [place] [address], [place], [place] [address]    Special Requests   Final    NONE Performed at [facility], [place] [address], [place], [place] [address]    Culture MULTIPLE SPECIES PRESENT, SUGGEST RECOLLECTION (A)  Final   Report Status 11/04/2018 FINAL  Final  SARS CORONAVIRUS 2 (TAT 6-24 HRS) Nasopharyngeal Nasopharyngeal Swab     Status: None   Collection Time: 11/03/18  5:07 AM   Specimen: Nasopharyngeal Swab  Result Value Ref Range Status   SARS Coronavirus 2 NEGATIVE NEGATIVE Final    Comment: (NOTE) SARS-CoV-2 target nucleic acids are NOT DETECTED. The SARS-CoV-2 RNA is generally detectable in upper and lower respiratory specimens during the acute phase of infection. Negative results do not preclude SARS-CoV-2 infection, do not rule out co-infections with other pathogens, and should not be used as the sole basis for treatment or other patient management decisions. Negative results must be combined with clinical observations, patient history, and epidemiological information. The expected result is Negative. Fact Sheet for Patients: SugarRoll.be Fact Sheet for Healthcare Providers: https://www.woods-mathews.com/ This test is not yet approved or cleared by the [place] FDA and  has been authorized for detection and/or diagnosis of SARS-CoV-2 by FDA under an Emergency Use Authorization (EUA). This EUA will remain  in effect (meaning this test can be used) for the duration of the COVID-19 declaration under Section 56 4(b)(1) of the Act, 21 U.S.C. section 360bbb-3(b)(1), unless the authorization is  terminated or revoked sooner. Performed at [facility], [place] [address] [place], [place] [address]   Culture, blood (routine x 2)     Status: None   Collection Time: 11/03/18  1:12 PM   Specimen: BLOOD  Result Value Ref Range Status   Specimen Description   Final    BLOOD LEFT ANTECUBITAL Performed at [place] [address], [place], [place] [address]    Special Requests   Final    BOTTLES DRAWN AEROBIC AND ANAEROBIC Blood Culture adequate volume Performed at [place] [address], [place], [place] [address]    Culture   Final    NO GROWTH 5 DAYS Performed at [facility], [place] [address] [place], [place] [address]    Report Status 11/09/2018 FINAL  Final  Culture, Urine  Status: None   Collection Time: 11/04/18  7:49 PM   Specimen: Urine, Clean Catch  Result Value Ref Range Status   Specimen Description URINE, CLEAN CATCH  Final   Special Requests NONE  Final   Culture   Final    NO GROWTH Performed at [facility], [address] [place], [place] [address]    Report Status 11/05/2018 FINAL  Final    Radiology Studies: No results found.  Taye T. [place]  If 7PM-7AM, please contact night-coverage [email or web address] Password [email or web address] 11/11/2018, 1:37 PM

## 2018-11-12 LAB — GLUCOSE, CAPILLARY
Glucose-Capillary: 169 mg/dL — ABNORMAL HIGH (ref 70–99)
Glucose-Capillary: 213 mg/dL — ABNORMAL HIGH (ref 70–99)
Glucose-Capillary: 149 mg/dL — ABNORMAL HIGH (ref 70–99)
Glucose-Capillary: 205 mg/dL — ABNORMAL HIGH (ref 70–99)

## 2018-11-12 MED ORDER — LABETALOL HCL 5 MG/ML IV SOLN: 10.0000 mg | INTRAVENOUS | Status: DC | PRN

## 2018-11-12 NOTE — Progress Notes (Signed)
Physical Therapy Treatment Patient Details Name: Becky Stout MRN: [id number] DOB: 05/24/1955 Today's Date: 11/12/2018    History of Present Illness 63 y.o. female with Past medical history of B12 deficiency, type II DM not on therapy, HTN, IDA, obesity, WPW syndrome.Pt dx with acute CVA, MRI reveals multiple cerebral and brainstem lacunar infarcts, acute metabolic encepholopathy, Hypertensive emergency, DKA, agorophobia, N/V, hypokalemia, and FTT.      PT Comments    Pt was able to get to EOB with supervision and significant extra time due to processing and sequencing difficulty on top of significant anxiety that she will fall.  She remains appropriate for SNF level rehab at discharge.  PT will continue to follow acutely for safe mobility progression   Follow Up Recommendations  SNF     Equipment Recommendations  Wheelchair cushion (measurements PT);Hospital bed;Rolling walker with 5" wheels;3in1 (PT);Wheelchair (measurements PT)    Recommendations for Other Services   NA     Precautions / Restrictions Precautions Precautions: Fall Precaution Comments: very fearful of falling    Mobility  Bed Mobility Overal bed mobility: Needs Assistance Bed Mobility: Rolling;Supine to Sit Rolling: Supervision   Supine to sit: Supervision Sit to supine: Min guard   General bed mobility comments: Supervision, however, it took me nearly 20 minutes to convince pt that it was safe to roll with both side rails up and to get her to sequence through the task using basic cues.  She did not want to be touched and if touched resisted.  She was completely unaware of her large BM.   Transfers Overall transfer level: Needs assistance               General transfer comment: attempted to convince pt to stand to RW, however, she sat EOB for 15 mins and could not be convinced to stand up reporting extreme fatigue.   Ambulation/Gait             General Gait Details: unable at this time.  I  spoke with pt at length re: wanting to get her back to walking, she said, "why, when I can just stay in the bed".  She had a hard time processing that she needed to walk at home PTA to get to the bathroom and kitchen.        Modified Rankin (Stroke Patients Only) Modified Rankin (Stroke Patients Only) Pre-Morbid Rankin Score: Moderate disability Modified Rankin: Severe disability     Balance Overall balance assessment: Needs assistance Sitting-balance support: Feet supported;Bilateral upper extremity supported Sitting balance-Leahy Scale: Fair Sitting balance - Comments: close supervision EOB                                    Cognition Arousal/Alertness: Awake/alert Behavior During Therapy: Anxious Overall Cognitive Status: Impaired/Different from baseline Area of Impairment: Attention;Memory;Following commands;Safety/judgement;Awareness;Problem solving                 Orientation Level: Time Current Attention Level: Selective Memory: Decreased recall of precautions;Decreased short-term memory Following Commands: Follows one step commands inconsistently;Follows one step commands with increased time Safety/Judgement: Decreased awareness of safety;Decreased awareness of deficits Awareness: Intellectual Problem Solving: Decreased initiation;Difficulty sequencing;Requires verbal cues;Requires tactile cues General Comments: Pt needs steps repeated several times, has issues processing information and then doing action asked of her without significant repetition and sequencing instruction, does not like to be assisted (likes to do it on her own, but  takes a very long time to do anything on her own).              Pertinent Vitals/Pain Pain Assessment: Faces Faces Pain Scale: Hurts even more Pain Location: genitals (red and irritated) Pain Descriptors / Indicators: Burning Pain Intervention(s): Limited activity within patient's tolerance;Monitored during  session;Repositioned;Other (comment)(cream applied, RN tech aware, no purwik)           PT Goals (current goals can now be found in the care plan section) Acute Rehab PT Goals Patient Stated Goal: to stay in bed for the rest of her life Progress towards PT goals: Progressing toward goals    Frequency    Min 3X/week      PT Plan Current plan remains appropriate       AM-PAC PT "6 Clicks" Mobility   Outcome Measure  Help needed turning from your back to your side while in a flat bed without using bedrails?: A Vespa Help needed moving from lying on your back to sitting on the side of a flat bed without using bedrails?: A Dann Help needed moving to and from a bed to a chair (including a wheelchair)?: Total Help needed standing up from a chair using your arms (e.g., wheelchair or bedside chair)?: Total Help needed to walk in hospital room?: Total Help needed climbing 3-5 steps with a railing? : Total 6 Click Score: 10    End of Session Equipment Utilized During Treatment: Gait belt Activity Tolerance: Patient limited by fatigue Patient left: in bed;with call bell/phone within reach;with bed alarm set;with nursing/sitter in room(tele sitter)   PT Visit Diagnosis: Other abnormalities of gait and mobility (R26.89)     Time: [id number] PT Time Calculation (min) (ACUTE ONLY): 47 min  Charges:  $Therapeutic Activity: 38-52 mins                    Becky Stout B. Becky Stout, PT, DPT  Acute Rehabilitation [phone] pager [phone] office  @ Becky Stout: [phone]   11/12/2018, 7:05 PM

## 2018-11-12 NOTE — Progress Notes (Signed)
PROGRESS NOTE  Becky Stout [id number] DOB: 1955/04/22   PCP: Abner Greenspan, MD  Patient is from: Home  DOA: 11/03/2018 LOS: 9  Brief Narrative / Interim history: 63 year old female with history of DM-2, HTN, IDA, obesity and WPW syndrome presenting with increased confusion, fall and failure to thrive.  At baseline, patient lives alone.  Family brings food to her house on regular basis. Admitted for DKA, acute CVA, metabolic encephalopathy and unwitnessed fall.  Currently stable but confused.  Waiting on SNF bed.  She has no insurance.  Subjective: No major events overnight of this morning.  No complaint this morning.  Resting comfortably.  She is oriented to self, partial place, month, year and the president but poor insight and off-topic.  Denies chest pain, dyspnea, headache, GI or GU symptoms.  Objective: Vitals:   11/11/18 1959 11/11/18 2353 11/12/18 0413 11/12/18 0815  BP: 122/60 (!) 128/56 (!) 130/54 (!) 146/68  Pulse: 78 81 91 89  Resp: 18 18 18 16   Temp: 98.5 F (36.9 C) 98.2 F (36.8 C) 97.9 F (36.6 C) 98 F (36.7 C)  TempSrc: Oral Oral Oral Oral  SpO2: 99% 99% 100% 100%    Intake/Output Summary (Last 24 hours) at 11/12/2018 1353 Last data filed at 11/11/2018 2000 Gross per 24 hour  Intake 0 ml  Output 650 ml  Net -650 ml   There were no vitals filed for this visit.  Examination: GENERAL: No acute distress.  Resting comfortably. HEENT: MMM.  Vision and hearing grossly intact.  NECK: Supple.  No apparent JVD.  RESP:  No IWOB. Good air movement bilaterally. CVS:  RRR. Heart sounds normal.  ABD/GI/GU: Bowel sounds present. Soft. Non tender.  MSK/EXT:  Moves extremities. No apparent deformity or edema.  SKIN: no apparent skin lesion or wound NEURO: Awake, alert and oriented fairly but confused and off topic.  Motor 4/5 in all extremities. PSYCH: Calm. Normal affect.   Assessment & Plan: Unwitnessed fall at home Acute CVA: MRI brain on 11/03/18 with  multiple cerebral and brainstem lacunar infarcts.  Some improvement in extremity weakness today. -ASA 81 mg and Plavix for 3 weeks starting 10/12 followed by Plavix alone per neuro. -Continue statin. -No need for TEE or loop recorder per neurology. -We will start normalizing BP-discontinue IV fluid and start amlodipine -PT/OT-SNF-patient has no insurance.  Acute metabolic encephalopathy: confused despite fair orientation. No improvement with high-dose thiamine 10/14-10/16.  Might have some baseline cognitive impairment.  -Frequent orientation delirium precautions -Treat treatable causes. -Continue thiamine 100 mg daily.  Hypertensive emergency: Improved.  SBP in the range of 120s to 140s. -Continue current plan. -Added amlodipine 10 mg daily on 10/15 -Increased lisinopril to 20 mg daily on 10/16-creatinine stable. -As needed labetalol for SBP greater than 170  DKA/poorly controlled diabetes with hyperglycemia: DKA resolved.  A1c 7.9%.  Not on medication at home.  CBG slightly elevated. -Added Tradjenta -Continue SSI-moderate and CBG monitoring. -Consider adding Metformin on discharge.  Hypothyroidism: TSH within normal -Continue Synthroid.  Pyuria: No UTI symptoms per sister at bedside.  Urine cultures negative x3. -Completed 3 days of Rocephin.  Intractable nausea/vomiting: Resolved. -As needed antiemetics  History of WPW syndrome: Normal EKG here. -Avoid nodal blocking agents  GAD/agoraphobia -Appreciate psych input-Zoloft 25 mg daily for 1 week-10/14> then increase to 50 mg daily.  Hypokalemia:  -Replenish and recheck  Failure to thrive/poor p.o. intake:  -Appreciate dietitian input-see below -Supplements, multivitamin  Malnutrition Type/Characteristics and interventions: Nutrition Problem: Inadequate oral  intake Etiology: poor appetite  Signs/Symptoms: per patient/family report  Interventions: Prostat, Boost Breeze   DVT prophylaxis: Subcu Lovenox Code  Status: DNR/DNI Family Communication:  No family member at bedside today. Disposition Plan: Remains inpatient.  Final disposition SNF when bed available.  Patient has insurance.. Consultants: Neurology (signed off)  Procedures:  None  Microbiology summarized: 10/10-COVID-19 negative. 10/10-blood cultures negative. 10/10-urine culture multiple species. 10/11-urine culture negative.  Sch Meds:  Scheduled Meds: .  stroke: mapping our early stages of recovery book   Does not apply Once  . amLODipine  10 mg Oral Daily  . aspirin EC  81 mg Oral Daily  . atorvastatin  80 mg Oral q1800  . clopidogrel  75 mg Oral Daily  . enoxaparin (LOVENOX) injection  40 mg Subcutaneous Q24H  . feeding supplement  1 Container Oral TID BM  . feeding supplement (PRO-STAT SUGAR FREE 64)  30 mL Oral TID  . insulin aspart  0-15 Units Subcutaneous TID WC  . insulin aspart  0-5 Units Subcutaneous QHS  . linagliptin  5 mg Oral Daily  . lisinopril  20 mg Oral Daily  . pantoprazole  40 mg Oral Daily  . sertraline  25 mg Oral Daily  . sodium chloride flush  10-40 mL Intracatheter Q12H  . thiamine  100 mg Oral Daily   Continuous Infusions:  PRN Meds:.acetaminophen, labetalol, LORazepam, ondansetron (ZOFRAN) IV, sodium chloride flush, sodium chloride flush  Antimicrobials: Anti-infectives (From admission, onward)   Start     Dose/Rate Route Frequency Ordered Stop   11/03/18 1930  cefTRIAXone (ROCEPHIN) 1 g in sodium chloride 0.9 % 100 mL IVPB  Status:  Discontinued     1 g 200 mL/hr over 30 Minutes Intravenous Every 24 hours 11/03/18 1724 11/06/18 1118   11/03/18 0530  cefTRIAXone (ROCEPHIN) 1 g in sodium chloride 0.9 % 100 mL IVPB     1 g 200 mL/hr over 30 Minutes Intravenous  Once 11/03/18 0525 11/03/18 0629       I have personally reviewed the following labs and images: CBC: Recent Labs  Lab 11/06/18 0320 11/07/18 0325 11/11/18 0542  WBC 6.7 6.3  --   HGB 12.6 12.3 13.5  HCT 35.8* 34.7* 40.9   MCV 87.3 87.8  --   PLT 256 265  --    BMP &GFR Recent Labs  Lab 11/07/18 0325 11/08/18 0323 11/09/18 0644 11/10/18 0334 11/11/18 0542  NA 138 134* 134* 136 139  K 3.2* 3.7 3.1* 3.6 3.4*  CL 106 101 101 101 103  CO2 24 22 21* 24 25  GLUCOSE 148* 156* 169* 169* 157*  BUN 6* 8 5* 10 13  CREATININE 0.49 0.45 0.56 0.52 0.58  CALCIUM 8.3* 8.7* 8.6* 9.1 9.3  MG 1.9 1.8 1.7 2.2 2.0   CrCl cannot be calculated (Unknown ideal weight.). Liver & Pancreas: Recent Labs  Lab 11/07/18 0325  AST 16  ALT 13  ALKPHOS 49  BILITOT 0.9  PROT 5.3*  ALBUMIN 2.8*   No results for input(s): LIPASE, AMYLASE in the last 168 hours. No results for input(s): AMMONIA in the last 168 hours. Diabetic: No results for input(s): HGBA1C in the last 72 hours. Recent Labs  Lab 11/11/18 0611 11/11/18 1214 11/11/18 1611 11/11/18 2112 11/12/18 1127  GLUCAP 169* 180* 166* 117* 213*   Cardiac Enzymes: No results for input(s): CKTOTAL, CKMB, CKMBINDEX, TROPONINI in the last 168 hours. No results for input(s): PROBNP in the last 8760 hours. Coagulation Profile: No results  for input(s): INR, PROTIME in the last 168 hours. Thyroid Function Tests: No results for input(s): TSH, T4TOTAL, FREET4, T3FREE, THYROIDAB in the last 72 hours. Lipid Profile: No results for input(s): CHOL, HDL, LDLCALC, TRIG, CHOLHDL, LDLDIRECT in the last 72 hours. Anemia Panel: No results for input(s): VITAMINB12, FOLATE, FERRITIN, TIBC, IRON, RETICCTPCT in the last 72 hours. Urine analysis:    Component Value Date/Time   COLORURINE YELLOW 11/03/2018 0341   APPEARANCEUR CLOUDY (A) 11/03/2018 0341   LABSPEC 1.024 11/03/2018 0341   PHURINE 6.0 11/03/2018 0341   GLUCOSEU >=500 (A) 11/03/2018 0341   HGBUR MODERATE (A) 11/03/2018 0341   HGBUR negative 11/01/2008 1121   BILIRUBINUR NEGATIVE 11/03/2018 0341   BILIRUBINUR neg. 12/21/2012 0933   KETONESUR 80 (A) 11/03/2018 0341   PROTEINUR 30 (A) 11/03/2018 0341   UROBILINOGEN  0.2 12/21/2012 0933   UROBILINOGEN 0.2 11/01/2008 1121   NITRITE NEGATIVE 11/03/2018 0341   LEUKOCYTESUR MODERATE (A) 11/03/2018 0341   Sepsis Labs: Invalid input(s): PROCALCITONIN, [place]  Microbiology: Recent Results (from the past 240 hour(s))  Urine culture     Status: Abnormal   Collection Time: 11/03/18  3:41 AM   Specimen: Urine, Clean Catch  Result Value Ref Range Status   Specimen Description   Final    URINE, CLEAN CATCH Performed at [facility], [place] [address], [place], [place] [address]    Special Requests   Final    NONE Performed at [facility], [place] [address] [place], [place] [address]    Culture MULTIPLE SPECIES PRESENT, SUGGEST RECOLLECTION (A)  Final   Report Status 11/04/2018 FINAL  Final  SARS CORONAVIRUS 2 (TAT 6-24 HRS) Nasopharyngeal Nasopharyngeal Swab     Status: None   Collection Time: 11/03/18  5:07 AM   Specimen: Nasopharyngeal Swab  Result Value Ref Range Status   SARS Coronavirus 2 NEGATIVE NEGATIVE Final    Comment: (NOTE) SARS-CoV-2 target nucleic acids are NOT DETECTED. The SARS-CoV-2 RNA is generally detectable in upper and lower respiratory specimens during the acute phase of infection. Negative results do not preclude SARS-CoV-2 infection, do not rule out co-infections with other pathogens, and should not be used as the sole basis for treatment or other patient management decisions. Negative results must be combined with clinical observations, patient history, and epidemiological information. The expected result is Negative. Fact Sheet for Patients: SugarRoll.be Fact Sheet for Healthcare Providers: https://www.woods-mathews.com/ This test is not yet approved or cleared by the [place] FDA and  has been authorized for detection and/or diagnosis of SARS-CoV-2 by FDA under an Emergency Use Authorization (EUA). This EUA will remain  in effect  (meaning this test can be used) for the duration of the COVID-19 declaration under Section 56 4(b)(1) of the Act, 21 U.S.C. section 360bbb-3(b)(1), unless the authorization is terminated or revoked sooner. Performed at [facility], [place] [address] [place], [place] [address]   Culture, blood (routine x 2)     Status: None   Collection Time: 11/03/18  1:12 PM   Specimen: BLOOD  Result Value Ref Range Status   Specimen Description   Final    BLOOD LEFT ANTECUBITAL Performed at [place] [address], [place], [place] [address]    Special Requests   Final    BOTTLES DRAWN AEROBIC AND ANAEROBIC Blood Culture adequate volume Performed at [facility] [address], [place], [place] [address]    Culture   Final  NO GROWTH 5 DAYS Performed at [facility], [place] [address], [place], [place] [address]    Report Status 11/09/2018 FINAL  Final  Culture, Urine     Status: None   Collection Time: 11/04/18  7:49 PM   Specimen: Urine, Clean Catch  Result Value Ref Range Status   Specimen Description URINE, CLEAN CATCH  Final   Special Requests NONE  Final   Culture   Final    NO GROWTH Performed at [facility], [place] [address], [place], [place] [address]    Report Status 11/05/2018 FINAL  Final    Radiology Studies: No results found.  Taye T. [place]  If 7PM-7AM, please contact night-coverage [email or web address] Password [email or web address] 11/12/2018, 1:53 PM

## 2018-11-12 NOTE — Plan of Care (Signed)
  Problem: Education: Goal: Knowledge of General Education information will improve Description: Including pain rating scale, medication(s)/side effects and non-pharmacologic comfort measures Outcome: Progressing   Problem: Health Behavior/Discharge Planning: Goal: Ability to manage health-related needs will improve Outcome: Progressing   Problem: Clinical Measurements: Goal: Ability to maintain clinical measurements within normal limits will improve Outcome: Progressing Goal: Will remain free from infection Outcome: Progressing Goal: Diagnostic test results will improve Outcome: Progressing Goal: Respiratory complications will improve Outcome: Progressing Goal: Cardiovascular complication will be avoided Outcome: Progressing   Problem: Activity: Goal: Risk for activity intolerance will decrease Outcome: Progressing   Problem: Nutrition: Goal: Adequate nutrition will be maintained Outcome: Progressing   Problem: Coping: Goal: Level of anxiety will decrease Outcome: Progressing   Problem: Elimination: Goal: Will not experience complications related to bowel motility Outcome: Progressing Goal: Will not experience complications related to urinary retention Outcome: Progressing   Problem: Pain Managment: Goal: General experience of comfort will improve Outcome: Progressing   Problem: Safety: Goal: Ability to remain free from injury will improve Outcome: Progressing   Problem: Skin Integrity: Goal: Risk for impaired skin integrity will decrease Outcome: Progressing   Problem: Education: Goal: Knowledge of secondary prevention will improve Outcome: Progressing Goal: Knowledge of patient specific risk factors addressed and post discharge goals established will improve Outcome: Progressing Goal: Individualized Educational Video(s) Outcome: Progressing   Problem: Coping: Goal: Will verbalize positive feelings about self Outcome: Progressing Goal: Will identify  appropriate support needs Outcome: Progressing   Problem: Self-Care: Goal: Ability to participate in self-care as condition permits will improve Outcome: Progressing Goal: Verbalization of feelings and concerns over difficulty with self-care will improve Outcome: Progressing   Problem: Nutrition: Goal: Dietary intake will improve Outcome: Progressing   Problem: Ischemic Stroke/TIA Tissue Perfusion: Goal: Complications of ischemic stroke/TIA will be minimized Outcome: Progressing   Ival Bible, BSN, RN

## 2018-11-12 NOTE — Progress Notes (Addendum)
Nutrition Follow-up  DOCUMENTATION CODES:   Not applicable  INTERVENTION:  Continue Boost Breeze po TID, each supplement provides 250 kcal and 9 grams of protein  Continue Prostat 30 ml po TID, each supplement provides 100 kcal and 15 grams of protein  Consider appetite stimulant  Recommend obtaining new weight to fully assess weight trends Monitor magnesium, potassium, and phosphorus daily for at least 3 days, MD to replete as needed, as pt is at risk for refeeding syndrome given FTT.  NUTRITION DIAGNOSIS:   Inadequate oral intake related to poor appetite as evidenced by per patient/family report.; ongoing  GOAL:   Patient will meet greater than or equal to 90% of their needs Progressing  MONITOR:   PO intake, Supplement acceptance, Skin, Weight trends, Labs, I & O's  REASON FOR ASSESSMENT:   Consult Assessment of nutrition requirement/status  ASSESSMENT:   63 y.o. female with Past medical history of B12 deficiency, type II DM not on therapy, HTN, CHF, IDA, obesity, WPW syndrome.Patient was brought into secondary to increasing confusion as well as failure to thrive and a fall.62 y.o. female with Past medical history of B12 deficiency, type II DM not on therapy, HTN, IDA, obesity, WPW syndrome.Patient was brought into secondary to increasing confusion as well as failure to thrive and a fall. Scattered small acute subcortical infarcts in both cerebral hemispheres and brainstem.  Patient with breakfast tray at bedside, RD asked patient if she had any of breakfast, patient stated "no thank you" She endorses drinking Boost Breeze nutrition supplement, but stated she continues with very poor meal intake; currently eating 0-25% of last 8 documented meals. When at home, patient reports 3 meals/day; each meal is from [place]. She recalls fried breakfast filet in the morning; lunch she has a fried chicken sandwich, and orders the [place] wrap for dinner. Patient denied RD offer of  magic cup/ensure and stated that she only has a taste for specific foods. RD encouraged patient meal po intake; patient agreeable to trying a bite at each meal and continuing Boost nutrition supplement. Patient may benefit from appetite stimulant.   Current weight needed to assess weight trends; Patient's current weight 83.2 kg was taken in May 2019  Medications reviewed and include: SSI, protonix, thaimaine  Labs: K 3.4  Diet Order:   Diet Order            Diet Carb Modified Fluid consistency: Thin; Room service appropriate? Yes  Diet effective now              EDUCATION NEEDS:   Not appropriate for education at this time  Skin:  Skin Assessment: Reviewed RN Assessment  Last BM:  Unknown  Height:   Ht Readings from Last 1 Encounters:  05/26/17 5\' 4"  (1.626 m)    Weight:   Wt Readings from Last 1 Encounters:  05/26/17 83.2 kg    Ideal Body Weight:  54.5 kg  BMI:  There is no height or weight on file to calculate BMI.  Estimated Nutritional Needs:   Kcal:  1800-2000  Protein:  85-100 grams  Fluid:  >/= 1.8 L/day   Lajuan Lines, RD, LDN Clinical Nutrition [place] Telephone [phone] After Hours/Weekend Pager: [phone]

## 2018-11-13 LAB — CBC
HCT: 41.7 % (ref 36.0–46.0)
Hemoglobin: 14 g/dL (ref 12.0–15.0)
MCH: 30.6 pg (ref 26.0–34.0)
MCHC: 33.6 g/dL (ref 30.0–36.0)
MCV: 91.2 fL (ref 80.0–100.0)
Platelets: 321 10*3/uL (ref 150–400)
RBC: 4.57 MIL/uL (ref 3.87–5.11)
RDW: 12.7 % (ref 11.5–15.5)
WBC: 10 10*3/uL (ref 4.0–10.5)
nRBC: 0 % (ref 0.0–0.2)

## 2018-11-13 LAB — BASIC METABOLIC PANEL
Anion gap: 13 (ref 5–15)
BUN: 15 mg/dL (ref 8–23)
CO2: 24 mmol/L (ref 22–32)
Calcium: 9.4 mg/dL (ref 8.9–10.3)
Chloride: 102 mmol/L (ref 98–111)
Creatinine, Ser: 0.6 mg/dL (ref 0.44–1.00)
GFR calc Af Amer: >60 mL/min (ref >60)
GFR calc non Af Amer: >60 mL/min (ref >60)
Glucose, Bld: 155 mg/dL — ABNORMAL HIGH (ref 70–99)
Potassium: 3 mmol/L — ABNORMAL LOW (ref 3.5–5.1)
Sodium: 139 mmol/L (ref 135–145)

## 2018-11-13 LAB — MAGNESIUM: Magnesium: 2 mg/dL (ref 1.7–2.4)

## 2018-11-13 LAB — GLUCOSE, CAPILLARY
Glucose-Capillary: 194 mg/dL — ABNORMAL HIGH (ref 70–99)
Glucose-Capillary: 199 mg/dL — ABNORMAL HIGH (ref 70–99)
Glucose-Capillary: 168 mg/dL — ABNORMAL HIGH (ref 70–99)
Glucose-Capillary: 252 mg/dL — ABNORMAL HIGH (ref 70–99)

## 2018-11-13 MED ORDER — POTASSIUM CHLORIDE CRYS ER 20 MEQ PO TBCR
30.0000 meq | EXTENDED_RELEASE_TABLET | ORAL | Status: AC
  Administered 2018-11-13 (×2): 30 meq via ORAL
  Filled 2018-11-13 (×2): qty 1

## 2018-11-13 NOTE — Progress Notes (Signed)
Potassium 3.0, Dr. Baltazar Najjar informed.

## 2018-11-13 NOTE — Progress Notes (Signed)
PROGRESS NOTE  Becky Stout [id number] DOB: 12-20-1955   PCP: Abner Greenspan, MD  Patient is from: Home  DOA: 11/03/2018 LOS: 29  Brief Narrative / Interim history: 63 year old female with history of DM-2, HTN, IDA, obesity and WPW syndrome presenting with increased confusion, fall and failure to thrive.  At baseline, patient lives alone.  Family brings food to her house on regular basis. Admitted for DKA, acute CVA, metabolic encephalopathy and unwitnessed fall.  Currently stable but confused.  Waiting on SNF bed.  She has no insurance.  Subjective: Patient seen and examined at bedside no acute distress and resting comfortably.  No events overnight.  Tolerating diet.  Denies any chest pain, shortness of breath, fever, nausea, vomiting, urinary complaints.  Otherwise ROS negative   Objective: Vitals:   11/13/18 0500 11/13/18 0525 11/13/18 0748 11/13/18 1130  BP:   (!) 112/42 (!) 123/91  Pulse:   89 79  Resp:   16 15  Temp:   98.1 F (36.7 C) 98.7 F (37.1 C)  TempSrc:   Oral Oral  SpO2:   97% 96%  Weight: 93.4 kg 93.4 kg      Intake/Output Summary (Last 24 hours) at 11/13/2018 1335 Last data filed at 11/13/2018 1233 Gross per 24 hour  Intake 0 ml  Output -  Net 0 ml   Filed Weights   11/13/18 0500 11/13/18 0525  Weight: 93.4 kg 93.4 kg    Examination: Physical Exam Vitals signs and nursing note reviewed.  Constitutional:      Appearance: Normal appearance.  HENT:     Head: Normocephalic.  Eyes:     Extraocular Movements: Extraocular movements intact.  Neck:     Musculoskeletal: Normal range of motion.  Cardiovascular:     Rate and Rhythm: Normal rate and regular rhythm.  Pulmonary:     Effort: Pulmonary effort is normal.     Breath sounds: Normal breath sounds.  Abdominal:     General: Abdomen is flat.     Palpations: Abdomen is soft.  Skin:    General: Skin is warm.     Coloration: Skin is not jaundiced.  Neurological:     General: No focal  deficit present.     Mental Status: She is alert. Mental status is at baseline.     Comments: AOx2 Appears at baseline Suspected baseline cognitive impairment  Psychiatric:        Mood and Affect: Mood normal.        Behavior: Behavior normal.     Assessment & Plan: Unwitnessed fall at home Acute CVA: MRI brain on 11/03/18 with multiple cerebral and brainstem lacunar infarcts. Amlodipine started this visit -ASA 81 mg and Plavix for 3 weeks starting 10/12 followed by Plavix alone per neuro. -Continue statin. -No need for TEE or loop recorder per neurology. -PT/OT-SNF-patient has no insurance.  Acute metabolic encephalopathy: confused despite fair orientation. No improvement with high-dose thiamine 10/14-10/16.  Might have some baseline cognitive impairment.  -Frequent orientation delirium precautions -Treat treatable causes. -Continue thiamine 100 mg daily.  Hypertensive emergency: resolved.   -Continue current plan. -Added amlodipine 10 mg daily on 10/15 -Increased lisinopril to 20 mg daily on 10/16-creatinine stable. -As needed labetalol for SBP greater than 170  DKA/poorly controlled diabetes with hyperglycemia: DKA resolved.  A1c 7.9%.  Not on medication at home.  CBG slightly elevated. -Tradjenta added this hospitalization -Continue SSI-moderate and CBG monitoring. -Consider adding Metformin on discharge.  Hypothyroidism: TSH within normal -Continue Synthroid.  Pyuria: No UTI symptoms per sister at bedside.  Urine cultures negative x3. -Completed 3 days of Rocephin.  Intractable nausea/vomiting: Resolved. -As needed antiemetics  History of WPW syndrome: Normal EKG here. -Avoid nodal blocking agents  GAD/agoraphobia -Appreciate psych input-Zoloft 25 mg daily for 1 week-10/14> then increase to 50 mg daily.  Hypokalemia: K3.0 today -Replenish and recheck  Failure to thrive/poor p.o. intake:  -Appreciate dietitian input-see below -Supplements, multivitamin   Malnutrition Type/Characteristics and interventions: Nutrition Problem: Inadequate oral intake Etiology: poor appetite  Signs/Symptoms: per patient/family report  Interventions: Prostat, Boost Breeze   DVT prophylaxis: Subcu Lovenox Code Status: DNR/DNI Family Communication:  No family member at bedside today. Disposition Plan: Remains inpatient.  Final disposition SNF when bed available.  Patient has insurance.. Consultants: Neurology (signed off)  Procedures:  None  Microbiology summarized: 10/10-COVID-19 negative. 10/10-blood cultures negative. 10/10-urine culture multiple species. 10/11-urine culture negative.  Sch Meds:  Scheduled Meds: .  stroke: mapping our early stages of recovery book   Does not apply Once  . amLODipine  10 mg Oral Daily  . aspirin EC  81 mg Oral Daily  . atorvastatin  80 mg Oral q1800  . clopidogrel  75 mg Oral Daily  . enoxaparin (LOVENOX) injection  40 mg Subcutaneous Q24H  . feeding supplement  1 Container Oral TID BM  . feeding supplement (PRO-STAT SUGAR FREE 64)  30 mL Oral TID  . insulin aspart  0-15 Units Subcutaneous TID WC  . insulin aspart  0-5 Units Subcutaneous QHS  . linagliptin  5 mg Oral Daily  . lisinopril  20 mg Oral Daily  . pantoprazole  40 mg Oral Daily  . sertraline  25 mg Oral Daily  . sodium chloride flush  10-40 mL Intracatheter Q12H  . thiamine  100 mg Oral Daily   Continuous Infusions:  PRN Meds:.acetaminophen, labetalol, LORazepam, ondansetron (ZOFRAN) IV, sodium chloride flush, sodium chloride flush  Antimicrobials: Anti-infectives (From admission, onward)   Start     Dose/Rate Route Frequency Ordered Stop   11/03/18 1930  cefTRIAXone (ROCEPHIN) 1 g in sodium chloride 0.9 % 100 mL IVPB  Status:  Discontinued     1 g 200 mL/hr over 30 Minutes Intravenous Every 24 hours 11/03/18 1724 11/06/18 1118   11/03/18 0530  cefTRIAXone (ROCEPHIN) 1 g in sodium chloride 0.9 % 100 mL IVPB     1 g 200 mL/hr over 30 Minutes  Intravenous  Once 11/03/18 0525 11/03/18 0629       I have personally reviewed the following labs and images: CBC: Recent Labs  Lab 11/07/18 0325 11/11/18 0542 11/13/18 0213  WBC 6.3  --  10.0  HGB 12.3 13.5 14.0  HCT 34.7* 40.9 41.7  MCV 87.8  --  91.2  PLT 265  --  321   BMP &GFR Recent Labs  Lab 11/08/18 0323 11/09/18 0644 11/10/18 0334 11/11/18 0542 11/13/18 0213  NA 134* 134* 136 139 139  K 3.7 3.1* 3.6 3.4* 3.0*  CL 101 101 101 103 102  CO2 22 21* 24 25 24   GLUCOSE 156* 169* 169* 157* 155*  BUN 8 5* 10 13 15   CREATININE 0.45 0.56 0.52 0.58 0.60  CALCIUM 8.7* 8.6* 9.1 9.3 9.4  MG 1.8 1.7 2.2 2.0 2.0   CrCl cannot be calculated (Unknown ideal weight.). Liver & Pancreas: Recent Labs  Lab 11/07/18 0325  AST 16  ALT 13  ALKPHOS 49  BILITOT 0.9  PROT 5.3*  ALBUMIN 2.8*   No  results for input(s): LIPASE, AMYLASE in the last 168 hours. No results for input(s): AMMONIA in the last 168 hours. Diabetic: No results for input(s): HGBA1C in the last 72 hours. Recent Labs  Lab 11/12/18 1127 11/12/18 1612 11/12/18 2105 11/13/18 0606 11/13/18 1157  GLUCAP 213* 149* 205* 194* 199*   Cardiac Enzymes: No results for input(s): CKTOTAL, CKMB, CKMBINDEX, TROPONINI in the last 168 hours. No results for input(s): PROBNP in the last 8760 hours. Coagulation Profile: No results for input(s): INR, PROTIME in the last 168 hours. Thyroid Function Tests: No results for input(s): TSH, T4TOTAL, FREET4, T3FREE, THYROIDAB in the last 72 hours. Lipid Profile: No results for input(s): CHOL, HDL, LDLCALC, TRIG, CHOLHDL, LDLDIRECT in the last 72 hours. Anemia Panel: No results for input(s): VITAMINB12, FOLATE, FERRITIN, TIBC, IRON, RETICCTPCT in the last 72 hours. Urine analysis:    Component Value Date/Time   COLORURINE YELLOW 11/03/2018 0341   APPEARANCEUR CLOUDY (A) 11/03/2018 0341   LABSPEC 1.024 11/03/2018 0341   PHURINE 6.0 11/03/2018 0341   GLUCOSEU >=500 (A)  11/03/2018 0341   HGBUR MODERATE (A) 11/03/2018 0341   HGBUR negative 11/01/2008 1121   BILIRUBINUR NEGATIVE 11/03/2018 0341   BILIRUBINUR neg. 12/21/2012 0933   KETONESUR 80 (A) 11/03/2018 0341   PROTEINUR 30 (A) 11/03/2018 0341   UROBILINOGEN 0.2 12/21/2012 0933   UROBILINOGEN 0.2 11/01/2008 1121   NITRITE NEGATIVE 11/03/2018 0341   LEUKOCYTESUR MODERATE (A) 11/03/2018 0341   Sepsis Labs: Invalid input(s): PROCALCITONIN, [place]  Microbiology: Recent Results (from the past 240 hour(s))  Culture, Urine     Status: None   Collection Time: 11/04/18  7:49 PM   Specimen: Urine, Clean Catch  Result Value Ref Range Status   Specimen Description URINE, CLEAN CATCH  Final   Special Requests NONE  Final   Culture   Final    NO GROWTH Performed at [facility], [address] [place], [place] [address]    Report Status 11/05/2018 FINAL  Final    Radiology Studies: No results found.  Marva Panda, DO [facility]  If 7PM-7AM, please contact night-coverage [email or web address] Password [email or web address] 11/13/2018, 1:35 PM

## 2018-11-14 LAB — GLUCOSE, CAPILLARY
Glucose-Capillary: 163 mg/dL — ABNORMAL HIGH (ref 70–99)
Glucose-Capillary: 167 mg/dL — ABNORMAL HIGH (ref 70–99)
Glucose-Capillary: 151 mg/dL — ABNORMAL HIGH (ref 70–99)
Glucose-Capillary: 200 mg/dL — ABNORMAL HIGH (ref 70–99)

## 2018-11-14 LAB — BASIC METABOLIC PANEL
Anion gap: 11 (ref 5–15)
BUN: 13 mg/dL (ref 8–23)
CO2: 24 mmol/L (ref 22–32)
Calcium: 9.5 mg/dL (ref 8.9–10.3)
Chloride: 105 mmol/L (ref 98–111)
Creatinine, Ser: 0.55 mg/dL (ref 0.44–1.00)
GFR calc Af Amer: >60 mL/min (ref >60)
GFR calc non Af Amer: >60 mL/min (ref >60)
Glucose, Bld: 154 mg/dL — ABNORMAL HIGH (ref 70–99)
Potassium: 3.8 mmol/L (ref 3.5–5.1)
Sodium: 140 mmol/L (ref 135–145)

## 2018-11-14 MED ORDER — SODIUM CHLORIDE 0.9 % IV SOLN
INTRAVENOUS | Status: DC
  Administered 2018-11-15: 06:00:00 via INTRAVENOUS

## 2018-11-14 MED ORDER — SERTRALINE HCL 50 MG PO TABS
50.0000 mg | ORAL_TABLET | Freq: Every day | ORAL | Status: DC
  Administered 2018-11-15 – 2019-01-02 (×49): 50 mg via ORAL
  Filled 2018-11-14 (×49): qty 1

## 2018-11-14 NOTE — Progress Notes (Signed)
PT Cancellation Note  Patient Details Name: Becky Stout MRN: [id number] DOB: 01-Oct-1955   Cancelled Treatment:    Reason Eval/Treat Not Completed: Patient declined, no reason specified pt refusing therapy session, stating, "this is my rest day." Extensive education provided on importance of out of bed mobility but pt continuing to adamantly refuse.   [name], PT, DPT Acute Rehabilitation Services Pager [phone] Office [phone]    [name] 11/14/2018, 2:25 PM

## 2018-11-14 NOTE — Progress Notes (Signed)
PROGRESS NOTE    Becky Stout  [id number] DOB: July 14, 1955 DOA: 11/03/2018 PCP: Abner Greenspan, MD     Brief Narrative:  Becky Stout is a 63 year old female with history of DM-2, HTN, IDA, obesity and WPW syndrome presenting with increased confusion, fall and failure to thrive.  At baseline, patient lives alone.  Family brings food to her house on regular basis. She was admitted for DKA, acute CVA, metabolic encephalopathy and unwitnessed fall.   New events last 24 hours / Subjective: No new issues or complaints today. Alert and oriented x 3 but poor historian.   Assessment & Plan:   Principal Problem:   Failure to thrive in adult Active Problems:   Hypothyroidism   B12 deficiency   WOLFF (WOLFE)-PARKINSON-WHITE (WPW) SYNDROME   Essential hypertension   Hyperlipidemia   Poorly controlled type 2 diabetes mellitus (HCC)   Anxiety   Acute lower UTI   High anion gap metabolic acidosis   DKA (diabetic ketoacidoses) (HCC)   Cerebral thrombosis with cerebral infarction    Acute CVA -MRI brain on 11/03/18 with multiple cerebral and brainstem lacunar infarcts -ASA 81 mg and Plavix for 3 weeks starting 10/12 followed by Plavix alone per neuro. -Continue statin. -No need for TEE or loop recorder per neurology. -PT/OT rec SNF placement pending. Patient has no insurance.  Acute metabolic encephalopathy -Confused despite fair orientation. No improvement with high-dose thiamine 10/14-10/16.  Might have some baseline cognitive impairment.  -Frequent orientation delirium precautions -Continue thiamine 100 mg daily.  Hypertensive emergency: resolved.   -Added amlodipine 10 mg daily on 10/15 -Increased lisinopril to 20 mg daily on 10/16 -As needed labetalol for SBP greater than 170  DKA/poorly controlled diabetes with hyperglycemia -DKA resolved.  A1c 7.9%.  Not on medication at home.  CBG slightly elevated. -Tradjenta added this hospitalization -Continue SSI -Consider  adding Metformin on discharge.  Hypothyroidism -TSH within normal -Continue Synthroid  Pyuria -No UTI symptoms per sister at bedside.  Urine cultures negative x3. -Completed 3 days of Rocephin.  Intractable nausea/vomiting -Resolved  History of WPW syndrome -Normal EKG here. -Avoid nodal blocking agents  GAD/agoraphobia -Appreciate psych input. Started on Zoloft   Failure to thrive/poor p.o. intake -Appreciate dietitian input -Supplements, multivitamin    DVT prophylaxis: Lovenox Code Status: DNR Family Communication: None at bedside Disposition Plan: Pending SNF placement    Consultants:   Neurology   Psychiatry   Procedures:   None   Antimicrobials:  Anti-infectives (From admission, onward)   Start     Dose/Rate Route Frequency Ordered Stop   11/03/18 1930  cefTRIAXone (ROCEPHIN) 1 g in sodium chloride 0.9 % 100 mL IVPB  Status:  Discontinued     1 g 200 mL/hr over 30 Minutes Intravenous Every 24 hours 11/03/18 1724 11/06/18 1118   11/03/18 0530  cefTRIAXone (ROCEPHIN) 1 g in sodium chloride 0.9 % 100 mL IVPB     1 g 200 mL/hr over 30 Minutes Intravenous  Once 11/03/18 0525 11/03/18 0629       Objective: Vitals:   11/14/18 0357 11/14/18 0755 11/14/18 1011 11/14/18 1129  BP: 130/64 (!) 101/43 132/62 (!) 120/46  Pulse: 73 71  77  Resp: 17 16  16   Temp: 98.4 F (36.9 C) 98.5 F (36.9 C)  98.4 F (36.9 C)  TempSrc: Oral Oral  Oral  SpO2: 95% 96%  95%  Weight:        Intake/Output Summary (Last 24 hours) at 11/14/2018 1245 Last data filed  at 11/13/2018 2011 Gross per 24 hour  Intake -  Output 550 ml  Net -550 ml   Filed Weights   11/13/18 0500 11/13/18 0525  Weight: 93.4 kg 93.4 kg    Examination:  General exam: Appears calm and comfortable, poor dentition  Respiratory system: Clear to auscultation. Respiratory effort normal. No respiratory distress. No conversational dyspnea.  Cardiovascular system: S1 & S2 heard, RRR. No  murmurs. No pedal edema. Gastrointestinal system: Abdomen is nondistended, soft and nontender. Normal bowel sounds heard. Central nervous system: Alert and oriented. No focal neurological deficits. Speech clear.  Extremities: Symmetric in appearance  Skin: No rashes, lesions or ulcers on exposed skin  Psychiatry: Judgement and insight appear stable   Data Reviewed: I have personally reviewed following labs and imaging studies  CBC: Recent Labs  Lab 11/11/18 0542 11/13/18 0213  WBC  --  10.0  HGB 13.5 14.0  HCT 40.9 41.7  MCV  --  91.2  PLT  --  [id number]   Basic Metabolic Panel: Recent Labs  Lab 11/08/18 0323 11/09/18 0644 11/10/18 0334 11/11/18 0542 11/13/18 0213 11/14/18 0934  NA 134* 134* 136 139 139 140  K 3.7 3.1* 3.6 3.4* 3.0* 3.8  CL 101 101 101 103 102 105  CO2 22 21* 24 25 24 24   GLUCOSE 156* 169* 169* 157* 155* 154*  BUN 8 5* 10 13 15 13   CREATININE 0.45 0.56 0.52 0.58 0.60 0.55  CALCIUM 8.7* 8.6* 9.1 9.3 9.4 9.5  MG 1.8 1.7 2.2 2.0 2.0  --    GFR: CrCl cannot be calculated (Unknown ideal weight.). Liver Function Tests: No results for input(s): AST, ALT, ALKPHOS, BILITOT, PROT, ALBUMIN in the last 168 hours. No results for input(s): LIPASE, AMYLASE in the last 168 hours. No results for input(s): AMMONIA in the last 168 hours. Coagulation Profile: No results for input(s): INR, PROTIME in the last 168 hours. Cardiac Enzymes: No results for input(s): CKTOTAL, CKMB, CKMBINDEX, TROPONINI in the last 168 hours. BNP (last 3 results) No results for input(s): PROBNP in the last 8760 hours. HbA1C: No results for input(s): HGBA1C in the last 72 hours. CBG: Recent Labs  Lab 11/13/18 1157 11/13/18 1550 11/13/18 2106 11/14/18 0637 11/14/18 1125  GLUCAP 199* 168* 252* 163* 167*   Lipid Profile: No results for input(s): CHOL, HDL, LDLCALC, TRIG, CHOLHDL, LDLDIRECT in the last 72 hours. Thyroid Function Tests: No results for input(s): TSH, T4TOTAL, FREET4,  T3FREE, THYROIDAB in the last 72 hours. Anemia Panel: No results for input(s): VITAMINB12, FOLATE, FERRITIN, TIBC, IRON, RETICCTPCT in the last 72 hours. Sepsis Labs: No results for input(s): PROCALCITON, LATICACIDVEN in the last 168 hours.  Recent Results (from the past 240 hour(s))  Culture, Urine     Status: None   Collection Time: 11/04/18  7:49 PM   Specimen: Urine, Clean Catch  Result Value Ref Range Status   Specimen Description URINE, CLEAN CATCH  Final   Special Requests NONE  Final   Culture   Final    NO GROWTH Performed at [facility], [address], [place], [place] [address]    Report Status 11/05/2018 FINAL  Final      Radiology Studies: No results found.    Scheduled Meds: . amLODipine  10 mg Oral Daily  . aspirin EC  81 mg Oral Daily  . atorvastatin  80 mg Oral q1800  . clopidogrel  75 mg Oral Daily  . enoxaparin (LOVENOX) injection  40 mg Subcutaneous Q24H  .  feeding supplement  1 Container Oral TID BM  . feeding supplement (PRO-STAT SUGAR FREE 64)  30 mL Oral TID  . insulin aspart  0-15 Units Subcutaneous TID WC  . insulin aspart  0-5 Units Subcutaneous QHS  . linagliptin  5 mg Oral Daily  . lisinopril  20 mg Oral Daily  . pantoprazole  40 mg Oral Daily  . [START ON 11/15/2018] sertraline  50 mg Oral Daily  . sodium chloride flush  10-40 mL Intracatheter Q12H  . thiamine  100 mg Oral Daily   Continuous Infusions:   LOS: 11 days      Time spent: 35 minutes   Dessa Phi, DO [facility] 11/14/2018, 12:45 PM   Available via Epic secure chat 7am-7pm After these hours, please refer to coverage provider listed on [email or web address]

## 2018-11-15 LAB — GLUCOSE, CAPILLARY
Glucose-Capillary: 198 mg/dL — ABNORMAL HIGH (ref 70–99)
Glucose-Capillary: 225 mg/dL — ABNORMAL HIGH (ref 70–99)
Glucose-Capillary: 232 mg/dL — ABNORMAL HIGH (ref 70–99)
Glucose-Capillary: 105 mg/dL — ABNORMAL HIGH (ref 70–99)
Glucose-Capillary: 210 mg/dL — ABNORMAL HIGH (ref 70–99)

## 2018-11-15 NOTE — Progress Notes (Signed)
PROGRESS NOTE    Becky Stout  [id number] DOB: [birth year] DOA: 11/03/2018 PCP: Abner Greenspan, MD     Brief Narrative:  Becky Stout is a 63 year old female with history of DM-2, HTN, IDA, obesity and WPW syndrome presenting with increased confusion, fall and failure to thrive.  At baseline, patient lives alone.  Family brings food to her house on regular basis. She was admitted for DKA, acute CVA, metabolic encephalopathy and unwitnessed fall.   New events last 24 hours / Subjective: States that she did not want to eat anything yesterday, but thinks that she will eat later today.  No new complaints.  Assessment & Plan:   Principal Problem:   Failure to thrive in adult Active Problems:   Hypothyroidism   B12 deficiency   WOLFF (WOLFE)-PARKINSON-WHITE (WPW) SYNDROME   Essential hypertension   Hyperlipidemia   Poorly controlled type 2 diabetes mellitus (HCC)   Anxiety   Acute lower UTI   High anion gap metabolic acidosis   DKA (diabetic ketoacidoses) (HCC)   Cerebral thrombosis with cerebral infarction    Acute CVA -MRI brain on 11/03/18 with multiple cerebral and brainstem lacunar infarcts -ASA 81 mg and Plavix for 3 weeks starting 10/12 followed by Plavix alone per neuro. -Continue statin. -No need for TEE or loop recorder per neurology. -PT/OT rec SNF placement pending. Patient has no insurance.  Acute metabolic encephalopathy -Confused despite fair orientation. No improvement with high-dose thiamine 10/14-10/16.  Might have some baseline cognitive impairment.  -Frequent orientation delirium precautions -Continue thiamine 100 mg daily.  Hypertensive emergency: resolved.   -Added amlodipine 10 mg daily on 10/15 -Increased lisinopril to 20 mg daily on 10/16 -As needed labetalol for SBP greater than 170  DKA/poorly controlled diabetes with hyperglycemia -DKA resolved.  A1c 7.9%.  Not on medication at home.  CBG slightly elevated. -Tradjenta added this  hospitalization -Continue SSI -Consider adding Metformin on discharge.  Hypothyroidism -TSH within normal -Continue Synthroid  Pyuria -No UTI symptoms per sister at bedside.  Urine cultures negative x3. -Completed 3 days of Rocephin.  Intractable nausea/vomiting -Resolved  History of WPW syndrome -Normal EKG here. -Avoid nodal blocking agents  GAD/agoraphobia -Appreciate psych input. Started on Zoloft   Failure to thrive/poor p.o. intake -Appreciate dietitian input -Supplements, multivitamin    DVT prophylaxis: Lovenox Code Status: DNR Family Communication: None at bedside Disposition Plan: Pending SNF placement    Consultants:   Neurology   Psychiatry   Procedures:   None   Antimicrobials:  Anti-infectives (From admission, onward)   Start     Dose/Rate Route Frequency Ordered Stop   11/03/18 1930  cefTRIAXone (ROCEPHIN) 1 g in sodium chloride 0.9 % 100 mL IVPB  Status:  Discontinued     1 g 200 mL/hr over 30 Minutes Intravenous Every 24 hours 11/03/18 1724 11/06/18 1118   11/03/18 0530  cefTRIAXone (ROCEPHIN) 1 g in sodium chloride 0.9 % 100 mL IVPB     1 g 200 mL/hr over 30 Minutes Intravenous  Once 11/03/18 0525 11/03/18 0629       Objective: Vitals:   11/15/18 0433 11/15/18 0500 11/15/18 0838 11/15/18 1158  BP: (!) 134/59  (!) 152/66 (!) 153/62  Pulse: 68  76 75  Resp: 17  18 18   Temp: 97.8 F (36.6 C)  (!) 97.4 F (36.3 C) 97.8 F (36.6 C)  TempSrc: Oral  Oral Oral  SpO2: 97%  97% 98%  Weight:  94.8 kg      Intake/Output  Summary (Last 24 hours) at 11/15/2018 1300 Last data filed at 11/14/2018 2200 Gross per 24 hour  Intake 237 ml  Output -  Net 237 ml   Filed Weights   11/13/18 0500 11/13/18 0525 11/15/18 0500  Weight: 93.4 kg 93.4 kg 94.8 kg    Examination: General exam: Appears calm and comfortable  Respiratory system: Clear to auscultation. Respiratory effort normal. Cardiovascular system: S1 & S2 heard, RRR. No  pedal edema. Gastrointestinal system: Abdomen is nondistended, soft and nontender. Normal bowel sounds heard. Central nervous system: Alert and oriented. Non focal exam. Speech clear  Extremities: Symmetric in appearance bilaterally  Skin: No rashes, lesions or ulcers on exposed skin  Psychiatry: Judgement and insight appear stable but poor   Data Reviewed: I have personally reviewed following labs and imaging studies  CBC: Recent Labs  Lab 11/11/18 0542 11/13/18 0213  WBC  --  10.0  HGB 13.5 14.0  HCT 40.9 41.7  MCV  --  91.2  PLT  --  [id number]   Basic Metabolic Panel: Recent Labs  Lab 11/09/18 0644 11/10/18 0334 11/11/18 0542 11/13/18 0213 11/14/18 0934  NA 134* 136 139 139 140  K 3.1* 3.6 3.4* 3.0* 3.8  CL 101 101 103 102 105  CO2 21* 24 25 24 24   GLUCOSE 169* 169* 157* 155* 154*  BUN 5* 10 13 15 13   CREATININE 0.56 0.52 0.58 0.60 0.55  CALCIUM 8.6* 9.1 9.3 9.4 9.5  MG 1.7 2.2 2.0 2.0  --    GFR: CrCl cannot be calculated (Unknown ideal weight.). Liver Function Tests: No results for input(s): AST, ALT, ALKPHOS, BILITOT, PROT, ALBUMIN in the last 168 hours. No results for input(s): LIPASE, AMYLASE in the last 168 hours. No results for input(s): AMMONIA in the last 168 hours. Coagulation Profile: No results for input(s): INR, PROTIME in the last 168 hours. Cardiac Enzymes: No results for input(s): CKTOTAL, CKMB, CKMBINDEX, TROPONINI in the last 168 hours. BNP (last 3 results) No results for input(s): PROBNP in the last 8760 hours. HbA1C: No results for input(s): HGBA1C in the last 72 hours. CBG: Recent Labs  Lab 11/14/18 1125 11/14/18 1618 11/14/18 2144 11/15/18 0648 11/15/18 0835  GLUCAP 167* 151* 200* 198* 225*   Lipid Profile: No results for input(s): CHOL, HDL, LDLCALC, TRIG, CHOLHDL, LDLDIRECT in the last 72 hours. Thyroid Function Tests: No results for input(s): TSH, T4TOTAL, FREET4, T3FREE, THYROIDAB in the last 72 hours. Anemia Panel: No results  for input(s): VITAMINB12, FOLATE, FERRITIN, TIBC, IRON, RETICCTPCT in the last 72 hours. Sepsis Labs: No results for input(s): PROCALCITON, LATICACIDVEN in the last 168 hours.  No results found for this or any previous visit (from the past 240 hour(s)).    Radiology Studies: No results found.    Scheduled Meds: . amLODipine  10 mg Oral Daily  . aspirin EC  81 mg Oral Daily  . atorvastatin  80 mg Oral q1800  . clopidogrel  75 mg Oral Daily  . enoxaparin (LOVENOX) injection  40 mg Subcutaneous Q24H  . feeding supplement  1 Container Oral TID BM  . feeding supplement (PRO-STAT SUGAR FREE 64)  30 mL Oral TID  . insulin aspart  0-15 Units Subcutaneous TID WC  . insulin aspart  0-5 Units Subcutaneous QHS  . linagliptin  5 mg Oral Daily  . lisinopril  20 mg Oral Daily  . pantoprazole  40 mg Oral Daily  . sertraline  50 mg Oral Daily  . sodium chloride flush  10-40 mL Intracatheter Q12H  . thiamine  100 mg Oral Daily   Continuous Infusions: . sodium chloride 100 mL/hr at 11/15/18 0540     LOS: 12 days      Time spent: 20 minutes   Dessa Phi, DO [facility] 11/15/2018, 1:00 PM   Available via Epic secure chat 7am-7pm After these hours, please refer to coverage provider listed on [email or web address]

## 2018-11-15 NOTE — Progress Notes (Signed)
  Speech Language Pathology Treatment: Cognitive-Linquistic  Patient Details Name: Becky Stout MRN: [id number] DOB: [birth year] Today's Date: 11/15/2018 Time: 0820-0840 SLP Time Calculation (min) (ACUTE ONLY): 20 min  Assessment / Plan / Recommendation Clinical Impression  Skilled treatment session focused on cognition goals. SLP recieved pt asleep but pt easily aroused and willing to participate in [place] session. SLP facilitated session by administering [place] to identify current deficits and assure goals and information are current. Pt presents with moderate cognitive impairments. Pt's impairments are reflective of infarcts in both hemispheres as pt is able to demonstrate selective attention, perform mental math problems but she isn't oriented and has deficits in retrieval of information. Pt presents with varying processing times. Questions related to herself reveal moderate confusion. For example, when asked "what was the highest level of school that you completed?" pt stated "I don't know" with very labile intonation. However when asked "Did you graduate high school, Did you graduate college?" pt able to answer with yes/no answers. Pt's speech is moderately tangential. Overall pt obtained a score of 11 out of 22 (n=> 18).   Skilled nursing facility continues to be appropriate level of support for pt.    HPI HPI: 63 y.o. female with Past medical history of B12 deficiency, type II DM not on therapy, HTN, IDA, obesity, WPW syndrome. Patient was brought in secondary to increasing confusion as well as failure to thrive and a fall.  MRI revealed scattered small acute infarcts in both cerebral hemispheres and brainstem and chronic basal ganglia infarcts and moderately advanced cerebral atrophy.      SLP Plan  Continue with current plan of care       Recommendations   SNF                Oral Care Recommendations: Oral care BID Follow up Recommendations: Skilled Nursing facility;24 hour  supervision/assistance SLP Visit Diagnosis: Cognitive communication deficit [id number]) Plan: Continue with current plan of care       GO                Becky Stout 11/15/2018, 9:01 AM

## 2018-11-15 NOTE — Progress Notes (Signed)
Patient is refusing to eat or drink, spit out lipitor. Able to crush and give in applesauce. MD notified. Will continue to encourage PO intake and educate on need for nutrition in order to get better.

## 2018-11-16 LAB — GLUCOSE, CAPILLARY
Glucose-Capillary: 175 mg/dL — ABNORMAL HIGH (ref 70–99)
Glucose-Capillary: 228 mg/dL — ABNORMAL HIGH (ref 70–99)
Glucose-Capillary: 181 mg/dL — ABNORMAL HIGH (ref 70–99)

## 2018-11-16 NOTE — Progress Notes (Signed)
PROGRESS NOTE    Becky Stout  [id number] DOB: 1955-12-06 DOA: 11/03/2018 PCP: Abner Greenspan, MD     Brief Narrative:  Becky Stout is a 63 year old female with history of DM-2, HTN, IDA, obesity and WPW syndrome presenting with increased confusion, fall and failure to thrive.  At baseline, patient lives alone.  Family brings food to her house on regular basis. She was admitted for DKA, acute CVA, metabolic encephalopathy and unwitnessed fall.   New events last 24 hours / Subjective: States that she had dinner last night, planning to eat lunch and dinner today.  However, per nursing notes, patient has not really eaten much as she reports.  No physical complaints today.  Assessment & Plan:   Principal Problem:   Failure to thrive in adult Active Problems:   Hypothyroidism   B12 deficiency   WOLFF (WOLFE)-PARKINSON-WHITE (WPW) SYNDROME   Essential hypertension   Hyperlipidemia   Poorly controlled type 2 diabetes mellitus (HCC)   Anxiety   Acute lower UTI   High anion gap metabolic acidosis   DKA (diabetic ketoacidoses) (HCC)   Cerebral thrombosis with cerebral infarction    Acute CVA -MRI brain on 11/03/18 with multiple cerebral and brainstem lacunar infarcts -ASA 81 mg and Plavix for 3 weeks starting 10/12 followed by Plavix alone per neuro. -Continue statin. -No need for TEE or loop recorder per neurology. -PT/OT rec SNF placement pending. Patient has no insurance.  Acute metabolic encephalopathy -Confused despite fair orientation. No improvement with high-dose thiamine 10/14-10/16.  Might have some baseline cognitive impairment.  -Frequent orientation delirium precautions -Continue thiamine 100 mg daily.  Hypertensive emergency: resolved.   -Added amlodipine 10 mg daily on 10/15 -Increased lisinopril to 20 mg daily on 10/16 -As needed labetalol for SBP greater than 170  DKA/poorly controlled diabetes with hyperglycemia -DKA resolved.  A1c 7.9%.  Not on  medication at home.  CBG slightly elevated. -Tradjenta added this hospitalization -Continue SSI -Consider adding Metformin on discharge.  Hypothyroidism -TSH within normal -Continue Synthroid  Pyuria -No UTI symptoms per sister at bedside.  Urine cultures negative x3. -Completed 3 days of Rocephin.  Intractable nausea/vomiting -Resolved  History of WPW syndrome -Normal EKG here. -Avoid nodal blocking agents  GAD/agoraphobia -Appreciate psych input. Started on Zoloft   Failure to thrive/poor p.o. intake -Appreciate dietitian input -Supplements, multivitamin -Her weight has not dropped despite patient's lack of appetite and oral intake    DVT prophylaxis: Lovenox Code Status: DNR Family Communication: None at bedside Disposition Plan: Pending SNF placement    Consultants:   Neurology   Psychiatry   Procedures:   None   Antimicrobials:  Anti-infectives (From admission, onward)   Start     Dose/Rate Route Frequency Ordered Stop   11/03/18 1930  cefTRIAXone (ROCEPHIN) 1 g in sodium chloride 0.9 % 100 mL IVPB  Status:  Discontinued     1 g 200 mL/hr over 30 Minutes Intravenous Every 24 hours 11/03/18 1724 11/06/18 1118   11/03/18 0530  cefTRIAXone (ROCEPHIN) 1 g in sodium chloride 0.9 % 100 mL IVPB     1 g 200 mL/hr over 30 Minutes Intravenous  Once 11/03/18 0525 11/03/18 0629       Objective: Vitals:   11/16/18 0200 11/16/18 0300 11/16/18 0332 11/16/18 0802  BP:   (!) 145/63 (!) 154/71  Pulse:   86 80  Resp: 16 16 16 16   Temp:   98.4 F (36.9 C) 97.7 F (36.5 C)  TempSrc:  Oral Oral  SpO2:   95% 96%  Weight:       No intake or output data in the 24 hours ending 11/16/18 1106 Filed Weights   11/13/18 0500 11/13/18 0525 11/15/18 0500  Weight: 93.4 kg 93.4 kg 94.8 kg    Examination: General exam: Appears calm and comfortable  Respiratory system: Clear to auscultation. Respiratory effort normal. Cardiovascular system: S1 & S2 heard, RRR.  No pedal edema. Gastrointestinal system: Abdomen is nondistended, soft and nontender. Normal bowel sounds heard. Central nervous system: Alert and oriented. Non focal exam. Speech clear  Extremities: Symmetric in appearance bilaterally  Skin: No rashes, lesions or ulcers on exposed skin  Psychiatry: Judgement and insight appear stable. Mood & affect appropriate.     Data Reviewed: I have personally reviewed following labs and imaging studies  CBC: Recent Labs  Lab 11/11/18 0542 11/13/18 0213  WBC  --  10.0  HGB 13.5 14.0  HCT 40.9 41.7  MCV  --  91.2  PLT  --  [id number]   Basic Metabolic Panel: Recent Labs  Lab 11/10/18 0334 11/11/18 0542 11/13/18 0213 11/14/18 0934  NA 136 139 139 140  K 3.6 3.4* 3.0* 3.8  CL 101 103 102 105  CO2 24 25 24 24   GLUCOSE 169* 157* 155* 154*  BUN 10 13 15 13   CREATININE 0.52 0.58 0.60 0.55  CALCIUM 9.1 9.3 9.4 9.5  MG 2.2 2.0 2.0  --    GFR: CrCl cannot be calculated (Unknown ideal weight.). Liver Function Tests: No results for input(s): AST, ALT, ALKPHOS, BILITOT, PROT, ALBUMIN in the last 168 hours. No results for input(s): LIPASE, AMYLASE in the last 168 hours. No results for input(s): AMMONIA in the last 168 hours. Coagulation Profile: No results for input(s): INR, PROTIME in the last 168 hours. Cardiac Enzymes: No results for input(s): CKTOTAL, CKMB, CKMBINDEX, TROPONINI in the last 168 hours. BNP (last 3 results) No results for input(s): PROBNP in the last 8760 hours. HbA1C: No results for input(s): HGBA1C in the last 72 hours. CBG: Recent Labs  Lab 11/15/18 0648 11/15/18 0835 11/15/18 1156 11/15/18 1649 11/15/18 2113  GLUCAP 198* 225* 232* 105* 210*   Lipid Profile: No results for input(s): CHOL, HDL, LDLCALC, TRIG, CHOLHDL, LDLDIRECT in the last 72 hours. Thyroid Function Tests: No results for input(s): TSH, T4TOTAL, FREET4, T3FREE, THYROIDAB in the last 72 hours. Anemia Panel: No results for input(s): VITAMINB12,  FOLATE, FERRITIN, TIBC, IRON, RETICCTPCT in the last 72 hours. Sepsis Labs: No results for input(s): PROCALCITON, LATICACIDVEN in the last 168 hours.  No results found for this or any previous visit (from the past 240 hour(s)).    Radiology Studies: No results found.    Scheduled Meds: . amLODipine  10 mg Oral Daily  . aspirin EC  81 mg Oral Daily  . atorvastatin  80 mg Oral q1800  . clopidogrel  75 mg Oral Daily  . enoxaparin (LOVENOX) injection  40 mg Subcutaneous Q24H  . feeding supplement  1 Container Oral TID BM  . feeding supplement (PRO-STAT SUGAR FREE 64)  30 mL Oral TID  . insulin aspart  0-15 Units Subcutaneous TID WC  . insulin aspart  0-5 Units Subcutaneous QHS  . linagliptin  5 mg Oral Daily  . lisinopril  20 mg Oral Daily  . pantoprazole  40 mg Oral Daily  . sertraline  50 mg Oral Daily  . sodium chloride flush  10-40 mL Intracatheter Q12H  . thiamine  100  mg Oral Daily   Continuous Infusions:    LOS: 13 days      Time spent: 20 minutes   Dessa Phi, DO [facility] 11/16/2018, 11:06 AM   Available via Epic secure chat 7am-7pm After these hours, please refer to coverage provider listed on [email or web address]

## 2018-11-16 NOTE — Progress Notes (Signed)
Occupational Therapy Treatment Patient Details Name: Becky Stout MRN: [id number] DOB: 10-Oct-1955 Today's Date: 11/16/2018    History of present illness 63 y.o. female with Past medical history of B12 deficiency, type II DM not on therapy, HTN, IDA, obesity, WPW syndrome.Pt dx with acute CVA, MRI reveals multiple cerebral and brainstem lacunar infarcts, acute metabolic encepholopathy, Hypertensive emergency, DKA, agorophobia, N/V, hypokalemia, and FTT.     OT comments  Upon entering the room, pt supine in high low bed. Pt with no c/o pain this session and oriented to location, self,and time. Pt was unaware of current condition and why she is hospitalized. Pt feels that if someone would just take her home and put her in bed she could do everything on her own. OT attempting for 15 minutes to get pt to roll L <> R or to attempt to get EOB with max multimodal cuing for sequencing and therapeutic use of self secondary to pt's anxiety related to falling. Pt repeated each time, " I don't understand why I have to do this." OT attempting to educate pt on importance of moving in order to progress and get home. Pt declines all mobility and self care at this time. She refused to allow therapist to elevated bed off of floor or to drop down bed rail.  Follow Up Recommendations  SNF    Equipment Recommendations  Other (comment)(defer to nexy venue of care)       Precautions / Restrictions Precautions Precautions: Fall Precaution Comments: very fearful of falling       Mobility Bed Mobility  General bed mobility comments: Pt refusal after 15 minutes of attempting to get her to do any type of mobility         ADL either performed or assessed with clinical judgement   ADL      General ADL Comments: Pt refusing all functional mobility tasks     Vision Baseline Vision/History: Wears glasses Wears Glasses: At all times Patient Visual Report: No change from baseline            Cognition  Arousal/Alertness: Awake/alert Behavior During Therapy: Anxious Overall Cognitive Status: Impaired/Different from baseline Area of Impairment: Attention;Memory;Following commands;Safety/judgement;Awareness;Problem solving      Orientation Level: Situation Current Attention Level: Selective Memory: Decreased recall of precautions;Decreased short-term memory Following Commands: Follows one step commands inconsistently;Follows one step commands with increased time Safety/Judgement: Decreased awareness of safety;Decreased awareness of deficits Awareness: Intellectual Problem Solving: Decreased initiation;Difficulty sequencing;Requires verbal cues;Requires tactile cues                     Pertinent Vitals/ Pain       Pain Assessment: Faces Faces Pain Scale: No hurt         Frequency  Min 2X/week        Progress Toward Goals  OT Goals(current goals can now be found in the care plan section)  Progress towards OT goals: Progressing toward goals  Acute Rehab OT Goals Patient Stated Goal: "to stay in bed" OT Goal Formulation: With patient Time For Goal Achievement: 11/30/18 Potential to Achieve Goals: Good  Plan Discharge plan remains appropriate       AM-PAC OT "6 Clicks" Daily Activity     Outcome Measure   Help from another person eating meals?: A Morina Help from another person taking care of personal grooming?: A Trillo Help from another person toileting, which includes using toliet, bedpan, or urinal?: A Seese Help from another person bathing (including washing,  rinsing, drying)?: A Whitesel Help from another person to put on and taking off regular upper body clothing?: A Dolle Help from another person to put on and taking off regular lower body clothing?: A Canavan 6 Click Score: 18    End of Session    OT Visit Diagnosis: Unsteadiness on feet (R26.81);Other abnormalities of gait and mobility (R26.89);Muscle weakness (generalized) (M62.81);History of falling  (Z91.81);Other symptoms and signs involving cognitive function;Adult, failure to thrive (R62.7)   Activity Tolerance Patient tolerated treatment well;Other (comment)(limited by cognition and anxiety)   Patient Left in bed;with call bell/phone within reach;Other (comment)   Nurse Communication Mobility status        Time: [id number] OT Time Calculation (min): 20 min  Charges: OT General Charges $OT Visit: 1 Visit OT Treatments $Therapeutic Activity: 8-22 mins   Gypsy Decant MS, OTR/L 11/16/2018, 12:38 PM

## 2018-11-16 NOTE — Progress Notes (Signed)
Physical Therapy Treatment Patient Details Name: Becky Stout Stout MRN: [id number] DOB: Jan 10, 1956 Today's Date: 11/16/2018    History of Present Illness 63 y.o. female with Past medical history of B12 deficiency, type II DM not on therapy, HTN, IDA, obesity, WPW syndrome.Pt dx with acute CVA, MRI reveals multiple cerebral and brainstem lacunar infarcts, acute metabolic encepholopathy, Hypertensive emergency, DKA, agorophobia, N/V, hypokalemia, and FTT.      PT Comments    Pt was able to sit EOB with PT and stand with hand held assist, but too weak to transfer to chair with only one person assist.  Pt sat EOB for ~15 mins before fatigue.  She was able to assist in ADL of combing her hair.  PT will continue to follow acutely for safe mobility progression.  Try standing with the steady next session.    Follow Up Recommendations  SNF     Equipment Recommendations  Wheelchair cushion (measurements PT);Hospital bed;Rolling walker with 5" wheels;3in1 (PT);Wheelchair (measurements PT)    Recommendations for Other Services   NA     Precautions / Restrictions Precautions Precautions: Fall Precaution Comments: very fearful of falling    Mobility  Bed Mobility Overal bed mobility: Needs Assistance Bed Mobility: Supine to Sit;Sit to Supine     Supine to sit: Supervision Sit to supine: Supervision   General bed mobility comments: supervision for safety, significant extra time needed for initiation, verbal explaination and re-explaination and sequencing.    Transfers Overall transfer level: Needs assistance Equipment used: 1 person hand held assist Transfers: Sit to/from Stand Sit to Stand: Mod assist         General transfer comment: Mod hand held assist to stand EOB over weak and shaking legs.  Pt holding to the bed rail as well, but not strong enough to pivot to the chair.    Modified Rankin (Stroke Patients Only) Modified Rankin (Stroke Patients Only) Pre-Morbid Rankin Score:  Moderate disability Modified Rankin: Severe disability     Balance Overall balance assessment: Needs assistance Sitting-balance support: Feet supported;Bilateral upper extremity supported;No upper extremity supported;Single extremity supported Sitting balance-Leahy Scale: Fair Sitting balance - Comments: close supervision EOB, fatigues quickly.  Pt able to attempt to comb her hair, but very tangled, so unable to do much.  PT attempted to untangle hair while pt sat EOB working on cognition and sitting tolerance.  After 15 mins, pt too fatigued and had to return to supine.                                     Cognition Arousal/Alertness: Awake/alert Behavior During Therapy: Anxious Overall Cognitive Status: Impaired/Different from baseline Area of Impairment: Attention;Memory;Following commands;Safety/judgement;Awareness;Problem solving                 Orientation Level: Situation;Person Current Attention Level: Selective Memory: Decreased recall of precautions;Decreased short-term memory Following Commands: Follows one step commands inconsistently;Follows one step commands with increased time Safety/Judgement: Decreased awareness of safety;Decreased awareness of deficits Awareness: Intellectual Problem Solving: Decreased initiation;Difficulty sequencing;Requires verbal cues;Requires tactile cues General Comments: Pt needs repeated (usually 5 or more) explainations about what we are going to do, how we are going to do it, and how it will be safe so that she doesn't fall.  Despite having seen her three times now, she does not remember working with me.  She cannot remember her sister's name  Pertinent Vitals/Pain Pain Assessment: No/denies pain Faces Pain Scale: No hurt           PT Goals (current goals can now be found in the care plan section) Acute Rehab PT Goals Patient Stated Goal: Pt agreeable to therapy Progress towards PT goals: Progressing  toward goals    Frequency    Min 3X/week      PT Plan Current plan remains appropriate       AM-PAC PT "6 Clicks" Mobility   Outcome Measure  Help needed turning from your back to your side while in a flat bed without using bedrails?: A Mcmahen Help needed moving from lying on your back to sitting on the side of a flat bed without using bedrails?: A Warshaw Help needed moving to and from a bed to a chair (including a wheelchair)?: Total Help needed standing up from a chair using your arms (e.g., wheelchair or bedside chair)?: Total Help needed to walk in hospital room?: Total Help needed climbing 3-5 steps with a railing? : Total 6 Click Score: 10    End of Session   Activity Tolerance: Patient limited by fatigue Patient left: in bed;with call bell/phone within reach;with bed alarm set   PT Visit Diagnosis: Other abnormalities of gait and mobility (R26.89)     Time: [id number] PT Time Calculation (min) (ACUTE ONLY): 42 min  Charges:  $Therapeutic Activity: 38-52 mins            Rigdon Macomber B. Gerry Blanchfield, PT, DPT  Acute Rehabilitation [phone] pager [phone] office  @ [name]: [phone]             11/16/2018, 3:11 PM

## 2018-11-17 LAB — GLUCOSE, CAPILLARY
Glucose-Capillary: 196 mg/dL — ABNORMAL HIGH (ref 70–99)
Glucose-Capillary: 232 mg/dL — ABNORMAL HIGH (ref 70–99)
Glucose-Capillary: 221 mg/dL — ABNORMAL HIGH (ref 70–99)
Glucose-Capillary: 368 mg/dL — ABNORMAL HIGH (ref 70–99)

## 2018-11-17 MED ORDER — HYDRALAZINE HCL 10 MG PO TABS: 10.0000 mg | ORAL_TABLET | Freq: Three times a day (TID) | ORAL | Status: DC | PRN

## 2018-11-17 MED ORDER — LISINOPRIL 20 MG PO TABS
40.0000 mg | ORAL_TABLET | Freq: Every day | ORAL | Status: DC
  Administered 2018-11-18 – 2019-02-02 (×76): 40 mg via ORAL
  Filled 2018-11-17 (×77): qty 2

## 2018-11-17 NOTE — Progress Notes (Signed)
PROGRESS NOTE    Becky Stout  [id number] DOB: 03/01/1955 DOA: 11/03/2018 PCP: Abner Greenspan, MD     Brief Narrative:  Becky Stout is a 63 year old female with history of DM-2, HTN, IDA, obesity and WPW syndrome presenting with increased confusion, fall and failure to thrive.  At baseline, patient lives alone.  Family brings food to her house on regular basis. She was admitted for DKA, acute CVA, metabolic encephalopathy and unwitnessed fall.   New events last 24 hours / Subjective: No acute issues  Assessment & Plan:   Principal Problem:   Failure to thrive in adult Active Problems:   Hypothyroidism   B12 deficiency   WOLFF (WOLFE)-PARKINSON-WHITE (WPW) SYNDROME   Essential hypertension   Hyperlipidemia   Poorly controlled type 2 diabetes mellitus (HCC)   Anxiety   Acute lower UTI   High anion gap metabolic acidosis   DKA (diabetic ketoacidoses) (HCC)   Cerebral thrombosis with cerebral infarction    Acute CVA -MRI brain on 11/03/18 with multiple cerebral and brainstem lacunar infarcts -ASA 81 mg and Plavix for 3 weeks starting 10/12 followed by Plavix alone per neuro. -Continue statin. -No need for TEE or loop recorder per neurology. -PT/OT rec SNF placement pending. Patient has no insurance.  Acute metabolic encephalopathy -Confused despite fair orientation. No improvement with high-dose thiamine 10/14-10/16.  Might have some baseline cognitive impairment.  -Frequent orientation delirium precautions -Continue thiamine 100 mg daily.  Hypertensive emergency: resolved.   -Continue amlodipine, lisinopril  DKA/poorly controlled diabetes with hyperglycemia -DKA resolved.  A1c 7.9%.  Not on medication at home.  CBG slightly elevated. -Tradjenta added this hospitalization -Continue SSI   -Consider adding Metformin on discharge.  Hypothyroidism -TSH within normal -Continue Synthroid  Pyuria -No UTI symptoms per sister at bedside.  Urine cultures  negative x3. -Completed 3 days of Rocephin.  Intractable nausea/vomiting -Resolved  History of WPW syndrome -Normal EKG here.  GAD/agoraphobia -Appreciate psych input. Started on Zoloft   Failure to thrive/poor p.o. intake -Appreciate dietitian input -Supplements, multivitamin -Her weight has not dropped despite patient's lack of appetite and oral intake    DVT prophylaxis: Lovenox Code Status: DNR Family Communication: None at bedside Disposition Plan: Pending SNF placement    Consultants:   Neurology   Psychiatry   Procedures:   None   Antimicrobials:  Anti-infectives (From admission, onward)   Start     Dose/Rate Route Frequency Ordered Stop   11/03/18 1930  cefTRIAXone (ROCEPHIN) 1 g in sodium chloride 0.9 % 100 mL IVPB  Status:  Discontinued     1 g 200 mL/hr over 30 Minutes Intravenous Every 24 hours 11/03/18 1724 11/06/18 1118   11/03/18 0530  cefTRIAXone (ROCEPHIN) 1 g in sodium chloride 0.9 % 100 mL IVPB     1 g 200 mL/hr over 30 Minutes Intravenous  Once 11/03/18 0525 11/03/18 0629       Objective: Vitals:   11/16/18 2345 11/17/18 0255 11/17/18 0259 11/17/18 0809  BP: (!) 146/57  (!) 155/65 (!) 149/72  Pulse: 80  83 81  Resp: 18  18 20   Temp: 98.2 F (36.8 C)  98.3 F (36.8 C) 98 F (36.7 C)  TempSrc: Oral  Oral Oral  SpO2: 92%  90% 96%  Weight:  95 kg      Intake/Output Summary (Last 24 hours) at 11/17/2018 1031 Last data filed at 11/16/2018 1841 Gross per 24 hour  Intake 480 ml  Output 350 ml  Net 130 ml  Filed Weights   11/13/18 0525 11/15/18 0500 11/17/18 0255  Weight: 93.4 kg 94.8 kg 95 kg    Examination: General exam: Appears calm and comfortable  Respiratory system: Clear to auscultation. Respiratory effort normal. Cardiovascular system: S1 & S2 heard, RRR. No pedal edema. Gastrointestinal system: Abdomen is nondistended, soft and nontender. Normal bowel sounds heard. Central nervous system: Alert and oriented. Non  focal exam. Speech clear  Extremities: Symmetric in appearance bilaterally  Skin: No rashes, lesions or ulcers on exposed skin  Psychiatry: Judgement and insight appear stable. Mood & affect appropriate.    Data Reviewed: I have personally reviewed following labs and imaging studies  CBC: Recent Labs  Lab 11/11/18 0542 11/13/18 0213  WBC  --  10.0  HGB 13.5 14.0  HCT 40.9 41.7  MCV  --  91.2  PLT  --  [id number]   Basic Metabolic Panel: Recent Labs  Lab 11/11/18 0542 11/13/18 0213 11/14/18 0934  NA 139 139 140  K 3.4* 3.0* 3.8  CL 103 102 105  CO2 25 24 24   GLUCOSE 157* 155* 154*  BUN 13 15 13   CREATININE 0.58 0.60 0.55  CALCIUM 9.3 9.4 9.5  MG 2.0 2.0  --    GFR: CrCl cannot be calculated (Unknown ideal weight.). Liver Function Tests: No results for input(s): AST, ALT, ALKPHOS, BILITOT, PROT, ALBUMIN in the last 168 hours. No results for input(s): LIPASE, AMYLASE in the last 168 hours. No results for input(s): AMMONIA in the last 168 hours. Coagulation Profile: No results for input(s): INR, PROTIME in the last 168 hours. Cardiac Enzymes: No results for input(s): CKTOTAL, CKMB, CKMBINDEX, TROPONINI in the last 168 hours. BNP (last 3 results) No results for input(s): PROBNP in the last 8760 hours. HbA1C: No results for input(s): HGBA1C in the last 72 hours. CBG: Recent Labs  Lab 11/15/18 2113 11/16/18 1123 11/16/18 1621 11/16/18 2058 11/17/18 0635  GLUCAP 210* 175* 228* 181* 196*   Lipid Profile: No results for input(s): CHOL, HDL, LDLCALC, TRIG, CHOLHDL, LDLDIRECT in the last 72 hours. Thyroid Function Tests: No results for input(s): TSH, T4TOTAL, FREET4, T3FREE, THYROIDAB in the last 72 hours. Anemia Panel: No results for input(s): VITAMINB12, FOLATE, FERRITIN, TIBC, IRON, RETICCTPCT in the last 72 hours. Sepsis Labs: No results for input(s): PROCALCITON, LATICACIDVEN in the last 168 hours.  No results found for this or any previous visit (from the past  240 hour(s)).    Radiology Studies: No results found.    Scheduled Meds: . amLODipine  10 mg Oral Daily  . aspirin EC  81 mg Oral Daily  . atorvastatin  80 mg Oral q1800  . clopidogrel  75 mg Oral Daily  . enoxaparin (LOVENOX) injection  40 mg Subcutaneous Q24H  . feeding supplement  1 Container Oral TID BM  . feeding supplement (PRO-STAT SUGAR FREE 64)  30 mL Oral TID  . insulin aspart  0-15 Units Subcutaneous TID WC  . insulin aspart  0-5 Units Subcutaneous QHS  . linagliptin  5 mg Oral Daily  . lisinopril  20 mg Oral Daily  . pantoprazole  40 mg Oral Daily  . sertraline  50 mg Oral Daily  . sodium chloride flush  10-40 mL Intracatheter Q12H  . thiamine  100 mg Oral Daily   Continuous Infusions:    LOS: 14 days      Time spent: 20 minutes   Dessa Phi, DO [facility] 11/17/2018, 10:31 AM   Available via Epic secure chat 7am-7pm  After these hours, please refer to coverage provider listed on [email or web address]

## 2018-11-18 LAB — GLUCOSE, CAPILLARY
Glucose-Capillary: 231 mg/dL — ABNORMAL HIGH (ref 70–99)
Glucose-Capillary: 183 mg/dL — ABNORMAL HIGH (ref 70–99)
Glucose-Capillary: 207 mg/dL — ABNORMAL HIGH (ref 70–99)
Glucose-Capillary: 294 mg/dL — ABNORMAL HIGH (ref 70–99)

## 2018-11-18 NOTE — Progress Notes (Signed)
PROGRESS NOTE    Becky Stout  [id number] DOB: 1955-09-15 DOA: 11/03/2018 PCP: Abner Greenspan, MD     Brief Narrative:  Becky Stout is a 63 year old female with history of DM-2, HTN, IDA, obesity and WPW syndrome presenting with increased confusion, fall and failure to thrive.  At baseline, patient lives alone.  Family brings food to her house on regular basis. She was admitted for DKA, acute CVA, metabolic encephalopathy and unwitnessed fall.   New events last 24 hours / Subjective: No new complaints today  Assessment & Plan:   Principal Problem:   Failure to thrive in adult Active Problems:   Hypothyroidism   B12 deficiency   WOLFF (WOLFE)-PARKINSON-WHITE (WPW) SYNDROME   Essential hypertension   Hyperlipidemia   Poorly controlled type 2 diabetes mellitus (HCC)   Anxiety   Acute lower UTI   High anion gap metabolic acidosis   DKA (diabetic ketoacidoses) (HCC)   Cerebral thrombosis with cerebral infarction    Acute CVA -MRI brain on 11/03/18 with multiple cerebral and brainstem lacunar infarcts -ASA 81 mg and Plavix for 3 weeks starting 10/12 followed by Plavix alone per neuro. -Continue statin. -No need for TEE or loop recorder per neurology. -PT/OT rec SNF placement pending. Patient has no insurance.  Acute metabolic encephalopathy -Confused despite fair orientation. No improvement with high-dose thiamine 10/14-10/16.  Might have some baseline cognitive impairment.  -Frequent orientation delirium precautions -Continue thiamine 100 mg daily.  Hypertensive emergency: resolved.   -Continue amlodipine, lisinopril  DKA/poorly controlled diabetes with hyperglycemia -DKA resolved.  A1c 7.9%.  Not on medication at home.  CBG slightly elevated. -Tradjenta added this hospitalization -Continue SSI   -Consider adding Metformin on discharge.  Hypothyroidism -TSH within normal -Continue Synthroid  Pyuria -No UTI symptoms per sister at bedside.  Urine  cultures negative x3. -Completed 3 days of Rocephin.  Intractable nausea/vomiting -Resolved  History of WPW syndrome -Normal EKG  GAD/agoraphobia -Appreciate psych input. Started on Zoloft   Failure to thrive/poor p.o. intake -Appreciate dietitian input -Supplements, multivitamin -Her weight has not dropped despite patient's lack of appetite and oral intake    DVT prophylaxis: Lovenox Code Status: DNR Family Communication: None at bedside Disposition Plan: Pending SNF placement    Consultants:   Neurology   Psychiatry   Procedures:   None   Antimicrobials:  Anti-infectives (From admission, onward)   Start     Dose/Rate Route Frequency Ordered Stop   11/03/18 1930  cefTRIAXone (ROCEPHIN) 1 g in sodium chloride 0.9 % 100 mL IVPB  Status:  Discontinued     1 g 200 mL/hr over 30 Minutes Intravenous Every 24 hours 11/03/18 1724 11/06/18 1118   11/03/18 0530  cefTRIAXone (ROCEPHIN) 1 g in sodium chloride 0.9 % 100 mL IVPB     1 g 200 mL/hr over 30 Minutes Intravenous  Once 11/03/18 0525 11/03/18 0629       Objective: Vitals:   11/17/18 2343 11/18/18 0210 11/18/18 0423 11/18/18 0759  BP: 132/63  (!) 144/65 (!) 143/64  Pulse: 80  72 83  Resp: 16  16 16   Temp: 98.3 F (36.8 C)  98.1 F (36.7 C) 98.4 F (36.9 C)  TempSrc: Oral  Oral Oral  SpO2: 94%  91% 94%  Weight:  95 kg      Intake/Output Summary (Last 24 hours) at 11/18/2018 1104 Last data filed at 11/18/2018 0400 Gross per 24 hour  Intake -  Output 450 ml  Net -450 ml  Filed Weights   11/15/18 0500 11/17/18 0255 11/18/18 0210  Weight: 94.8 kg 95 kg 95 kg    Examination: General exam: Appears calm and comfortable  Psychiatry: Judgement and insight appear stable. Mood & affect appropriate.    Data Reviewed: I have personally reviewed following labs and imaging studies  CBC: Recent Labs  Lab 11/13/18 0213  WBC 10.0  HGB 14.0  HCT 41.7  MCV 91.2  PLT [id number]   Basic Metabolic Panel:  Recent Labs  Lab 11/13/18 0213 11/14/18 0934  NA 139 140  K 3.0* 3.8  CL 102 105  CO2 24 24  GLUCOSE 155* 154*  BUN 15 13  CREATININE 0.60 0.55  CALCIUM 9.4 9.5  MG 2.0  --    GFR: CrCl cannot be calculated (Unknown ideal weight.). Liver Function Tests: No results for input(s): AST, ALT, ALKPHOS, BILITOT, PROT, ALBUMIN in the last 168 hours. No results for input(s): LIPASE, AMYLASE in the last 168 hours. No results for input(s): AMMONIA in the last 168 hours. Coagulation Profile: No results for input(s): INR, PROTIME in the last 168 hours. Cardiac Enzymes: No results for input(s): CKTOTAL, CKMB, CKMBINDEX, TROPONINI in the last 168 hours. BNP (last 3 results) No results for input(s): PROBNP in the last 8760 hours. HbA1C: No results for input(s): HGBA1C in the last 72 hours. CBG: Recent Labs  Lab 11/17/18 0635 11/17/18 1333 11/17/18 1612 11/17/18 2125 11/18/18 0617  GLUCAP 196* 232* 221* 368* 231*   Lipid Profile: No results for input(s): CHOL, HDL, LDLCALC, TRIG, CHOLHDL, LDLDIRECT in the last 72 hours. Thyroid Function Tests: No results for input(s): TSH, T4TOTAL, FREET4, T3FREE, THYROIDAB in the last 72 hours. Anemia Panel: No results for input(s): VITAMINB12, FOLATE, FERRITIN, TIBC, IRON, RETICCTPCT in the last 72 hours. Sepsis Labs: No results for input(s): PROCALCITON, LATICACIDVEN in the last 168 hours.  No results found for this or any previous visit (from the past 240 hour(s)).    Radiology Studies: No results found.    Scheduled Meds: . amLODipine  10 mg Oral Daily  . aspirin EC  81 mg Oral Daily  . atorvastatin  80 mg Oral q1800  . clopidogrel  75 mg Oral Daily  . enoxaparin (LOVENOX) injection  40 mg Subcutaneous Q24H  . feeding supplement  1 Container Oral TID BM  . feeding supplement (PRO-STAT SUGAR FREE 64)  30 mL Oral TID  . insulin aspart  0-15 Units Subcutaneous TID WC  . insulin aspart  0-5 Units Subcutaneous QHS  . linagliptin  5 mg  Oral Daily  . lisinopril  40 mg Oral Daily  . pantoprazole  40 mg Oral Daily  . sertraline  50 mg Oral Daily  . sodium chloride flush  10-40 mL Intracatheter Q12H  . thiamine  100 mg Oral Daily   Continuous Infusions:    LOS: 15 days      Time spent: 10 minutes   Dessa Phi, DO [facility] 11/18/2018, 11:04 AM   Available via Epic secure chat 7am-7pm After these hours, please refer to coverage provider listed on [email or web address]

## 2018-11-19 LAB — GLUCOSE, CAPILLARY
Glucose-Capillary: 199 mg/dL — ABNORMAL HIGH (ref 70–99)
Glucose-Capillary: 279 mg/dL — ABNORMAL HIGH (ref 70–99)
Glucose-Capillary: 138 mg/dL — ABNORMAL HIGH (ref 70–99)

## 2018-11-19 NOTE — Progress Notes (Addendum)
Nutrition Follow-up  DOCUMENTATION CODES:   Not applicable  INTERVENTION:  Continue 30 ml Prostat po TID, each supplement provides 100 kcal and 15 grams of protein.   Continue Boost Breeze po TID, each supplement provides 250 kcal and 9 grams of protein  Encourage adequate PO intake.   NUTRITION DIAGNOSIS:   Inadequate oral intake related to poor appetite as evidenced by per patient/family report; improving  GOAL:   Patient will meet greater than or equal to 90% of their needs; met  MONITOR:   PO intake, Supplement acceptance, Skin, Weight trends, Labs, I & O's  REASON FOR ASSESSMENT:   Consult Assessment of nutrition requirement/status  ASSESSMENT:   63 y.o. female with Past medical history of B12 deficiency, type II DM not on therapy, HTN, CHF, IDA, obesity, WPW syndrome.Patient was brought into secondary to increasing confusion as well as failure to thrive and a fall.62 y.o. female with Past medical history of B12 deficiency, type II DM not on therapy, HTN, IDA, obesity, WPW syndrome.Patient was brought into secondary to increasing confusion as well as failure to thrive and a fall. Scattered small acute subcortical infarcts in both cerebral hemispheres and brainstem.  Meal completion has been 50%. Pt currently has nutritional supplements ordered and has been consuming them. RD to continue with current orders to aid in caloric and protein needs. Labs and medications reviewed.   Diet Order:   Diet Order            Diet Carb Modified Fluid consistency: Thin; Room service appropriate? Yes  Diet effective now              EDUCATION NEEDS:   Not appropriate for education at this time  Skin:  Skin Assessment: Reviewed RN Assessment  Last BM:  10/24  Height:   Ht Readings from Last 1 Encounters:  05/26/17 '5\' 4"'  (1.626 m)    Weight:   Wt Readings from Last 1 Encounters:  11/19/18 95 kg    Ideal Body Weight:  54.5 kg  BMI:  Body mass index is 35.95  kg/m.  Estimated Nutritional Needs:   Kcal:  1800-2000  Protein:  85-100 grams  Fluid:  >/= 1.8 L/day    Corrin Parker, MS, RD, LDN Pager # [phone] After hours/ weekend pager # [phone]

## 2018-11-19 NOTE — Progress Notes (Signed)
Physical Therapy Treatment Patient Details Name: Becky Stout MRN: [id number] DOB: December 14, 1955 Today's Date: 11/19/2018    History of Present Illness 63 y.o. female with Past medical history of B12 deficiency, type II DM not on therapy, HTN, IDA, obesity, WPW syndrome.Pt dx with acute CVA, MRI reveals multiple cerebral and brainstem lacunar infarcts, acute metabolic encepholopathy, Hypertensive emergency, DKA, agorophobia, N/V, hypokalemia, and FTT.      PT Comments    Pt continues to be extremely anxious and fearful of falling greatly limiting OOB mobility. Pt demonstrating improved bilat LE strength and completed bilat quad sets and knee to chest exercises x 10 reps in bed. Pt refused to let you physically help her because "you'll make me fall." Spent 20 min "trying to stand" using RW, 2 person assist and stedy, all unsuccessful. Pt scooted along EOB. Cont to recommend SNF upon d/c. Acute PT to cont to follow.    Follow Up Recommendations  SNF     Equipment Recommendations  Wheelchair cushion (measurements PT);Hospital bed;Rolling walker with 5" wheels;3in1 (PT);Wheelchair (measurements PT)    Recommendations for Other Services       Precautions / Restrictions Precautions Precautions: Fall Precaution Comments: extremely anxious, doesn't want to be touched and fearful of falling Restrictions Weight Bearing Restrictions: No    Mobility  Bed Mobility Overal bed mobility: Needs Assistance Bed Mobility: Supine to Sit;Sit to Supine     Supine to sit: Supervision Sit to supine: Supervision   General bed mobility comments: significant increased time and multiple multi modal cues and explanation why she needs to sit up"pt states but i like laying here" pt with no concept of the necessity for OOB mobility and movement. Pt then refused to let PT assist her or give her suggestions ie. using bed rail (i can't do that, it'll make me fall)  Transfers Overall transfer level: Needs  assistance Equipment used: 2 person hand held assist Transfers: Sit to/from Stand Sit to Stand: Mod assist;+2 physical assistance(attempted without success)         General transfer comment: attempted for 20 min, attempted with 2 person assist, attempted with RW and stedy, pt reufsed all of them and continues "tried" by herself, unable to successfully clear her bottom in the 20 min, ultimately pt statedd "I'm tired and laid back down on the bed"  Ambulation/Gait             General Gait Details: unable   Stairs             Wheelchair Mobility    Modified Rankin (Stroke Patients Only) Modified Rankin (Stroke Patients Only) Pre-Morbid Rankin Score: Moderate disability Modified Rankin: Severe disability     Balance Overall balance assessment: Needs assistance Sitting-balance support: Feet supported;Bilateral upper extremity supported;No upper extremity supported;Single extremity supported Sitting balance-Leahy Scale: Fair Sitting balance - Comments: close supervision EOB, fatigues quickly.  Pt able to attempt to comb her hair, but very tangled, so unable to do much.  PT attempted to untangle hair while pt sat EOB working on cognition and sitting tolerance.  After 15 mins, pt too fatigued and had to return to supine.    Standing balance support: Bilateral upper extremity supported Standing balance-Leahy Scale: Zero Standing balance comment: unable to physical get into full standing                            Cognition Arousal/Alertness: Awake/alert Behavior During Therapy: Anxious Overall Cognitive Status: Impaired/Different  from baseline Area of Impairment: Attention;Memory;Following commands;Safety/judgement;Awareness;Problem solving                 Orientation Level: Disoriented to;Situation(aware in hospital) Current Attention Level: Sustained Memory: Decreased recall of precautions;Decreased short-term memory Following Commands: Follows one  step commands inconsistently;Follows one step commands with increased time(to fearful to follow and poor understanding) Safety/Judgement: Decreased awareness of safety;Decreased awareness of deficits Awareness: Intellectual Problem Solving: Decreased initiation;Slow processing;Difficulty sequencing;Requires verbal cues;Requires tactile cues General Comments: Pt needs repeated (usually 5 or more) explainations about what we are going to do, how we are going to do it, and how it will be safe so that she doesn't fall.  Spent 20 min trying to complete sit to stand pvt to chair, pt kept saying "give me 1 min to get strong so I dont fall" cont refused help or use of steady.      Exercises      General Comments General comments (skin integrity, edema, etc.): VSS just extremely anxious and fearful      Pertinent Vitals/Pain Pain Assessment: No/denies pain    Home Living                      Prior Function            PT Goals (current goals can now be found in the care plan section) Progress towards PT goals: Progressing toward goals    Frequency    Min 3X/week      PT Plan Current plan remains appropriate    Co-evaluation              AM-PAC PT "6 Clicks" Mobility   Outcome Measure  Help needed turning from your back to your side while in a flat bed without using bedrails?: A Kozikowski Help needed moving from lying on your back to sitting on the side of a flat bed without using bedrails?: A Gotay Help needed moving to and from a bed to a chair (including a wheelchair)?: Total Help needed standing up from a chair using your arms (e.g., wheelchair or bedside chair)?: Total Help needed to walk in hospital room?: Total Help needed climbing 3-5 steps with a railing? : Total 6 Click Score: 10    End of Session Equipment Utilized During Treatment: Gait belt Activity Tolerance: Patient limited by fatigue(and anxiety) Patient left: in bed;with call bell/phone within  reach;with bed alarm set Nurse Communication: Mobility status PT Visit Diagnosis: Other abnormalities of gait and mobility (R26.89)     Time: [id number] PT Time Calculation (min) (ACUTE ONLY): 41 min  Charges:  $Gait Training: 8-22 mins $Therapeutic Activity: 23-37 mins                     Kittie Plater, PT, DPT Acute Rehabilitation Services Pager #: [phone] Office #: [phone]    Berline Lopes 11/19/2018, 2:24 PM

## 2018-11-19 NOTE — Progress Notes (Signed)
PROGRESS NOTE    Becky Stout  [id number] DOB: 23-Nov-1955 DOA: 11/03/2018 PCP: Abner Greenspan, MD     Brief Narrative:  Becky Stout is a 63 year old female with history of DM-2, HTN, IDA, obesity and WPW syndrome presenting with increased confusion, fall and failure to thrive.  At baseline, patient lives alone.  Family brings food to her house on regular basis. She was admitted for DKA, acute CVA, metabolic encephalopathy and unwitnessed fall.   New events last 24 hours / Subjective: No acute events overnight  Assessment & Plan:   Principal Problem:   Failure to thrive in adult Active Problems:   Hypothyroidism   B12 deficiency   WOLFF (WOLFE)-PARKINSON-WHITE (WPW) SYNDROME   Essential hypertension   Hyperlipidemia   Poorly controlled type 2 diabetes mellitus (HCC)   Anxiety   Acute lower UTI   High anion gap metabolic acidosis   DKA (diabetic ketoacidoses) (HCC)   Cerebral thrombosis with cerebral infarction    Acute CVA -MRI brain on 11/03/18 with multiple cerebral and brainstem lacunar infarcts -ASA 81 mg and Plavix for 3 weeks starting 10/12 followed by Plavix alone per neuro. -Continue statin. -No need for TEE or loop recorder per neurology. -PT/OT rec SNF placement pending. Patient has no insurance.  Acute metabolic encephalopathy -Confused despite fair orientation. No improvement with high-dose thiamine 10/14-10/16.  Might have some baseline cognitive impairment.  -Frequent orientation delirium precautions -Continue thiamine 100 mg daily.  Hypertensive emergency: resolved.   -Continue amlodipine, lisinopril  DKA/poorly controlled diabetes with hyperglycemia -DKA resolved.  A1c 7.9%.  Not on medication at home.  CBG slightly elevated. -Tradjenta added this hospitalization -Continue SSI   -Consider adding Metformin on discharge.  Hypothyroidism -TSH within normal -Continue Synthroid  Pyuria -No UTI symptoms per sister at bedside.  Urine  cultures negative x3. -Completed 3 days of Rocephin.  Intractable nausea/vomiting -Resolved  History of WPW syndrome -Normal EKG  GAD/agoraphobia -Appreciate psych input. Started on Zoloft   Failure to thrive/poor p.o. intake -Appreciate dietitian input -Supplements, multivitamin -Her weight has not dropped despite patient's lack of appetite and oral intake    DVT prophylaxis: Lovenox Code Status: DNR Family Communication: None at bedside Disposition Plan: Pending SNF placement    Consultants:   Neurology   Psychiatry   Procedures:   None   Antimicrobials:  Anti-infectives (From admission, onward)   Start     Dose/Rate Route Frequency Ordered Stop   11/03/18 1930  cefTRIAXone (ROCEPHIN) 1 g in sodium chloride 0.9 % 100 mL IVPB  Status:  Discontinued     1 g 200 mL/hr over 30 Minutes Intravenous Every 24 hours 11/03/18 1724 11/06/18 1118   11/03/18 0530  cefTRIAXone (ROCEPHIN) 1 g in sodium chloride 0.9 % 100 mL IVPB     1 g 200 mL/hr over 30 Minutes Intravenous  Once 11/03/18 0525 11/03/18 0629       Objective: Vitals:   11/18/18 2317 11/19/18 0312 11/19/18 0412 11/19/18 0801  BP: 131/71  140/67 (!) 143/67  Pulse: 71  79 75  Resp: 15  16 17   Temp: 97.9 F (36.6 C)  99.1 F (37.3 C) 98.5 F (36.9 C)  TempSrc: Oral  Oral Oral  SpO2: 95%  94% 96%  Weight:  95 kg     No intake or output data in the 24 hours ending 11/19/18 1115 Filed Weights   11/17/18 0255 11/18/18 0210 11/19/18 0312  Weight: 95 kg 95 kg 95 kg  Examination: General exam: Appears calm and comfortable    Data Reviewed: I have personally reviewed following labs and imaging studies  CBC: Recent Labs  Lab 11/13/18 0213  WBC 10.0  HGB 14.0  HCT 41.7  MCV 91.2  PLT [id number]   Basic Metabolic Panel: Recent Labs  Lab 11/13/18 0213 11/14/18 0934  NA 139 140  K 3.0* 3.8  CL 102 105  CO2 24 24  GLUCOSE 155* 154*  BUN 15 13  CREATININE 0.60 0.55  CALCIUM 9.4 9.5  MG  2.0  --    GFR: CrCl cannot be calculated (Unknown ideal weight.). Liver Function Tests: No results for input(s): AST, ALT, ALKPHOS, BILITOT, PROT, ALBUMIN in the last 168 hours. No results for input(s): LIPASE, AMYLASE in the last 168 hours. No results for input(s): AMMONIA in the last 168 hours. Coagulation Profile: No results for input(s): INR, PROTIME in the last 168 hours. Cardiac Enzymes: No results for input(s): CKTOTAL, CKMB, CKMBINDEX, TROPONINI in the last 168 hours. BNP (last 3 results) No results for input(s): PROBNP in the last 8760 hours. HbA1C: No results for input(s): HGBA1C in the last 72 hours. CBG: Recent Labs  Lab 11/18/18 0617 11/18/18 1129 11/18/18 1626 11/18/18 2111 11/19/18 0615  GLUCAP 231* 183* 207* 294* 199*   Lipid Profile: No results for input(s): CHOL, HDL, LDLCALC, TRIG, CHOLHDL, LDLDIRECT in the last 72 hours. Thyroid Function Tests: No results for input(s): TSH, T4TOTAL, FREET4, T3FREE, THYROIDAB in the last 72 hours. Anemia Panel: No results for input(s): VITAMINB12, FOLATE, FERRITIN, TIBC, IRON, RETICCTPCT in the last 72 hours. Sepsis Labs: No results for input(s): PROCALCITON, LATICACIDVEN in the last 168 hours.  No results found for this or any previous visit (from the past 240 hour(s)).    Radiology Studies: No results found.    Scheduled Meds: . amLODipine  10 mg Oral Daily  . aspirin EC  81 mg Oral Daily  . atorvastatin  80 mg Oral q1800  . clopidogrel  75 mg Oral Daily  . enoxaparin (LOVENOX) injection  40 mg Subcutaneous Q24H  . feeding supplement  1 Container Oral TID BM  . feeding supplement (PRO-STAT SUGAR FREE 64)  30 mL Oral TID  . insulin aspart  0-15 Units Subcutaneous TID WC  . insulin aspart  0-5 Units Subcutaneous QHS  . linagliptin  5 mg Oral Daily  . lisinopril  40 mg Oral Daily  . pantoprazole  40 mg Oral Daily  . sertraline  50 mg Oral Daily  . sodium chloride flush  10-40 mL Intracatheter Q12H  .  thiamine  100 mg Oral Daily   Continuous Infusions:    LOS: 16 days      Time spent: 10 minutes   Dessa Phi, DO [facility] 11/19/2018, 11:15 AM   Available via Epic secure chat 7am-7pm After these hours, please refer to coverage provider listed on [email or web address]

## 2018-11-20 LAB — GLUCOSE, CAPILLARY
Glucose-Capillary: 284 mg/dL — ABNORMAL HIGH (ref 70–99)
Glucose-Capillary: 156 mg/dL — ABNORMAL HIGH (ref 70–99)
Glucose-Capillary: 303 mg/dL — ABNORMAL HIGH (ref 70–99)

## 2018-11-20 NOTE — Progress Notes (Signed)
PROGRESS NOTE    Becky Stout  [id number] DOB: [birth year] DOA: 11/03/2018 PCP: Abner Greenspan, MD     Brief Narrative:  Becky Stout is a 63 year old female with history of DM-2, HTN, IDA, obesity and WPW syndrome presenting with increased confusion, fall and failure to thrive.  At baseline, patient lives alone.  Family brings food to her house on regular basis. She was admitted for DKA, acute CVA, metabolic encephalopathy and unwitnessed fall.   New events last 24 hours / Subjective: No new issues   Assessment & Plan:   Principal Problem:   Failure to thrive in adult Active Problems:   Hypothyroidism   B12 deficiency   WOLFF (WOLFE)-PARKINSON-WHITE (WPW) SYNDROME   Essential hypertension   Hyperlipidemia   Poorly controlled type 2 diabetes mellitus (HCC)   Anxiety   Acute lower UTI   High anion gap metabolic acidosis   DKA (diabetic ketoacidoses) (HCC)   Cerebral thrombosis with cerebral infarction    Acute CVA -MRI brain on 11/03/18 with multiple cerebral and brainstem lacunar infarcts -ASA 81 mg and Plavix for 3 weeks starting 10/12 followed by Plavix alone per neuro. -Continue statin. -No need for TEE or loop recorder per neurology. -PT/OT rec SNF placement pending. Patient has no insurance.  Acute metabolic encephalopathy -Confused despite fair orientation. No improvement with high-dose thiamine 10/14-10/16.  Might have some baseline cognitive impairment.  -Frequent orientation delirium precautions -Continue thiamine 100 mg daily.  Hypertensive emergency: resolved.   -Continue amlodipine, lisinopril  DKA/poorly controlled diabetes with hyperglycemia -DKA resolved.  A1c 7.9%.  Not on medication at home.  CBG slightly elevated. -Tradjenta added this hospitalization -Continue SSI   -Consider adding Metformin on discharge.  Hypothyroidism -TSH within normal -Continue Synthroid  Pyuria -No UTI symptoms per sister at bedside.  Urine cultures  negative x3. -Completed 3 days of Rocephin.  Intractable nausea/vomiting -Resolved  History of WPW syndrome -Normal EKG  GAD/agoraphobia -Appreciate psych input. Started on Zoloft   Failure to thrive/poor p.o. intake -Appreciate dietitian input -Supplements, multivitamin -Her weight has not dropped despite patient's lack of appetite and oral intake    DVT prophylaxis: Lovenox Code Status: DNR Family Communication: None at bedside Disposition Plan: Pending SNF placement    Consultants:   Neurology   Psychiatry   Procedures:   None   Antimicrobials:  Anti-infectives (From admission, onward)   Start     Dose/Rate Route Frequency Ordered Stop   11/03/18 1930  cefTRIAXone (ROCEPHIN) 1 g in sodium chloride 0.9 % 100 mL IVPB  Status:  Discontinued     1 g 200 mL/hr over 30 Minutes Intravenous Every 24 hours 11/03/18 1724 11/06/18 1118   11/03/18 0530  cefTRIAXone (ROCEPHIN) 1 g in sodium chloride 0.9 % 100 mL IVPB     1 g 200 mL/hr over 30 Minutes Intravenous  Once 11/03/18 0525 11/03/18 0629       Objective: Vitals:   11/20/18 0500 11/20/18 0722 11/20/18 1055 11/20/18 1128  BP:  (!) 147/68 (!) 146/62 (!) 128/51  Pulse:  79 73 75  Resp:  17  18  Temp:  98.4 F (36.9 C)  98 F (36.7 C)  TempSrc:  Oral  Oral  SpO2:  93%  95%  Weight: 95 kg       Intake/Output Summary (Last 24 hours) at 11/20/2018 1303 Last data filed at 11/20/2018 1052 Gross per 24 hour  Intake 240 ml  Output 800 ml  Net -560 ml   Filed  Weights   11/18/18 0210 11/19/18 0312 11/20/18 0500  Weight: 95 kg 95 kg 95 kg    Examination: General exam: Appears calm and comfortable  Psychiatry: Judgement and insight appear stable. Mood & affect appropriate.     Data Reviewed: I have personally reviewed following labs and imaging studies  CBC: No results for input(s): WBC, NEUTROABS, HGB, HCT, MCV, PLT in the last 168 hours. Basic Metabolic Panel: Recent Labs  Lab 11/14/18 0934   NA 140  K 3.8  CL 105  CO2 24  GLUCOSE 154*  BUN 13  CREATININE 0.55  CALCIUM 9.5   GFR: CrCl cannot be calculated (Unknown ideal weight.). Liver Function Tests: No results for input(s): AST, ALT, ALKPHOS, BILITOT, PROT, ALBUMIN in the last 168 hours. No results for input(s): LIPASE, AMYLASE in the last 168 hours. No results for input(s): AMMONIA in the last 168 hours. Coagulation Profile: No results for input(s): INR, PROTIME in the last 168 hours. Cardiac Enzymes: No results for input(s): CKTOTAL, CKMB, CKMBINDEX, TROPONINI in the last 168 hours. BNP (last 3 results) No results for input(s): PROBNP in the last 8760 hours. HbA1C: No results for input(s): HGBA1C in the last 72 hours. CBG: Recent Labs  Lab 11/18/18 2111 11/19/18 0615 11/19/18 1203 11/19/18 1623 11/20/18 1208  GLUCAP 294* 199* 279* 138* 284*   Lipid Profile: No results for input(s): CHOL, HDL, LDLCALC, TRIG, CHOLHDL, LDLDIRECT in the last 72 hours. Thyroid Function Tests: No results for input(s): TSH, T4TOTAL, FREET4, T3FREE, THYROIDAB in the last 72 hours. Anemia Panel: No results for input(s): VITAMINB12, FOLATE, FERRITIN, TIBC, IRON, RETICCTPCT in the last 72 hours. Sepsis Labs: No results for input(s): PROCALCITON, LATICACIDVEN in the last 168 hours.  No results found for this or any previous visit (from the past 240 hour(s)).    Radiology Studies: No results found.    Scheduled Meds: . amLODipine  10 mg Oral Daily  . aspirin EC  81 mg Oral Daily  . atorvastatin  80 mg Oral q1800  . clopidogrel  75 mg Oral Daily  . enoxaparin (LOVENOX) injection  40 mg Subcutaneous Q24H  . feeding supplement  1 Container Oral TID BM  . feeding supplement (PRO-STAT SUGAR FREE 64)  30 mL Oral TID  . insulin aspart  0-15 Units Subcutaneous TID WC  . insulin aspart  0-5 Units Subcutaneous QHS  . linagliptin  5 mg Oral Daily  . lisinopril  40 mg Oral Daily  . pantoprazole  40 mg Oral Daily  . sertraline   50 mg Oral Daily  . sodium chloride flush  10-40 mL Intracatheter Q12H  . thiamine  100 mg Oral Daily   Continuous Infusions:    LOS: 17 days      Time spent: 10 minutes   Dessa Phi, DO [facility] 11/20/2018, 1:02 PM   Available via Epic secure chat 7am-7pm After these hours, please refer to coverage provider listed on [email or web address]

## 2018-11-21 LAB — GLUCOSE, CAPILLARY
Glucose-Capillary: 290 mg/dL — ABNORMAL HIGH (ref 70–99)
Glucose-Capillary: 235 mg/dL — ABNORMAL HIGH (ref 70–99)
Glucose-Capillary: 221 mg/dL — ABNORMAL HIGH (ref 70–99)
Glucose-Capillary: 250 mg/dL — ABNORMAL HIGH (ref 70–99)
Glucose-Capillary: 205 mg/dL — ABNORMAL HIGH (ref 70–99)
Glucose-Capillary: 212 mg/dL — ABNORMAL HIGH (ref 70–99)

## 2018-11-21 MED ORDER — SODIUM CHLORIDE 0.9% FLUSH
10.0000 mL | Freq: Two times a day (BID) | INTRAVENOUS | Status: DC
  Administered 2018-11-21 – 2019-02-01 (×135): 10 mL

## 2018-11-21 MED ORDER — SODIUM CHLORIDE 0.9 % IV SOLN
INTRAVENOUS | Status: DC
  Administered 2018-11-21 – 2018-11-25 (×5): via INTRAVENOUS

## 2018-11-21 MED ORDER — INSULIN ASPART 100 UNIT/ML ~~LOC~~ SOLN
4.0000 [IU] | Freq: Three times a day (TID) | SUBCUTANEOUS | Status: DC
  Administered 2018-11-22 – 2019-01-31 (×195): 4 [IU] via SUBCUTANEOUS

## 2018-11-21 MED ORDER — SODIUM CHLORIDE 0.9% FLUSH
10.0000 mL | INTRAVENOUS | Status: DC | PRN
  Administered 2018-12-10 – 2019-01-15 (×3): 10 mL
  Filled 2018-11-21 (×2): qty 40

## 2018-11-21 MED ORDER — INSULIN GLARGINE 100 UNIT/ML ~~LOC~~ SOLN
10.0000 [IU] | Freq: Every day | SUBCUTANEOUS | Status: DC
  Administered 2018-11-21 – 2019-01-02 (×43): 10 [IU] via SUBCUTANEOUS
  Filled 2018-11-21 (×44): qty 0.1

## 2018-11-21 NOTE — Progress Notes (Signed)
SLP Cancellation Note  Patient Details Name: ZELLA ABOULHOSN MRN: [id number] DOB: 1955-08-08   Cancelled treatment:       Reason Eval/Treat Not Completed: RN reported that pt had recent episode of emesis and that they were currently cleaning her when SLP attempted to see the pt.  ST will f/u as appropriate.    Bretta Bang, M.S., [place] Acute Rehabilitation Services Office: [phone]  [place] 11/21/2018, 2:57 PM

## 2018-11-21 NOTE — Progress Notes (Signed)
Inpatient Diabetes Program Recommendations  AACE/ADA: New Consensus Statement on Inpatient Glycemic Control (2015)  Target Ranges:  Prepandial:   less than 140 mg/dL      Peak postprandial:   less than 180 mg/dL (1-2 hours)      Critically ill patients:  140 - 180 mg/dL   Results for Becky, Stout (MRN [id number]) as of 11/21/2018 10:25  Ref. Range 11/20/2018 06:21 11/20/2018 12:08 11/20/2018 15:28 11/20/2018 21:19  Glucose-Capillary Latest Ref Range: 70 - 99 mg/dL 235 (H)  Novolog HELD 284 (H)  8 units NOVOLOG  156 (H)  3 units NOVOLOG  303 (H)  4 units NOVOLOG    Results for [name], Becky Stout (MRN [id number]) as of 11/21/2018 10:25  Ref. Range 11/21/2018 06:01  Glucose-Capillary Latest Ref Range: 70 - 99 mg/dL 221 (H)  5 units NOVOLOG    Results for [name], Becky Stout (MRN [id number]) as of 11/21/2018 10:25  Ref. Range 11/03/2018 09:12  Hemoglobin A1C Latest Ref Range: 4.8 - 5.6 % 7.9 (H)    History: Type 2 Diabetes  Home DM Meds: None  Current Orders: Novolog Moderate Correction Scale/ SSI (0-15 units) TID AC + HS      Tradjenta 5 mg Daily     MD- Note plan to discharge patient on Tradjenta and possibly Metformin (on NO DM meds prior to admission)  AM CBGs have been consistently elevated in the hospital.  Please consider the following in-hospital insulin adjustments to help decrease CBGs:  1. Start Lantus 10 units Daily (0.1 units/kg)   2. Start Novolog Meal Coverage: Novolog 4 units TID with meals  (Please add the following Hold Parameters: Hold if pt eats <50% of meal, Hold if pt NPO)    --Will follow patient during hospitalization--  Wyn Quaker RN, MSN, CDE Diabetes Coordinator Inpatient Glycemic Control Team Team Pager: [phone] (8a-5p)

## 2018-11-21 NOTE — Progress Notes (Signed)
  Speech Language Pathology Treatment: Cognitive-Linquistic  Patient Details Name: Becky Stout MRN: [id number] DOB: 1955-05-01 Today's Date: 11/21/2018 Time: [id number] SLP Time Calculation (min) (ACUTE ONLY): 15 min  Assessment / Plan / Recommendation Clinical Impression  Pt was encountered awake/alert, sitting upright in bed.  She was pleasant and cooperative throughout this tx session.  Tx focused on cognition on this date.  Pt was oriented to self, month, and place.  She was not independently oriented to year; however, she was able to state the correct year given a choice of two.  Pt independently named 3 upcoming holidays given extra time.  Pt was educated regarding short-term memory strategies including, "write it down" and "verbal repetition".  Pt generated a 3 item grocery list and she utilized the repetition strategy to independently recall 3/3 items immediately and 3/3 items following a 5 minute delay.  She additionally completed basic problem solving questions with 50% accuracy given moderate cues.  Recommend continuation of ST targeting cognitive deficits.  Additionally recommend 24/7 supervision/assistance with IADLs (medications, finances, etc.) at time of discharge.     HPI HPI: 63 y.o. female with Past medical history of B12 deficiency, type II DM not on therapy, HTN, IDA, obesity, WPW syndrome. Patient was brought in secondary to increasing confusion as well as failure to thrive and a fall.  MRI revealed scattered small acute infarcts in both cerebral hemispheres and brainstem and chronic basal ganglia infarcts and moderately advanced cerebral atrophy.      SLP Plan  Continue with current plan of care       Recommendations                   Oral Care Recommendations: Oral care BID Follow up Recommendations: Skilled Nursing facility;24 hour supervision/assistance SLP Visit Diagnosis: Cognitive communication deficit [id number]) Plan: Continue with current plan of  care       Bretta Bang, M.S., [place] Office: [phone]              [place] 11/21/2018, 3:35 PM

## 2018-11-21 NOTE — Progress Notes (Signed)
Physical Therapy Treatment Patient Details Name: Becky Stout MRN: [id number] DOB: 1955-02-07 Today's Date: 11/21/2018    History of Present Illness 63 y.o. female with Past medical history of B12 deficiency, type II DM not on therapy, HTN, IDA, obesity, WPW syndrome.Pt dx with acute CVA, MRI reveals multiple cerebral and brainstem lacunar infarcts, acute metabolic encepholopathy, Hypertensive emergency, DKA, agorophobia, N/V, hypokalemia, and FTT.      PT Comments    Pt was able to get up OOB to the chair today, but it took and hour to work through her anxiety and convince her to let us help her stand.  She is very weak, shaky, and has soft BPs when up.  She did not eat breakfast because they did not have "chicken".  I am starting to wonder how much of her anxiety, fear, and cognitive presentation was baseline and how much is new from her strokes.  She remains appropriate for post acute rehab at discharge and goals were updated today.     Follow Up Recommendations  SNF     Equipment Recommendations  Wheelchair (measurements PT);Wheelchair cushion (measurements PT);Hospital bed;3in1 (PT)    Recommendations for Other Services   NA     Precautions / Restrictions Precautions Precautions: Fall Precaution Comments: extremely anxious, doesn't want to be touched and fearful of falling    Mobility  Bed Mobility Overal bed mobility: Needs Assistance Bed Mobility: Supine to Sit Rolling: Supervision         General bed mobility comments: Supervision for safety, extra time needed to assist pt in reasoning through/sequencing of the task.  Encouragement to participate (we need to get up because your bed is wet).   Transfers Overall transfer level: Needs assistance Equipment used: 2 person hand held assist Transfers: Sit to/from [facility] Sit to Stand: Mod assist;+2 physical assistance Stand pivot transfers: +2 physical assistance;Mod assist       General transfer  comment: Two person heavy mod assist to come to standing from a very low bed.  Pt refused to let therapist/RN raise the bed because it makes her more fearful and anxious of falling.  She could not reason through why she felt so weak and could not get up on her own and then it took her quite some time >20 mins seated EOB to agree to let therapist and RN physically assist her to the chair (and also touch her as she does not like to be touched).    Ambulation/Gait             General Gait Details: unable at this time, too weak.        Modified Rankin (Stroke Patients Only) Modified Rankin (Stroke Patients Only) Pre-Morbid Rankin Score: Moderate disability Modified Rankin: Moderately severe disability     Balance Overall balance assessment: Needs assistance Sitting-balance support: Feet supported;Bilateral upper extremity supported;Single extremity supported Sitting balance-Leahy Scale: Fair Sitting balance - Comments: close supervision EOB   Standing balance support: Bilateral upper extremity supported Standing balance-Leahy Scale: Poor Standing balance comment: needs heavy mod external assist.                             Cognition Arousal/Alertness: Awake/alert Behavior During Therapy: Anxious Overall Cognitive Status: Impaired/Different from baseline Area of Impairment: Memory;Following commands;Safety/judgement;Awareness;Problem solving;Orientation;Attention                 Orientation Level: Disoriented to;Person;Time;Situation Current Attention Level: Sustained Memory: Decreased recall of  precautions;Decreased short-term memory Following Commands: Follows one step commands with increased time Safety/Judgement: Decreased awareness of safety;Decreased awareness of deficits Awareness: Intellectual Problem Solving: Decreased initiation;Slow processing;Difficulty sequencing;Requires verbal cues;Requires tactile cues General Comments: Pt continues with  circular reasoning, needing step by step instructions, stalling, coming back to the starting point of the discussion multiple times before being able to move forward.  She does not recognize me and I have seen her multiple times before.  She needs significant extra time to participate in therapy.              Pertinent Vitals/Pain Pain Assessment: No/denies pain Pain Score: 0-No pain           PT Goals (current goals can now be found in the care plan section) Acute Rehab PT Goals Patient Stated Goal: To not fall and "be safe" PT Goal Formulation: With patient Time For Goal Achievement: 12/05/18 Potential to Achieve Goals: Fair Progress towards PT goals: Progressing toward goals    Frequency    Min 3X/week      PT Plan Current plan remains appropriate       AM-PAC PT "6 Clicks" Mobility   Outcome Measure  Help needed turning from your back to your side while in a flat bed without using bedrails?: A Schoenfelder Help needed moving from lying on your back to sitting on the side of a flat bed without using bedrails?: A Berrie Help needed moving to and from a bed to a chair (including a wheelchair)?: A Lot Help needed standing up from a chair using your arms (e.g., wheelchair or bedside chair)?: A Lot Help needed to walk in hospital room?: Total Help needed climbing 3-5 steps with a railing? : Total 6 Click Score: 12    End of Session Equipment Utilized During Treatment: Gait belt Activity Tolerance: Patient limited by fatigue Patient left: in chair;with call bell/phone within reach;with chair alarm set   PT Visit Diagnosis: Other abnormalities of gait and mobility (R26.89)     Time: [id number] PT Time Calculation (min) (ACUTE ONLY): 75 min  Charges:  $Therapeutic Activity: 38-52 mins $Self Care/Home Management: 23-37                    Becky Stout B. Edia Pursifull, PT, DPT  Acute Rehabilitation [phone] pager [phone] office  @ [name]:  [phone]   11/21/2018, 12:07 PM

## 2018-11-21 NOTE — Progress Notes (Signed)
PROGRESS NOTE    Becky Stout  [id number] DOB: 11/28/1955 DOA: 11/03/2018 PCP: Abner Greenspan, MD     Brief Narrative:  Becky Stout is a 63 year old female with history of DM-2, HTN, IDA, obesity and WPW syndrome presenting with increased confusion, fall and failure to thrive.  At baseline, patient lives alone.  Family brings food to her house on regular basis. She was admitted for DKA, acute CVA, metabolic encephalopathy and unwitnessed fall.   New events last 24 hours / Subjective: Nothing new to report. Didn't eat breakfast, doesn't like bacon.   Assessment & Plan:   Principal Problem:   Failure to thrive in adult Active Problems:   Hypothyroidism   B12 deficiency   WOLFF (WOLFE)-PARKINSON-WHITE (WPW) SYNDROME   Essential hypertension   Hyperlipidemia   Poorly controlled type 2 diabetes mellitus (HCC)   Anxiety   Acute lower UTI   High anion gap metabolic acidosis   DKA (diabetic ketoacidoses) (HCC)   Cerebral thrombosis with cerebral infarction    Acute CVA -MRI brain on 11/03/18 with multiple cerebral and brainstem lacunar infarcts -No need for TEE or loop recorder per neurology. -ASA 81 mg and Plavix for 3 weeks starting 10/12 followed by Plavix alone per neuro. -Continue statin. -PT/OT rec SNF placement pending. Patient has no insurance.  Acute metabolic encephalopathy -Confused despite fair orientation. No improvement with high-dose thiamine 10/14-10/16.  Might have some baseline cognitive impairment.  -Frequent orientation delirium precautions -Continue thiamine 100 mg daily.  Hypertensive emergency: resolved.   -Continue amlodipine, lisinopril  DKA/poorly controlled diabetes with hyperglycemia -DKA resolved.  A1c 7.9%.  Not on medication at home.  CBG slightly elevated. -Tradjenta added this hospitalization. Consider adding Metformin on discharge. -Continue SSI. Added lantus, mealtime novolog today  Hypothyroidism -TSH within normal  -Continue Synthroid  Pyuria -No UTI symptoms per sister at bedside.  Urine cultures negative x3. -Completed 3 days of Rocephin.  Intractable nausea/vomiting -Resolved  History of WPW syndrome -Normal EKG  GAD/agoraphobia -Appreciate psych input. Started on Zoloft   Failure to thrive/poor p.o. intake -Appreciate dietitian input -Supplements, multivitamin -Her weight has not dropped despite patient's lack of appetite and oral intake    DVT prophylaxis: Lovenox Code Status: DNR Family Communication: None at bedside Disposition Plan: Pending SNF placement    Consultants:   Neurology   Psychiatry   Procedures:   None   Antimicrobials:  Anti-infectives (From admission, onward)   Start     Dose/Rate Route Frequency Ordered Stop   11/03/18 1930  cefTRIAXone (ROCEPHIN) 1 g in sodium chloride 0.9 % 100 mL IVPB  Status:  Discontinued     1 g 200 mL/hr over 30 Minutes Intravenous Every 24 hours 11/03/18 1724 11/06/18 1118   11/03/18 0530  cefTRIAXone (ROCEPHIN) 1 g in sodium chloride 0.9 % 100 mL IVPB     1 g 200 mL/hr over 30 Minutes Intravenous  Once 11/03/18 0525 11/03/18 0629       Objective: Vitals:   11/20/18 2322 11/21/18 0324 11/21/18 0500 11/21/18 0840  BP: 110/62 (!) 124/56  (!) 129/54  Pulse: 70 70  73  Resp: 17 17  16   Temp: 98.1 F (36.7 C) 98.2 F (36.8 C)  98.1 F (36.7 C)  TempSrc: Oral Oral  Oral  SpO2: 95% 94%  94%  Weight:   95 kg    No intake or output data in the 24 hours ending 11/21/18 1110 Filed Weights   11/19/18 0312 11/20/18 0500 11/21/18 0500  Weight: 95 kg 95 kg 95 kg    Examination: General exam: Appears calm and comfortable  Psychiatry: Judgement and insight appear stable. Mood & affect appropriate.    Data Reviewed: I have personally reviewed following labs and imaging studies  CBC: No results for input(s): WBC, NEUTROABS, HGB, HCT, MCV, PLT in the last 168 hours. Basic Metabolic Panel: No results for input(s):  NA, K, CL, CO2, GLUCOSE, BUN, CREATININE, CALCIUM, MG, PHOS in the last 168 hours. GFR: CrCl cannot be calculated (Unknown ideal weight.). Liver Function Tests: No results for input(s): AST, ALT, ALKPHOS, BILITOT, PROT, ALBUMIN in the last 168 hours. No results for input(s): LIPASE, AMYLASE in the last 168 hours. No results for input(s): AMMONIA in the last 168 hours. Coagulation Profile: No results for input(s): INR, PROTIME in the last 168 hours. Cardiac Enzymes: No results for input(s): CKTOTAL, CKMB, CKMBINDEX, TROPONINI in the last 168 hours. BNP (last 3 results) No results for input(s): PROBNP in the last 8760 hours. HbA1C: No results for input(s): HGBA1C in the last 72 hours. CBG: Recent Labs  Lab 11/20/18 0621 11/20/18 1208 11/20/18 1528 11/20/18 2119 11/21/18 0601  GLUCAP 235* 284* 156* 303* 221*   Lipid Profile: No results for input(s): CHOL, HDL, LDLCALC, TRIG, CHOLHDL, LDLDIRECT in the last 72 hours. Thyroid Function Tests: No results for input(s): TSH, T4TOTAL, FREET4, T3FREE, THYROIDAB in the last 72 hours. Anemia Panel: No results for input(s): VITAMINB12, FOLATE, FERRITIN, TIBC, IRON, RETICCTPCT in the last 72 hours. Sepsis Labs: No results for input(s): PROCALCITON, LATICACIDVEN in the last 168 hours.  No results found for this or any previous visit (from the past 240 hour(s)).    Radiology Studies: No results found.    Scheduled Meds: . amLODipine  10 mg Oral Daily  . aspirin EC  81 mg Oral Daily  . atorvastatin  80 mg Oral q1800  . clopidogrel  75 mg Oral Daily  . enoxaparin (LOVENOX) injection  40 mg Subcutaneous Q24H  . feeding supplement  1 Container Oral TID BM  . feeding supplement (PRO-STAT SUGAR FREE 64)  30 mL Oral TID  . insulin aspart  0-15 Units Subcutaneous TID WC  . insulin aspart  0-5 Units Subcutaneous QHS  . insulin aspart  4 Units Subcutaneous TID WC  . insulin glargine  10 Units Subcutaneous QHS  . linagliptin  5 mg Oral Daily   . lisinopril  40 mg Oral Daily  . pantoprazole  40 mg Oral Daily  . sertraline  50 mg Oral Daily  . sodium chloride flush  10-40 mL Intracatheter Q12H  . thiamine  100 mg Oral Daily   Continuous Infusions:    LOS: 18 days      Time spent: 10 minutes   Dessa Phi, DO [facility] 11/21/2018, 11:10 AM   Available via Epic secure chat 7am-7pm After these hours, please refer to coverage provider listed on [email or web address]

## 2018-11-22 LAB — GLUCOSE, CAPILLARY
Glucose-Capillary: 159 mg/dL — ABNORMAL HIGH (ref 70–99)
Glucose-Capillary: 187 mg/dL — ABNORMAL HIGH (ref 70–99)
Glucose-Capillary: 167 mg/dL — ABNORMAL HIGH (ref 70–99)
Glucose-Capillary: 166 mg/dL — ABNORMAL HIGH (ref 70–99)

## 2018-11-22 NOTE — Progress Notes (Addendum)
  Speech Language Pathology Treatment: Cognitive-Linquistic  Patient Details Name: Becky Stout MRN: [id number] DOB: [birth year] Today's Date: 11/22/2018 Time: 1545-1600 SLP Time Calculation (min) (ACUTE ONLY): 15 min  Assessment / Plan / Recommendation Clinical Impression  Pt was encountered awake/alert and she was pleasant throughout this tx session.  Pt completed tasks targeting orientation, short-term memory, and basic problem solving.  Pt was independently oriented x6 to self, place, city, month, year, and date. Pt was unable to recall short-term memory strategies discussed in yesterday's tx session despite verbal cues, therefore she was re-educated.  Pt listened to two short stories and answered questions targeting short term recall with 100% accuracy independently.  She answered basic problem solving/safety judgement questions with 50% accuracy independently, improving to 100% accuracy given moderate verbal cues.  Pt answered all safety judgment questions relating to current situation (how to use the call bell, how to ask for help, etc.) with 100% accuracy independently.  Recommend 24/7 supervision and assistance with IADLs (medications, finances, etc.) at time of discharge. SLP will continue to follow up for cognitive treatment.    HPI HPI: 63 y.o. female with Past medical history of B12 deficiency, type II DM not on therapy, HTN, IDA, obesity, WPW syndrome. Patient was brought in secondary to increasing confusion as well as failure to thrive and a fall.  MRI revealed scattered small acute infarcts in both cerebral hemispheres and brainstem and chronic basal ganglia infarcts and moderately advanced cerebral atrophy.      SLP Plan  Continue with current plan of care       Recommendations                   Oral Care Recommendations: Oral care BID Follow up Recommendations: Skilled Nursing facility;24 hour supervision/assistance SLP Visit Diagnosis: Cognitive communication deficit  [id number]) Plan: Continue with current plan of care       Bretta Bang, M.S., [place] Office: [phone]              [place] 11/22/2018, 4:10 PM

## 2018-11-22 NOTE — Progress Notes (Signed)
PROGRESS NOTE    Becky Stout  [id number] DOB: 30-Jan-1955 DOA: 11/03/2018 PCP: Abner Greenspan, MD    Brief Narrative:  Becky Stout is a 63 year old female with history of DM-2, HTN, IDA, obesity and WPW syndrome presenting with increased confusion, fall and failure to thrive. At baseline, patient lives alone. Family brings food to her house on regular basis. She was admitted for DKA, acute CVA, metabolic encephalopathy and unwitnessed fall.   New events last 24 hours / Subjective: Decreased oral intake.  She says she is trying to control/restrict her food.  Assessment & Plan:   Principal Problem:   Failure to thrive in adult Active Problems:   Hypothyroidism   B12 deficiency   WOLFF (WOLFE)-PARKINSON-WHITE (WPW) SYNDROME   Essential hypertension   Hyperlipidemia   Poorly controlled type 2 diabetes mellitus (HCC)   Anxiety   Acute lower UTI   High anion gap metabolic acidosis   DKA (diabetic ketoacidoses) (HCC)   Cerebral thrombosis with cerebral infarction   Acute CVA -MRI brain on 11/03/18 with multiple cerebral and brainstem lacunar infarcts -No need for TEE or loop recorder per neurology. -ASA 81 mgand Plavix for 3 weeks starting 10/12 followed by Plavix alone per neuro. -Continue statin. -PT/OT rec SNF placement pending. Patient has no insurance.  Acute metabolic encephalopathy -Confused despite fair orientation. No improvement with high-dose thiamine 10/14-10/16. Might have some baseline cognitive impairment.  -Frequent orientation delirium precautions -Continue thiamine 100 mg daily.  Hypertensive emergency:resolved.  -Continue amlodipine, lisinopril 11/22/2018: Continue to monitor blood pressure closely and adjust medications as needed.  DKA/poorly controlled diabetes with hyperglycemia -DKA resolved. A1c 7.9%. Not on medication at home. CBG slightly elevated. -Tradjentaadded this hospitalization. Consider adding Metformin on discharge.  -Continue SSI. Added lantus, mealtime novolog today  Hypothyroidism -TSH within normal -Continue Synthroid  Pyuria -No UTI symptoms per sister at bedside. Urine cultures negative x3. -Completed 3 days of Rocephin.  Intractable nausea/vomiting -Resolved  History of WPW syndrome -Normal EKG  GAD/agoraphobia -Appreciate psych input. Started on Zoloft   Failure to thrive/poor p.o. intake -Appreciate dietitian input -Supplements, multivitamin -Her weight has not dropped despite patient's lack of appetite and oral intake   DVT prophylaxis: Lovenox Code Status: DNR Family Communication: None at bedside Disposition Plan: Pending SNF placement    Consultants:   Neurology   Psychiatry   Procedures:   None    Subjective: Continues to have decreased oral intake.  Denies having any major complaints at this time.  Objective: Vitals:   11/22/18 0000 11/22/18 0400 11/22/18 0500 11/22/18 0850  BP: 139/63 (!) 141/60  (!) 151/66  Pulse: 67 72  78  Resp: 18 18  18   Temp: 98.6 F (37 C) 98.4 F (36.9 C)  98.3 F (36.8 C)  TempSrc: Oral Oral  Oral  SpO2: 97% 98%  97%  Weight:   97 kg     Intake/Output Summary (Last 24 hours) at 11/22/2018 0950 Last data filed at 11/22/2018 0400 Gross per 24 hour  Intake 1238.19 ml  Output 600 ml  Net 638.19 ml   Filed Weights   11/20/18 0500 11/21/18 0500 11/22/18 0500  Weight: 95 kg 95 kg 97 kg    Examination:  General exam: Appears calm and comfortable  Respiratory system: Clear to auscultation. Respiratory effort normal. Cardiovascular system: S1 & S2 heard, RRR. No murmur. No pedal edema. Gastrointestinal system: Abdomen is nondistended, soft and nontender. No organomegaly or masses felt. Normal bowel sounds heard. Central nervous system:  Alert and oriented.  Has some generalized weakness but no focal neurological deficits. Extremities: no edema Skin: No rashes Psychiatry: Mood & affect appropriate.      Data Reviewed: I have personally reviewed following labs and imaging studies  CBC: No results for input(s): WBC, NEUTROABS, HGB, HCT, MCV, PLT in the last 168 hours. Basic Metabolic Panel: No results for input(s): NA, K, CL, CO2, GLUCOSE, BUN, CREATININE, CALCIUM, MG, PHOS in the last 168 hours. GFR: CrCl cannot be calculated (Unknown ideal weight.). Liver Function Tests: No results for input(s): AST, ALT, ALKPHOS, BILITOT, PROT, ALBUMIN in the last 168 hours. No results for input(s): LIPASE, AMYLASE in the last 168 hours. No results for input(s): AMMONIA in the last 168 hours. Coagulation Profile: No results for input(s): INR, PROTIME in the last 168 hours. Cardiac Enzymes: No results for input(s): CKTOTAL, CKMB, CKMBINDEX, TROPONINI in the last 168 hours. BNP (last 3 results) No results for input(s): PROBNP in the last 8760 hours. HbA1C: No results for input(s): HGBA1C in the last 72 hours. CBG: Recent Labs  Lab 11/21/18 0601 11/21/18 1133 11/21/18 1659 11/21/18 2215 11/22/18 0812  GLUCAP 221* 250* 205* 212* 159*   Lipid Profile: No results for input(s): CHOL, HDL, LDLCALC, TRIG, CHOLHDL, LDLDIRECT in the last 72 hours. Thyroid Function Tests: No results for input(s): TSH, T4TOTAL, FREET4, T3FREE, THYROIDAB in the last 72 hours. Anemia Panel: No results for input(s): VITAMINB12, FOLATE, FERRITIN, TIBC, IRON, RETICCTPCT in the last 72 hours. Sepsis Labs: No results for input(s): PROCALCITON, LATICACIDVEN in the last 168 hours.  No results found for this or any previous visit (from the past 240 hour(s)).       Radiology Studies: No results found.      Scheduled Meds: . amLODipine  10 mg Oral Daily  . aspirin EC  81 mg Oral Daily  . atorvastatin  80 mg Oral q1800  . clopidogrel  75 mg Oral Daily  . enoxaparin (LOVENOX) injection  40 mg Subcutaneous Q24H  . feeding supplement  1 Container Oral TID BM  . feeding supplement (PRO-STAT SUGAR FREE 64)  30 mL Oral  TID  . insulin aspart  0-15 Units Subcutaneous TID WC  . insulin aspart  0-5 Units Subcutaneous QHS  . insulin aspart  4 Units Subcutaneous TID WC  . insulin glargine  10 Units Subcutaneous QHS  . linagliptin  5 mg Oral Daily  . lisinopril  40 mg Oral Daily  . pantoprazole  40 mg Oral Daily  . sertraline  50 mg Oral Daily  . sodium chloride flush  10-40 mL Intracatheter Q12H  . thiamine  100 mg Oral Daily   Continuous Infusions: . sodium chloride 100 mL/hr at 11/22/18 0400     LOS: 19 days    Yaakov Guthrie, MD [facility] Pager on amion  If 7PM-7AM, please contact night-coverage [email or web address] Password [email or web address] 11/22/2018, 9:50 AM

## 2018-11-23 LAB — CBC
HCT: 35.2 % — ABNORMAL LOW (ref 36.0–46.0)
Hemoglobin: 12.2 g/dL (ref 12.0–15.0)
MCH: 30.9 pg (ref 26.0–34.0)
MCHC: 34.7 g/dL (ref 30.0–36.0)
MCV: 89.1 fL (ref 80.0–100.0)
Platelets: 291 10*3/uL (ref 150–400)
RBC: 3.95 MIL/uL (ref 3.87–5.11)
RDW: 12.7 % (ref 11.5–15.5)
WBC: 8.8 10*3/uL (ref 4.0–10.5)
nRBC: 0 % (ref 0.0–0.2)

## 2018-11-23 LAB — BASIC METABOLIC PANEL
Anion gap: 10 (ref 5–15)
BUN: 17 mg/dL (ref 8–23)
CO2: 23 mmol/L (ref 22–32)
Calcium: 8.7 mg/dL — ABNORMAL LOW (ref 8.9–10.3)
Chloride: 110 mmol/L (ref 98–111)
Creatinine, Ser: 0.55 mg/dL (ref 0.44–1.00)
GFR calc Af Amer: >60 mL/min (ref >60)
GFR calc non Af Amer: >60 mL/min (ref >60)
Glucose, Bld: 237 mg/dL — ABNORMAL HIGH (ref 70–99)
Potassium: 2.6 mmol/L — CL (ref 3.5–5.1)
Sodium: 143 mmol/L (ref 135–145)

## 2018-11-23 LAB — GLUCOSE, CAPILLARY
Glucose-Capillary: 184 mg/dL — ABNORMAL HIGH (ref 70–99)
Glucose-Capillary: 206 mg/dL — ABNORMAL HIGH (ref 70–99)
Glucose-Capillary: 112 mg/dL — ABNORMAL HIGH (ref 70–99)
Glucose-Capillary: 208 mg/dL — ABNORMAL HIGH (ref 70–99)

## 2018-11-23 MED ORDER — BOOST / RESOURCE BREEZE PO LIQD CUSTOM
1.0000 | Freq: Four times a day (QID) | ORAL | Status: DC
  Administered 2018-11-23 – 2018-12-05 (×39): 1 via ORAL
  Filled 2018-11-23 (×6): qty 1

## 2018-11-23 MED ORDER — POTASSIUM CHLORIDE CRYS ER 20 MEQ PO TBCR
20.0000 meq | EXTENDED_RELEASE_TABLET | Freq: Two times a day (BID) | ORAL | Status: DC
  Administered 2018-11-23 (×2): 20 meq via ORAL
  Filled 2018-11-23 (×4): qty 1

## 2018-11-23 MED ORDER — POTASSIUM CHLORIDE 20 MEQ PO PACK
40.0000 meq | PACK | Freq: Once | ORAL | Status: AC
  Administered 2018-11-23: 40 meq via ORAL
  Filled 2018-11-23: qty 2

## 2018-11-23 NOTE — Progress Notes (Signed)
Put in order earlier with no response to the IV Team nurse then put in another order later and paged as well because her left arm appeared swollen and this nurse questioned if Midline site was infiltrated.  IVF were infusing so this nurse stopped them until it could be confirmed.  She denied pain to area and bilat arms appear slightly swollen but left moreso this afternoon.  IVT nurse did come and assess the site and thanked this nurse for not removing Midline because she said site is ok.  She does not have blood return but did not have before and it is common to not have a blood return from a midline at times and it still be patent.  IVF were eventually restarted.

## 2018-11-23 NOTE — Progress Notes (Signed)
Nutrition Follow-up  DOCUMENTATION CODES:   Not applicable  INTERVENTION:  Provide Boost Breeze po QID, each supplement provides 250 kcal and 9 grams of protein.  Provide 30 ml Prostat po TID, each supplement provides 100 kcal and 15 grams of protein.   Encourage adequate PO intake.   NUTRITION DIAGNOSIS:   Inadequate oral intake related to poor appetite as evidenced by per patient/family report; improving   GOAL:   Patient will meet greater than or equal to 90% of their needs; progresing  MONITOR:   PO intake, Supplement acceptance, Skin, Weight trends, Labs, I & O's  REASON FOR ASSESSMENT:   Consult Assessment of nutrition requirement/status  ASSESSMENT:   63 y.o. female with Past medical history of B12 deficiency, type II DM not on therapy, HTN, CHF, IDA, obesity, WPW syndrome.Patient was brought into secondary to increasing confusion as well as failure to thrive and a fall.62 y.o. female with Past medical history of B12 deficiency, type II DM not on therapy, HTN, IDA, obesity, WPW syndrome.Patient was brought into secondary to increasing confusion as well as failure to thrive and a fall. Scattered small acute subcortical infarcts in both cerebral hemispheres and brainstem.  Meal completion has been varied from 0-75%. Pt did not eat breakfast due to dislike of food at meal tray. Pt currently has Boost Breeze and Prostat ordered and has been consuming them. RD to increase Boost Breeze to QID and continue Prostat to aid in caloric and protein needs. Pt encouraged to eat her food at meals and to drink her supplements. Labs and medications reviewed. SNF placement pending.  Diet Order:   Diet Order            Diet Carb Modified Fluid consistency: Thin; Room service appropriate? Yes  Diet effective now              EDUCATION NEEDS:   Not appropriate for education at this time  Skin:  Skin Assessment: Reviewed RN Assessment  Last BM:  10/27  Height:   Ht Readings  from Last 1 Encounters:  05/26/17 5\' 4"  (1.626 m)    Weight:   Wt Readings from Last 1 Encounters:  11/22/18 97 kg    Ideal Body Weight:  54.5 kg  BMI:  Body mass index is 36.71 kg/m.  Estimated Nutritional Needs:   Kcal:  1800-2000  Protein:  85-100 grams  Fluid:  >/= 1.8 L/day    Corrin Parker, MS, RD, LDN Pager # [phone] After hours/ weekend pager # [phone]

## 2018-11-23 NOTE — Progress Notes (Signed)
MD notified of potassium level of 2.6. Awaiting reply.

## 2018-11-23 NOTE — Progress Notes (Addendum)
PT Cancellation Note  Patient Details Name: Becky Stout MRN: [id number] DOB: November 23, 1955   Cancelled Treatment:    Reason Eval/Treat Not Completed: Patient declined, no reason specified Pt stating she is resting, refused therapy session.  [name], PT, DPT Acute Rehabilitation Services Pager [phone] Office [phone]    [name] 11/23/2018, 1:42 PM

## 2018-11-23 NOTE — Progress Notes (Signed)
PROGRESS NOTE    Becky Stout  [id number] DOB: 1956/01/23 DOA: 11/03/2018 PCP: Abner Greenspan, MD   Brief Narrative:  Becky Chance Littleis a 63 year old female with history of DM-2, HTN, IDA, obesity and WPW syndrome presenting with increased confusion, fall and failure to thrive. At baseline, patient lives alone. Family brings food to her house on regular basis. She was admitted for DKA, acute CVA, metabolic encephalopathy and unwitnessed fall.   New events last 24 hours / Subjective: Continues to have decreased oral intake.  She says she did not eat breakfast because she did not like it.   Assessment & Plan:   Principal Problem:   Failure to thrive in adult Active Problems:   Hypothyroidism   B12 deficiency   WOLFF (WOLFE)-PARKINSON-WHITE (WPW) SYNDROME   Essential hypertension   Hyperlipidemia   Poorly controlled type 2 diabetes mellitus (HCC)   Anxiety   Acute lower UTI   High anion gap metabolic acidosis   DKA (diabetic ketoacidoses) (HCC)   Cerebral thrombosis with cerebral infarction   Acute CVA -MRI brain on 11/03/18 with multiple cerebral and brainstem lacunar infarcts -No need for TEE or loop recorder per neurology. -ASA 81 mgand Plavix for 3 weeks starting 10/12 followed by Plavix alone per neuro. -Continue statin. -PT/OT rec SNF placement pending. Patient has no insurance.  Acute metabolic encephalopathy -On and off confused despite fair orientation. Might have some baseline cognitive impairment.  -Frequent orientation delirium precautions -Continue thiamine 100 mg daily.  Hypertensive emergency:resolved.  -Continue amlodipine, lisinopril 11/23/2018: Continue to monitor blood pressure closely and adjust medications as needed.  DKA/poorly controlled diabetes with hyperglycemia -DKA resolved. A1c 7.9%. Not on medication at home. CBG slightly elevated. -Tradjentaadded this hospitalization.Consider adding Metformin on discharge. -Continue  SSI. On lantus, mealtime novolog  Hypothyroidism -TSH within normal -Continue Synthroid  Pyuria -No UTI symptoms. Urine cultures negative x3. -Completed 3 days of Rocephin.  Intractable nausea/vomiting -Resolved  History of WPW syndrome -Normal EKG  GAD/agoraphobia -Appreciate psych input. Started on Zoloft   Failure to thrive/poor p.o. intake -Appreciate dietitian input -Supplements, multivitamin  Hypokalemia Potassium replacement ordered. Continue to monitor electrolytes and replace as needed.   DVT prophylaxis:Lovenox Code Status:DNR Family Communication:None at bedside Disposition Plan:Pending SNF placement    Consultants:  Neurology   Psychiatry   Procedures:  None   Objective: Vitals:   11/22/18 1941 11/23/18 0006 11/23/18 0440 11/23/18 0801  BP: (!) 151/63 134/68 (!) 154/70 90/63  Pulse: 76 79 79 79  Resp: 18 18 18 16   Temp: 99.4 F (37.4 C) 99 F (37.2 C) 98.6 F (37 C) 98 F (36.7 C)  TempSrc: Oral Oral Oral Oral  SpO2: 98% 98% 97% 98%  Weight:        Intake/Output Summary (Last 24 hours) at 11/23/2018 1014 Last data filed at 11/23/2018 0700 Gross per 24 hour  Intake 861.8 ml  Output 1300 ml  Net -438.2 ml   Filed Weights   11/20/18 0500 11/21/18 0500 11/22/18 0500  Weight: 95 kg 95 kg 97 kg    Examination:  General exam: Appears calm and comfortable  Respiratory system: Clear to auscultation. Respiratory effort normal. Cardiovascular system: S1 & S2 heard, RRR. No murmur. No pedal edema. Gastrointestinal system: Abdomen is nondistended, soft and nontender. No organomegaly or masses felt. Normal bowel sounds heard. Central nervous system: Alert and oriented. No focal neurological deficits. Extremities: Symmetric  Skin: No rashes Psychiatry: Mood & affect appropriate.     Data Reviewed: I  have personally reviewed following labs and imaging studies  CBC: Recent Labs  Lab 11/23/18 0318  WBC 8.8  HGB  12.2  HCT 35.2*  MCV 89.1  PLT [id number]   Basic Metabolic Panel: Recent Labs  Lab 11/23/18 0318  NA 143  K 2.6*  CL 110  CO2 23  GLUCOSE 237*  BUN 17  CREATININE 0.55  CALCIUM 8.7*   GFR: CrCl cannot be calculated (Unknown ideal weight.). Liver Function Tests: No results for input(s): AST, ALT, ALKPHOS, BILITOT, PROT, ALBUMIN in the last 168 hours. No results for input(s): LIPASE, AMYLASE in the last 168 hours. No results for input(s): AMMONIA in the last 168 hours. Coagulation Profile: No results for input(s): INR, PROTIME in the last 168 hours. Cardiac Enzymes: No results for input(s): CKTOTAL, CKMB, CKMBINDEX, TROPONINI in the last 168 hours. BNP (last 3 results) No results for input(s): PROBNP in the last 8760 hours. HbA1C: No results for input(s): HGBA1C in the last 72 hours. CBG: Recent Labs  Lab 11/22/18 0812 11/22/18 1154 11/22/18 1705 11/22/18 2112 11/23/18 0630  GLUCAP 159* 187* 167* 166* 184*   Lipid Profile: No results for input(s): CHOL, HDL, LDLCALC, TRIG, CHOLHDL, LDLDIRECT in the last 72 hours. Thyroid Function Tests: No results for input(s): TSH, T4TOTAL, FREET4, T3FREE, THYROIDAB in the last 72 hours. Anemia Panel: No results for input(s): VITAMINB12, FOLATE, FERRITIN, TIBC, IRON, RETICCTPCT in the last 72 hours. Sepsis Labs: No results for input(s): PROCALCITON, LATICACIDVEN in the last 168 hours.  No results found for this or any previous visit (from the past 240 hour(s)).       Radiology Studies: No results found.      Scheduled Meds: . amLODipine  10 mg Oral Daily  . aspirin EC  81 mg Oral Daily  . atorvastatin  80 mg Oral q1800  . clopidogrel  75 mg Oral Daily  . enoxaparin (LOVENOX) injection  40 mg Subcutaneous Q24H  . feeding supplement  1 Container Oral TID BM  . feeding supplement (PRO-STAT SUGAR FREE 64)  30 mL Oral TID  . insulin aspart  0-15 Units Subcutaneous TID WC  . insulin aspart  0-5 Units Subcutaneous QHS  .  insulin aspart  4 Units Subcutaneous TID WC  . insulin glargine  10 Units Subcutaneous QHS  . linagliptin  5 mg Oral Daily  . lisinopril  40 mg Oral Daily  . pantoprazole  40 mg Oral Daily  . potassium chloride  20 mEq Oral BID  . sertraline  50 mg Oral Daily  . sodium chloride flush  10-40 mL Intracatheter Q12H  . thiamine  100 mg Oral Daily   Continuous Infusions: . sodium chloride 100 mL/hr at 11/22/18 1758     LOS: 20 days    Yaakov Guthrie, MD [facility] Pager on [place]  If 7PM-7AM, please contact night-coverage [email or web address] Password [email or web address] 11/23/2018, 10:14 AM

## 2018-11-24 LAB — BASIC METABOLIC PANEL
Anion gap: 9 (ref 5–15)
Anion gap: 9 (ref 5–15)
BUN: 11 mg/dL (ref 8–23)
BUN: 10 mg/dL (ref 8–23)
CO2: 22 mmol/L (ref 22–32)
CO2: 21 mmol/L — ABNORMAL LOW (ref 22–32)
Calcium: 8.3 mg/dL — ABNORMAL LOW (ref 8.9–10.3)
Calcium: 7.9 mg/dL — ABNORMAL LOW (ref 8.9–10.3)
Chloride: 110 mmol/L (ref 98–111)
Chloride: 110 mmol/L (ref 98–111)
Creatinine, Ser: 0.39 mg/dL — ABNORMAL LOW (ref 0.44–1.00)
Creatinine, Ser: 0.44 mg/dL (ref 0.44–1.00)
GFR calc Af Amer: >60 mL/min (ref >60)
GFR calc Af Amer: >60 mL/min (ref >60)
GFR calc non Af Amer: >60 mL/min (ref >60)
GFR calc non Af Amer: >60 mL/min (ref >60)
Glucose, Bld: 180 mg/dL — ABNORMAL HIGH (ref 70–99)
Glucose, Bld: 159 mg/dL — ABNORMAL HIGH (ref 70–99)
Potassium: 2.2 mmol/L — CL (ref 3.5–5.1)
Potassium: 3 mmol/L — ABNORMAL LOW (ref 3.5–5.1)
Sodium: 141 mmol/L (ref 135–145)
Sodium: 140 mmol/L (ref 135–145)

## 2018-11-24 LAB — CBC
HCT: 35.3 % — ABNORMAL LOW (ref 36.0–46.0)
Hemoglobin: 12.3 g/dL (ref 12.0–15.0)
MCH: 30.4 pg (ref 26.0–34.0)
MCHC: 34.8 g/dL (ref 30.0–36.0)
MCV: 87.4 fL (ref 80.0–100.0)
Platelets: 267 10*3/uL (ref 150–400)
RBC: 4.04 MIL/uL (ref 3.87–5.11)
RDW: 12.9 % (ref 11.5–15.5)
WBC: 8.6 10*3/uL (ref 4.0–10.5)
nRBC: 0 % (ref 0.0–0.2)

## 2018-11-24 LAB — GLUCOSE, CAPILLARY
Glucose-Capillary: 166 mg/dL — ABNORMAL HIGH (ref 70–99)
Glucose-Capillary: 145 mg/dL — ABNORMAL HIGH (ref 70–99)
Glucose-Capillary: 173 mg/dL — ABNORMAL HIGH (ref 70–99)
Glucose-Capillary: 137 mg/dL — ABNORMAL HIGH (ref 70–99)

## 2018-11-24 LAB — MAGNESIUM: Magnesium: 1.6 mg/dL — ABNORMAL LOW (ref 1.7–2.4)

## 2018-11-24 MED ORDER — POTASSIUM CHLORIDE 20 MEQ PO PACK
40.0000 meq | PACK | Freq: Three times a day (TID) | ORAL | Status: DC
  Administered 2018-11-24: 40 meq via ORAL

## 2018-11-24 MED ORDER — POTASSIUM CHLORIDE 10 MEQ/100ML IV SOLN
10.0000 meq | INTRAVENOUS | Status: AC
  Administered 2018-11-24 (×6): 10 meq via INTRAVENOUS
  Filled 2018-11-24: qty 100

## 2018-11-24 MED ORDER — MAGNESIUM SULFATE 2 GM/50ML IV SOLN
2.0000 g | Freq: Once | INTRAVENOUS | Status: AC
  Administered 2018-11-24: 2 g via INTRAVENOUS
  Filled 2018-11-24: qty 50

## 2018-11-24 MED ORDER — POTASSIUM CHLORIDE CRYS ER 20 MEQ PO TBCR
20.0000 meq | EXTENDED_RELEASE_TABLET | Freq: Two times a day (BID) | ORAL | Status: AC
  Administered 2018-11-25 (×2): 20 meq via ORAL
  Filled 2018-11-24 (×2): qty 1

## 2018-11-24 NOTE — Progress Notes (Signed)
PROGRESS NOTE    Becky Stout  [id number] DOB: 1955-05-22 DOA: 11/03/2018 PCP: Abner Greenspan, MD   Brief Narrative:  63 year old female with history of DM-2, HTN, IDA, obesity and WPW syndrome presenting with increased confusion, fall and failure to thrive. At baseline, patient lives alone. Family brings food to her house on regular basis. She was admitted for DKA, acute CVA, metabolic encephalopathy and unwitnessed fall.  Currently aggressively repleting her electrolytes.  In the meantime awaiting placement at skilled nursing facility.   Assessment & Plan:   Principal Problem:   Failure to thrive in adult Active Problems:   Hypothyroidism   B12 deficiency   WOLFF (WOLFE)-PARKINSON-WHITE (WPW) SYNDROME   Essential hypertension   Hyperlipidemia   Poorly controlled type 2 diabetes mellitus (HCC)   Anxiety   Acute lower UTI   High anion gap metabolic acidosis   DKA (diabetic ketoacidoses) (HCC)   Cerebral thrombosis with cerebral infarction  Severe hypokalemia; 2.2 Hypomagnesemia -Aggressive repletion ordered.  Repeat labs again later today.  Acute CVA -MRI brain on 11/03/18 with multiple cerebral and brainstem lacunar infarcts -No need for TEE or loop recorder per neurology. -ASA 81 mgand Plavix for 3 weeks starting 10/12 followed by Plavix alone per neuro. -Continue statin. -PT/OT-recommending skilled nursing facility.  Awaiting placement.  Acute metabolic encephalopathy -During my evaluation this morning she was alert awake oriented.  Mentating well. -Continue thiamine 100 mg daily.  Hypertensive emergency:resolved.  -Continue amlodipine, lisinopril  DKA/poorly controlled diabetes with hyperglycemia -DKA resolved. A1c 7.9%. Not on medication at home. CBG slightly elevated. -Continue current medications.  Hypothyroidism -TSH within normal -Continue Synthroid  Pyuria -No UTI symptoms. Urine cultures negative x3. -Previously completed 3-day  course of Rocephin  Intractable nausea/vomiting -Resolved  History of WPW syndrome -Normal EKG  GAD/agoraphobia -Appreciate psych input. Started on Zoloft   Failure to thrive/poor p.o. intake -Appreciate dietitian input -Supplements, multivitamin   DVT prophylaxis: Lovenox Code Status: DNR Family Communication: None Disposition Plan: We will aggressively replete her electrolytes today.  In the meantime await skilled nursing facility placement  Consultants:   Neurology  Psychiatry  Procedures:   None  Subjective: Sitting up at the bedside, does not have any complaints today.  She feels okay overall.  Review of Systems Otherwise negative except as per HPI, including: General: Denies fever, chills, night sweats or unintended weight loss. Resp: Denies cough, wheezing, shortness of breath. Cardiac: Denies chest pain, palpitations, orthopnea, paroxysmal nocturnal dyspnea. GI: Denies abdominal pain, nausea, vomiting, diarrhea or constipation GU: Denies dysuria, frequency, hesitancy or incontinence MS: Denies muscle aches, joint pain or swelling Neuro: Denies headache, neurologic deficits (focal weakness, numbness, tingling), abnormal gait Psych: Denies anxiety, depression, SI/HI/AVH Skin: Denies new rashes or lesions ID: Denies sick contacts, exotic exposures, travel  Objective: Vitals:   11/23/18 1958 11/23/18 2328 11/24/18 0421 11/24/18 0851  BP: (!) 156/63 (!) 141/58 (!) 163/67 (!) 157/69  Pulse: 79 78 75   Resp: 18 15 16    Temp: 98.7 F (37.1 C) 98.2 F (36.8 C) 98.7 F (37.1 C)   TempSrc: Oral Oral Oral   SpO2: 98% 98% 97%   Weight:        Intake/Output Summary (Last 24 hours) at 11/24/2018 1004 Last data filed at 11/23/2018 1710 Gross per 24 hour  Intake 2710.4 ml  Output 660 ml  Net 2050.4 ml   Filed Weights   11/20/18 0500 11/21/18 0500 11/22/18 0500  Weight: 95 kg 95 kg 97 kg  Examination:  General exam: Appears calm and comfortable   Respiratory system: Clear to auscultation. Respiratory effort normal. Cardiovascular system: S1 & S2 heard, RRR. No JVD, murmurs, rubs, gallops or clicks. No pedal edema. Gastrointestinal system: Abdomen is nondistended, soft and nontender. No organomegaly or masses felt. Normal bowel sounds heard. Central nervous system: Alert and oriented. No focal neurological deficits. Extremities: Symmetric 5 x 5 power. Skin: No rashes, lesions or ulcers Psychiatry: Overall flat affect   Data Reviewed:   CBC: Recent Labs  Lab 11/23/18 0318 11/24/18 0309  WBC 8.8 8.6  HGB 12.2 12.3  HCT 35.2* 35.3*  MCV 89.1 87.4  PLT 291 [id number]   Basic Metabolic Panel: Recent Labs  Lab 11/23/18 0318 11/24/18 0309  NA 143 141  K 2.6* 2.2*  CL 110 110  CO2 23 22  GLUCOSE 237* 180*  BUN 17 11  CREATININE 0.55 0.39*  CALCIUM 8.7* 8.3*  MG  --  1.6*   GFR: CrCl cannot be calculated (Unknown ideal weight.). Liver Function Tests: No results for input(s): AST, ALT, ALKPHOS, BILITOT, PROT, ALBUMIN in the last 168 hours. No results for input(s): LIPASE, AMYLASE in the last 168 hours. No results for input(s): AMMONIA in the last 168 hours. Coagulation Profile: No results for input(s): INR, PROTIME in the last 168 hours. Cardiac Enzymes: No results for input(s): CKTOTAL, CKMB, CKMBINDEX, TROPONINI in the last 168 hours. BNP (last 3 results) No results for input(s): PROBNP in the last 8760 hours. HbA1C: No results for input(s): HGBA1C in the last 72 hours. CBG: Recent Labs  Lab 11/23/18 0630 11/23/18 1150 11/23/18 1616 11/23/18 2115 11/24/18 0633  GLUCAP 184* 206* 112* 208* 166*   Lipid Profile: No results for input(s): CHOL, HDL, LDLCALC, TRIG, CHOLHDL, LDLDIRECT in the last 72 hours. Thyroid Function Tests: No results for input(s): TSH, T4TOTAL, FREET4, T3FREE, THYROIDAB in the last 72 hours. Anemia Panel: No results for input(s): VITAMINB12, FOLATE, FERRITIN, TIBC, IRON, RETICCTPCT in the  last 72 hours. Sepsis Labs: No results for input(s): PROCALCITON, LATICACIDVEN in the last 168 hours.  No results found for this or any previous visit (from the past 240 hour(s)).       Radiology Studies: No results found.      Scheduled Meds: . amLODipine  10 mg Oral Daily  . aspirin EC  81 mg Oral Daily  . atorvastatin  80 mg Oral q1800  . clopidogrel  75 mg Oral Daily  . enoxaparin (LOVENOX) injection  40 mg Subcutaneous Q24H  . feeding supplement  1 Container Oral QID  . feeding supplement (PRO-STAT SUGAR FREE 64)  30 mL Oral TID  . insulin aspart  0-15 Units Subcutaneous TID WC  . insulin aspart  0-5 Units Subcutaneous QHS  . insulin aspart  4 Units Subcutaneous TID WC  . insulin glargine  10 Units Subcutaneous QHS  . linagliptin  5 mg Oral Daily  . lisinopril  40 mg Oral Daily  . pantoprazole  40 mg Oral Daily  . [START ON 11/25/2018] potassium chloride  20 mEq Oral BID  . sertraline  50 mg Oral Daily  . sodium chloride flush  10-40 mL Intracatheter Q12H  . thiamine  100 mg Oral Daily   Continuous Infusions: . sodium chloride 100 mL/hr at 11/23/18 1710  . magnesium sulfate bolus IVPB    . potassium chloride 10 mEq (11/24/18 0945)     LOS: 21 days   Time spent= 25 mins     Arsenio Loader, MD  [facility]  If 7PM-7AM, please contact night-coverage  11/24/2018, 10:04 AM

## 2018-11-24 NOTE — Plan of Care (Signed)
°  Problem: Coping: °Goal: Level of anxiety will decrease °Outcome: Progressing °  °

## 2018-11-24 NOTE — Progress Notes (Signed)
Pt refusing to eat. RN offered pt to re-order to something she would like , but pt declined. RN also offered different food options from fridg,  but pt also refused. Will continue to try.

## 2018-11-24 NOTE — Progress Notes (Addendum)
Occupational Therapy Treatment Patient Details Name: Becky Stout MRN: [id number] DOB: 19-Feb-1955 Today's Date: 11/24/2018    History of present illness 63 y.o. female with Past medical history of B12 deficiency, type II DM not on therapy, HTN, IDA, obesity, WPW syndrome.Pt dx with acute CVA, MRI reveals multiple cerebral and brainstem lacunar infarcts, acute metabolic encepholopathy, Hypertensive emergency, DKA, agorophobia, N/V, hypokalemia, and FTT.     OT comments  Pt. Seen for skilled OT treatment session.  Focus of session pt. Participation in grooming tasks  As part of continued goals for increasing safety and independence with ADLs.  Pt. Able to complete 3 consecutive grooming tasks including oral care, washing face, and combing hair.  Tolerated bed modifications and adjustments to mimic seated position.  Moderate instructions and redirection required but pt. Performed well today. Will discuss with OTR/L goal modifications and updates needed.    Follow Up Recommendations  SNF    Equipment Recommendations       Recommendations for Other Services      Precautions / Restrictions Precautions Precautions: Fall Precaution Comments: extremely anxious, doesn't want to be touched and fearful of falling       Mobility Bed Mobility               General bed mobility comments: agreeable to have hob elevated to seated position  Transfers                      Balance                                           ADL either performed or assessed with clinical judgement   ADL Overall ADL's : Needs assistance/impaired   Eating/Feeding Details (indicate cue type and reason): declined any eating. states she is waiting because her sister is bringing her chik-fil-a and she is very excited to eat chicken Grooming: Wash/dry face;Oral care;Brushing hair;Set up Grooming Details (indicate cue type and reason): completed sitting in bed;required vc for sequencing  and encouragement for completion                               General ADL Comments: able to complete 3 grooming tasks this session.  did well with specific options and perameters.  wanted to make sure it would not interfere with her tv schedule. once i demonstrated she could do both she felt better with proceeding with tasks.  tolerated bed in semi seated position and stated it "felt good"     Vision       Perception     Praxis      Cognition Arousal/Alertness: Awake/alert Behavior During Therapy: Anxious Overall Cognitive Status: Impaired/Different from baseline Area of Impairment: Memory;Following commands;Safety/judgement;Awareness;Problem solving;Orientation;Attention                   Current Attention Level: Sustained           General Comments: Pt continues with circular reasoning, needing step by step instructions, stalling, coming back to the starting point of the discussion multiple times before being able to move forward.  did well with "this or that" choices this day        Exercises     Shoulder Instructions       General Comments  pt. Reports being very excited because  her sister is coming today and bringing her chik-[employer] for breakfast and she loves chicken    Pertinent Vitals/ Pain       Pain Assessment: No/denies pain  Home Living                                          Prior Functioning/Environment              Frequency  Min 2X/week        Progress Toward Goals  OT Goals(current goals can now be found in the care plan section)  Progress towards OT goals: Progressing toward goals     Plan Discharge plan remains appropriate    Co-evaluation                 AM-PAC OT "6 Clicks" Daily Activity     Outcome Measure   Help from another person eating meals?: A Glasser Help from another person taking care of personal grooming?: A Figgs Help from another person toileting, which includes  using toliet, bedpan, or urinal?: A Line Help from another person bathing (including washing, rinsing, drying)?: A Ivens Help from another person to put on and taking off regular upper body clothing?: A Maddy Help from another person to put on and taking off regular lower body clothing?: A Linch 6 Click Score: 18    End of Session    OT Visit Diagnosis: Unsteadiness on feet (R26.81);Other abnormalities of gait and mobility (R26.89);Muscle weakness (generalized) (M62.81);History of falling (Z91.81);Other symptoms and signs involving cognitive function;Adult, failure to thrive (R62.7)   Activity Tolerance Patient tolerated treatment well   Patient Left in bed;with call bell/phone within reach;Other (comment)   Nurse Communication          [id number] OT Time Calculation (min): 13 min  Charges: OT General Charges $OT Visit: 1 Visit OT Treatments $Self Care/Home Management : 8-22 mins   Tanya Nones, COTA/L 11/24/2018, 9:19 AM

## 2018-11-24 NOTE — Plan of Care (Signed)
  Problem: Coping: Goal: Level of anxiety will decrease Outcome: Progressing   Problem: Activity: Goal: Risk for activity intolerance will decrease Outcome: Not Progressing   

## 2018-11-25 LAB — BASIC METABOLIC PANEL
Anion gap: 10 (ref 5–15)
BUN: 11 mg/dL (ref 8–23)
CO2: 22 mmol/L (ref 22–32)
Calcium: 8.3 mg/dL — ABNORMAL LOW (ref 8.9–10.3)
Chloride: 108 mmol/L (ref 98–111)
Creatinine, Ser: 0.44 mg/dL (ref 0.44–1.00)
GFR calc Af Amer: >60 mL/min (ref >60)
GFR calc non Af Amer: >60 mL/min (ref >60)
Glucose, Bld: 145 mg/dL — ABNORMAL HIGH (ref 70–99)
Potassium: 2.7 mmol/L — CL (ref 3.5–5.1)
Sodium: 140 mmol/L (ref 135–145)

## 2018-11-25 LAB — GLUCOSE, CAPILLARY
Glucose-Capillary: 160 mg/dL — ABNORMAL HIGH (ref 70–99)
Glucose-Capillary: 232 mg/dL — ABNORMAL HIGH (ref 70–99)
Glucose-Capillary: 288 mg/dL — ABNORMAL HIGH (ref 70–99)

## 2018-11-25 LAB — MAGNESIUM: Magnesium: 1.9 mg/dL (ref 1.7–2.4)

## 2018-11-25 MED ORDER — POTASSIUM CHLORIDE 10 MEQ/100ML IV SOLN
10.0000 meq | INTRAVENOUS | Status: AC
  Administered 2018-11-25: 10 meq via INTRAVENOUS
  Filled 2018-11-25: qty 100

## 2018-11-25 MED ORDER — POTASSIUM CHLORIDE 10 MEQ/100ML IV SOLN
10.0000 meq | INTRAVENOUS | Status: AC
  Administered 2018-11-25 (×2): 10 meq via INTRAVENOUS
  Filled 2018-11-25 (×2): qty 100

## 2018-11-25 NOTE — Progress Notes (Signed)
PROGRESS NOTE    Becky Stout  [id number] DOB: 11-Nov-1955 DOA: 11/03/2018 PCP: Abner Greenspan, MD      Brief Narrative:  [name] is a 63 y.o. F with HTN, DM, anemia, obesity and WPW who presented with confusion, fall, and weight loss.  At baseline, patient lives alone. Family brings food to her house on regular basis. She was admitted for DKA, acute CVA, metabolic encephalopathy and unwitnessed fall.      Assessment & Plan:  Failure to thrive The patient lives alone, but has demonstrated cognitive deficits, generalized weakness, and undernourishment here in the hospital subsequently concerned about her being discharged home alone. -Consult nutrition -Continue nutritional supplements    Hypokalemia Hypomagnesemia -Continue potassium supplementation, aggressively  Acute metabolic encephalopathy This was multifactorial from electrolyte abnormalities, dehydration, CVA.  It has improved.  Baseline is unclear.  Hypertensive emergency Blood pressure elevated yesterday -Continue lisinopril, amlodipine  Acute CVA MRI brain on 10/10 showed multiple cerebral and brainstem lacunar infarcts. -Started on aspirin and Plavix for 3 weeks followed by Plavix alone -Continue on statin -PT/OT recommend SNF  Diabetes DKA, resolved Glucoses are improved. -Continue Lantus -Continue sliding scale correction insulin -Continue linagliptin  Hypothyroidism -Continue levothyroxine  Asymptomatic pyuria  History of WPW  Generalized anxiety Agoraphobia -Continue sertraline   MDM and disposition: The below labs and imaging reports were reviewed and summarized above.  Medication management as above.  The patient was admitted with failure to thrive, found to have multiple strokes as a result of dehydration, poor PO intake.  Now improving but we are having to aggressively replete K (her K was 2.2 yesterday and only up to 2.7 today despite aggressive supplementation yesterday).          DVT prophylaxis: Lovenox Code Status: FULL Family Communication:     Consultants:     Procedures:     Antimicrobials:       Subjective: Feeling tired, but no headache, focal weakness, numbness, vertigo, speech deficit.  Objective: Vitals:   11/25/18 0926 11/25/18 1232 11/25/18 1650 11/25/18 1917  BP: 113/79 (!) 159/76 (!) 174/71 (!) 149/66  Pulse: 78 80 83 86  Resp: 16 16 16 18   Temp: 98 F (36.7 C) 99.2 F (37.3 C) 99 F (37.2 C) 98.1 F (36.7 C)  TempSrc: Oral Oral Axillary Oral  SpO2: 96% 96% 96% 97%  Weight:        Intake/Output Summary (Last 24 hours) at 11/25/2018 2011 Last data filed at 11/25/2018 1853 Gross per 24 hour  Intake 2321.67 ml  Output 600 ml  Net 1721.67 ml   Filed Weights   11/21/18 0500 11/22/18 0500 11/25/18 0500  Weight: 95 kg 97 kg 102 kg    Examination: General appearance adult female, alert and in no acute distress.   HEENT: Anicteric, conjunctiva pink, lids and lashes normal. No nasal deformity, discharge, epistaxis.  Lips moist, dentition poor.   Skin: Warm and dry.  No jaundice.  No suspicious rashes or lesions. Cardiac: RRR, nl S1-S2, no murmurs appreciated.  Capillary refill is brisk.  JVP normal.  No LE edema.  Radia  pulses 2+ and symmetric. Respiratory: Normal respiratory rate and rhythm.  CTAB without rales or wheezes. Abdomen: Abdomen soft.  No TTP or guarding. No ascites, distension, hepatosplenomegaly.   MSK: No deformities or effusions. Neuro: Awake and alert.  EOMI, moves all extremities with generalized weakness, symmetric. Speech fluent.    Psych: Sensorium intact and responding to questions, attention normal. Affect blunted.  Judgment and insight appear slightly impaired.    Data Reviewed: I have personally reviewed following labs and imaging studies:  CBC: Recent Labs  Lab 11/23/18 0318 11/24/18 0309  WBC 8.8 8.6  HGB 12.2 12.3  HCT 35.2* 35.3*  MCV 89.1 87.4  PLT 291 [id number]   Basic  Metabolic Panel: Recent Labs  Lab 11/23/18 0318 11/24/18 0309 11/24/18 1652 11/25/18 0434  NA 143 141 140 140  K 2.6* 2.2* 3.0* 2.7*  CL 110 110 110 108  CO2 23 22 21* 22  GLUCOSE 237* 180* 159* 145*  BUN 17 11 10 11   CREATININE 0.55 0.39* 0.44 0.44  CALCIUM 8.7* 8.3* 7.9* 8.3*  MG  --  1.6*  --  1.9   GFR: CrCl cannot be calculated (Unknown ideal weight.). Liver Function Tests: No results for input(s): AST, ALT, ALKPHOS, BILITOT, PROT, ALBUMIN in the last 168 hours. No results for input(s): LIPASE, AMYLASE in the last 168 hours. No results for input(s): AMMONIA in the last 168 hours. Coagulation Profile: No results for input(s): INR, PROTIME in the last 168 hours. Cardiac Enzymes: No results for input(s): CKTOTAL, CKMB, CKMBINDEX, TROPONINI in the last 168 hours. BNP (last 3 results) No results for input(s): PROBNP in the last 8760 hours. HbA1C: No results for input(s): HGBA1C in the last 72 hours. CBG: Recent Labs  Lab 11/24/18 1124 11/24/18 1615 11/24/18 2117 11/25/18 0623 11/25/18 1142  GLUCAP 145* 173* 137* 160* 232*   Lipid Profile: No results for input(s): CHOL, HDL, LDLCALC, TRIG, CHOLHDL, LDLDIRECT in the last 72 hours. Thyroid Function Tests: No results for input(s): TSH, T4TOTAL, FREET4, T3FREE, THYROIDAB in the last 72 hours. Anemia Panel: No results for input(s): VITAMINB12, FOLATE, FERRITIN, TIBC, IRON, RETICCTPCT in the last 72 hours. Urine analysis:    Component Value Date/Time   COLORURINE YELLOW 11/03/2018 0341   APPEARANCEUR CLOUDY (A) 11/03/2018 0341   LABSPEC 1.024 11/03/2018 0341   PHURINE 6.0 11/03/2018 0341   GLUCOSEU >=500 (A) 11/03/2018 0341   HGBUR MODERATE (A) 11/03/2018 0341   HGBUR negative 11/01/2008 1121   BILIRUBINUR NEGATIVE 11/03/2018 0341   BILIRUBINUR neg. 12/21/2012 0933   KETONESUR 80 (A) 11/03/2018 0341   PROTEINUR 30 (A) 11/03/2018 0341   UROBILINOGEN 0.2 12/21/2012 0933   UROBILINOGEN 0.2 11/01/2008 1121    NITRITE NEGATIVE 11/03/2018 0341   LEUKOCYTESUR MODERATE (A) 11/03/2018 0341   Sepsis Labs: @LABRCNTIP (procalcitonin:4,lacticacidven:4)  )No results found for this or any previous visit (from the past 240 hour(s)).       Radiology Studies: No results found.      Scheduled Meds: . amLODipine  10 mg Oral Daily  . aspirin EC  81 mg Oral Daily  . atorvastatin  80 mg Oral q1800  . clopidogrel  75 mg Oral Daily  . enoxaparin (LOVENOX) injection  40 mg Subcutaneous Q24H  . feeding supplement  1 Container Oral QID  . feeding supplement (PRO-STAT SUGAR FREE 64)  30 mL Oral TID  . insulin aspart  0-15 Units Subcutaneous TID WC  . insulin aspart  0-5 Units Subcutaneous QHS  . insulin aspart  4 Units Subcutaneous TID WC  . insulin glargine  10 Units Subcutaneous QHS  . linagliptin  5 mg Oral Daily  . lisinopril  40 mg Oral Daily  . pantoprazole  40 mg Oral Daily  . potassium chloride  20 mEq Oral BID  . sertraline  50 mg Oral Daily  . sodium chloride flush  10-40 mL Intracatheter Q12H  .  thiamine  100 mg Oral Daily   Continuous Infusions:   LOS: 22 days    Time spent: 25 minutes    Edwin Dada, MD [facility] 11/25/2018, 8:11 PM     Please page through [place]:  [email or web address] Password [email or web address] If 7PM-7AM, please contact night-coverage

## 2018-11-25 NOTE — TOC Progression Note (Signed)
Transition of Care [facility]) - Progression Note    Patient Details  Name: Becky Stout MRN: [id number] Date of Birth: [birth year]  Transition of Care [facility]) CM/SW [place], [place] Phone Number: 11/25/2018, 12:16 PM  Clinical Narrative:     No current bed offers.   CSW will continue to follow and assist with disposition planning.   Expected Discharge Plan: [place] Barriers to Discharge: Continued Medical Work up, Inadequate or no insurance  Expected Discharge Plan and Services Expected Discharge Plan: [place] In-house Referral: Clinical Social Work Discharge Planning Services: CM Consult Post Acute Care Choice: [place] arrangements for the past 2 months: Single Family Home                                       Social Determinants of Health (SDOH) Interventions    Readmission Risk Interventions No flowsheet data found.

## 2018-11-26 LAB — CBC
HCT: 33.9 % — ABNORMAL LOW (ref 36.0–46.0)
Hemoglobin: 11.8 g/dL — ABNORMAL LOW (ref 12.0–15.0)
MCH: 30.7 pg (ref 26.0–34.0)
MCHC: 34.8 g/dL (ref 30.0–36.0)
MCV: 88.3 fL (ref 80.0–100.0)
Platelets: 317 10*3/uL (ref 150–400)
RBC: 3.84 MIL/uL — ABNORMAL LOW (ref 3.87–5.11)
RDW: 13.1 % (ref 11.5–15.5)
WBC: 9.7 10*3/uL (ref 4.0–10.5)
nRBC: 0 % (ref 0.0–0.2)

## 2018-11-26 LAB — BASIC METABOLIC PANEL
Anion gap: 12 (ref 5–15)
BUN: 9 mg/dL (ref 8–23)
CO2: 20 mmol/L — ABNORMAL LOW (ref 22–32)
Calcium: 8.5 mg/dL — ABNORMAL LOW (ref 8.9–10.3)
Chloride: 105 mmol/L (ref 98–111)
Creatinine, Ser: 0.48 mg/dL (ref 0.44–1.00)
GFR calc Af Amer: >60 mL/min (ref >60)
GFR calc non Af Amer: >60 mL/min (ref >60)
Glucose, Bld: 176 mg/dL — ABNORMAL HIGH (ref 70–99)
Potassium: 3 mmol/L — ABNORMAL LOW (ref 3.5–5.1)
Sodium: 137 mmol/L (ref 135–145)

## 2018-11-26 LAB — GLUCOSE, CAPILLARY
Glucose-Capillary: 146 mg/dL — ABNORMAL HIGH (ref 70–99)
Glucose-Capillary: 173 mg/dL — ABNORMAL HIGH (ref 70–99)
Glucose-Capillary: 180 mg/dL — ABNORMAL HIGH (ref 70–99)
Glucose-Capillary: 156 mg/dL — ABNORMAL HIGH (ref 70–99)
Glucose-Capillary: 154 mg/dL — ABNORMAL HIGH (ref 70–99)
Glucose-Capillary: 196 mg/dL — ABNORMAL HIGH (ref 70–99)
Glucose-Capillary: 139 mg/dL — ABNORMAL HIGH (ref 70–99)

## 2018-11-26 LAB — MAGNESIUM: Magnesium: 1.7 mg/dL (ref 1.7–2.4)

## 2018-11-26 MED ORDER — POTASSIUM CHLORIDE CRYS ER 20 MEQ PO TBCR
40.0000 meq | EXTENDED_RELEASE_TABLET | Freq: Once | ORAL | Status: AC
  Administered 2018-11-26: 40 meq via ORAL
  Filled 2018-11-26: qty 2

## 2018-11-26 MED ORDER — CARVEDILOL 6.25 MG PO TABS
6.2500 mg | ORAL_TABLET | Freq: Two times a day (BID) | ORAL | Status: DC
  Administered 2018-11-27 – 2019-02-02 (×135): 6.25 mg via ORAL
  Filled 2018-11-26 (×135): qty 1

## 2018-11-26 NOTE — Progress Notes (Signed)
  Speech Language Pathology Treatment: Cognitive-Linquistic  Patient Details Name: Becky Stout MRN: [id number] DOB: Feb 24, 1955 Today's Date: 11/26/2018 Time: 1250-1309 SLP Time Calculation (min) (ACUTE ONLY): 19 min  Assessment / Plan / Recommendation Clinical Impression  Pt was encountered awake/alert, lying reclined in bed.  She was oriented x6 independently and she was able to turn the TV on and off without assistance.  Pt was unable to state how to use her call bell in order to call her RN; therefore, she was re-educated and she verbalized understanding.  Pt independently recalled the STM strategy "write it down" that was discussed in previous tx sessions and she gave 3 practical examples of when she could use the STM strategy in everyday life.  She then completed a short-term recall task in which she repeated 3-6 word lists in a given category (fruit, colors, flowers, etc.) Pt recalled all 3 word sequences with 100% accuracy, 4 word sequences with 67% accuracy, 5 word sequences with 50% accuracy, and 6 word sequences with 33% accuracy.  Accuracy improved given moderate semantic and phonemic cues.  SLP will continue to follow up for cognitive-linguistic treatment per POC.     HPI HPI: 63 y.o. female with Past medical history of B12 deficiency, type II DM not on therapy, HTN, IDA, obesity, WPW syndrome. Patient was brought in secondary to increasing confusion as well as failure to thrive and a fall.  MRI revealed scattered small acute infarcts in both cerebral hemispheres and brainstem and chronic basal ganglia infarcts and moderately advanced cerebral atrophy.      SLP Plan  Continue with current plan of care       Recommendations                   Oral Care Recommendations: Oral care BID Follow up Recommendations: Skilled Nursing facility;24 hour supervision/assistance SLP Visit Diagnosis: Cognitive communication deficit [id number]) Plan: Continue with current plan of  care       Colin Mulders M.S., [place] Office: [phone]           [place] 11/26/2018, 1:14 PM

## 2018-11-26 NOTE — Progress Notes (Signed)
Physical Therapy Treatment Patient Details Name: Becky Stout MRN: [id number] DOB: [birth year] Today's Date: 11/26/2018    History of Present Illness 63 y.o. female with Past medical history of B12 deficiency, type II DM not on therapy, HTN, IDA, obesity, WPW syndrome.Pt dx with acute CVA, MRI reveals multiple cerebral and brainstem lacunar infarcts, acute metabolic encepholopathy, Hypertensive emergency, DKA, agorophobia, N/V, hypokalemia, and FTT.      PT Comments    Pt was able to transfer OOB to chair today given extra time and slow, progressive tries.  She is getting mildly better at initiating and I think that is both her improving and this therapist building rapport with her and how to best cue her to move.  She continues in her circular thinking and needing repeated step by step instructions about what is next.  She remains confused as to why her legs are so weak unable to connect time spent in bed and her stroke with her weakness.  PT will continue to follow acutely for safe mobility progression.   Follow Up Recommendations  SNF     Equipment Recommendations  Wheelchair (measurements PT);Wheelchair cushion (measurements PT);Hospital bed;3in1 (PT);Other (comment)(drop arm 3-in-1 would be helpful)    Recommendations for Other Services   NA     Precautions / Restrictions Precautions Precautions: Fall Precaution Comments: extremely anxious, doesn't want to be touched (if at all possible) and fearful of falling    Mobility  Bed Mobility Overal bed mobility: Needs Assistance Bed Mobility: Supine to Sit;Sit to Supine Rolling: Supervision   Supine to sit: Supervision     General bed mobility comments: supervision for safety, likes to be able to reach two rails at one time, can do it on her own if given extra time to complete, heavy reliance on rails for support.   Transfers Overall transfer level: Needs assistance Equipment used: Rolling walker (2 wheeled) Transfers: Sit  to/from [facility] Sit to Stand: Mod assist Stand pivot transfers: Mod assist       General transfer comment: Mod assist to stand from low bed to RW, took multiple attempts and multiple sits back down due to fatigue and fear.  We finally got close enough and she pivoted just far enough to get to the seat of the drop arm recliner chair and then scoot the rest of the way.  Pt surprised by her knee weakness.    Ambulation/Gait             General Gait Details: unable at this time.     Modified Rankin (Stroke Patients Only) Modified Rankin (Stroke Patients Only) Pre-Morbid Rankin Score: Moderate disability Modified Rankin: Moderately severe disability     Balance Overall balance assessment: Needs assistance Sitting-balance support: Feet supported;Bilateral upper extremity supported Sitting balance-Leahy Scale: Fair Sitting balance - Comments: pt supervision EOB, but holding onto bil rails in sitting with bil UEs   Standing balance support: Bilateral upper extremity supported Standing balance-Leahy Scale: Poor Standing balance comment: needs heavy mod external assist and support from RW.  Attempted to use the steady after demonstration (visual), but it was too new and too scary, so we went back to trying the RW.                             Cognition Arousal/Alertness: Awake/alert Behavior During Therapy: Anxious Overall Cognitive Status: Impaired/Different from baseline Area of Impairment: Memory;Following commands;Safety/judgement;Awareness;Problem solving;Orientation;Attention  Orientation Level: Disoriented to;Time;Situation Current Attention Level: Sustained Memory: Decreased recall of precautions;Decreased short-term memory Following Commands: Follows one step commands with increased time Safety/Judgement: Decreased awareness of safety;Decreased awareness of deficits Awareness: Emergent Problem Solving: Decreased  initiation;Slow processing;Difficulty sequencing;Requires verbal cues;Requires tactile cues General Comments: Initiation is improving as we have gotten a bit into a routine together (rapport with this therapist).  Better inititation and better cues by therapist treating her (she doesn't like the idea of therapy but does like the idea of getting up to the chair).               Pertinent Vitals/Pain Pain Assessment: Faces Faces Pain Scale: Hurts [place] more Pain Location: bil feet discomfort Pain Descriptors / Indicators: Burning Pain Intervention(s): Limited activity within patient's tolerance;Monitored during session;Repositioned           PT Goals (current goals can now be found in the care plan section) Progress towards PT goals: Progressing toward goals    Frequency    Min 3X/week      PT Plan Current plan remains appropriate       AM-PAC PT "6 Clicks" Mobility   Outcome Measure  Help needed turning from your back to your side while in a flat bed without using bedrails?: A [place] Help needed moving from lying on your back to sitting on the side of a flat bed without using bedrails?: A [place] Help needed moving to and from a bed to a chair (including a wheelchair)?: A Lot Help needed standing up from a chair using your arms (e.g., wheelchair or bedside chair)?: A Lot Help needed to walk in hospital room?: Total Help needed climbing 3-5 steps with a railing? : Total 6 Click Score: 12    End of Session Equipment Utilized During Treatment: Gait belt Activity Tolerance: Patient limited by fatigue Patient left: in chair;with call bell/phone within reach;with chair alarm set Nurse Communication: Mobility status(to RN tech) PT Visit Diagnosis: Other abnormalities of gait and mobility (R26.89)     Time: [id number] PT Time Calculation (min) (ACUTE ONLY): 46 min  Charges:  $Therapeutic Activity: 38-52 mins                    Gordie Belvin B. [place], PT, DPT  Acute  Rehabilitation [phone] pager [phone] office  @ [name]: [phone]   11/26/2018, 11:34 AM

## 2018-11-26 NOTE — Progress Notes (Signed)
PROGRESS NOTE    Becky Stout  [id number] DOB: [birth year] DOA: 11/03/2018 PCP: Abner Greenspan, MD      Brief Narrative:  Becky Stout is a 63 y.o. F with HTN, DM, anemia, obesity and WPW who presented with confusion, fall, and weight loss.  At baseline, patient lives alone. Family brings food to her house on regular basis. She was admitted for DKA, acute CVA, metabolic encephalopathy and unwitnessed fall.      Assessment & Plan:  Failure to thrive The patient lives alone, but has demonstrated cognitive deficits, generalized weakness, and undernourishment here in the hospital subsequently concerned about her being discharged home alone. -Consult nutrition -Continue nutritional supplements    Hypokalemia Hypomagnesemia -Continue potassium supplement  Acute metabolic encephalopathy This was multifactorial from electrolyte abnormalities, dehydration, CVA.  It has improved.  Baseline is unclear.  Hypertensive emergency BP elevated -Continue lisinopril, amlodipine -Start carvedilol  Acute CVA MRI brain on 10/10 showed multiple cerebral and brainstem lacunar infarcts. -Started on aspirin and Plavix for 3 weeks followed by Plavix alone -Continue statin -PT/OT recommend SNF  Diabetes DKA, resolved Glucoses controlled -Continue Lantus -Continue sliding scale correction insulin -Continue linagliptin  Hypothyroidism -Continue levothyroxine  Asymptomatic pyuria  History of WPW  Generalized anxiety Agoraphobia -Continue sertraline   MDM and disposition: The below labs and imaging reports reviewed and summarized above.  Medication management as above.   The patient was admitted with failure to thrive, found to have multiple strokes as a result of dehydration, poor PO intake.    She remains weak and unable to independently provide self cares.  I am afraid that discharge to home would be unsafe, and so we will continue current cares as we navigate a safe  discharge plan        DVT prophylaxis: Lovenox Code Status: FULL Family Communication:     Consultants:     Procedures:     Antimicrobials:       Subjective: Weak and tired but no nausea, vomiting, malaise, headache, focal weakness, numbness, vertigo, speech deficit.  Objective: Vitals:   11/26/18 0500 11/26/18 0757 11/26/18 1151 11/26/18 1616  BP:  (!) 177/75 (!) 146/68 (!) 171/77  Pulse:  88 87 91  Resp:  16 16 16   Temp:  98.5 F (36.9 C) 98.2 F (36.8 C) 98.3 F (36.8 C)  TempSrc:  Oral Oral Oral  SpO2:  96% 97% 100%  Weight: 102 kg       Intake/Output Summary (Last 24 hours) at 11/26/2018 1927 Last data filed at 11/26/2018 1808 Gross per 24 hour  Intake 240 ml  Output 450 ml  Net -210 ml   Filed Weights   11/22/18 0500 11/25/18 0500 11/26/18 0500  Weight: 97 kg 102 kg 102 kg    Examination: General appearance overweight adult female, lying in bed, appears debilitated. HEENT: Anicteric, conjunctival pink, lids and lashes normal.  No nasal deformity, discharge, or epistaxis.  Lips moist, dentition poor, oropharynx moist, no oral lesions, hearing normal Skin: Warm and dry.  No jaundice.  No suspicious rashes or lesions. Cardiac: Regular rate and rhythm, no murmurs, JVP normal, no lower extremity edema. Respiratory: Normal respiratory rate and rhythm, lungs clear without rales or wheezes. Abdomen: Abdomen soft without tenderness palpation or guarding, no ascites or distention MSK: No deformities or effusions. Neuro: Awake and alert, extraocular movements intact, moves all extremities with generalized weakness, symmetric discoordination, speech fluent.    Psych: Sensorium intact responding to questions, attention normal, affect blunted,  judgment insight appear impaired    Data Reviewed: I have personally reviewed following labs and imaging studies:  CBC: Recent Labs  Lab 11/23/18 0318 11/24/18 0309 11/26/18 0400  WBC 8.8 8.6 9.7  HGB 12.2  12.3 11.8*  HCT 35.2* 35.3* 33.9*  MCV 89.1 87.4 88.3  PLT 291 267 [id number]   Basic Metabolic Panel: Recent Labs  Lab 11/23/18 0318 11/24/18 0309 11/24/18 1652 11/25/18 0434 11/26/18 0400  NA 143 141 140 140 137  K 2.6* 2.2* 3.0* 2.7* 3.0*  CL 110 110 110 108 105  CO2 23 22 21* 22 20*  GLUCOSE 237* 180* 159* 145* 176*  BUN 17 11 10 11 9   CREATININE 0.55 0.39* 0.44 0.44 0.48  CALCIUM 8.7* 8.3* 7.9* 8.3* 8.5*  MG  --  1.6*  --  1.9 1.7   GFR: CrCl cannot be calculated (Unknown ideal weight.). Liver Function Tests: No results for input(s): AST, ALT, ALKPHOS, BILITOT, PROT, ALBUMIN in the last 168 hours. No results for input(s): LIPASE, AMYLASE in the last 168 hours. No results for input(s): AMMONIA in the last 168 hours. Coagulation Profile: No results for input(s): INR, PROTIME in the last 168 hours. Cardiac Enzymes: No results for input(s): CKTOTAL, CKMB, CKMBINDEX, TROPONINI in the last 168 hours. BNP (last 3 results) No results for input(s): PROBNP in the last 8760 hours. HbA1C: No results for input(s): HGBA1C in the last 72 hours. CBG: Recent Labs  Lab 11/26/18 0705 11/26/18 0848 11/26/18 1139 11/26/18 1625 11/26/18 1802  GLUCAP 173* 180* 156* 154* 196*   Lipid Profile: No results for input(s): CHOL, HDL, LDLCALC, TRIG, CHOLHDL, LDLDIRECT in the last 72 hours. Thyroid Function Tests: No results for input(s): TSH, T4TOTAL, FREET4, T3FREE, THYROIDAB in the last 72 hours. Anemia Panel: No results for input(s): VITAMINB12, FOLATE, FERRITIN, TIBC, IRON, RETICCTPCT in the last 72 hours. Urine analysis:    Component Value Date/Time   COLORURINE YELLOW 11/03/2018 0341   APPEARANCEUR CLOUDY (A) 11/03/2018 0341   LABSPEC 1.024 11/03/2018 0341   PHURINE 6.0 11/03/2018 0341   GLUCOSEU >=500 (A) 11/03/2018 0341   HGBUR MODERATE (A) 11/03/2018 0341   HGBUR negative 11/01/2008 1121   BILIRUBINUR NEGATIVE 11/03/2018 0341   BILIRUBINUR neg. 12/21/2012 0933   KETONESUR 80  (A) 11/03/2018 0341   PROTEINUR 30 (A) 11/03/2018 0341   UROBILINOGEN 0.2 12/21/2012 0933   UROBILINOGEN 0.2 11/01/2008 1121   NITRITE NEGATIVE 11/03/2018 0341   LEUKOCYTESUR MODERATE (A) 11/03/2018 0341   Sepsis Labs: @LABRCNTIP (procalcitonin:4,lacticacidven:4)  )No results found for this or any previous visit (from the past 240 hour(s)).       Radiology Studies: No results found.      Scheduled Meds: . amLODipine  10 mg Oral Daily  . aspirin EC  81 mg Oral Daily  . atorvastatin  80 mg Oral q1800  . clopidogrel  75 mg Oral Daily  . enoxaparin (LOVENOX) injection  40 mg Subcutaneous Q24H  . feeding supplement  1 Container Oral QID  . feeding supplement (PRO-STAT SUGAR FREE 64)  30 mL Oral TID  . insulin aspart  0-15 Units Subcutaneous TID WC  . insulin aspart  0-5 Units Subcutaneous QHS  . insulin aspart  4 Units Subcutaneous TID WC  . insulin glargine  10 Units Subcutaneous QHS  . linagliptin  5 mg Oral Daily  . lisinopril  40 mg Oral Daily  . pantoprazole  40 mg Oral Daily  . sertraline  50 mg Oral Daily  . sodium chloride  flush  10-40 mL Intracatheter Q12H  . thiamine  100 mg Oral Daily   Continuous Infusions:   LOS: 23 days    Time spent: 25 minutes    Edwin Dada, MD [facility] 11/26/2018, 7:27 PM     Please page through [place]:  [email or web address] Password [email or web address] If 7PM-7AM, please contact night-coverage

## 2018-11-27 LAB — BASIC METABOLIC PANEL
Anion gap: 12 (ref 5–15)
BUN: 6 mg/dL — ABNORMAL LOW (ref 8–23)
CO2: 23 mmol/L (ref 22–32)
Calcium: 8.8 mg/dL — ABNORMAL LOW (ref 8.9–10.3)
Chloride: 103 mmol/L (ref 98–111)
Creatinine, Ser: 0.54 mg/dL (ref 0.44–1.00)
GFR calc Af Amer: >60 mL/min (ref >60)
GFR calc non Af Amer: >60 mL/min (ref >60)
Glucose, Bld: 162 mg/dL — ABNORMAL HIGH (ref 70–99)
Potassium: 3 mmol/L — ABNORMAL LOW (ref 3.5–5.1)
Sodium: 138 mmol/L (ref 135–145)

## 2018-11-27 LAB — GLUCOSE, CAPILLARY
Glucose-Capillary: 136 mg/dL — ABNORMAL HIGH (ref 70–99)
Glucose-Capillary: 189 mg/dL — ABNORMAL HIGH (ref 70–99)
Glucose-Capillary: 114 mg/dL — ABNORMAL HIGH (ref 70–99)
Glucose-Capillary: 145 mg/dL — ABNORMAL HIGH (ref 70–99)

## 2018-11-27 LAB — MAGNESIUM: Magnesium: 1.7 mg/dL (ref 1.7–2.4)

## 2018-11-27 MED ORDER — POTASSIUM CHLORIDE CRYS ER 20 MEQ PO TBCR
40.0000 meq | EXTENDED_RELEASE_TABLET | ORAL | Status: AC
  Administered 2018-11-27 (×2): 40 meq via ORAL
  Filled 2018-11-27 (×2): qty 2

## 2018-11-27 MED ORDER — MAGNESIUM SULFATE IN D5W 1-5 GM/100ML-% IV SOLN
1.0000 g | Freq: Once | INTRAVENOUS | Status: AC
  Administered 2018-11-27: 1 g via INTRAVENOUS
  Filled 2018-11-27: qty 100

## 2018-11-27 NOTE — Progress Notes (Signed)
PROGRESS NOTE                                                                                                                                                                                                             Patient Demographics:    Becky Stout, is a 63 y.o. female, DOB - 06/01/1955, [id number]  Admit date - 11/03/2018   Admitting Physician Lavina Hamman, MD  Outpatient Primary MD for the patient is Tower, Wynelle Fanny, MD  LOS - 24    Chief Complaint  Patient presents with   Nausea   Emesis   Altered Mental Status       Brief Narrative   63 y/o obese Female with DM, HTN, WPW, p/w frequent falls, confusion and weight loss. Patient was admitted for DKA, acute CVA and metabolic encephalopathy. Prior to admission she lived alone and family helped her by bringing food.    Subjective:   No overnight events. denies any symptoms   Assessment  & Plan :    Principal Problem:   Failure to thrive in adult Associated generalized weakness with cognitive deficit and malnourished. High risk for fall and unsafe discharge home alone.  Referred to SNF    Active Problems: Hypokalemia  replaced. Mg normal  Hypertensive emergency  BP now stable on amlodipine, lisinopril and coreg added  Acute CVA MRI brain on 10/10 showed multiple cerebral and brainstem lacunar infarcts. -Started on aspirin and Plavix for 3 weeks followed by Plavix alone -Continue statin -PT/OT recommend SNF  DM uncontrolled with DKA On presentation. Resolved. cbg stable on lantus and SSI. a1c 7.9    Hypothyroidism Cont synthroid   anxiety  on sertraline     Code Status : DNR  Family Communication  : none  Disposition Plan  : awaiting SNF  Barriers For Discharge : awaiting SNF  Consults  :  NEURO  Procedures  : mri BRAIN  DVT Prophylaxis  :  Lovenox -   Lab Results  Component Value Date   PLT 317 11/26/2018     Antibiotics  :    Anti-infectives (From admission, onward)   Start     Dose/Rate Route Frequency Ordered Stop   11/03/18 1930  cefTRIAXone (ROCEPHIN) 1 g in sodium chloride 0.9 % 100 mL IVPB  Status:  Discontinued     1  g 200 mL/hr over 30 Minutes Intravenous Every 24 hours 11/03/18 1724 11/06/18 1118   11/03/18 0530  cefTRIAXone (ROCEPHIN) 1 g in sodium chloride 0.9 % 100 mL IVPB     1 g 200 mL/hr over 30 Minutes Intravenous  Once 11/03/18 0525 11/03/18 0629        Objective:   Vitals:   11/26/18 2348 11/27/18 0318 11/27/18 0353 11/27/18 0730  BP: (!) 148/72 140/68  (!) 160/69  Pulse: 78 80  83  Resp: 18 20  16   Temp: 98.5 F (36.9 C) 98.6 F (37 C)  99 F (37.2 C)  TempSrc: Oral Oral  Oral  SpO2: 99% 100%  96%  Weight:   102 kg     Wt Readings from Last 3 Encounters:  11/27/18 102 kg  05/26/17 83.2 kg  06/06/16 85 kg     Intake/Output Summary (Last 24 hours) at 11/27/2018 1136 Last data filed at 11/27/2018 0400 Gross per 24 hour  Intake 240 ml  Output 500 ml  Net -260 ml     Physical Exam  Gen: not in distress HEENT: moist mucosa, supple neck Chest: clear b/l, no added sounds CVS: N S1&S2, no murmurs,  GI: soft, NT, ND, Musculoskeletal: warm, no edema     Data Review:    CBC Recent Labs  Lab 11/23/18 0318 11/24/18 0309 11/26/18 0400  WBC 8.8 8.6 9.7  HGB 12.2 12.3 11.8*  HCT 35.2* 35.3* 33.9*  PLT 291 267 317  MCV 89.1 87.4 88.3  MCH 30.9 30.4 30.7  MCHC 34.7 34.8 34.8  RDW 12.7 12.9 13.1    Chemistries  Recent Labs  Lab 11/24/18 0309 11/24/18 1652 11/25/18 0434 11/26/18 0400 11/27/18 0345  NA 141 140 140 137 138  K 2.2* 3.0* 2.7* 3.0* 3.0*  CL 110 110 108 105 103  CO2 22 21* 22 20* 23  GLUCOSE 180* 159* 145* 176* 162*  BUN 11 10 11 9  6*  CREATININE 0.39* 0.44 0.44 0.48 0.54  CALCIUM 8.3* 7.9* 8.3* 8.5* 8.8*  MG 1.6*  --  1.9 1.7 1.7    ------------------------------------------------------------------------------------------------------------------ No results for input(s): CHOL, HDL, LDLCALC, TRIG, CHOLHDL, LDLDIRECT in the last 72 hours.  Lab Results  Component Value Date   HGBA1C 7.9 (H) 11/03/2018   ------------------------------------------------------------------------------------------------------------------ No results for input(s): TSH, T4TOTAL, T3FREE, THYROIDAB in the last 72 hours.  Invalid input(s): FREET3 ------------------------------------------------------------------------------------------------------------------ No results for input(s): VITAMINB12, FOLATE, FERRITIN, TIBC, IRON, RETICCTPCT in the last 72 hours.  Coagulation profile No results for input(s): INR, PROTIME in the last 168 hours.  No results for input(s): DDIMER in the last 72 hours.  Cardiac Enzymes No results for input(s): CKMB, TROPONINI, MYOGLOBIN in the last 168 hours.  Invalid input(s): CK ------------------------------------------------------------------------------------------------------------------ No results found for: BNP  Inpatient Medications  Scheduled Meds:  amLODipine  10 mg Oral Daily   aspirin EC  81 mg Oral Daily   atorvastatin  80 mg Oral q1800   carvedilol  6.25 mg Oral BID WC   clopidogrel  75 mg Oral Daily   enoxaparin (LOVENOX) injection  40 mg Subcutaneous Q24H   feeding supplement  1 Container Oral QID   feeding supplement (PRO-STAT SUGAR FREE 64)  30 mL Oral TID   insulin aspart  0-15 Units Subcutaneous TID WC   insulin aspart  0-5 Units Subcutaneous QHS   insulin aspart  4 Units Subcutaneous TID WC   insulin glargine  10 Units Subcutaneous QHS   linagliptin  5 mg  Oral Daily   lisinopril  40 mg Oral Daily   pantoprazole  40 mg Oral Daily   sertraline  50 mg Oral Daily   sodium chloride flush  10-40 mL Intracatheter Q12H   thiamine  100 mg Oral Daily   Continuous  Infusions: PRN Meds:.acetaminophen, hydrALAZINE, ondansetron (ZOFRAN) IV, sodium chloride flush  Micro Results No results found for this or any previous visit (from the past 240 hour(s)).  Radiology Reports Ct Head Wo Contrast  Result Date: 11/03/2018 CLINICAL DATA:  Head trauma. Nausea and vomiting. EXAM: CT HEAD WITHOUT CONTRAST TECHNIQUE: Contiguous axial images were obtained from the base of the skull through the vertex without intravenous contrast. COMPARISON:  None. FINDINGS: Brain: No acute intracranial hemorrhage. No focal mass lesion. No CT evidence of acute infarction. No midline shift or mass effect. No hydrocephalus. Basilar cisterns are patent. There are periventricular and subcortical white matter hypodensities. Generalized cortical atrophy. Chronic deep white matter infarction in the RIGHT external capsule Vascular: No hyperdense vessel or unexpected calcification. Skull: Normal. Negative for fracture or focal lesion. Sinuses/Orbits: Paranasal sinuses and mastoid air cells are clear. Orbits are clear. Other: None. IMPRESSION: 1. No acute intracranial findings. 2. Atrophy and white matter microvascular disease. 3. Chronic deep white matter infarction in the RIGHT external capsule. Electronically Signed   By: Suzy Bouchard M.D.   On: 11/03/2018 06:05   Mr Brain Wo Contrast  Result Date: 11/03/2018 CLINICAL DATA:  Altered mental status. EXAM: MRI HEAD WITHOUT CONTRAST TECHNIQUE: Multiplanar, multiecho pulse sequences of the brain and surrounding structures were obtained without intravenous contrast. COMPARISON:  Head CT 11/03/2018 FINDINGS: The examination had to be discontinued prior to completion due to patient confusion and combativeness. Axial and coronal diffusion, sagittal T1, axial T2, and axial FLAIR sequences were obtained and are overall severely motion degraded. Brain: There are scattered small foci of trace diffusion weighted signal hyperintensity in suggestive of acute  infarcts including in the basal ganglia regions bilaterally, thalami, anterior body of the corpus callosum on the left, and pons. No gross intracranial hemorrhage, intracranial mass effect, or extra-axial fluid collection is identified. Moderate cerebral atrophy is advanced for age. Chronic infarcts are present in the basal ganglia bilaterally. Vascular: Major intracranial vascular flow voids are grossly preserved. Skull and upper cervical spine: No destructive skull lesion. Sinuses/Orbits: Unremarkable orbits. Paranasal sinuses and mastoid air cells are clear. Other: None. IMPRESSION: 1. Incomplete, severely motion degraded examination. 2. Scattered small acute infarcts in both cerebral hemispheres and brainstem. 3. Chronic basal ganglia infarcts and moderately advanced cerebral atrophy. Electronically Signed   By: Logan Bores M.D.   On: 11/03/2018 12:48   Dg Chest Port 1 View  Result Date: 11/03/2018 CLINICAL DATA:  Altered mental status EXAM: PORTABLE CHEST 1 VIEW COMPARISON:  None. FINDINGS: Normal mediastinum and cardiac silhouette. Normal pulmonary vasculature. No evidence of effusion, infiltrate, or pneumothorax. No acute bony abnormality. Multiple small external densities in garment. IMPRESSION: No acute cardiopulmonary process. Electronically Signed   By: Suzy Bouchard M.D.   On: 11/03/2018 06:11   Dg Abd Portable 1v  Result Date: 11/03/2018 CLINICAL DATA:  Weakness. EXAM: PORTABLE ABDOMEN - 1 VIEW COMPARISON:  None. FINDINGS: The bowel gas pattern is normal. No radio-opaque calculi or other significant radiographic abnormality are seen. IMPRESSION: Negative. Electronically Signed   By: Marijo Conception M.D.   On: 11/03/2018 09:01   Vas US Carotid (at [place] Only)  Result Date: 11/04/2018 Carotid Arterial Duplex Study Indications:  CVA. Risk Factors:      Hypertension, Diabetes. Other Factors:     CHF. Comparison Study:  No prior study on file for comparison Performing  Technologist: Sharion Dove RVS  Examination Guidelines: A complete evaluation includes B-mode imaging, spectral Doppler, color Doppler, and power Doppler as needed of all accessible portions of each vessel. Bilateral testing is considered an integral part of a complete examination. Limited examinations for reoccurring indications may be performed as noted.  Right Carotid Findings: +----------+--------+--------+--------+------------------+------------------+             PSV cm/s EDV cm/s Stenosis Plaque Description Comments            +----------+--------+--------+--------+------------------+------------------+  CCA Prox   60       10                                   intimal thickening  +----------+--------+--------+--------+------------------+------------------+  CCA Distal 67       11                                   intimal thickening  +----------+--------+--------+--------+------------------+------------------+  ICA Prox   89       13                heterogenous                           +----------+--------+--------+--------+------------------+------------------+  ICA Distal 81       18                                                       +----------+--------+--------+--------+------------------+------------------+  ECA        143      13                                                       +----------+--------+--------+--------+------------------+------------------+ +----------+--------+-------+--------+-------------------+             PSV cm/s EDV cms Describe Arm Pressure (mmHG)  +----------+--------+-------+--------+-------------------+  Subclavian 85                                             +----------+--------+-------+--------+-------------------+ +---------+--------+--+--------+-+  Vertebral PSV cm/s 55 EDV cm/s 7  +---------+--------+--+--------+-+  Left Carotid Findings: +----------+--------+--------+--------+------------------+------------------+             PSV cm/s EDV cm/s Stenosis Plaque  Description Comments            +----------+--------+--------+--------+------------------+------------------+  CCA Prox   69       14                                   intimal thickening  +----------+--------+--------+--------+------------------+------------------+  CCA Distal 69       12  intimal thickening  +----------+--------+--------+--------+------------------+------------------+  ICA Prox   55       11                homogeneous                            +----------+--------+--------+--------+------------------+------------------+  ICA Distal 72       19                                                       +----------+--------+--------+--------+------------------+------------------+  ECA        102      9                                                        +----------+--------+--------+--------+------------------+------------------+ +----------+--------+--------+--------+-------------------+             PSV cm/s EDV cm/s Describe Arm Pressure (mmHG)  +----------+--------+--------+--------+-------------------+  Subclavian 49                                              +----------+--------+--------+--------+-------------------+ +---------+--------+--+--------+-+  Vertebral PSV cm/s 45 EDV cm/s 7  +---------+--------+--+--------+-+  Summary: Right Carotid: The extracranial vessels were near-normal with only minimal wall                thickening or plaque. Left Carotid: The extracranial vessels were near-normal with only minimal wall               thickening or plaque. Vertebrals:  Bilateral vertebral arteries demonstrate antegrade flow. Subclavians: Normal flow hemodynamics were seen in bilateral subclavian              arteries. *See table(s) above for measurements and observations.  Electronically signed by Antony Contras MD on 11/04/2018 at 11:25:08 AM.    Final    Vas [place] Transcranial Doppler  Result Date: 11/05/2018  Transcranial Doppler Indications: Stroke.  Performing Technologist: June Leap RDMS, RVT  Examination Guidelines: A complete evaluation includes B-mode imaging, spectral Doppler, color Doppler, and power Doppler as needed of all accessible portions of each vessel. Bilateral testing is considered an integral part of a complete examination. Limited examinations for reoccurring indications may be performed as noted.  +----------+-------------+----------+-----------+-------+  RIGHT TCD  Right VM (cm) Depth (cm) Pulsatility Comment  +----------+-------------+----------+-----------+-------+  MCA            43.00                   1.47              +----------+-------------+----------+-----------+-------+  ACA           -37.00                   1.62              +----------+-------------+----------+-----------+-------+  Term ICA      -19.00                   1.26              +----------+-------------+----------+-----------+-------+  PCA            33.00                   1.67              +----------+-------------+----------+-----------+-------+  Opthalmic      26.00                   1.71              +----------+-------------+----------+-----------+-------+  ICA siphon     32.00                   1.63              +----------+-------------+----------+-----------+-------+  Vertebral     -23.00                   1.03              +----------+-------------+----------+-----------+-------+  +----------+------------+----------+-----------+--------------+  LEFT TCD   Left VM (cm) Depth (cm) Pulsatility    Comment      +----------+------------+----------+-----------+--------------+  MCA                                            not visualized  +----------+------------+----------+-----------+--------------+  ACA                                            not visualized  +----------+------------+----------+-----------+--------------+  Term ICA                                       not visualized  +----------+------------+----------+-----------+--------------+  PCA            28.00                   1.74                     +----------+------------+----------+-----------+--------------+  Opthalmic     35.00                   1.48                     +----------+------------+----------+-----------+--------------+  ICA siphon    26.00                   1.36                     +----------+------------+----------+-----------+--------------+  Vertebral     -20.00                  1.39                     +----------+------------+----------+-----------+--------------+  +------------+-------+-------+               VM cm/s Comment  +------------+-------+-------+  Prox Basilar -19.00           +------------+-------+-------+ Summary:  Absent left temporal and poor subocccipital window limits evaluation of anterior and posterior cerebral circulation vessels. Low normal mean flow velocities in majority of identified vessels of anterior and posterior cerebral circulations.Globally elevated pulsatility  indices suggest diffus eintracranial atherosclerosis likely. *See table(s) above for measurements and observations.  Diagnosing physician: Antony Contras MD Electronically signed by Antony Contras MD on 11/05/2018 at 3:30:04 PM.    Final     Time Spent in minutes  25   Kerron Sedano M.D on 11/27/2018 at 11:36 AM  Between 7am to 7pm - Pager - [phone]  After 7pm go to [email or web address] - password [email or web address]  [facility] -  Office  [phone]

## 2018-11-28 LAB — GLUCOSE, CAPILLARY
Glucose-Capillary: 172 mg/dL — ABNORMAL HIGH (ref 70–99)
Glucose-Capillary: 209 mg/dL — ABNORMAL HIGH (ref 70–99)
Glucose-Capillary: 122 mg/dL — ABNORMAL HIGH (ref 70–99)
Glucose-Capillary: 149 mg/dL — ABNORMAL HIGH (ref 70–99)

## 2018-11-28 LAB — BASIC METABOLIC PANEL
Anion gap: 10 (ref 5–15)
BUN: 13 mg/dL (ref 8–23)
CO2: 22 mmol/L (ref 22–32)
Calcium: 8.7 mg/dL — ABNORMAL LOW (ref 8.9–10.3)
Chloride: 108 mmol/L (ref 98–111)
Creatinine, Ser: 0.44 mg/dL (ref 0.44–1.00)
GFR calc Af Amer: >60 mL/min (ref >60)
GFR calc non Af Amer: >60 mL/min (ref >60)
Glucose, Bld: 169 mg/dL — ABNORMAL HIGH (ref 70–99)
Potassium: 3.7 mmol/L (ref 3.5–5.1)
Sodium: 140 mmol/L (ref 135–145)

## 2018-11-28 LAB — MAGNESIUM: Magnesium: 2 mg/dL (ref 1.7–2.4)

## 2018-11-28 MED ORDER — ASPIRIN EC 81 MG PO TBEC
81.0000 mg | DELAYED_RELEASE_TABLET | Freq: Every day | ORAL | Status: DC
  Administered 2018-11-29: 81 mg via ORAL
  Filled 2018-11-28: qty 1

## 2018-11-28 NOTE — Progress Notes (Signed)
PROGRESS NOTE                                                                                                                                                                                                             Patient Demographics:    Becky Stout, is a 63 y.o. female, DOB - 1955/06/13, [id number]  Admit date - 11/03/2018   Admitting Physician Lavina Hamman, MD  Outpatient Primary MD for the patient is Tower, Wynelle Fanny, MD  LOS - 2    Chief Complaint  Patient presents with   Nausea   Emesis   Altered Mental Status       Brief Narrative   63 y/o obese Female with DM, HTN, WPW, p/w frequent falls, confusion and weight loss. Patient was admitted for DKA, acute CVA and metabolic encephalopathy. Prior to admission she lived alone and family helped her by bringing food.    Subjective:   denies any symptoms   Assessment  & Plan :    Principal Problem:   Failure to thrive in adult Associated generalized weakness with cognitive deficit and malnourished. High risk for fall and unsafe discharge home alone.  Referred to SNF.     Active Problems: Hypokalemia  replaced. Mg normal  Hypertensive emergency  BP has been stable on amlodipine, lisinopril and after coreg added   Acute CVA MRI brain on 10/10 showed multiple cerebral and brainstem lacunar infarcts. -Started on aspirin and Plavix for 3 weeks followed by Plavix alone ( di/c aspirin after today) -Continue statin -PT/OT recommend SNF  DM uncontrolled with DKA On presentation. Resolved. cbg stable on lantus and SSI. a1c 7.9    Hypothyroidism Cont synthroid   anxiety  on sertraline     Code Status : DNR  Family Communication  : none  Disposition Plan  : awaiting SNF  Barriers For Discharge : awaiting SNF  Consults  :  stroke  Procedures  : MRI brain  DVT Prophylaxis  :  Lovenox -   Lab Results  Component Value Date   PLT  317 11/26/2018    Antibiotics  :    Anti-infectives (From admission, onward)   Start     Dose/Rate Route Frequency Ordered Stop   11/03/18 1930  cefTRIAXone (ROCEPHIN) 1 g in sodium chloride 0.9 % 100 mL IVPB  Status:  Discontinued     1 g 200 mL/hr over 30 Minutes Intravenous Every 24 hours 11/03/18 1724 11/06/18 1118   11/03/18 0530  cefTRIAXone (ROCEPHIN) 1 g in sodium chloride 0.9 % 100 mL IVPB     1 g 200 mL/hr over 30 Minutes Intravenous  Once 11/03/18 0525 11/03/18 0629        Objective:   Vitals:   11/27/18 1925 11/27/18 2323 11/28/18 0319 11/28/18 0345  BP: (!) 146/74 138/65 (!) 147/71   Pulse: 80 70 74   Resp: 18 17 17    Temp: 98.6 F (37 C) 98.4 F (36.9 C) 98.5 F (36.9 C)   TempSrc: Oral Oral Oral   SpO2: 96% 95% 94%   Weight:    102 kg    Wt Readings from Last 3 Encounters:  11/28/18 102 kg  05/26/17 83.2 kg  06/06/16 85 kg     Intake/Output Summary (Last 24 hours) at 11/28/2018 0936 Last data filed at 11/27/2018 1425 Gross per 24 hour  Intake --  Output 500 ml  Net -500 ml    Physical exam:  NAD  HEENT: moist mucosa  chest: clear  CVS: NS1&S2  GI: soft, NT Musculoskeletal: warm     Data Review:    CBC Recent Labs  Lab 11/23/18 0318 11/24/18 0309 11/26/18 0400  WBC 8.8 8.6 9.7  HGB 12.2 12.3 11.8*  HCT 35.2* 35.3* 33.9*  PLT 291 267 317  MCV 89.1 87.4 88.3  MCH 30.9 30.4 30.7  MCHC 34.7 34.8 34.8  RDW 12.7 12.9 13.1    Chemistries  Recent Labs  Lab 11/24/18 0309 11/24/18 1652 11/25/18 0434 11/26/18 0400 11/27/18 0345 11/28/18 0557  NA 141 140 140 137 138 140  K 2.2* 3.0* 2.7* 3.0* 3.0* 3.7  CL 110 110 108 105 103 108  CO2 22 21* 22 20* 23 22  GLUCOSE 180* 159* 145* 176* 162* 169*  BUN 11 10 11 9  6* 13  CREATININE 0.39* 0.44 0.44 0.48 0.54 0.44  CALCIUM 8.3* 7.9* 8.3* 8.5* 8.8* 8.7*  MG 1.6*  --  1.9 1.7 1.7 2.0    ------------------------------------------------------------------------------------------------------------------ No results for input(s): CHOL, HDL, LDLCALC, TRIG, CHOLHDL, LDLDIRECT in the last 72 hours.  Lab Results  Component Value Date   HGBA1C 7.9 (H) 11/03/2018   ------------------------------------------------------------------------------------------------------------------ No results for input(s): TSH, T4TOTAL, T3FREE, THYROIDAB in the last 72 hours.  Invalid input(s): FREET3 ------------------------------------------------------------------------------------------------------------------ No results for input(s): VITAMINB12, FOLATE, FERRITIN, TIBC, IRON, RETICCTPCT in the last 72 hours.  Coagulation profile No results for input(s): INR, PROTIME in the last 168 hours.  No results for input(s): DDIMER in the last 72 hours.  Cardiac Enzymes No results for input(s): CKMB, TROPONINI, MYOGLOBIN in the last 168 hours.  Invalid input(s): CK ------------------------------------------------------------------------------------------------------------------ No results found for: BNP  Inpatient Medications  Scheduled Meds:  amLODipine  10 mg Oral Daily   aspirin EC  81 mg Oral Daily   atorvastatin  80 mg Oral q1800   carvedilol  6.25 mg Oral BID WC   clopidogrel  75 mg Oral Daily   enoxaparin (LOVENOX) injection  40 mg Subcutaneous Q24H   feeding supplement  1 Container Oral QID   feeding supplement (PRO-STAT SUGAR FREE 64)  30 mL Oral TID   insulin aspart  0-15 Units Subcutaneous TID WC   insulin aspart  0-5 Units Subcutaneous QHS   insulin aspart  4 Units Subcutaneous TID WC   insulin glargine  10 Units Subcutaneous QHS   linagliptin  5 mg Oral Daily   lisinopril  40 mg Oral Daily   pantoprazole  40 mg Oral Daily   sertraline  50 mg Oral Daily   sodium chloride flush  10-40 mL Intracatheter Q12H   thiamine  100 mg Oral Daily   Continuous  Infusions: PRN Meds:.acetaminophen, hydrALAZINE, ondansetron (ZOFRAN) IV, sodium chloride flush  Micro Results No results found for this or any previous visit (from the past 240 hour(s)).  Radiology Reports Ct Head Wo Contrast  Result Date: 11/03/2018 CLINICAL DATA:  Head trauma. Nausea and vomiting. EXAM: CT HEAD WITHOUT CONTRAST TECHNIQUE: Contiguous axial images were obtained from the base of the skull through the vertex without intravenous contrast. COMPARISON:  None. FINDINGS: Brain: No acute intracranial hemorrhage. No focal mass lesion. No CT evidence of acute infarction. No midline shift or mass effect. No hydrocephalus. Basilar cisterns are patent. There are periventricular and subcortical white matter hypodensities. Generalized cortical atrophy. Chronic deep white matter infarction in the RIGHT external capsule Vascular: No hyperdense vessel or unexpected calcification. Skull: Normal. Negative for fracture or focal lesion. Sinuses/Orbits: Paranasal sinuses and mastoid air cells are clear. Orbits are clear. Other: None. IMPRESSION: 1. No acute intracranial findings. 2. Atrophy and white matter microvascular disease. 3. Chronic deep white matter infarction in the RIGHT external capsule. Electronically Signed   By: Suzy Bouchard M.D.   On: 11/03/2018 06:05   Mr Brain Wo Contrast  Result Date: 11/03/2018 CLINICAL DATA:  Altered mental status. EXAM: MRI HEAD WITHOUT CONTRAST TECHNIQUE: Multiplanar, multiecho pulse sequences of the brain and surrounding structures were obtained without intravenous contrast. COMPARISON:  Head CT 11/03/2018 FINDINGS: The examination had to be discontinued prior to completion due to patient confusion and combativeness. Axial and coronal diffusion, sagittal T1, axial T2, and axial FLAIR sequences were obtained and are overall severely motion degraded. Brain: There are scattered small foci of trace diffusion weighted signal hyperintensity in suggestive of acute  infarcts including in the basal ganglia regions bilaterally, thalami, anterior body of the corpus callosum on the left, and pons. No gross intracranial hemorrhage, intracranial mass effect, or extra-axial fluid collection is identified. Moderate cerebral atrophy is advanced for age. Chronic infarcts are present in the basal ganglia bilaterally. Vascular: Major intracranial vascular flow voids are grossly preserved. Skull and upper cervical spine: No destructive skull lesion. Sinuses/Orbits: Unremarkable orbits. Paranasal sinuses and mastoid air cells are clear. Other: None. IMPRESSION: 1. Incomplete, severely motion degraded examination. 2. Scattered small acute infarcts in both cerebral hemispheres and brainstem. 3. Chronic basal ganglia infarcts and moderately advanced cerebral atrophy. Electronically Signed   By: Logan Bores M.D.   On: 11/03/2018 12:48   Dg Chest Port 1 View  Result Date: 11/03/2018 CLINICAL DATA:  Altered mental status EXAM: PORTABLE CHEST 1 VIEW COMPARISON:  None. FINDINGS: Normal mediastinum and cardiac silhouette. Normal pulmonary vasculature. No evidence of effusion, infiltrate, or pneumothorax. No acute bony abnormality. Multiple small external densities in garment. IMPRESSION: No acute cardiopulmonary process. Electronically Signed   By: Suzy Bouchard M.D.   On: 11/03/2018 06:11   Dg Abd Portable 1v  Result Date: 11/03/2018 CLINICAL DATA:  Weakness. EXAM: PORTABLE ABDOMEN - 1 VIEW COMPARISON:  None. FINDINGS: The bowel gas pattern is normal. No radio-opaque calculi or other significant radiographic abnormality are seen. IMPRESSION: Negative. Electronically Signed   By: Marijo Conception M.D.   On: 11/03/2018 09:01   Vas US Carotid (at [place] Only)  Result Date: 11/04/2018 Carotid Arterial Duplex Study  Indications:       CVA. Risk Factors:      Hypertension, Diabetes. Other Factors:     CHF. Comparison Study:  No prior study on file for comparison Performing  Technologist: Sharion Dove RVS  Examination Guidelines: A complete evaluation includes B-mode imaging, spectral Doppler, color Doppler, and power Doppler as needed of all accessible portions of each vessel. Bilateral testing is considered an integral part of a complete examination. Limited examinations for reoccurring indications may be performed as noted.  Right Carotid Findings: +----------+--------+--------+--------+------------------+------------------+             PSV cm/s EDV cm/s Stenosis Plaque Description Comments            +----------+--------+--------+--------+------------------+------------------+  CCA Prox   60       10                                   intimal thickening  +----------+--------+--------+--------+------------------+------------------+  CCA Distal 67       11                                   intimal thickening  +----------+--------+--------+--------+------------------+------------------+  ICA Prox   89       13                heterogenous                           +----------+--------+--------+--------+------------------+------------------+  ICA Distal 81       18                                                       +----------+--------+--------+--------+------------------+------------------+  ECA        143      13                                                       +----------+--------+--------+--------+------------------+------------------+ +----------+--------+-------+--------+-------------------+             PSV cm/s EDV cms Describe Arm Pressure (mmHG)  +----------+--------+-------+--------+-------------------+  Subclavian 85                                             +----------+--------+-------+--------+-------------------+ +---------+--------+--+--------+-+  Vertebral PSV cm/s 55 EDV cm/s 7  +---------+--------+--+--------+-+  Left Carotid Findings: +----------+--------+--------+--------+------------------+------------------+             PSV cm/s EDV cm/s Stenosis Plaque  Description Comments            +----------+--------+--------+--------+------------------+------------------+  CCA Prox   69       14                                   intimal thickening  +----------+--------+--------+--------+------------------+------------------+  CCA Distal 69       12  intimal thickening  +----------+--------+--------+--------+------------------+------------------+  ICA Prox   55       11                homogeneous                            +----------+--------+--------+--------+------------------+------------------+  ICA Distal 72       19                                                       +----------+--------+--------+--------+------------------+------------------+  ECA        102      9                                                        +----------+--------+--------+--------+------------------+------------------+ +----------+--------+--------+--------+-------------------+             PSV cm/s EDV cm/s Describe Arm Pressure (mmHG)  +----------+--------+--------+--------+-------------------+  Subclavian 49                                              +----------+--------+--------+--------+-------------------+ +---------+--------+--+--------+-+  Vertebral PSV cm/s 45 EDV cm/s 7  +---------+--------+--+--------+-+  Summary: Right Carotid: The extracranial vessels were near-normal with only minimal wall                thickening or plaque. Left Carotid: The extracranial vessels were near-normal with only minimal wall               thickening or plaque. Vertebrals:  Bilateral vertebral arteries demonstrate antegrade flow. Subclavians: Normal flow hemodynamics were seen in bilateral subclavian              arteries. *See table(s) above for measurements and observations.  Electronically signed by Antony Contras MD on 11/04/2018 at 11:25:08 AM.    Final    Vas [place] Transcranial Doppler  Result Date: 11/05/2018  Transcranial Doppler Indications: Stroke.  Performing Technologist: June Leap RDMS, RVT  Examination Guidelines: A complete evaluation includes B-mode imaging, spectral Doppler, color Doppler, and power Doppler as needed of all accessible portions of each vessel. Bilateral testing is considered an integral part of a complete examination. Limited examinations for reoccurring indications may be performed as noted.  +----------+-------------+----------+-----------+-------+  RIGHT TCD  Right VM (cm) Depth (cm) Pulsatility Comment  +----------+-------------+----------+-----------+-------+  MCA            43.00                   1.47              +----------+-------------+----------+-----------+-------+  ACA           -37.00                   1.62              +----------+-------------+----------+-----------+-------+  Term ICA      -19.00                   1.26              +----------+-------------+----------+-----------+-------+  PCA            33.00                   1.67              +----------+-------------+----------+-----------+-------+  Opthalmic      26.00                   1.71              +----------+-------------+----------+-----------+-------+  ICA siphon     32.00                   1.63              +----------+-------------+----------+-----------+-------+  Vertebral     -23.00                   1.03              +----------+-------------+----------+-----------+-------+  +----------+------------+----------+-----------+--------------+  LEFT TCD   Left VM (cm) Depth (cm) Pulsatility    Comment      +----------+------------+----------+-----------+--------------+  MCA                                            not visualized  +----------+------------+----------+-----------+--------------+  ACA                                            not visualized  +----------+------------+----------+-----------+--------------+  Term ICA                                       not visualized  +----------+------------+----------+-----------+--------------+  PCA            28.00                   1.74                     +----------+------------+----------+-----------+--------------+  Opthalmic     35.00                   1.48                     +----------+------------+----------+-----------+--------------+  ICA siphon    26.00                   1.36                     +----------+------------+----------+-----------+--------------+  Vertebral     -20.00                  1.39                     +----------+------------+----------+-----------+--------------+  +------------+-------+-------+               VM cm/s Comment  +------------+-------+-------+  Prox Basilar -19.00           +------------+-------+-------+ Summary:  Absent left temporal and poor subocccipital window limits evaluation of anterior and posterior cerebral circulation vessels. Low normal mean flow velocities in majority of identified vessels of anterior and posterior cerebral circulations.Globally elevated pulsatility  indices suggest diffus eintracranial atherosclerosis likely. *See table(s) above for measurements and observations.  Diagnosing physician: Antony Contras MD Electronically signed by Antony Contras MD on 11/05/2018 at 3:30:04 PM.    Final     Time Spent in minutes  20   Kenslee Achorn M.D on 11/28/2018 at 9:36 AM  Between 7am to 7pm - Pager - [phone]  After 7pm go to [email or web address] - password [email or web address]  [facility] -  Office  [phone]

## 2018-11-29 LAB — GLUCOSE, CAPILLARY
Glucose-Capillary: 165 mg/dL — ABNORMAL HIGH (ref 70–99)
Glucose-Capillary: 180 mg/dL — ABNORMAL HIGH (ref 70–99)
Glucose-Capillary: 146 mg/dL — ABNORMAL HIGH (ref 70–99)
Glucose-Capillary: 176 mg/dL — ABNORMAL HIGH (ref 70–99)

## 2018-11-29 NOTE — Progress Notes (Signed)
Pt refused lab draw of blood for Basic Metabolic Panel & Magnesium level. Stated too painful. This nurse had attempted to draw labs from midline in RUA, but was unable to despite trying to position the arm in several different ways.

## 2018-11-29 NOTE — Progress Notes (Signed)
Nutrition Follow-up  DOCUMENTATION CODES:   Not applicable  INTERVENTION:  -Continue Boost Breeze po QID, each supplement provides 250 kcal and 9 grams of protein -Continue 30 ml Prostat po TID, each supplement provides 100 kcal and 15 grams of protein  NUTRITION DIAGNOSIS:   Inadequate oral intake related to poor appetite as evidenced by per patient/family report; improving  GOAL:   Patient will meet greater than or equal to 90% of their needs; progressing  MONITOR:   PO intake, Supplement acceptance, Skin, Weight trends, Labs, I & O's  REASON FOR ASSESSMENT:   Consult Assessment of nutrition requirement/status  ASSESSMENT:  RD working remotely.  63 y.o. female with Past medical history of B12 deficiency, type II DM not on therapy, HTN, CHF, IDA, obesity, WPW syndrome.Patient was brought into secondary to increasing confusion as well as failure to thrive and a fall.62 y.o. female with Past medical history of B12 deficiency, type II DM not on therapy, HTN, IDA, obesity, WPW syndrome.Patient was brought into secondary to increasing confusion as well as failure to thrive and a fall. Scattered small acute subcortical infarcts in both cerebral hemispheres and brainstem.  Patient currently consuming 15-100% of meals at this time. She is receiving Boost Breeze and Prostat nutrition supplements and has been  consuming them. SNF placement pending.  Medications and labs reviewed 11/3 Hypokalemia - resolved  Diet Order:   Diet Order            Diet Carb Modified Fluid consistency: Thin; Room service appropriate? Yes  Diet effective now              EDUCATION NEEDS:   Not appropriate for education at this time  Skin:  Skin Assessment: Reviewed RN Assessment  Last BM:  11/01  Height:   Ht Readings from Last 1 Encounters:  05/26/17 5\' 4"  (1.626 m)    Weight:   Wt Readings from Last 1 Encounters:  11/29/18 98 kg    Ideal Body Weight:  54.5 kg  BMI:  Body mass  index is 37.09 kg/m.  Estimated Nutritional Needs:   Kcal:  1800-2000  Protein:  85-100 grams  Fluid:  >/= 1.8 L/day   Lajuan Lines, RD, LDN Clinical Nutrition [place] Telephone [phone] After Hours/Weekend Pager: [phone]

## 2018-11-29 NOTE — Progress Notes (Signed)
[facility]  PROGRESS NOTE  Becky Stout [id number] DOB: 03-06-1955 DOA: 11/03/2018 PCP: Abner Greenspan, MD   Brief HPI:   63 year old female with a history of diabetes mellitus, hypertension, WPW syndrome, frequent falls, confusion, weight loss was admitted for DKA, acute CVA and metabolic encephalopathy.    Subjective   Patient seen and examined, denies chest pain or shortness breath.  No abdominal pain.   Assessment/Plan:     1. Failure to thrive in adult-patient has associated generalized weakness with cognitive deficit and malnourishment.  High risk for fall and unsafe discharge, she lives alone.  Plan to go to skilled nursing facility.  2. Hypokalemia-potassium replaced.  3. Hypertensive emergency-resolved, blood pressure has been stable on amlodipine, lisinopril, Coreg.  4. CVA-MRI brain on 11/03/2018 showed multiple cerebral and brainstem lacunar infarcts.  Neurology saw the patient and recommended patient and started on aspirin Plavix for 3 weeks followed by Plavix alone.  Patient has been on both medications for more than 3 weeks, will discontinue aspirin at this time.  5. Diabetes mellitus, uncontrolled with DKA-patient presented with DKA on presentation, resolved.  Currently CBG is stable.  Continue Lantus, sliding scale insulin with NovoLog.  6. Hypothyroidism-continue Synthroid.     CBG: Recent Labs  Lab 11/28/18 1206 11/28/18 1611 11/28/18 2118 11/29/18 0626 11/29/18 1134  GLUCAP 209* 122* 149* 165* 180*    CBC: Recent Labs  Lab 11/23/18 0318 11/24/18 0309 11/26/18 0400  WBC 8.8 8.6 9.7  HGB 12.2 12.3 11.8*  HCT 35.2* 35.3* 33.9*  MCV 89.1 87.4 88.3  PLT 291 267 [id number]    Basic Metabolic Panel: Recent Labs  Lab 11/24/18 0309 11/24/18 1652 11/25/18 0434 11/26/18 0400 11/27/18 0345 11/28/18 0557  NA 141 140 140 137 138 140  K 2.2* 3.0* 2.7* 3.0* 3.0* 3.7  CL 110 110 108 105 103 108  CO2 22 21* 22 20* 23 22  GLUCOSE 180* 159*  145* 176* 162* 169*  BUN 11 10 11 9  6* 13  CREATININE 0.39* 0.44 0.44 0.48 0.54 0.44  CALCIUM 8.3* 7.9* 8.3* 8.5* 8.8* 8.7*  MG 1.6*  --  1.9 1.7 1.7 2.0     DVT prophylaxis: Lovenox  Code Status: Full code  Family Communication: No family at bedside  Disposition Plan: likely home when medically ready for discharge        BMI  Estimated body mass index is 37.09 kg/m as calculated from the following:   Height as of 05/26/17: 5\' 4"  (1.626 m).   Weight as of this encounter: 98 kg.  Scheduled medications:  . amLODipine  10 mg Oral Daily  . aspirin EC  81 mg Oral Daily  . atorvastatin  80 mg Oral q1800  . carvedilol  6.25 mg Oral BID WC  . clopidogrel  75 mg Oral Daily  . enoxaparin (LOVENOX) injection  40 mg Subcutaneous Q24H  . feeding supplement  1 Container Oral QID  . feeding supplement (PRO-STAT SUGAR FREE 64)  30 mL Oral TID  . insulin aspart  0-15 Units Subcutaneous TID WC  . insulin aspart  0-5 Units Subcutaneous QHS  . insulin aspart  4 Units Subcutaneous TID WC  . insulin glargine  10 Units Subcutaneous QHS  . linagliptin  5 mg Oral Daily  . lisinopril  40 mg Oral Daily  . pantoprazole  40 mg Oral Daily  . sertraline  50 mg Oral Daily  . sodium chloride flush  10-40 mL Intracatheter Q12H  . thiamine  100 mg Oral Daily    Consultants:  Neurology  Procedures:  None   Antibiotics:   Anti-infectives (From admission, onward)   Start     Dose/Rate Route Frequency Ordered Stop   11/03/18 1930  cefTRIAXone (ROCEPHIN) 1 g in sodium chloride 0.9 % 100 mL IVPB  Status:  Discontinued     1 g 200 mL/hr over 30 Minutes Intravenous Every 24 hours 11/03/18 1724 11/06/18 1118   11/03/18 0530  cefTRIAXone (ROCEPHIN) 1 g in sodium chloride 0.9 % 100 mL IVPB     1 g 200 mL/hr over 30 Minutes Intravenous  Once 11/03/18 0525 11/03/18 0629       Objective   Vitals:   11/29/18 0355 11/29/18 0500 11/29/18 0858 11/29/18 1132  BP: (!) 121/104  (!) 146/61 (!)  149/65  Pulse: 85  74 71  Resp: 18  18 18   Temp: 98.7 F (37.1 C)  98.5 F (36.9 C) 98.6 F (37 C)  TempSrc: Oral  Oral Oral  SpO2: 98%  97% 97%  Weight:  98 kg      Intake/Output Summary (Last 24 hours) at 11/29/2018 1156 Last data filed at 11/29/2018 0900 Gross per 24 hour  Intake 70 ml  Output 250 ml  Net -180 ml   Filed Weights   11/27/18 0353 11/28/18 0345 11/29/18 0500  Weight: 102 kg 102 kg 98 kg     Physical Examination:    General: Appears in no acute distress  Cardiovascular: S1-S2, regular, no murmur auscultated  Respiratory: Clear to auscultation bilaterally, no wheezing or crackles  Abdomen: Abdomen is soft, nontender, no organomegaly  Extremities: No edema in the lower extremities  Neurologic: Alert, oriented x3, intact insight and judgment, no focal deficit noted     Data Reviewed: I have personally reviewed following labs and imaging studies    Admission status: Inpatient: Based on patients clinical presentation and evaluation of above clinical data, I have made determination that patient meets Inpatient criteria at this time.   [facility] Pager [phone]. If 7PM-7AM, please contact night-coverage at [email or web address], Office  [phone]  password [place]  11/29/2018, 11:56 AM  LOS: 26 days

## 2018-11-29 NOTE — Progress Notes (Signed)
Physical Therapy Treatment Patient Details Name: Becky Stout MRN: [id number] DOB: 1955-06-23 Today's Date: 11/29/2018    History of Present Illness 63 y.o. female with Past medical history of B12 deficiency, type II DM not on therapy, HTN, IDA, obesity, WPW syndrome.Pt dx with acute CVA, MRI reveals multiple cerebral and brainstem lacunar infarcts, acute metabolic encepholopathy, Hypertensive emergency, DKA, agorophobia, N/V, hypokalemia, and FTT.      PT Comments    Pt was agreeable to sit EOB and "see how I feel".  She reports she needs to rest (despite being in the bed for the better part of the last two days).  She was able to come EOB with supervision and we tried to talk through getting up to the chair before she ultimately decided she was too tired and refused to go any further, returning to supine again.  PT will continue to follow acutely to encourage OOB mobility.     Follow Up Recommendations  SNF     Equipment Recommendations  Wheelchair (measurements PT);Wheelchair cushion (measurements PT);Hospital bed;3in1 (PT);Other (comment);Rolling walker with 5" wheels(drop arm 3-in-1)    Recommendations for Other Services   NA     Precautions / Restrictions Precautions Precautions: Fall Precaution Comments: extremely anxious, doesn't want to be touched (if at all possible) and fearful of falling    Mobility  Bed Mobility Overal bed mobility: Needs Assistance Bed Mobility: Supine to Sit;Sit to Supine     Supine to sit: Supervision Sit to supine: Supervision   General bed mobility comments: Supervision to come up to EOB after coaxing and convincing from PT.  Pt needed mod two person assist to scoot to [facility] once supine.   Transfers                 General transfer comment: Pt reports she feels too tired to attempt OOB to chair today despite therapist's attempts to convince her to get up.    Modified Rankin (Stroke Patients Only) Modified Rankin (Stroke Patients  Only) Pre-Morbid Rankin Score: Moderate disability Modified Rankin: Moderately severe disability     Balance Overall balance assessment: Needs assistance Sitting-balance support: No upper extremity supported;Feet supported Sitting balance-Leahy Scale: Fair Sitting balance - Comments: supervision EOB, pt holding to rails (one or both) most of the time EOB.                                     Cognition Arousal/Alertness: Awake/alert Behavior During Therapy: Anxious Overall Cognitive Status: Impaired/Different from baseline Area of Impairment: Memory;Following commands;Awareness;Safety/judgement;Problem solving                 Orientation Level: Time;Situation Current Attention Level: Sustained Memory: Decreased recall of precautions;Decreased short-term memory Following Commands: Follows one step commands with increased time Safety/Judgement: Decreased awareness of safety;Decreased awareness of deficits Awareness: Emergent Problem Solving: Slow processing;Decreased initiation;Difficulty sequencing;Requires verbal cues;Requires tactile cues General Comments: Pt continues to have poor insight into her condition, why she cannot just sit in the bed and get better, and why her legs won't work.               Pertinent Vitals/Pain Pain Assessment: No/denies pain           PT Goals (current goals can now be found in the care plan section) Acute Rehab PT Goals Patient Stated Goal: to "rest" today Progress towards PT goals: Progressing toward goals    Frequency  Min 3X/week      PT Plan Current plan remains appropriate       AM-PAC PT "6 Clicks" Mobility   Outcome Measure  Help needed turning from your back to your side while in a flat bed without using bedrails?: A Zu[place]a Help needed moving from lying on your back to sitting on the side of a flat bed without using bedrails?: A [place] Help needed moving to and from a bed to a chair (including a  wheelchair)?: A Lot Help needed standing up from a chair using your arms (e.g., wheelchair or bedside chair)?: A Lot Help needed to walk in hospital room?: Total Help needed climbing 3-5 steps with a railing? : Total 6 Click Score: 12    End of Session   Activity Tolerance: Patient limited by fatigue Patient left: in bed;with call bell/phone within reach;with bed alarm set Nurse Communication: Mobility status PT Visit Diagnosis: Other abnormalities of gait and mobility (R26.89)     Time: 1202-1223 PT Time Calculation (min) (ACUTE ONLY): 21 min  Charges:  $Therapeutic Activity: 8-22 mins                    Verdene Lennert, PT, DPT  Acute Rehabilitation [phone] pager #[phone] office  @ [name]: [phone]   11/29/2018, 2:08 PM

## 2018-11-30 LAB — GLUCOSE, CAPILLARY
Glucose-Capillary: 178 mg/dL — ABNORMAL HIGH (ref 70–99)
Glucose-Capillary: 288 mg/dL — ABNORMAL HIGH (ref 70–99)
Glucose-Capillary: 96 mg/dL (ref 70–99)
Glucose-Capillary: 144 mg/dL — ABNORMAL HIGH (ref 70–99)

## 2018-11-30 NOTE — Progress Notes (Signed)
PT Cancellation Note  Patient Details Name: Becky Stout MRN: [id number] DOB: [birth year]   Cancelled Treatment:    Reason Eval/Treat Not Completed: Patient declined, no reason specified.  Pt refused EOB or OOB mobility reporting she needed to "rest".  She did work with OTA this morning (see separate note).  PT will follow up on Monday 12/03/18 for further attempts at progressive mobility.  Thanks,  Verdene Lennert, PT, DPT  Acute Rehabilitation [phone] pager [phone] office  @ [facility]: [phone]     Becky Stout 11/30/2018, 2:30 PM

## 2018-11-30 NOTE — Progress Notes (Signed)
[facility]  PROGRESS NOTE  Becky Stout [id number] DOB: 05-Nov-1955 DOA: 11/03/2018 PCP: Abner Greenspan, MD   Brief HPI:   63 year old female with a history of diabetes mellitus, hypertension, WPW syndrome, frequent falls, confusion, weight loss was admitted for DKA, acute CVA and metabolic encephalopathy.    Subjective   Patient seen and examined, denies chest pain or shortness of breath.  No nausea or vomiting.   Assessment/Plan:     1. Failure to thrive in adult-patient has associated generalized weakness with cognitive deficit and malnourishment.  High risk for fall and unsafe discharge, she lives alone.  Plan to go to skilled nursing facility.  2. Hypokalemia-potassium replaced.  3. Hypertensive emergency-resolved, blood pressure has been stable on amlodipine, lisinopril, Coreg.  4. CVA-MRI brain on 11/03/2018 showed multiple cerebral and brainstem lacunar infarcts.  Neurology saw the patient and recommended patient and started on aspirin Plavix for 3 weeks followed by Plavix alone.  Patient has been on both medications for more than 3 weeks, will discontinue aspirin at this time, as of 11/29/2018.  5. Diabetes mellitus, uncontrolled with DKA-patient presented with DKA on presentation, resolved.  Currently CBG is stable.  Continue Lantus, sliding scale insulin with NovoLog.  6. Hypothyroidism-continue Synthroid.     CBG: Recent Labs  Lab 11/29/18 0626 11/29/18 1134 11/29/18 1624 11/29/18 2119 11/30/18 0613  GLUCAP 165* 180* 146* 176* 178*    CBC: Recent Labs  Lab 11/24/18 0309 11/26/18 0400  WBC 8.6 9.7  HGB 12.3 11.8*  HCT 35.3* 33.9*  MCV 87.4 88.3  PLT 267 [id number]    Basic Metabolic Panel: Recent Labs  Lab 11/24/18 0309 11/24/18 1652 11/25/18 0434 11/26/18 0400 11/27/18 0345 11/28/18 0557  NA 141 140 140 137 138 140  K 2.2* 3.0* 2.7* 3.0* 3.0* 3.7  CL 110 110 108 105 103 108  CO2 22 21* 22 20* 23 22  GLUCOSE 180* 159* 145* 176* 162*  169*  BUN 11 10 11 9  6* 13  CREATININE 0.39* 0.44 0.44 0.48 0.54 0.44  CALCIUM 8.3* 7.9* 8.3* 8.5* 8.8* 8.7*  MG 1.6*  --  1.9 1.7 1.7 2.0     DVT prophylaxis: Lovenox  Code Status: Full code  Family Communication: No family at bedside  Disposition Plan: likely home when medically ready for discharge        BMI  Estimated body mass index is 36.71 kg/m as calculated from the following:   Height as of 05/26/17: 5\' 4"  (1.626 m).   Weight as of this encounter: 97 kg.  Scheduled medications:  . amLODipine  10 mg Oral Daily  . atorvastatin  80 mg Oral q1800  . carvedilol  6.25 mg Oral BID WC  . clopidogrel  75 mg Oral Daily  . enoxaparin (LOVENOX) injection  40 mg Subcutaneous Q24H  . feeding supplement  1 Container Oral QID  . feeding supplement (PRO-STAT SUGAR FREE 64)  30 mL Oral TID  . insulin aspart  0-15 Units Subcutaneous TID WC  . insulin aspart  0-5 Units Subcutaneous QHS  . insulin aspart  4 Units Subcutaneous TID WC  . insulin glargine  10 Units Subcutaneous QHS  . linagliptin  5 mg Oral Daily  . lisinopril  40 mg Oral Daily  . pantoprazole  40 mg Oral Daily  . sertraline  50 mg Oral Daily  . sodium chloride flush  10-40 mL Intracatheter Q12H  . thiamine  100 mg Oral Daily    Consultants:  Neurology  Procedures:  None   Antibiotics:   Anti-infectives (From admission, onward)   Start     Dose/Rate Route Frequency Ordered Stop   11/03/18 1930  cefTRIAXone (ROCEPHIN) 1 g in sodium chloride 0.9 % 100 mL IVPB  Status:  Discontinued     1 g 200 mL/hr over 30 Minutes Intravenous Every 24 hours 11/03/18 1724 11/06/18 1118   11/03/18 0530  cefTRIAXone (ROCEPHIN) 1 g in sodium chloride 0.9 % 100 mL IVPB     1 g 200 mL/hr over 30 Minutes Intravenous  Once 11/03/18 0525 11/03/18 0629       Objective   Vitals:   11/30/18 0028 11/30/18 0433 11/30/18 0500 11/30/18 0753  BP: (!) 146/67 (!) 156/71  (!) 146/72  Pulse: 75 74  72  Resp: 16 16  18   Temp:  98.3 F (36.8 C) 98.4 F (36.9 C)  98.5 F (36.9 C)  TempSrc: Oral Oral  Oral  SpO2: 95% 95%  96%  Weight:   97 kg     Intake/Output Summary (Last 24 hours) at 11/30/2018 1033 Last data filed at 11/30/2018 0433 Gross per 24 hour  Intake -  Output 900 ml  Net -900 ml   Filed Weights   11/28/18 0345 11/29/18 0500 11/30/18 0500  Weight: 102 kg 98 kg 97 kg     Physical Examination:  General-appears in no acute distress Heart-S1-S2, regular, no murmur auscultated Lungs-clear to auscultation bilaterally, no wheezing or crackles auscultated Abdomen-soft, nontender, no organomegaly Extremities-no edema in the lower extremities Neuro-alert, oriented x3, no focal deficit noted   Data Reviewed: I have personally reviewed following labs and imaging studies    Admission status: Inpatient: Based on patients clinical presentation and evaluation of above clinical data, I have made determination that patient meets Inpatient criteria at this time.   [facility] Pager [phone]. If 7PM-7AM, please contact night-coverage at [email or web address], Office  [phone]  password [email or web address]  11/30/2018, 10:33 AM  LOS: 27 days

## 2018-11-30 NOTE — Progress Notes (Signed)
Occupational Therapy Treatment Patient Details Name: Becky Stout MRN: [id number] DOB: [birth year] Today's Date: 11/30/2018    History of present illness 63 y.o. female with Past medical history of B12 deficiency, type II DM not on therapy, HTN, IDA, obesity, WPW syndrome.Pt dx with acute CVA, MRI reveals multiple cerebral and brainstem lacunar infarcts, acute metabolic encepholopathy, Hypertensive emergency, DKA, agorophobia, N/V, hypokalemia, and FTT.     OT comments  Pt. Seen for skilled OT treatment session.  Focus of session was engaging in desired ADL while consistently following one step commands.  Pt. Required max redirection and emotional support but was able to follow one step commands related to self feeding of breakfast for 3 consecutive bites. Performed light rolling L/R as precursor to increasing bed mobility for eob/oob.    Follow Up Recommendations  SNF    Equipment Recommendations  Other (comment)    Recommendations for Other Services      Precautions / Restrictions Precautions Precautions: Fall Precaution Comments: extremely anxious, doesn't want to be touched (if at all possible) and fearful of falling Restrictions Weight Bearing Restrictions: No       Mobility Bed Mobility                  Transfers                      Balance                                           ADL either performed or assessed with clinical judgement   ADL Overall ADL's : Needs assistance/impaired Eating/Feeding: Set up;Sitting;Bed level Eating/Feeding Details (indicate cue type and reason): utilized self feeding of breakfast for functional activity while addressing current command goal.  pt. often distracts herself and will be unable to particpate in a task for follow commands due to perseveration.  today was instructed to eat her grapes. followed one step commands inconsistently and with increased time but was able to eat 3 grapes.  also  followed cues to take a bite of her sausage.  was more willng to once i explained it was chicken sausage.                                   General ADL Comments: focused on following 1 step commands this session during eating breakfast.  pt. very consumed with the loss of her additional honey mustard packets.  contaced dietary to bring her more.  also verified that she was sch. to receive chicken at lunch and dinner also with grapes.     Vision       Perception     Praxis      Cognition Arousal/Alertness: Awake/alert Behavior During Therapy: Anxious   Area of Impairment: Memory;Following commands;Awareness;Safety/judgement;Problem solving                       Following Commands: Follows one step commands with increased time;Follows one step commands inconsistently Safety/Judgement: Decreased awareness of safety;Decreased awareness of deficits   Problem Solving: Slow processing;Decreased initiation;Difficulty sequencing;Requires verbal cues;Requires tactile cues General Comments: max cues for following commands to complete self feeding of grapes.  self distracted with concerns about missing packets of honey mustard.  reasurement and redirection that i was getting her honey  mustard before she could sustain attention to task. this would last long enough for approx. one bite then would resume talking about her concerns about the chicken and honey mustard.  difficult to redirect but was able to consume 3 grapes, 2 bites of chicken sausage.        Exercises Other Exercises Other Exercises: AROM reaching for L/R bedrail as initiation of roll in preparation for bed mobility and eob/oob.  pt. pleased she was able to reach l/r and pull into semi side-lying.  reasurement and praise that she could do it and would be safe with the bed rails in place.   Shoulder Instructions       General Comments      Pertinent Vitals/ Pain       Pain Assessment: No/denies  pain  Home Living                                          Prior Functioning/Environment              Frequency  Min 2X/week        Progress Toward Goals  OT Goals(current goals can now be found in the care plan section)  Progress towards OT goals: Progressing toward goals     Plan Discharge plan remains appropriate    Co-evaluation                 AM-PAC OT "6 Clicks" Daily Activity     Outcome Measure   Help from another person eating meals?: A Mozley Help from another person taking care of personal grooming?: A [place] Help from another person toileting, which includes using toliet, bedpan, or urinal?: A [place] Help from another person bathing (including washing, rinsing, drying)?: A Franey Help from another person to put on and taking off regular upper body clothing?: A Berrey Help from another person to put on and taking off regular lower body clothing?: A Banas 6 Click Score: 18    End of Session    OT Visit Diagnosis: Unsteadiness on feet (R26.81);Other abnormalities of gait and mobility (R26.89);Muscle weakness (generalized) (M62.81);History of falling (Z91.81);Other symptoms and signs involving cognitive function;Adult, failure to thrive (R62.7)   Activity Tolerance Patient tolerated treatment well   Patient Left in bed;with call bell/phone within reach   Nurse Communication          Time: [phone] OT Time Calculation (min): 12 min  Charges: OT General Charges $OT Visit: 1 Visit OT Treatments $Self Care/Home Management : 8-22 mins  Janice Coffin, COTA/L  11/30/2018, 11:00 AM

## 2018-12-01 DIAGNOSIS — E1110 Type 2 diabetes mellitus with ketoacidosis without coma: Principal | ICD-10-CM

## 2018-12-01 LAB — GLUCOSE, CAPILLARY
Glucose-Capillary: 198 mg/dL — ABNORMAL HIGH (ref 70–99)
Glucose-Capillary: 238 mg/dL — ABNORMAL HIGH (ref 70–99)
Glucose-Capillary: 123 mg/dL — ABNORMAL HIGH (ref 70–99)
Glucose-Capillary: 156 mg/dL — ABNORMAL HIGH (ref 70–99)

## 2018-12-01 NOTE — Progress Notes (Signed)
[facility]  PROGRESS NOTE  Becky Stout [id number] DOB: Sep 17, 1955 DOA: 11/03/2018 PCP: Abner Greenspan, MD   Brief HPI:   63 year old female with a history of diabetes mellitus, hypertension, WPW syndrome, frequent falls, confusion, weight loss was admitted for DKA, acute CVA and metabolic encephalopathy.    Subjective   Patient seen and examined, denies chest pain or shortness of breath.  Awaiting skilled nursing facility placement for rehab.   Assessment/Plan:     1. Failure to thrive in adult-patient has associated generalized weakness with cognitive deficit and malnourishment.  High risk for fall and unsafe discharge, she lives alone.  Plan to go to skilled nursing facility.  2. Hypokalemia-potassium replaced.  3. Hypertensive emergency-resolved, blood pressure has been stable on amlodipine, lisinopril, Coreg.  4. CVA-MRI brain on 11/03/2018 showed multiple cerebral and brainstem lacunar infarcts.  Neurology saw the patient and recommended patient and started on aspirin Plavix for 3 weeks followed by Plavix alone.  Patient has been on both medications for more than 3 weeks, aspirin discontinued on 11/29/2018.  5. Diabetes mellitus, uncontrolled with DKA-patient presented with DKA on presentation, resolved.  Currently CBG is stable.  Continue Lantus, sliding scale insulin with NovoLog.  6. Hypothyroidism-continue Synthroid.     CBG: Recent Labs  Lab 11/30/18 0613 11/30/18 1140 11/30/18 1618 11/30/18 2116 12/01/18 0631  GLUCAP 178* 288* 96 144* 198*    CBC: Recent Labs  Lab 11/26/18 0400  WBC 9.7  HGB 11.8*  HCT 33.9*  MCV 88.3  PLT [id number]    Basic Metabolic Panel: Recent Labs  Lab 11/24/18 1652 11/25/18 0434 11/26/18 0400 11/27/18 0345 11/28/18 0557  NA 140 140 137 138 140  K 3.0* 2.7* 3.0* 3.0* 3.7  CL 110 108 105 103 108  CO2 21* 22 20* 23 22  GLUCOSE 159* 145* 176* 162* 169*  BUN 10 11 9  6* 13  CREATININE 0.44 0.44 0.48 0.54 0.44   CALCIUM 7.9* 8.3* 8.5* 8.8* 8.7*  MG  --  1.9 1.7 1.7 2.0     DVT prophylaxis: Lovenox  Code Status: Full code  Family Communication: No family at bedside  Disposition Plan: likely home when medically ready for discharge        BMI  Estimated body mass index is 37.46 kg/m as calculated from the following:   Height as of 05/26/17: 5\' 4"  (1.626 m).   Weight as of this encounter: 99 kg.  Scheduled medications:  . amLODipine  10 mg Oral Daily  . atorvastatin  80 mg Oral q1800  . carvedilol  6.25 mg Oral BID WC  . clopidogrel  75 mg Oral Daily  . enoxaparin (LOVENOX) injection  40 mg Subcutaneous Q24H  . feeding supplement  1 Container Oral QID  . feeding supplement (PRO-STAT SUGAR FREE 64)  30 mL Oral TID  . insulin aspart  0-15 Units Subcutaneous TID WC  . insulin aspart  0-5 Units Subcutaneous QHS  . insulin aspart  4 Units Subcutaneous TID WC  . insulin glargine  10 Units Subcutaneous QHS  . linagliptin  5 mg Oral Daily  . lisinopril  40 mg Oral Daily  . pantoprazole  40 mg Oral Daily  . sertraline  50 mg Oral Daily  . sodium chloride flush  10-40 mL Intracatheter Q12H  . thiamine  100 mg Oral Daily    Consultants:  Neurology  Procedures:  None   Antibiotics:   Anti-infectives (From admission, onward)   Start  Dose/Rate Route Frequency Ordered Stop   11/03/18 1930  cefTRIAXone (ROCEPHIN) 1 g in sodium chloride 0.9 % 100 mL IVPB  Status:  Discontinued     1 g 200 mL/hr over 30 Minutes Intravenous Every 24 hours 11/03/18 1724 11/06/18 1118   11/03/18 0530  cefTRIAXone (ROCEPHIN) 1 g in sodium chloride 0.9 % 100 mL IVPB     1 g 200 mL/hr over 30 Minutes Intravenous  Once 11/03/18 0525 11/03/18 0629       Objective   Vitals:   12/01/18 0019 12/01/18 0336 12/01/18 0437 12/01/18 0829  BP: (!) 143/66  (!) 150/69 (!) 152/70  Pulse: 73  73 72  Resp: 16  17 16   Temp: 98.7 F (37.1 C)  98.5 F (36.9 C) 98.4 F (36.9 C)  TempSrc: Oral  Oral Oral   SpO2: 97%  97% 95%  Weight:  99 kg      Intake/Output Summary (Last 24 hours) at 12/01/2018 0931 Last data filed at 12/01/2018 0751 Gross per 24 hour  Intake 714 ml  Output 350 ml  Net 364 ml   Filed Weights   11/29/18 0500 11/30/18 0500 12/01/18 0336  Weight: 98 kg 97 kg 99 kg     Physical Examination: General-appears in no acute distress Heart-S1-S2, regular, no murmur auscultated Lungs-clear to auscultation bilaterally, no wheezing or crackles auscultated Abdomen-soft, nontender, no organomegaly Extremities-no edema in the lower extremities Neuro-alert, oriented x3, no focal deficit noted  Data Reviewed: I have personally reviewed following labs and imaging studies    Admission status: Inpatient: Based on patients clinical presentation and evaluation of above clinical data, I have made determination that patient meets Inpatient criteria at this time.   [facility] Pager [phone]. If 7PM-7AM, please contact night-coverage at [email or web address], Office  [phone]  password [email or web address]  12/01/2018, 9:31 AM  LOS: 28 days

## 2018-12-02 LAB — GLUCOSE, CAPILLARY
Glucose-Capillary: 179 mg/dL — ABNORMAL HIGH (ref 70–99)
Glucose-Capillary: 212 mg/dL — ABNORMAL HIGH (ref 70–99)
Glucose-Capillary: 115 mg/dL — ABNORMAL HIGH (ref 70–99)
Glucose-Capillary: 211 mg/dL — ABNORMAL HIGH (ref 70–99)

## 2018-12-02 NOTE — TOC Progression Note (Signed)
Transition of Care [facility]) - Progression Note    Patient Details  Name: Becky Stout MRN: [id number] Date of Birth: 08/01/1955  Transition of Care [facility]) CM/SW [place], [place] Phone Number: 12/02/2018, 10:15 AM  Clinical Narrative:     No current bed offers. CSW will continue to follow and assist with disposition planning.    Expected Discharge Plan: [place] Barriers to Discharge: Continued Medical Work up, Inadequate or no insurance  Expected Discharge Plan and Services Expected Discharge Plan: [place] In-house Referral: Clinical Social Work Discharge Planning Services: CM Consult Post Acute Care Choice: [place] arrangements for the past 2 months: Single Family Home                                       Social Determinants of Health (SDOH) Interventions    Readmission Risk Interventions No flowsheet data found.

## 2018-12-02 NOTE — Progress Notes (Addendum)
[facility]  PROGRESS NOTE  Becky Stout [id number] DOB: 04-21-1955 DOA: 11/03/2018 PCP: Abner Greenspan, MD   Brief HPI:   63 year old female with a history of diabetes mellitus, hypertension, WPW syndrome, frequent falls, confusion, weight loss was admitted for DKA, acute CVA and metabolic encephalopathy.    Subjective   Patient seen and examined, denies nausea vomiting.  No abdominal pain.  Awaiting skilled nursing facility placement.    Assessment/Plan:     1. Failure to thrive in adult-patient has associated generalized weakness with cognitive deficit and malnourishment.  High risk for fall and unsafe discharge, she lives alone.  Plan to go to skilled nursing facility.  2. Hypokalemia-potassium replaced.  3. Hypertensive emergency-resolved, blood pressure has been stable on amlodipine, lisinopril, Coreg.  4. CVA-MRI brain on 11/03/2018 showed multiple cerebral and brainstem lacunar infarcts.  Neurology saw the patient and recommended patient and started on aspirin Plavix for 3 weeks followed by Plavix alone.  Patient has been on both medications for more than 3 weeks, aspirin discontinued on 11/29/2018.  5. Diabetes mellitus, uncontrolled with DKA-patient presented with DKA on presentation, resolved.  Currently CBG is stable.  Continue Lantus, sliding scale insulin with NovoLog.  6. Hypothyroidism-continue Synthroid.     CBG: Recent Labs  Lab 12/01/18 0631 12/01/18 1151 12/01/18 1609 12/01/18 2116 12/02/18 0605  GLUCAP 198* 238* 123* 156* 179*    CBC: Recent Labs  Lab 11/26/18 0400  WBC 9.7  HGB 11.8*  HCT 33.9*  MCV 88.3  PLT [id number]    Basic Metabolic Panel: Recent Labs  Lab 11/26/18 0400 11/27/18 0345 11/28/18 0557  NA 137 138 140  K 3.0* 3.0* 3.7  CL 105 103 108  CO2 20* 23 22  GLUCOSE 176* 162* 169*  BUN 9 6* 13  CREATININE 0.48 0.54 0.44  CALCIUM 8.5* 8.8* 8.7*  MG 1.7 1.7 2.0     DVT prophylaxis: Lovenox  Code Status: Full  code  Family Communication: No family at bedside  Disposition Plan: Awaiting to go to skilled nursing facility.        BMI  Estimated body mass index is 37.46 kg/m as calculated from the following:   Height as of 05/26/17: 5\' 4"  (1.626 m).   Weight as of this encounter: 99 kg.  Scheduled medications:  . amLODipine  10 mg Oral Daily  . atorvastatin  80 mg Oral q1800  . carvedilol  6.25 mg Oral BID WC  . clopidogrel  75 mg Oral Daily  . enoxaparin (LOVENOX) injection  40 mg Subcutaneous Q24H  . feeding supplement  1 Container Oral QID  . feeding supplement (PRO-STAT SUGAR FREE 64)  30 mL Oral TID  . insulin aspart  0-15 Units Subcutaneous TID WC  . insulin aspart  0-5 Units Subcutaneous QHS  . insulin aspart  4 Units Subcutaneous TID WC  . insulin glargine  10 Units Subcutaneous QHS  . linagliptin  5 mg Oral Daily  . lisinopril  40 mg Oral Daily  . pantoprazole  40 mg Oral Daily  . sertraline  50 mg Oral Daily  . sodium chloride flush  10-40 mL Intracatheter Q12H  . thiamine  100 mg Oral Daily    Consultants:  Neurology  Procedures:  None   Antibiotics:   Anti-infectives (From admission, onward)   Start     Dose/Rate Route Frequency Ordered Stop   11/03/18 1930  cefTRIAXone (ROCEPHIN) 1 g in sodium chloride 0.9 % 100 mL IVPB  Status:  Discontinued     1 g 200 mL/hr over 30 Minutes Intravenous Every 24 hours 11/03/18 1724 11/06/18 1118   11/03/18 0530  cefTRIAXone (ROCEPHIN) 1 g in sodium chloride 0.9 % 100 mL IVPB     1 g 200 mL/hr over 30 Minutes Intravenous  Once 11/03/18 0525 11/03/18 0629       Objective   Vitals:   12/01/18 1932 12/01/18 2328 12/02/18 0300 12/02/18 0756  BP: (!) 141/62 (!) 123/92 (!) 147/80 (!) 157/77  Pulse: 77 72 77 72  Resp: 18 18 18 18   Temp: 98.3 F (36.8 C) 99.1 F (37.3 C) 98.9 F (37.2 C) 98.4 F (36.9 C)  TempSrc: Oral Oral Oral Oral  SpO2: 98% 95% 96% 97%  Weight:   99 kg     Intake/Output Summary (Last 24  hours) at 12/02/2018 0941 Last data filed at 12/02/2018 0326 Gross per 24 hour  Intake -  Output 550 ml  Net -550 ml   Filed Weights   11/30/18 0500 12/01/18 0336 12/02/18 0300  Weight: 97 kg 99 kg 99 kg     Physical Examination:  General-appears in no acute distress Heart-S1-S2, regular, no murmur auscultated Lungs-clear to auscultation bilaterally, no wheezing or crackles auscultated Abdomen-soft, nontender, no organomegaly Extremities-no edema in the lower extremities Neuro-alert, oriented x3, no focal deficit noted  Data Reviewed: I have personally reviewed following labs and imaging studies    Admission status: Inpatient: Based on patients clinical presentation and evaluation of above clinical data, I have made determination that patient meets Inpatient criteria at this time.   [facility] Pager [phone]. If 7PM-7AM, please contact night-coverage at [email or web address], Office  [phone]  password [email or web address]  12/02/2018, 9:41 AM  LOS: 29 days

## 2018-12-03 ENCOUNTER — Other Ambulatory Visit: Payer: Self-pay

## 2018-12-03 LAB — GLUCOSE, CAPILLARY
Glucose-Capillary: 188 mg/dL — ABNORMAL HIGH (ref 70–99)
Glucose-Capillary: 147 mg/dL — ABNORMAL HIGH (ref 70–99)
Glucose-Capillary: 181 mg/dL — ABNORMAL HIGH (ref 70–99)
Glucose-Capillary: 150 mg/dL — ABNORMAL HIGH (ref 70–99)

## 2018-12-03 NOTE — Progress Notes (Signed)
Physical Therapy Treatment Patient Details Name: Becky Stout MRN: [id number] DOB: 1955/04/19 Today's Date: 12/03/2018    History of Present Illness 63 y.o. female with Past medical history of B12 deficiency, type II DM not on therapy, HTN, IDA, obesity, WPW syndrome.Pt dx with acute CVA, MRI reveals multiple cerebral and brainstem lacunar infarcts, acute metabolic encepholopathy, Hypertensive emergency, DKA, agorophobia, N/V, hypokalemia, and FTT.      PT Comments    Pt was able to transfer OOB to the recliner chair today in record time (this usually takes nearly and hour due to circular cognitive processing and repetition).  Having a consistent therapist helps as there is carryover re: what cues and techniques work to get Becky Stout moving and how she responds best.  She remains appropriate for SNF level rehab at discharge and goals are due next session.  I would like to work towards short distance gait progression.  PT will continue to follow acutely for safe mobility progression.   Follow Up Recommendations  SNF     Equipment Recommendations  Wheelchair (measurements PT);Wheelchair cushion (measurements PT);3in1 (PT);Rolling walker with 5" wheels(drop arm 3 in 1)    Recommendations for Other Services   NA     Precautions / Restrictions Precautions Precautions: Fall Precaution Comments: extremely anxious, doesn't want to be touched (if at all possible) and fearful of falling    Mobility  Bed Mobility Overal bed mobility: Needs Assistance Bed Mobility: Supine to Sit     Supine to sit: Supervision     General bed mobility comments: Supervision for safety, extra time needed to initiate move to EOB, but once pt initiated, she could physically move her legs and progress her trunk to upright sitting with heavy use of bed rails for support.   Transfers Overall transfer level: Needs assistance Equipment used: Rolling walker (2 wheeled) Transfers: Sit to/from [employer] Sit to Stand: +2 physical assistance;Mod assist Stand pivot transfers: +2 physical assistance;Mod assist       General transfer comment: Two person mod assist to stand and take a few pivotal steps to the recliner chair (sitting prematurely) .  Recliner chair drop arm rest left down due to pt's tendancy to sit before turned all the way around.  Once seated, she used her UEs to scoot the rest of the way into the chair.    Ambulation/Gait             General Gait Details: Pt still to weak in the legs to safely progress to gait.           Balance Overall balance assessment: Needs assistance Sitting-balance support: Feet supported;Bilateral upper extremity supported Sitting balance-Leahy Scale: Fair Sitting balance - Comments: supervision EOB, pt holding to rails (one or both) most of the time EOB.    Standing balance support: Bilateral upper extremity supported Standing balance-Leahy Scale: Poor Standing balance comment: Pt needs support from RW and two therapists today.                             Cognition Arousal/Alertness: Awake/alert Behavior During Therapy: Anxious Overall Cognitive Status: Impaired/Different from baseline Area of Impairment: Memory;Following commands;Awareness;Safety/judgement;Problem solving                 Orientation Level: Time;Situation Current Attention Level: Sustained Memory: Decreased recall of precautions;Decreased short-term memory Following Commands: Follows one step commands with increased time;Follows one step commands inconsistently Safety/Judgement: Decreased awareness of safety;Decreased  awareness of deficits Awareness: Emergent Problem Solving: Slow processing;Decreased initiation;Difficulty sequencing;Requires verbal cues;Requires tactile cues General Comments: Cotninued encouragement to mobilize, pt very particular about seqencng of movement and reasons to move.  She does well with basic step by step  instruction, and patience with gentle repetition.        Exercises Other Exercises Other Exercises: Encouraged ankle pumps and LAQ seated with legs down in recliner chair.         Pertinent Vitals/Pain Pain Assessment: No/denies pain Pain Score: 0-No pain           PT Goals (current goals can now be found in the care plan section) Progress towards PT goals: Progressing toward goals    Frequency    Min 3X/week      PT Plan Current plan remains appropriate       AM-PAC PT "6 Clicks" Mobility   Outcome Measure  Help needed turning from your back to your side while in a flat bed without using bedrails?: A [place] Help needed moving from lying on your back to sitting on the side of a flat bed without using bedrails?: A [place] Help needed moving to and from a bed to a chair (including a wheelchair)?: A Lot Help needed standing up from a chair using your arms (e.g., wheelchair or bedside chair)?: A Lot Help needed to walk in hospital room?: Total Help needed climbing 3-5 steps with a railing? : Total 6 Click Score: 12    End of Session   Activity Tolerance: Patient limited by fatigue Patient left: in chair;with call bell/phone within reach;with chair alarm set   PT Visit Diagnosis: Other abnormalities of gait and mobility (R26.89)     Time: [id number] PT Time Calculation (min) (ACUTE ONLY): 18 min  Charges:  $Therapeutic Activity: 8-22 mins                    Verdene Lennert, PT, DPT  Acute Rehabilitation [phone] pager #[phone] office  @ [name]: [phone]   12/03/2018, 12:54 PM

## 2018-12-03 NOTE — Progress Notes (Signed)
Hospitalist progress note  If 7PM-7AM,  night-coverage-look on AMION -prefer pages-not epic chat,please  Becky Stout [id number] DOB: [birth year] DOA: 11/03/2018  PCP: Abner Greenspan, MD   Narrative:  10 white female DM TY 2 reportedly not on meds prior to admission  HTN Wolff-Parkinson-White syndrome Prior hyper menorrhagia, B12 + iron deficiency deficiency Anxiety with possible agoraphobia Admitted 11/03/2018 after being found on floor confused CT x-ray normal Found to be in DKA-further work-up with MRI showed scattered strokes Assessment & Plan: DKA in the setting of diabetes mellitus type 2 not on meds A1c 7.9 this admission CBG 146-188 continue sliding scale, Lantus 10, linagliptin 5 Wolff-Parkinson-White syndrome, HTN Continue Coreg 6.25 twice daily amlodipine 10 lisinopril 40 hydralazine as as needed 10 for systolic above [id number] need to increase dosing in the next several days On monitors with sinus CVA brain Last seen by neurology 10/11 recommending aspirin Plavix and has been transitioned to Plavix monotherapy B12, iron deficiency anemia with hyper menorrhagia Hemoglobin stable 11.8 usual range 11-13 Anxiety and agoraphobia Continue Zoloft 50 daily, psych saw the patient in consultSecondary to anxiety this admission on 10/13 and may benefit from the same Body mass index is 36.71 kg/m.  No family, DNR, Lovenox therapy recommending SNF needs wheelchair cushion 3 and 1 rolling walker 5 feels drop arm 3 and 1  Subjective:  Anxious pleasant sitting in chair Therapies note shows she occasionally requires 1 hour to transfer from bed to chair No specific issues no pain no nausea no vomiting no swelling   Consultants:  Neurology Psychiatry Procedures:   Multiple Antimicrobials:   None at the present time  Objective: Vitals:   12/02/18 1938 12/02/18 2313 12/03/18 0246 12/03/18 0340  BP: (!) 121/58 137/61  (!) 152/67  Pulse: 66 71  70  Resp: 18 18  18   Temp: 98.7  F (37.1 C) 98.6 F (37 C)  97.6 F (36.4 C)  TempSrc: Oral Oral  Oral  SpO2: 97% 98%  96%  Weight:   97 kg     Intake/Output Summary (Last 24 hours) at 12/03/2018 0723 Last data filed at 12/02/2018 1915 Gross per 24 hour  Intake 600 ml  Output -  Net 600 ml   Filed Weights   12/01/18 0336 12/02/18 0300 12/03/18 0246  Weight: 99 kg 99 kg 97 kg    Examination : EOMI anxious no distress S1-S2 no murmur rub or gallop Chest clear no added sound Abdomen soft nontender no rebound no guarding Trace lower extremity edema Anxious.   Data Reviewed: I have personally reviewed following labs and imaging studies  Scheduled Meds: . amLODipine  10 mg Oral Daily  . atorvastatin  80 mg Oral q1800  . carvedilol  6.25 mg Oral BID WC  . clopidogrel  75 mg Oral Daily  . enoxaparin (LOVENOX) injection  40 mg Subcutaneous Q24H  . feeding supplement  1 Container Oral QID  . feeding supplement (PRO-STAT SUGAR FREE 64)  30 mL Oral TID  . insulin aspart  0-15 Units Subcutaneous TID WC  . insulin aspart  0-5 Units Subcutaneous QHS  . insulin aspart  4 Units Subcutaneous TID WC  . insulin glargine  10 Units Subcutaneous QHS  . linagliptin  5 mg Oral Daily  . lisinopril  40 mg Oral Daily  . pantoprazole  40 mg Oral Daily  . sertraline  50 mg Oral Daily  . sodium chloride flush  10-40 mL Intracatheter Q12H  . thiamine  100 mg Oral  Daily   Continuous Infusions:  Radiology Studies: Reviewed images personally in health database    LOS: 30 days   Time spent: Ashley, MD [facility]  12/03/2018, 7:23 AM

## 2018-12-04 LAB — CBC WITH DIFFERENTIAL/PLATELET
Abs Immature Granulocytes: 0.04 10*3/uL (ref 0.00–0.07)
Basophils Absolute: 0 10*3/uL (ref 0.0–0.1)
Basophils Relative: 0 %
Eosinophils Absolute: 0.1 10*3/uL (ref 0.0–0.5)
Eosinophils Relative: 1 %
HCT: 38 % (ref 36.0–46.0)
Hemoglobin: 12.7 g/dL (ref 12.0–15.0)
Immature Granulocytes: 0 %
Lymphocytes Relative: 17 %
Lymphs Abs: 1.5 10*3/uL (ref 0.7–4.0)
MCH: 30.8 pg (ref 26.0–34.0)
MCHC: 33.4 g/dL (ref 30.0–36.0)
MCV: 92 fL (ref 80.0–100.0)
Monocytes Absolute: 0.6 10*3/uL (ref 0.1–1.0)
Monocytes Relative: 7 %
Neutro Abs: 6.7 10*3/uL (ref 1.7–7.7)
Neutrophils Relative %: 75 %
Platelets: UNDETERMINED 10*3/uL (ref 150–400)
RBC: 4.13 MIL/uL (ref 3.87–5.11)
RDW: 13.6 % (ref 11.5–15.5)
WBC: 8.9 10*3/uL (ref 4.0–10.5)
nRBC: 0 % (ref 0.0–0.2)

## 2018-12-04 LAB — BASIC METABOLIC PANEL
Anion gap: 11 (ref 5–15)
BUN: 20 mg/dL (ref 8–23)
CO2: 18 mmol/L — ABNORMAL LOW (ref 22–32)
Calcium: 9 mg/dL (ref 8.9–10.3)
Chloride: 109 mmol/L (ref 98–111)
Creatinine, Ser: 0.52 mg/dL (ref 0.44–1.00)
GFR calc Af Amer: >60 mL/min (ref >60)
GFR calc non Af Amer: >60 mL/min (ref >60)
Glucose, Bld: 172 mg/dL — ABNORMAL HIGH (ref 70–99)
Potassium: 3 mmol/L — ABNORMAL LOW (ref 3.5–5.1)
Sodium: 138 mmol/L (ref 135–145)

## 2018-12-04 LAB — GLUCOSE, CAPILLARY
Glucose-Capillary: 174 mg/dL — ABNORMAL HIGH (ref 70–99)
Glucose-Capillary: 175 mg/dL — ABNORMAL HIGH (ref 70–99)
Glucose-Capillary: 107 mg/dL — ABNORMAL HIGH (ref 70–99)
Glucose-Capillary: 147 mg/dL — ABNORMAL HIGH (ref 70–99)

## 2018-12-04 NOTE — Progress Notes (Signed)
Occupational Therapy Treatment Patient Details Name: Becky Stout MRN: [id number] DOB: [birth year] Today's Date: 12/04/2018    History of present illness 63 y.o. female with Past medical history of B12 deficiency, type II DM not on therapy, HTN, IDA, obesity, WPW syndrome.Pt dx with acute CVA, MRI reveals multiple cerebral and brainstem lacunar infarcts, acute metabolic encepholopathy, Hypertensive emergency, DKA, agorophobia, N/V, hypokalemia, and FTT.     OT comments  Pt demonstrated improvements with following commands and completing transfers in more appropriate time. Pt required minA for stand-pivot transfer from EOB to recliner, continues to decline physical assistance from therapist but allow hands on assistance to descend into chair due to pt sitting prematurely. Pt required max encouragement to brush her hair, brushing 2x before stopping stating "I can't because of this (gesturing to knot in her hair", pt declined assistance from therapist. Pt will continue to benefit from skilled OT services to maximize safety and independence with ADL/IADL and functional mobility. Will continue to follow acutely and progress as tolerated.    Follow Up Recommendations  SNF    Equipment Recommendations  3 in 1 bedside commode    Recommendations for Other Services      Precautions / Restrictions Precautions Precautions: Fall Precaution Comments: extremely anxious, doesn't want to be touched (if at all possible) and fearful of falling Restrictions Weight Bearing Restrictions: No       Mobility Bed Mobility Overal bed mobility: Needs Assistance Bed Mobility: Supine to Sit;Rolling Rolling: Supervision   Supine to sit: Supervision     General bed mobility comments: Supervision for safety, extra time needed to initiate move to EOB, but once pt initiated, she could physically move her legs and progress her trunk to upright sitting with heavy use of bed rails for support.    Transfers Overall transfer level: Needs assistance Equipment used: Rolling walker (2 wheeled) Transfers: Sit to/from [facility] Sit to Stand: Min assist;+2 safety/equipment;+2 physical assistance Stand pivot transfers: Min assist;+2 physical assistance;+2 safety/equipment       General transfer comment: pt declined physical assistance from therapist;transferred from EOB to recliner with minA+2 for hands on assist at end due to pt sitting prematurely;    Balance Overall balance assessment: Needs assistance Sitting-balance support: Feet supported;Bilateral upper extremity supported Sitting balance-Leahy Scale: Fair Sitting balance - Comments: supervision EOB, pt holding to rails (one or both) most of the time EOB.    Standing balance support: Bilateral upper extremity supported Standing balance-Leahy Scale: Poor Standing balance comment: reliant on BUE support on RW                           ADL either performed or assessed with clinical judgement   ADL Overall ADL's : Needs assistance/impaired     Grooming: Brushing hair;Moderate assistance;Maximal assistance Grooming Details (indicate cue type and reason): pt required max vc to comb hair;pt attempted but stopped due to hair being knotted, declined therapist assisting her                 Toilet Transfer: Minimal assistance;Stand-pivot;RW Toilet Transfer Details (indicate cue type and reason): simulated from EOB to recliner;pt declined therapist providing hands on assistance, due to safety therapist had to provide hands on assist to scoot hips due to pt sitting preemptively Toileting- Clothing Manipulation and Hygiene: Total assistance Toileting - Clothing Manipulation Details (indicate cue type and reason): rolled R<>L for posterior pericare     Functional mobility during ADLs: Minimal  assistance;Rolling walker General ADL Comments: pt continues to require vc for sequencing during transfer  and ADL     Vision       Perception     Praxis      Cognition Arousal/Alertness: Awake/alert Behavior During Therapy: Anxious Overall Cognitive Status: No family/caregiver present to determine baseline cognitive functioning Area of Impairment: Following commands;Safety/judgement;Awareness;Problem solving                       Following Commands: Follows one step commands with increased time;Follows one step commands inconsistently Safety/Judgement: Decreased awareness of safety;Decreased awareness of deficits Awareness: Emergent Problem Solving: Slow processing;Decreased initiation;Difficulty sequencing;Requires verbal cues;Requires tactile cues General Comments: pt agreeable to OT session following education about importance of mobility;pt with decreased awareness of safety, declined physical assistance from therapist, sat preemptively, required vc to scoot hips prior to sitting;pt with intense fear of falling, remains with hands on bedrails requesting therapist to leave all 4 bedrails up, pt transitioned to recliner grasping armrests stating "I need to be very careful not to fall"        Exercises     Shoulder Instructions       General Comments vss    Pertinent Vitals/ Pain       Pain Assessment: No/denies pain  Home Living                                          Prior Functioning/Environment              Frequency  Min 2X/week        Progress Toward Goals  OT Goals(current goals can now be found in the care plan section)  Progress towards OT goals: Progressing toward goals  Acute Rehab OT Goals Patient Stated Goal: to "rest" today OT Goal Formulation: With patient Time For Goal Achievement: 12/18/18 Potential to Achieve Goals: Good ADL Goals Pt Will Perform Grooming: with supervision;sitting Pt Will Perform Upper Body Dressing: with set-up;with supervision;sitting Pt Will Perform Lower Body Dressing: with min assist;sit  to/from stand Pt Will Transfer to Toilet: with min assist;stand pivot transfer;bedside commode Additional ADL Goal #1: Pt will follow 1 step commands consistently with min cues Additional ADL Goal #2: Pt will require min cues for sequencing simple, familiar grooming task  Plan Discharge plan remains appropriate    Co-evaluation                 AM-PAC OT "6 Clicks" Daily Activity     Outcome Measure   Help from another person eating meals?: A Mollica Help from another person taking care of personal grooming?: A Caissie Help from another person toileting, which includes using toliet, bedpan, or urinal?: A Verrette Help from another person bathing (including washing, rinsing, drying)?: A Matkins Help from another person to put on and taking off regular upper body clothing?: A Yinger Help from another person to put on and taking off regular lower body clothing?: A Weigand 6 Click Score: 18    End of Session Equipment Utilized During Treatment: Rolling walker  OT Visit Diagnosis: Unsteadiness on feet (R26.81);Other abnormalities of gait and mobility (R26.89);Muscle weakness (generalized) (M62.81);History of falling (Z91.81);Other symptoms and signs involving cognitive function;Adult, failure to thrive (R62.7)   Activity Tolerance Patient tolerated treatment well   Patient Left in chair;with call bell/phone within reach;with chair alarm set   Nurse Communication  Mobility status        Time: 1430-1451 OT Time Calculation (min): 21 min  Charges: OT General Charges $OT Visit: 1 Visit OT Treatments $Self Care/Home Management : 8-22 mins  Dorinda Hill OTR/L Acute Rehabilitation Services Office: [place] 12/04/2018, 3:11 PM

## 2018-12-04 NOTE — Progress Notes (Signed)
Hospitalist progress note  If 7PM-7AM,  night-coverage-look on AMION -prefer pages-not epic chat,please  Becky Stout [id number] DOB: Oct 09, 1955 DOA: 11/03/2018  PCP: Abner Greenspan, MD   Narrative:  54 white female DM TY 2 reportedly not on meds prior to admission  HTN Wolff-Parkinson-White syndrome Prior hyper menorrhagia, B12 + iron deficiency deficiency Anxiety with possible agoraphobia Admitted 11/03/2018 after being found on floor confused CT x-ray normal Found to be in DKA-further work-up with MRI showed scattered strokes Assessment & Plan: DKA in the setting of diabetes mellitus type 2 not on meds A1c 7.9 this admission CBG 146-188 continue sliding scale, Lantus 10, linagliptin 5 Wolff-Parkinson-White syndrome, HTN increasd coreg to 12.5 bid- amlodipine 10 lisinopril 40 hydralazine as as needed 10 for systolic above 0000000 On monitors with sinus CVA brain this admit Last seen by neurology 10/11 recommending aspirin Plavix and has been transitioned to Plavix monotherapy B12, iron deficiency anemia with hyper menorrhagia Hemoglobin stable 11.8 usual range 11-13 Anxiety and agoraphobia Continue Zoloft 50 daily, psych saw the patient in consult Secondary to anxiety this admission on 10/13 and may benefit from the same Body mass index is 35.95 kg/m.  No family, DNR, Lovenox therapy recommending SNF needs wheelchair cushion 3 and 1 rolling walker 5 feels drop arm 3 and 1  Subjective:  Alaying in bed no issue tol diet  no spont c/o   Consultants:  Neurology Psychiatry Procedures:   Multiple Antimicrobials:   None at the present time  Objective: Vitals:   12/04/18 0404 12/04/18 0500 12/04/18 0742 12/04/18 1122  BP: (!) 159/67  (!) 144/59 (!) 142/55  Pulse: 66  70 64  Resp: 18  16 16   Temp: 98 F (36.7 C)  97.9 F (36.6 C) 98.2 F (36.8 C)  TempSrc: Oral  Oral Oral  SpO2: 98%  97% 97%  Weight:  95 kg    Height:        Intake/Output Summary (Last 24 hours) at  12/04/2018 1351 Last data filed at 12/03/2018 2118 Gross per 24 hour  Intake 10 ml  Output -  Net 10 ml   Filed Weights   12/02/18 0300 12/03/18 0246 12/04/18 0500  Weight: 99 kg 97 kg 95 kg    Examination :  EOMI anxious S1-S2 no murmur rub or gallop Chest clear no added sound Abdomen soft nontender no rebound no guarding Trace lower extremity edema  Data Reviewed: I have personally reviewed following labs and imaging studies  Scheduled Meds: . amLODipine  10 mg Oral Daily  . atorvastatin  80 mg Oral q1800  . carvedilol  6.25 mg Oral BID WC  . clopidogrel  75 mg Oral Daily  . enoxaparin (LOVENOX) injection  40 mg Subcutaneous Q24H  . feeding supplement  1 Container Oral QID  . feeding supplement (PRO-STAT SUGAR FREE 64)  30 mL Oral TID  . insulin aspart  0-15 Units Subcutaneous TID WC  . insulin aspart  0-5 Units Subcutaneous QHS  . insulin aspart  4 Units Subcutaneous TID WC  . insulin glargine  10 Units Subcutaneous QHS  . linagliptin  5 mg Oral Daily  . lisinopril  40 mg Oral Daily  . pantoprazole  40 mg Oral Daily  . sertraline  50 mg Oral Daily  . sodium chloride flush  10-40 mL Intracatheter Q12H  . thiamine  100 mg Oral Daily   Continuous Infusions:  Radiology Studies: Reviewed images personally in health database    LOS: 31 days  Time spent: [place], MD [facility]  12/04/2018, 1:51 PM

## 2018-12-05 LAB — GLUCOSE, CAPILLARY
Glucose-Capillary: 165 mg/dL — ABNORMAL HIGH (ref 70–99)
Glucose-Capillary: 180 mg/dL — ABNORMAL HIGH (ref 70–99)
Glucose-Capillary: 132 mg/dL — ABNORMAL HIGH (ref 70–99)
Glucose-Capillary: 122 mg/dL — ABNORMAL HIGH (ref 70–99)

## 2018-12-05 NOTE — Progress Notes (Signed)
Becky Stout  PROGRESS NOTE    Becky Stout  [id number] DOB: 01/01/1956 DOA: 11/03/2018 PCP: Abner Greenspan, MD   Brief Narrative:   63 year old female with a history of diabetes mellitus, hypertension, WPW syndrome, frequent falls, confusion, weight loss was admitted for DKA, acute CVA and metabolic encephalopathy.  11/11: Denies complaints ON.   Assessment & Plan:   Principal Problem:   Failure to thrive in adult Active Problems:   Hypothyroidism   B12 deficiency   WOLFF (WOLFE)-PARKINSON-WHITE (WPW) SYNDROME   Essential hypertension   Hyperlipidemia   Poorly controlled type 2 diabetes mellitus (HCC)   Anxiety   Acute lower UTI   High anion gap metabolic acidosis   DKA (diabetic ketoacidoses) (HCC)   Cerebral thrombosis with cerebral infarction  DKA DM2     - A1c 7.9     - continue SSI, Novolog 4 WM, Lantus 10, linagliptin 5  Wolff-Parkinson-White syndrome HTN     - continue Coreg 6.25 BID, amlodipine 10, lisinopril 40, hydralazine 10 PRN     - tele     - BP accepable for now  CVA brain     - Last seen by neurology 10/11; transitioned to Plavix monotherapy     - PT/OT rec SNF; WC, Drop arm 3n1, RW  Normocytic anemia B12, iron deficiency hyper menorrhagia     - Hgb 11.8     - no evidence of frank bleed  Anxiety and agoraphobia     - continue Zoloft 50 daily  Hypokalemia     - replete, monitor  DVT prophylaxis: lovenox Code Status: DNR Family Communication: None at bedside   Disposition Plan: TBD  Consultants:   Neurology  Pscyhiatry  ROS:  Denies CP, dyspnea, N, V, ab pain . Remainder 10-pt ROS is negative for all not previously mentioned.  Subjective: "Nothing I can think of."  Objective: Vitals:   12/05/18 0007 12/05/18 0346 12/05/18 0454 12/05/18 0758  BP: (!) 128/57 (!) 147/68  (!) 152/72  Pulse: 67 66  65  Resp: 16 16  18   Temp: 98.3 F (36.8 C) 98.5 F (36.9 C)  98.6 F (37 C)  TempSrc: Oral Oral  Oral  SpO2: 94% 96%  97%   Weight:   97 kg   Height:        Intake/Output Summary (Last 24 hours) at 12/05/2018 1147 Last data filed at 12/05/2018 0900 Gross per 24 hour  Intake 130 ml  Output 400 ml  Net -270 ml   Filed Weights   12/03/18 0246 12/04/18 0500 12/05/18 0454  Weight: 97 kg 95 kg 97 kg    Examination:  General: 63 y.o. female resting in bed in NAD Cardiovascular: RRR, +S1, S2, no m/g/r, equal pulses throughout Respiratory: CTABL, no w/r/r, normal WOB GI: BS+, NDNT, no masses noted, no organomegaly noted MSK: No e/c/c Neuro: alert to name, follows commands   Data Reviewed: I have personally reviewed following labs and imaging studies.  CBC: Recent Labs  Lab 12/04/18 0332  WBC 8.9  NEUTROABS 6.7  HGB 12.7  HCT 38.0  MCV 92.0  PLT PLATELET CLUMPS NOTED ON SMEAR, UNABLE TO ESTIMATE   Basic Metabolic Panel: Recent Labs  Lab 12/04/18 0332  NA 138  K 3.0*  CL 109  CO2 18*  GLUCOSE 172*  BUN 20  CREATININE 0.52  CALCIUM 9.0   GFR: Estimated Creatinine Clearance: 83.5 mL/min (by C-G formula based on SCr of 0.52 mg/dL). Liver Function Tests: No results for input(s):  AST, ALT, ALKPHOS, BILITOT, PROT, ALBUMIN in the last 168 hours. No results for input(s): LIPASE, AMYLASE in the last 168 hours. No results for input(s): AMMONIA in the last 168 hours. Coagulation Profile: No results for input(s): INR, PROTIME in the last 168 hours. Cardiac Enzymes: No results for input(s): CKTOTAL, CKMB, CKMBINDEX, TROPONINI in the last 168 hours. BNP (last 3 results) No results for input(s): PROBNP in the last 8760 hours. HbA1C: No results for input(s): HGBA1C in the last 72 hours. CBG: Recent Labs  Lab 12/04/18 0608 12/04/18 1414 12/04/18 1736 12/04/18 2136 12/05/18 0612  GLUCAP 174* 175* 107* 147* 165*   Lipid Profile: No results for input(s): CHOL, HDL, LDLCALC, TRIG, CHOLHDL, LDLDIRECT in the last 72 hours. Thyroid Function Tests: No results for input(s): TSH, T4TOTAL, FREET4,  T3FREE, THYROIDAB in the last 72 hours. Anemia Panel: No results for input(s): VITAMINB12, FOLATE, FERRITIN, TIBC, IRON, RETICCTPCT in the last 72 hours. Sepsis Labs: No results for input(s): PROCALCITON, LATICACIDVEN in the last 168 hours.  No results found for this or any previous visit (from the past 240 hour(s)).    Radiology Studies: No results found.   Scheduled Meds: . amLODipine  10 mg Oral Daily  . atorvastatin  80 mg Oral q1800  . carvedilol  6.25 mg Oral BID WC  . clopidogrel  75 mg Oral Daily  . enoxaparin (LOVENOX) injection  40 mg Subcutaneous Q24H  . feeding supplement  1 Container Oral QID  . feeding supplement (PRO-STAT SUGAR FREE 64)  30 mL Oral TID  . insulin aspart  0-15 Units Subcutaneous TID WC  . insulin aspart  0-5 Units Subcutaneous QHS  . insulin aspart  4 Units Subcutaneous TID WC  . insulin glargine  10 Units Subcutaneous QHS  . linagliptin  5 mg Oral Daily  . lisinopril  40 mg Oral Daily  . pantoprazole  40 mg Oral Daily  . sertraline  50 mg Oral Daily  . sodium chloride flush  10-40 mL Intracatheter Q12H  . thiamine  100 mg Oral Daily   Continuous Infusions:   LOS: 32 days    Time spent: 25 minutes spent in the coordination of care today.    Jonnie Finner, DO [facility] Pager [phone]  If 7PM-7AM, please contact night-coverage [email or web address] Password [facility] 12/05/2018, 11:47 AM

## 2018-12-06 LAB — CBC WITH DIFFERENTIAL/PLATELET
Abs Immature Granulocytes: 0.04 10*3/uL (ref 0.00–0.07)
Basophils Absolute: 0 10*3/uL (ref 0.0–0.1)
Basophils Relative: 0 %
Eosinophils Absolute: 0.1 10*3/uL (ref 0.0–0.5)
Eosinophils Relative: 1 %
HCT: 37.4 % (ref 36.0–46.0)
Hemoglobin: 12.4 g/dL (ref 12.0–15.0)
Immature Granulocytes: 0 %
Lymphocytes Relative: 18 %
Lymphs Abs: 1.7 10*3/uL (ref 0.7–4.0)
MCH: 30.6 pg (ref 26.0–34.0)
MCHC: 33.2 g/dL (ref 30.0–36.0)
MCV: 92.3 fL (ref 80.0–100.0)
Monocytes Absolute: 0.7 10*3/uL (ref 0.1–1.0)
Monocytes Relative: 8 %
Neutro Abs: 6.6 10*3/uL (ref 1.7–7.7)
Neutrophils Relative %: 73 %
Platelets: 276 10*3/uL (ref 150–400)
RBC: 4.05 MIL/uL (ref 3.87–5.11)
RDW: 13.5 % (ref 11.5–15.5)
WBC: 9.2 10*3/uL (ref 4.0–10.5)
nRBC: 0 % (ref 0.0–0.2)

## 2018-12-06 LAB — RENAL FUNCTION PANEL
Albumin: 3 g/dL — ABNORMAL LOW (ref 3.5–5.0)
Anion gap: 12 (ref 5–15)
BUN: 16 mg/dL (ref 8–23)
CO2: 20 mmol/L — ABNORMAL LOW (ref 22–32)
Calcium: 8.9 mg/dL (ref 8.9–10.3)
Chloride: 109 mmol/L (ref 98–111)
Creatinine, Ser: 0.47 mg/dL (ref 0.44–1.00)
GFR calc Af Amer: >60 mL/min (ref >60)
GFR calc non Af Amer: >60 mL/min (ref >60)
Glucose, Bld: 158 mg/dL — ABNORMAL HIGH (ref 70–99)
Phosphorus: 3.8 mg/dL (ref 2.5–4.6)
Potassium: 3 mmol/L — ABNORMAL LOW (ref 3.5–5.1)
Sodium: 141 mmol/L (ref 135–145)

## 2018-12-06 LAB — GLUCOSE, CAPILLARY
Glucose-Capillary: 169 mg/dL — ABNORMAL HIGH (ref 70–99)
Glucose-Capillary: 230 mg/dL — ABNORMAL HIGH (ref 70–99)
Glucose-Capillary: 155 mg/dL — ABNORMAL HIGH (ref 70–99)
Glucose-Capillary: 109 mg/dL — ABNORMAL HIGH (ref 70–99)

## 2018-12-06 LAB — MAGNESIUM: Magnesium: 1.7 mg/dL (ref 1.7–2.4)

## 2018-12-06 MED ORDER — POTASSIUM CHLORIDE CRYS ER 20 MEQ PO TBCR
40.0000 meq | EXTENDED_RELEASE_TABLET | Freq: Two times a day (BID) | ORAL | Status: DC
  Administered 2018-12-06 – 2018-12-09 (×8): 40 meq via ORAL
  Filled 2018-12-06 (×7): qty 2

## 2018-12-06 MED ORDER — BOOST / RESOURCE BREEZE PO LIQD CUSTOM
1.0000 | Freq: Two times a day (BID) | ORAL | Status: DC
  Administered 2018-12-06 – 2019-02-02 (×110): 1 via ORAL
  Filled 2018-12-06 (×10): qty 1

## 2018-12-06 NOTE — Progress Notes (Signed)
PT Cancellation Note  Patient Details Name: Becky Stout MRN: [id number] DOB: 1955/12/14   Cancelled Treatment:    Reason Eval/Treat Not Completed: Patient declined, no reason specified (pt declining therapy session despite max encouragement, stating, "no thank you. And I've had a crazy day.")  [name], PT, DPT Acute Rehabilitation Services Pager [phone] Office [phone]    [name] 12/06/2018, 1:33 PM

## 2018-12-06 NOTE — Progress Notes (Signed)
  Speech Language Pathology Treatment: Cognitive-Linquistic  Patient Details Name: ALIZANDRA CANIPE MRN: [id number] DOB: 1955/05/09 Today's Date: 12/06/2018 Time: 1155-1227 SLP Time Calculation (min) (ACUTE ONLY): 32 min  Assessment / Plan / Recommendation Clinical Impression  Patient in bed and awake when arriving. Today's session focused on problem solving and sequencing steps for set up using functional task of preparing lunch and eating. Pt's lunch tray was present but untouched. Pt was asked if she is ready to eat. She acknowledged her lunch tray, but was hesitant or unable to ask for help. SLP offered to help her get set up. She expressed needing to set up in the bed and repeatedly grateful. After correct positioning, tray table was lowered over pt's bed. Pt began moving all the items off the tray table to her lap. She was not able to communicate verbally she wanted the table removed, but indirectly expressed concern for it being there. Eventually the table was moved beside the bed. There were multiple occassions pt was unable to recall the name of a common object such as cup. She knew she had a pitcher of water on the bedside table, but didn't "have the thing that holds the water". SLP will continue to provide cognitive therapy to address deficits.    HPI HPI: 63 y.o. female with Past medical history of B12 deficiency, type II DM not on therapy, HTN, IDA, obesity, WPW syndrome. Patient was brought in secondary to increasing confusion as well as failure to thrive and a fall.  MRI revealed scattered small acute infarcts in both cerebral hemispheres and brainstem and chronic basal ganglia infarcts and moderately advanced cerebral atrophy.      SLP Plan  Continue with current plan of care                          Plan: Continue with current plan of care       GO                Wynelle Bourgeois., MA CCC-SLP 12/06/2018, 12:28 PM

## 2018-12-06 NOTE — Progress Notes (Signed)
[name]  PROGRESS NOTE    Becky Stout  [id number] DOB: 28-Oct-1955 DOA: 11/03/2018 PCP: Becky Greenspan, MD   Brief Narrative:   63 year old female with a history of diabetes mellitus, hypertension, WPW syndrome, frequent falls, confusion, weight loss was admitted for DKA, acute CVA and metabolic encephalopathy.  11/11: Denies complaints ON. 11/12: K+ is low again this morning. She denies complaints.    Assessment & Plan:   Principal Problem:   Failure to thrive in adult Active Problems:   Hypothyroidism   B12 deficiency   WOLFF (WOLFE)-PARKINSON-WHITE (WPW) SYNDROME   Essential hypertension   Hyperlipidemia   Poorly controlled type 2 diabetes mellitus (HCC)   Anxiety   Acute lower UTI   High anion gap metabolic acidosis   DKA (diabetic ketoacidoses) (HCC)   Cerebral thrombosis with cerebral infarction  DKA DM2     - A1c 7.9     - continue SSI, Novolog 4 WM, Lantus 10, linagliptin 5  Wolff-Parkinson-White syndrome HTN     - continue Coreg 6.25 BID, amlodipine 10, lisinopril 40, hydralazine 10 PRN     - tele     - BP accepable for now  CVA brain     - Last seen by neurology 10/11; transitioned to Plavix monotherapy     - PT/OT rec SNF; WC, Drop arm 3n1, RW  Normocytic anemia B12, iron deficiency hyper menorrhagia     - Hgb stable     - no evidence of frank bleed  Anxiety and agoraphobia     - continue Zoloft 50 daily  Hypokalemia     - replete, monitor  Replace K+. She's doing well otherwise this AM. Working on placement. No changes today.   DVT prophylaxis: lovenox Code Status: DNR   Disposition Plan: TBD  Consultants:   Neurology  Psychiatry  ROS:  Denies CP, dyspnea, N, V. Remainder 10-pt ROS is negative for all not previously mentioned.  Subjective: "Oh, that's good. Thank you."  Objective: Vitals:   12/05/18 1607 12/05/18 2024 12/05/18 2335 12/06/18 0355  BP: 127/75 135/64 138/63 (!) 150/69  Pulse: 65 69 70 69  Resp: 18 17 17  16   Temp: 98.6 F (37 C) 98.6 F (37 C) 98.1 F (36.7 C) 97.9 F (36.6 C)  TempSrc: Oral Oral Oral Oral  SpO2: 96% 96% 97% 99%  Weight:      Height:        Intake/Output Summary (Last 24 hours) at 12/06/2018 [id number] Last data filed at 12/05/2018 1700 Gross per 24 hour  Intake 360 ml  Output 700 ml  Net -340 ml   Filed Weights   12/03/18 0246 12/04/18 0500 12/05/18 0454  Weight: 97 kg 95 kg 97 kg    Examination:  General: 63 y.o. female resting in bed in NAD Cardiovascular: RRR, +S1, S2, no m/g/r, equal pulses throughout Respiratory: CTABL, no w/r/r GI: BS+, NDNT, soft MSK: No e/c/c Neuro: alert to name, follows commands Psyc: calm/cooperative   Data Reviewed: I have personally reviewed following labs and imaging studies.  CBC: Recent Labs  Lab 12/04/18 0332 12/06/18 0319  WBC 8.9 9.2  NEUTROABS 6.7 6.6  HGB 12.7 12.4  HCT 38.0 37.4  MCV 92.0 92.3  PLT PLATELET CLUMPS NOTED ON SMEAR, UNABLE TO ESTIMATE [id number]   Basic Metabolic Panel: Recent Labs  Lab 12/04/18 0332 12/06/18 0319  NA 138 141  K 3.0* 3.0*  CL 109 109  CO2 18* 20*  GLUCOSE 172* 158*  BUN 20 16  CREATININE 0.52 0.47  CALCIUM 9.0 8.9  MG  --  1.7  PHOS  --  3.8   GFR: Estimated Creatinine Clearance: 83.5 mL/min (by C-G formula based on SCr of 0.47 mg/dL). Liver Function Tests: Recent Labs  Lab 12/06/18 0319  ALBUMIN 3.0*   No results for input(s): LIPASE, AMYLASE in the last 168 hours. No results for input(s): AMMONIA in the last 168 hours. Coagulation Profile: No results for input(s): INR, PROTIME in the last 168 hours. Cardiac Enzymes: No results for input(s): CKTOTAL, CKMB, CKMBINDEX, TROPONINI in the last 168 hours. BNP (last 3 results) No results for input(s): PROBNP in the last 8760 hours. HbA1C: No results for input(s): HGBA1C in the last 72 hours. CBG: Recent Labs  Lab 12/05/18 0612 12/05/18 1148 12/05/18 1634 12/05/18 2129 12/06/18 0612  GLUCAP 165* 180* 132* 122*  169*   Lipid Profile: No results for input(s): CHOL, HDL, LDLCALC, TRIG, CHOLHDL, LDLDIRECT in the last 72 hours. Thyroid Function Tests: No results for input(s): TSH, T4TOTAL, FREET4, T3FREE, THYROIDAB in the last 72 hours. Anemia Panel: No results for input(s): VITAMINB12, FOLATE, FERRITIN, TIBC, IRON, RETICCTPCT in the last 72 hours. Sepsis Labs: No results for input(s): PROCALCITON, LATICACIDVEN in the last 168 hours.  No results found for this or any previous visit (from the past 240 hour(s)).    Radiology Studies: No results found.   Scheduled Meds: . amLODipine  10 mg Oral Daily  . atorvastatin  80 mg Oral q1800  . carvedilol  6.25 mg Oral BID WC  . clopidogrel  75 mg Oral Daily  . enoxaparin (LOVENOX) injection  40 mg Subcutaneous Q24H  . feeding supplement  1 Container Oral QID  . feeding supplement (PRO-STAT SUGAR FREE 64)  30 mL Oral TID  . insulin aspart  0-15 Units Subcutaneous TID WC  . insulin aspart  0-5 Units Subcutaneous QHS  . insulin aspart  4 Units Subcutaneous TID WC  . insulin glargine  10 Units Subcutaneous QHS  . linagliptin  5 mg Oral Daily  . lisinopril  40 mg Oral Daily  . pantoprazole  40 mg Oral Daily  . sertraline  50 mg Oral Daily  . sodium chloride flush  10-40 mL Intracatheter Q12H  . thiamine  100 mg Oral Daily   Continuous Infusions:   LOS: 33 days    Time spent: 25 minutes spent in the coordination of care today.    Jonnie Finner, DO [facility] Pager [phone]  If 7PM-7AM, please contact night-coverage [email or web address] Password [facility] 12/06/2018, 7:23 AM

## 2018-12-06 NOTE — Progress Notes (Signed)
Nutrition Follow-up  RD working remotely.   DOCUMENTATION CODES:   Not applicable  INTERVENTION:  - will decrease Boost Breeze from QID to BID. - continue 30 ml prostat TID. - continue to encourage PO intakes.    NUTRITION DIAGNOSIS:   Inadequate oral intake related to poor appetite as evidenced by per patient/family report. -improving  GOAL:   Patient will meet greater than or equal to 90% of their needs -met on average   MONITOR:   PO intake, Supplement acceptance, Skin, Weight trends, Labs, I & O's  ASSESSMENT:   63 y.o. female with Past medical history of B12 deficiency, type II DM not on therapy, HTN, CHF, IDA, obesity, WPW syndrome.Patient was brought into secondary to increasing confusion as well as failure to thrive and a fall.62 y.o. female with Past medical history of B12 deficiency, type II DM not on therapy, HTN, IDA, obesity, WPW syndrome.Patient was brought into secondary to increasing confusion as well as failure to thrive and a fall. Scattered small acute subcortical infarcts in both cerebral hemispheres and brainstem.  Weight has been fairly stable over the past 1 week and she has been eating 50-100% of meals during that time. Per review of orders, she has been accepting prostat >90% of the time offered and boost breeze ~75% of the time offered.   Per notes: - DKA on admission - Wolff-Parkinson-White syndrome--medications ordered - CVA--last seen by Neuro on 10/11 - PT and OT recommending SNF - normocytic anemia, hypermenorrhagia--no evidence of bleeding at this time and Hgb stable - hypokalemia    Labs reviewed; CBG: 169 mg/dl, K: 3 mmol/l, HgbA1c: 7.9%. Medications reviewed; sliding scale novolog, 4 units novolog TID, 10 units lantus/day, 5 mg tradjenta/day, 40 mg oral protonix/day, 100 mg oral thiamine/day.     Diet Order:   Diet Order            Diet Carb Modified Fluid consistency: Thin; Room service appropriate? Yes  Diet effective now               EDUCATION NEEDS:   Not appropriate for education at this time  Skin:  Skin Assessment: Reviewed RN Assessment  Last BM:  11/9  Height:   Ht Readings from Last 1 Encounters:  12/03/18 5' 4" (1.626 m)    Weight:   Wt Readings from Last 1 Encounters:  12/05/18 97 kg    Ideal Body Weight:  54.5 kg  BMI:  Body mass index is 36.71 kg/m.  Estimated Nutritional Needs:   Kcal:  1800-2000  Protein:  85-100 grams  Fluid:  >/= 1.8 L/day     Jarome Matin, MS, RD, LDN, [facility] Inpatient Clinical Dietitian Pager # [phone] After hours/weekend pager # [phone]

## 2018-12-07 LAB — GLUCOSE, CAPILLARY
Glucose-Capillary: 176 mg/dL — ABNORMAL HIGH (ref 70–99)
Glucose-Capillary: 323 mg/dL — ABNORMAL HIGH (ref 70–99)
Glucose-Capillary: 101 mg/dL — ABNORMAL HIGH (ref 70–99)
Glucose-Capillary: 240 mg/dL — ABNORMAL HIGH (ref 70–99)

## 2018-12-07 NOTE — Progress Notes (Signed)
Pt is refusing labs. No blood return in midline. MD has been notified. RN will continue to monitor.

## 2018-12-07 NOTE — Progress Notes (Signed)
Physical Therapy Treatment Patient Details Name: Becky Stout MRN: [id number] DOB: 1955-01-25 Today's Date: 12/07/2018    History of Present Illness 63 y.o. female with Past medical history of B12 deficiency, type II DM not on therapy, HTN, IDA, obesity, WPW syndrome.Pt dx with acute CVA, MRI reveals multiple cerebral and brainstem lacunar infarcts, acute metabolic encepholopathy, Hypertensive emergency, DKA, agorophobia, N/V, hypokalemia, and FTT.      PT Comments    Pt supine in bed and required max cues for encouragement.  She remains extremely fearful of falling and resistive to assistance until she feels comfortable.  Once in standing she was found to be incontinent of bowel and bladder.  She was informed of this and refused to stand back up for pericare.  Based on need to clean patient to maintain skin integirty she was rolled in recliner and new pad, purewick and underwear was placed,  Pt content in chair posit session.  She is requiring min to mod assistance for transfers and gt training and will benefit from SNF placement at d/c.   Follow Up Recommendations  SNF     Equipment Recommendations  Wheelchair (measurements PT);Wheelchair cushion (measurements PT);3in1 (PT);Rolling walker with 5" wheels    Recommendations for Other Services       Precautions / Restrictions Precautions Precautions: Fall Precaution Comments: extremely anxious, doesn't want to be touched (if at all possible) and fearful of falling Restrictions Weight Bearing Restrictions: No    Mobility  Bed Mobility Overal bed mobility: Needs Assistance Bed Mobility: Supine to Sit Rolling: Supervision   Supine to sit: Supervision     General bed mobility comments: Supervision for safety.  She required increased time but no physical assistance.  Transfers Overall transfer level: Needs assistance Equipment used: Rolling walker (2 wheeled) Transfers: Sit to/from Stand Sit to Stand: Mod assist;+2  safety/equipment         General transfer comment: Pt required moderate assistance to boost into standing after innability to follow commands to scoot to edge of bed to improve ease,  She also refused to allow PTA to raise height of bed so stood from a relatively low surface.  Ambulation/Gait Ambulation/Gait assistance: Min assist;+2 safety/equipment Gait Distance (Feet): 6 Feet Assistive device: Rolling walker (2 wheeled) Gait Pattern/deviations: Step-to pattern;Shuffle;Trunk flexed     General Gait Details: Pt with flex shuffling pattern from bed to recliner chair. Once in standing presented with urinary and bowel incontinence and refused to stand to change her pad a underwear.   Stairs             Wheelchair Mobility    Modified Rankin (Stroke Patients Only)       Balance Overall balance assessment: Needs assistance Sitting-balance support: Feet supported;Bilateral upper extremity supported Sitting balance-Leahy Scale: Fair       Standing balance-Leahy Scale: Poor Standing balance comment: reliant on BUE support on RW                            Cognition Arousal/Alertness: Awake/alert Behavior During Therapy: Anxious;Agitated(agitated and anxious one minute then calm and thanking staff for help the next.) Overall Cognitive Status: No family/caregiver present to determine baseline cognitive functioning Area of Impairment: Following commands;Safety/judgement;Awareness;Problem solving                 Orientation Level: Time;Situation Current Attention Level: Sustained Memory: Decreased recall of precautions;Decreased short-term memory Following Commands: Follows one step commands with increased time;Follows one step  commands inconsistently Safety/Judgement: Decreased awareness of safety;Decreased awareness of deficits Awareness: Emergent Problem Solving: Slow processing;Decreased initiation;Difficulty sequencing;Requires verbal cues;Requires  tactile cues General Comments: Intense fear of falling through out and requires lengthy explantion before attempting mobility.      Exercises      General Comments        Pertinent Vitals/Pain Pain Assessment: Faces Faces Pain Scale: Hurts [place] more Pain Location: generalized discomfort. Pain Descriptors / Indicators: Burning Pain Intervention(s): Monitored during session;Repositioned    Home Living                      Prior Function            PT Goals (current goals can now be found in the care plan section) Acute Rehab PT Goals Patient Stated Goal: to "rest" today Potential to Achieve Goals: Fair Progress towards PT goals: Progressing toward goals    Frequency    Min 3X/week      PT Plan Current plan remains appropriate    Co-evaluation              AM-PAC PT "6 Clicks" Mobility   Outcome Measure  Help needed turning from your back to your side while in a flat bed without using bedrails?: A Doan Help needed moving from lying on your back to sitting on the side of a flat bed without using bedrails?: A [place]attner Help needed moving to and from a bed to a chair (including a wheelchair)?: A Lot Help needed standing up from a chair using your arms (e.g., wheelchair or bedside chair)?: A Lot Help needed to walk in hospital room?: A Lot Help needed climbing 3-5 steps with a railing? : A Lot 6 Click Score: 14    End of Session Equipment Utilized During Treatment: Gait belt Activity Tolerance: Patient limited by fatigue Patient left: in chair;with call bell/phone within reach;with chair alarm set Nurse Communication: Mobility status PT Visit Diagnosis: Other abnormalities of gait and mobility (R26.89)     Time: [id number] PT Time Calculation (min) (ACUTE ONLY): 31 min  Charges:  $Gait Training: 8-22 mins $Therapeutic Activity: 8-22 mins                     Erasmo Leventhal , PTA Acute Rehabilitation Services Pager [phone] Office  [phone]     Becky Stout 12/07/2018, 2:28 PM

## 2018-12-07 NOTE — Progress Notes (Signed)
[name]  PROGRESS NOTE    Becky Stout  [id number] DOB: 30-Oct-1955 DOA: 11/03/2018 PCP: Abner Greenspan, MD   Brief Narrative:   63 year old female with a history of diabetes mellitus, hypertension, WPW syndrome, frequent falls, confusion, weight loss was admitted for DKA, acute CVA and metabolic encephalopathy.  11/11: Denies complaints ON. 11/12: K+ is low again this morning. She denies complaints.  11/13: Refusing labs; but other no acute events. Working on placement.    Assessment & Plan:   Principal Problem:   Failure to thrive in adult Active Problems:   Hypothyroidism   B12 deficiency   WOLFF (WOLFE)-PARKINSON-WHITE (WPW) SYNDROME   Essential hypertension   Hyperlipidemia   Poorly controlled type 2 diabetes mellitus (HCC)   Anxiety   Acute lower UTI   High anion gap metabolic acidosis   DKA (diabetic ketoacidoses) (HCC)   Cerebral thrombosis with cerebral infarction  DKA DM2 - A1c 7.9 - continue SSI, Novolog 4 WM, Lantus 10, linagliptin 5, carb control diet     - sugars w/ occasional spikes; monitor  Wolff-Parkinson-White syndrome HTN - continue Coreg 6.25 BID, amlodipine 10, lisinopril 40, hydralazine 10 PRN - tele - BP accepable for now  CVA brain - Last seen by neurology 10/11; transitioned to Plavix monotherapy - PT/OT rec SNF; WC, Drop arm 3n1, RW  Normocytic anemia B12, iron deficiency hyper menorrhagia     - Hgb stable - no evidence of frank bleed  Anxiety and agoraphobia - continue Zoloft 50 daily  Hypokalemia - replete, monitor     - refused labs this AM; will try again  Needs SNF. Working on options.   DVT prophylaxis: lovenox Code Status: DNR   Disposition Plan: TBD  Consultants:   Neurology  Psychiatry   ROS:  Denies palpitations, N, V, dyspnea, CP . Remainder 10-pt ROS is negative for all not previously mentioned.  Subjective: "Yes you did."  Objective: Vitals:   12/06/18  2358 12/07/18 0415 12/07/18 0837 12/07/18 1153  BP: 138/63 (!) 154/76 136/63 123/61  Pulse: 67 74 68 71  Resp: 17 17 16 18   Temp: 97.8 F (36.6 C) 98.7 F (37.1 C) 98.4 F (36.9 C) 98 F (36.7 C)  TempSrc: Oral Oral Oral Oral  SpO2: 98% 96% 97% 97%  Weight:      Height:        Intake/Output Summary (Last 24 hours) at 12/07/2018 1451 Last data filed at 12/07/2018 1300 Gross per 24 hour  Intake 720 ml  Output 600 ml  Net 120 ml   Filed Weights   12/03/18 0246 12/04/18 0500 12/05/18 0454  Weight: 97 kg 95 kg 97 kg    Examination:  General: 63 y.o. female resting in bed in NAD Cardiovascular: RRR, +S1, S2, no m/g/r Respiratory: CTABL, no w/r/r GI: BS+, NDNT, no masses noted, no organomegaly noted MSK: No e/c/c Skin: No rashes, bruises, ulcerations noted Neuro: alert to name, follows commands   Data Reviewed: I have personally reviewed following labs and imaging studies.  CBC: Recent Labs  Lab 12/04/18 0332 12/06/18 0319  WBC 8.9 9.2  NEUTROABS 6.7 6.6  HGB 12.7 12.4  HCT 38.0 37.4  MCV 92.0 92.3  PLT PLATELET CLUMPS NOTED ON SMEAR, UNABLE TO ESTIMATE [id number]   Basic Metabolic Panel: Recent Labs  Lab 12/04/18 0332 12/06/18 0319  NA 138 141  K 3.0* 3.0*  CL 109 109  CO2 18* 20*  GLUCOSE 172* 158*  BUN 20 16  CREATININE 0.52 0.47  CALCIUM 9.0 8.9  MG  --  1.7  PHOS  --  3.8   GFR: Estimated Creatinine Clearance: 83.5 mL/min (by C-G formula based on SCr of 0.47 mg/dL). Liver Function Tests: Recent Labs  Lab 12/06/18 0319  ALBUMIN 3.0*   No results for input(s): LIPASE, AMYLASE in the last 168 hours. No results for input(s): AMMONIA in the last 168 hours. Coagulation Profile: No results for input(s): INR, PROTIME in the last 168 hours. Cardiac Enzymes: No results for input(s): CKTOTAL, CKMB, CKMBINDEX, TROPONINI in the last 168 hours. BNP (last 3 results) No results for input(s): PROBNP in the last 8760 hours. HbA1C: No results for input(s):  HGBA1C in the last 72 hours. CBG: Recent Labs  Lab 12/06/18 1124 12/06/18 1634 12/06/18 2120 12/07/18 0631 12/07/18 1145  GLUCAP 230* 155* 109* 176* 323*   Lipid Profile: No results for input(s): CHOL, HDL, LDLCALC, TRIG, CHOLHDL, LDLDIRECT in the last 72 hours. Thyroid Function Tests: No results for input(s): TSH, T4TOTAL, FREET4, T3FREE, THYROIDAB in the last 72 hours. Anemia Panel: No results for input(s): VITAMINB12, FOLATE, FERRITIN, TIBC, IRON, RETICCTPCT in the last 72 hours. Sepsis Labs: No results for input(s): PROCALCITON, LATICACIDVEN in the last 168 hours.  No results found for this or any previous visit (from the past 240 hour(s)).    Radiology Studies: No results found.   Scheduled Meds: . amLODipine  10 mg Oral Daily  . atorvastatin  80 mg Oral q1800  . carvedilol  6.25 mg Oral BID WC  . clopidogrel  75 mg Oral Daily  . enoxaparin (LOVENOX) injection  40 mg Subcutaneous Q24H  . feeding supplement  1 Container Oral BID BM  . feeding supplement (PRO-STAT SUGAR FREE 64)  30 mL Oral TID  . insulin aspart  0-15 Units Subcutaneous TID WC  . insulin aspart  0-5 Units Subcutaneous QHS  . insulin aspart  4 Units Subcutaneous TID WC  . insulin glargine  10 Units Subcutaneous QHS  . linagliptin  5 mg Oral Daily  . lisinopril  40 mg Oral Daily  . pantoprazole  40 mg Oral Daily  . potassium chloride  40 mEq Oral BID  . sertraline  50 mg Oral Daily  . sodium chloride flush  10-40 mL Intracatheter Q12H  . thiamine  100 mg Oral Daily   Continuous Infusions:   LOS: 34 days    Time spent: 25 minutes spent in the coordination of care today.   Jonnie Finner, DO [facility] Pager [phone]  If 7PM-7AM, please contact night-coverage [email or web address] Password [email or web address] 12/07/2018, 2:51 PM

## 2018-12-08 DIAGNOSIS — E782 Mixed hyperlipidemia: Secondary | ICD-10-CM

## 2018-12-08 LAB — GLUCOSE, CAPILLARY
Glucose-Capillary: 177 mg/dL — ABNORMAL HIGH (ref 70–99)
Glucose-Capillary: 274 mg/dL — ABNORMAL HIGH (ref 70–99)
Glucose-Capillary: 104 mg/dL — ABNORMAL HIGH (ref 70–99)
Glucose-Capillary: 164 mg/dL — ABNORMAL HIGH (ref 70–99)

## 2018-12-08 LAB — RENAL FUNCTION PANEL
Albumin: 3.1 g/dL — ABNORMAL LOW (ref 3.5–5.0)
Anion gap: 11 (ref 5–15)
BUN: 16 mg/dL (ref 8–23)
CO2: 21 mmol/L — ABNORMAL LOW (ref 22–32)
Calcium: 9 mg/dL (ref 8.9–10.3)
Chloride: 106 mmol/L (ref 98–111)
Creatinine, Ser: 0.55 mg/dL (ref 0.44–1.00)
GFR calc Af Amer: >60 mL/min (ref >60)
GFR calc non Af Amer: >60 mL/min (ref >60)
Glucose, Bld: 157 mg/dL — ABNORMAL HIGH (ref 70–99)
Phosphorus: 3.5 mg/dL (ref 2.5–4.6)
Potassium: 3.6 mmol/L (ref 3.5–5.1)
Sodium: 138 mmol/L (ref 135–145)

## 2018-12-08 LAB — CBC WITH DIFFERENTIAL/PLATELET
Abs Immature Granulocytes: 0.04 10*3/uL (ref 0.00–0.07)
Basophils Absolute: 0 10*3/uL (ref 0.0–0.1)
Basophils Relative: 0 %
Eosinophils Absolute: 0.1 10*3/uL (ref 0.0–0.5)
Eosinophils Relative: 1 %
HCT: 35.5 % — ABNORMAL LOW (ref 36.0–46.0)
Hemoglobin: 12.2 g/dL (ref 12.0–15.0)
Immature Granulocytes: 0 %
Lymphocytes Relative: 17 %
Lymphs Abs: 1.6 10*3/uL (ref 0.7–4.0)
MCH: 31.2 pg (ref 26.0–34.0)
MCHC: 34.4 g/dL (ref 30.0–36.0)
MCV: 90.8 fL (ref 80.0–100.0)
Monocytes Absolute: 0.8 10*3/uL (ref 0.1–1.0)
Monocytes Relative: 9 %
Neutro Abs: 6.8 10*3/uL (ref 1.7–7.7)
Neutrophils Relative %: 73 %
Platelets: 281 10*3/uL (ref 150–400)
RBC: 3.91 MIL/uL (ref 3.87–5.11)
RDW: 13.5 % (ref 11.5–15.5)
WBC: 9.3 10*3/uL (ref 4.0–10.5)
nRBC: 0 % (ref 0.0–0.2)

## 2018-12-08 LAB — MAGNESIUM: Magnesium: 1.6 mg/dL — ABNORMAL LOW (ref 1.7–2.4)

## 2018-12-08 MED ORDER — MAGNESIUM OXIDE 400 (241.3 MG) MG PO TABS
400.0000 mg | ORAL_TABLET | Freq: Two times a day (BID) | ORAL | Status: DC
  Administered 2018-12-08 – 2018-12-09 (×4): 400 mg via ORAL
  Filled 2018-12-08 (×4): qty 1

## 2018-12-08 NOTE — Progress Notes (Signed)
Becky Stout  PROGRESS NOTE    Becky Stout  [id number] DOB: 03/06/1955 DOA: 11/03/2018 PCP: Abner Greenspan, MD   Brief Narrative:   63 year old female with a history of diabetes mellitus, hypertension, WPW syndrome, frequent falls, confusion, weight loss was admitted for DKA, acute CVA and metabolic encephalopathy.  11/11: Denies complaints ON. 11/12:K+ is low again this morning. She denies complaints. 11/13: Refusing labs; but other no acute events. Working on placement.  11/14: Mg2+ a [place] low. Otherwise, no acute events ON.    Assessment & Plan:   Principal Problem:   Failure to thrive in adult Active Problems:   Hypothyroidism   B12 deficiency   WOLFF (WOLFE)-PARKINSON-WHITE (WPW) SYNDROME   Essential hypertension   Hyperlipidemia   Poorly controlled type 2 diabetes mellitus (HCC)   Anxiety   Acute lower UTI   High anion gap metabolic acidosis   DKA (diabetic ketoacidoses) (HCC)   Cerebral thrombosis with cerebral infarction  DKA DM2 - A1c 7.9 - continue SSI, Novolog 4 WM, Lantus 10, linagliptin 5, carb control diet     - glucose acceptable.  Wolff-Parkinson-White syndrome HTN - continue Coreg 6.25 BID, amlodipine 10, lisinopril 40, hydralazine 10 PRN - tele - BP ok.  CVA brain - Last seen by neurology 10/11; transitioned to Plavix monotherapy - PT/OT rec SNF; WC, Drop arm 3n1, RW  Normocytic anemia B12, iron deficiency hyper menorrhagia - Hgb stable - no evidence of frank bleed, monitor  Anxiety and agoraphobia - continue Zoloft 50 daily  Hypokalemia Hypomagnesemia - K+ better, Mg2+ is a [place] low, will add MagOx  Still hammering out SNF options. Continue as above.  DVT prophylaxis: lovenox Code Status: DNR Disposition Plan: TBD  Consultants:   Neurology  Psychiatry   ROS:  Denies ab pain, dizziness, weakness, CP . Remainder 10-pt ROS is negative for all not previously mentioned.   Subjective: "That sounds good."  Objective: Vitals:   12/07/18 1559 12/07/18 1946 12/07/18 2333 12/08/18 0337  BP: (!) 131/58 131/62 (!) 142/64 (!) 142/63  Pulse: 70 64 68 73  Resp: 18 18 18 18   Temp: 98 F (36.7 C) 98.3 F (36.8 C) 98.7 F (37.1 C) 98.6 F (37 C)  TempSrc: Oral Oral Oral Oral  SpO2: 99% 97% 98% 98%  Weight:      Height:        Intake/Output Summary (Last 24 hours) at 12/08/2018 0741 Last data filed at 12/08/2018 [id number] Gross per 24 hour  Intake 480 ml  Output 300 ml  Net 180 ml   Filed Weights   12/03/18 0246 12/04/18 0500 12/05/18 0454  Weight: 97 kg 95 kg 97 kg    Examination:  General: 63 y.o. female resting in bed in NAD Cardiovascular: RRR, +S1, S2, no m/g/r, equal pulses throughout Respiratory: CTABL, no w/r/r, normal WOB GI: BS+, NDNT, soft MSK: No e/c/c Neuro: Alert to name, follows commands Psyc: Appropriate interaction but flat affect, calm/cooperative   Data Reviewed: I have personally reviewed following labs and imaging studies.  CBC: Recent Labs  Lab 12/04/18 0332 12/06/18 0319 12/08/18 0313  WBC 8.9 9.2 9.3  NEUTROABS 6.7 6.6 6.8  HGB 12.7 12.4 12.2  HCT 38.0 37.4 35.5*  MCV 92.0 92.3 90.8  PLT PLATELET CLUMPS NOTED ON SMEAR, UNABLE TO ESTIMATE 276 [id number]   Basic Metabolic Panel: Recent Labs  Lab 12/04/18 0332 12/06/18 0319 12/08/18 0313  NA 138 141 138  K 3.0* 3.0* 3.6  CL 109 109 106  CO2 18*  20* 21*  GLUCOSE 172* 158* 157*  BUN 20 16 16   CREATININE 0.52 0.47 0.55  CALCIUM 9.0 8.9 9.0  MG  --  1.7 1.6*  PHOS  --  3.8 3.5   GFR: Estimated Creatinine Clearance: 83.5 mL/min (by C-G formula based on SCr of 0.55 mg/dL). Liver Function Tests: Recent Labs  Lab 12/06/18 0319 12/08/18 0313  ALBUMIN 3.0* 3.1*   No results for input(s): LIPASE, AMYLASE in the last 168 hours. No results for input(s): AMMONIA in the last 168 hours. Coagulation Profile: No results for input(s): INR, PROTIME in the last 168 hours.  Cardiac Enzymes: No results for input(s): CKTOTAL, CKMB, CKMBINDEX, TROPONINI in the last 168 hours. BNP (last 3 results) No results for input(s): PROBNP in the last 8760 hours. HbA1C: No results for input(s): HGBA1C in the last 72 hours. CBG: Recent Labs  Lab 12/07/18 0631 12/07/18 1145 12/07/18 1606 12/07/18 2121 12/08/18 0604  GLUCAP 176* 323* 101* 240* 177*   Lipid Profile: No results for input(s): CHOL, HDL, LDLCALC, TRIG, CHOLHDL, LDLDIRECT in the last 72 hours. Thyroid Function Tests: No results for input(s): TSH, T4TOTAL, FREET4, T3FREE, THYROIDAB in the last 72 hours. Anemia Panel: No results for input(s): VITAMINB12, FOLATE, FERRITIN, TIBC, IRON, RETICCTPCT in the last 72 hours. Sepsis Labs: No results for input(s): PROCALCITON, LATICACIDVEN in the last 168 hours.  No results found for this or any previous visit (from the past 240 hour(s)).    Radiology Studies: No results found.   Scheduled Meds: . amLODipine  10 mg Oral Daily  . atorvastatin  80 mg Oral q1800  . carvedilol  6.25 mg Oral BID WC  . clopidogrel  75 mg Oral Daily  . enoxaparin (LOVENOX) injection  40 mg Subcutaneous Q24H  . feeding supplement  1 Container Oral BID BM  . feeding supplement (PRO-STAT SUGAR FREE 64)  30 mL Oral TID  . insulin aspart  0-15 Units Subcutaneous TID WC  . insulin aspart  0-5 Units Subcutaneous QHS  . insulin aspart  4 Units Subcutaneous TID WC  . insulin glargine  10 Units Subcutaneous QHS  . linagliptin  5 mg Oral Daily  . lisinopril  40 mg Oral Daily  . magnesium oxide  400 mg Oral BID  . pantoprazole  40 mg Oral Daily  . potassium chloride  40 mEq Oral BID  . sertraline  50 mg Oral Daily  . sodium chloride flush  10-40 mL Intracatheter Q12H  . thiamine  100 mg Oral Daily   Continuous Infusions:   LOS: 35 days    Time spent: 25 minutes spent in the coordination of care today.    Jonnie Finner, DO [facility] Pager [phone]  If 7PM-7AM,  please contact night-coverage [email or web address] Password [facility] 12/08/2018, 7:41 AM

## 2018-12-09 LAB — GLUCOSE, CAPILLARY
Glucose-Capillary: 175 mg/dL — ABNORMAL HIGH (ref 70–99)
Glucose-Capillary: 252 mg/dL — ABNORMAL HIGH (ref 70–99)
Glucose-Capillary: 137 mg/dL — ABNORMAL HIGH (ref 70–99)
Glucose-Capillary: 154 mg/dL — ABNORMAL HIGH (ref 70–99)

## 2018-12-09 NOTE — TOC Progression Note (Signed)
Transition of Care [facility]) - Progression Note    Patient Details  Name: Becky Stout MRN: [id number] Date of Birth: 10/03/1955  Transition of [facility]) CM/SW [place], [place] Phone Number: 12/09/2018, 9:35 AM  Clinical Narrative:     No current bed offers. CSW will continue to assist with placement.   Expected Discharge Plan: [place] Barriers to Discharge: Continued Medical Work up, Inadequate or no insurance  Expected Discharge Plan and Services Expected Discharge Plan: [place] In-house Referral: Clinical Social Work Discharge Planning Services: CM Consult Post Acute Care Choice: [place] arrangements for the past 2 months: Single Family Home                                       Social Determinants of Health (SDOH) Interventions    Readmission Risk Interventions No flowsheet data found.

## 2018-12-09 NOTE — Progress Notes (Signed)
Occupational Therapy Treatment Patient Details Name: Becky Stout MRN: [id number] DOB: [birth year] Today's Date: 12/09/2018    History of present illness 63 y.o. female with Past medical history of B12 deficiency, type II DM not on therapy, HTN, IDA, obesity, WPW syndrome.Pt dx with acute CVA, MRI reveals multiple cerebral and brainstem lacunar infarcts, acute metabolic encepholopathy, Hypertensive emergency, DKA, agorophobia, N/V, hypokalemia, and FTT.     OT comments  Pt. Seen for skilled OT treatment session.  Max encouragement but agreeable to oob for lunch.  Following one step commands more consistently but still requires redirection from her own perseverations of thought.  Able to follow cues for bed mobility but in consistent with cues with regards to instructions for hand placement on rw during transfer and for hand placement during sitting.  Continues to improve with amount of time required for initiation of tasks and participation in functional mobility.    Follow Up Recommendations  SNF    Equipment Recommendations  3 in 1 bedside commode    Recommendations for Other Services      Precautions / Restrictions Precautions Precautions: Fall Precaution Comments: extremely anxious, doesn't want to be touched (if at all possible) and fearful of falling       Mobility Bed Mobility Overal bed mobility: Needs Assistance Bed Mobility: Supine to Sit     Supine to sit: Supervision     General bed mobility comments: Supervision for safety.  She required increased time but no physical assistance.  Transfers Overall transfer level: Needs assistance Equipment used: Rolling walker (2 wheeled) Transfers: Sit to/from [facility] Sit to Stand: Mod assist Stand pivot transfers: Mod assist       General transfer comment: height of bed adjusted prior to eob/oob, which seemed to help as she was not as aware of it during sits/stand. r ue pulling on rw to stand with l  ue on bed rail. max cues to bring l ue to rw. reviewed it would make her feel more stable and strong max encouragement. finally  moved l hand onto walker "oh yeah im stronger, im stronger".  began to take pivotal steps to recliner. max cues to con.t pivot. pt. starting to sit as soon as left leg got to the recliner. max cues to complete the turn urged her to reach for arm rests "no this is happening right now, its happening" completed partial pivot and sat while still holding on to rw.  reviewed need to complete pivot and why it was imp. for her safety. "oh yeah, okay i see, yeah".    Balance                                           ADL either performed or assessed with clinical judgement   ADL Overall ADL's : Needs assistance/impaired                         Toilet Transfer: Minimal assistance;Stand-pivot;RW Toilet Transfer Details (indicate cue type and reason): simulated during transfer from eob to recliner-cues for hand placement on rw and recliner arm rests during transfer         Functional mobility during ADLs: Minimal assistance;Rolling walker General ADL Comments: improvement noted with 1 step commands with cues.  able to follow the commands but requires redirection from her own distractions.  cont. improvement with decrease  of time for completion of task which relates to following instructions more consistently     Vision       Perception     Praxis      Cognition Arousal/Alertness: Awake/alert Behavior During Therapy: Anxious                                   General Comments: improvements with willingess to get oob, improvements with time for completion of tasks.  noted to follow 1 step commands with slightly increased time due to perseverations and distractions to self        Exercises     Shoulder Instructions       General Comments      Pertinent Vitals/ Pain       Pain Assessment: No/denies pain  Home Living                                           Prior Functioning/Environment              Frequency  Min 2X/week        Progress Toward Goals  OT Goals(current goals can now be found in the care plan section)  Progress towards OT goals: Progressing toward goals     Plan Discharge plan remains appropriate    Co-evaluation                 AM-PAC OT "6 Clicks" Daily Activity     Outcome Measure   Help from another person eating meals?: A Moeller Help from another person taking care of personal grooming?: A Frank Help from another person toileting, which includes using toliet, bedpan, or urinal?: A Hasegawa Help from another person bathing (including washing, rinsing, drying)?: A Rostro Help from another person to put on and taking off regular upper body clothing?: A Muise Help from another person to put on and taking off regular lower body clothing?: A [place] 6 Click Score: 18    End of Session Equipment Utilized During Treatment: Rolling walker  OT Visit Diagnosis: Unsteadiness on feet (R26.81);Other abnormalities of gait and mobility (R26.89);Muscle weakness (generalized) (M62.81);History of falling (Z91.81);Other symptoms and signs involving cognitive function;Adult, failure to thrive (R62.7)   Activity Tolerance Patient tolerated treatment well   Patient Left in chair;with call bell/phone within reach;with chair alarm set   Nurse Communication Other (comment)(reviewed with rn pt. request to return to bed at 12:53.)        Time: [id number] OT Time Calculation (min): 12 min  Charges: OT General Charges $OT Visit: 1 Visit OT Treatments $Self Care/Home Management : 8-22 mins  Janice Coffin, COTA/L 12/09/2018, 12:38 PM

## 2018-12-09 NOTE — Progress Notes (Signed)
[name]  PROGRESS NOTE    Becky Stout  [id number] DOB: 1955-06-06 DOA: 11/03/2018 PCP: Abner Greenspan, MD   Brief Narrative:   63 year old female with a history of diabetes mellitus, hypertension, WPW syndrome, frequent falls, confusion, weight loss was admitted for DKA, acute CVA and metabolic encephalopathy. Now off insulin gtt and stable. Awaiting placement options.   11/11: Denies complaints ON. 11/12:K+ is low again this morning. She denies complaints. 11/13: Refusing labs; but other no acute events. Working on placement. 11/14: Mg2+ a Briner low. Otherwise, no acute events ON. 11/15: Refusing labs this morning; otherwise, doing well. Waiting for bed offers.    Assessment & Plan:   Principal Problem:   Failure to thrive in adult Active Problems:   Hypothyroidism   B12 deficiency   WOLFF (WOLFE)-PARKINSON-WHITE (WPW) SYNDROME   Essential hypertension   Hyperlipidemia   Poorly controlled type 2 diabetes mellitus (HCC)   Anxiety   Acute lower UTI   High anion gap metabolic acidosis   DKA (diabetic ketoacidoses) (HCC)   Cerebral thrombosis with cerebral infarction  DKA DM2 - A1c 7.9 - continue SSI, Novolog 4 WM, Lantus 10, linagliptin 5, carb control diet - glucose acceptable.  Wolff-Parkinson-White syndrome HTN - continue Coreg 6.25 BID, amlodipine 10, lisinopril 40, hydralazine 10 PRN - tele - BP ok.  CVA brain - Last seen by neurology 10/11; transitioned to Plavix monotherapy - PT/OT rec SNF; WC, Drop arm 3n1, RW  Normocytic anemia B12, iron deficiency hyper menorrhagia - Hgb stable - no evidence of frank bleed, monitor  Anxiety and agoraphobia - continue Zoloft 50 daily  Hypokalemia Hypomagnesemia - replaced, monitor  No acute events ON. Still no bed offers. Appreciate CM assistance.   DVT prophylaxis: lovenox Code Status: DNR   Disposition Plan: TBD  Consultants:   Neurology   Psychiatry  ROS:  Denies CP, dyspnea, N, V. Remainder 10-pt ROS is negative for all not previously mentioned.  Subjective: "Oh, i'm sorry. Ok."  Objective: Vitals:   12/08/18 2000 12/08/18 2345 12/09/18 0317 12/09/18 0500  BP: 126/63 122/66 (!) 156/68   Pulse: 73 76 75   Resp: 20 18 18    Temp: (!) 97.5 F (36.4 C) 97.9 F (36.6 C) 98.5 F (36.9 C)   TempSrc: Oral Oral Oral   SpO2: 97% 98% 98%   Weight:    97.5 kg  Height:       No intake or output data in the 24 hours ending 12/09/18 0739 Filed Weights   12/04/18 0500 12/05/18 0454 12/09/18 0500  Weight: 95 kg 97 kg 97.5 kg    Examination:  General: 63 y.o. female resting in bed in NAD Cardiovascular: RRR, +S1, S2, no m/g/r Respiratory: CTABL, no w/r/r GI: BS+, NDNT, no masses noted, no organomegaly noted MSK: No e/c/c Neuro: Alert to name, follows commands Psyc: Appropriate interaction and affect, calm/cooperative   Data Reviewed: I have personally reviewed following labs and imaging studies.  CBC: Recent Labs  Lab 12/04/18 0332 12/06/18 0319 12/08/18 0313  WBC 8.9 9.2 9.3  NEUTROABS 6.7 6.6 6.8  HGB 12.7 12.4 12.2  HCT 38.0 37.4 35.5*  MCV 92.0 92.3 90.8  PLT PLATELET CLUMPS NOTED ON SMEAR, UNABLE TO ESTIMATE 276 [id number]   Basic Metabolic Panel: Recent Labs  Lab 12/04/18 0332 12/06/18 0319 12/08/18 0313  NA 138 141 138  K 3.0* 3.0* 3.6  CL 109 109 106  CO2 18* 20* 21*  GLUCOSE 172* 158* 157*  BUN 20 16 16  CREATININE 0.52 0.47 0.55  CALCIUM 9.0 8.9 9.0  MG  --  1.7 1.6*  PHOS  --  3.8 3.5   GFR: Estimated Creatinine Clearance: 83.7 mL/min (by C-G formula based on SCr of 0.55 mg/dL). Liver Function Tests: Recent Labs  Lab 12/06/18 0319 12/08/18 0313  ALBUMIN 3.0* 3.1*   No results for input(s): LIPASE, AMYLASE in the last 168 hours. No results for input(s): AMMONIA in the last 168 hours. Coagulation Profile: No results for input(s): INR, PROTIME in the last 168 hours. Cardiac Enzymes:  No results for input(s): CKTOTAL, CKMB, CKMBINDEX, TROPONINI in the last 168 hours. BNP (last 3 results) No results for input(s): PROBNP in the last 8760 hours. HbA1C: No results for input(s): HGBA1C in the last 72 hours. CBG: Recent Labs  Lab 12/08/18 0604 12/08/18 1138 12/08/18 1711 12/08/18 2135 12/09/18 0620  GLUCAP 177* 274* 104* 164* 175*   Lipid Profile: No results for input(s): CHOL, HDL, LDLCALC, TRIG, CHOLHDL, LDLDIRECT in the last 72 hours. Thyroid Function Tests: No results for input(s): TSH, T4TOTAL, FREET4, T3FREE, THYROIDAB in the last 72 hours. Anemia Panel: No results for input(s): VITAMINB12, FOLATE, FERRITIN, TIBC, IRON, RETICCTPCT in the last 72 hours. Sepsis Labs: No results for input(s): PROCALCITON, LATICACIDVEN in the last 168 hours.  No results found for this or any previous visit (from the past 240 hour(s)).    Radiology Studies: No results found.   Scheduled Meds: . amLODipine  10 mg Oral Daily  . atorvastatin  80 mg Oral q1800  . carvedilol  6.25 mg Oral BID WC  . clopidogrel  75 mg Oral Daily  . enoxaparin (LOVENOX) injection  40 mg Subcutaneous Q24H  . feeding supplement  1 Container Oral BID BM  . feeding supplement (PRO-STAT SUGAR FREE 64)  30 mL Oral TID  . insulin aspart  0-15 Units Subcutaneous TID WC  . insulin aspart  0-5 Units Subcutaneous QHS  . insulin aspart  4 Units Subcutaneous TID WC  . insulin glargine  10 Units Subcutaneous QHS  . linagliptin  5 mg Oral Daily  . lisinopril  40 mg Oral Daily  . magnesium oxide  400 mg Oral BID  . pantoprazole  40 mg Oral Daily  . potassium chloride  40 mEq Oral BID  . sertraline  50 mg Oral Daily  . sodium chloride flush  10-40 mL Intracatheter Q12H  . thiamine  100 mg Oral Daily   Continuous Infusions:   LOS: 36 days    Time spent: 25 minutes spent in the coordination of care today.    Jonnie Finner, DO [facility] Pager [phone]  If 7PM-7AM, please contact  night-coverage [email or web address] Password [facility] 12/09/2018, 7:39 AM

## 2018-12-09 NOTE — Progress Notes (Signed)
Pt refused labs this morning. Made repeated attempts to convince pt to allow lab draw but pt stated that she felt fine and had told the doctor that she did not want anymore lab work. Will continue to monitor.

## 2018-12-10 LAB — GLUCOSE, CAPILLARY
Glucose-Capillary: 174 mg/dL — ABNORMAL HIGH (ref 70–99)
Glucose-Capillary: 183 mg/dL — ABNORMAL HIGH (ref 70–99)
Glucose-Capillary: 122 mg/dL — ABNORMAL HIGH (ref 70–99)
Glucose-Capillary: 120 mg/dL — ABNORMAL HIGH (ref 70–99)

## 2018-12-10 NOTE — Plan of Care (Signed)
  Problem: Nutrition: Goal: Adequate nutrition will be maintained Outcome: Progressing   

## 2018-12-10 NOTE — Progress Notes (Signed)
12/10/2018 PT GOALS updated.  PTA alerted this PT that Ms. Venne's goals needed updating.  PT is familiar with this pt from multiple previous sessions.  Checked most recent notes and updated goals as needed.    Thanks,  Verdene Lennert, PT, DPT  Acute Rehabilitation [phone] pager [phone] office  @ [name]: [phone]

## 2018-12-10 NOTE — Progress Notes (Signed)
Physical Therapy Treatment Patient Details Name: Becky Stout MRN: [id number] DOB: March 26, 1955 Today's Date: 12/10/2018    History of Present Illness 63 y.o. female with Past medical history of B12 deficiency, type II DM not on therapy, HTN, IDA, obesity, WPW syndrome.Pt dx with acute CVA, MRI reveals multiple cerebral and brainstem lacunar infarcts, acute metabolic encepholopathy, Hypertensive emergency, DKA, agorophobia, N/V, hypokalemia, and FTT.      PT Comments    Pt performed gt training and functional mobility but limited due to cognition and poor safety awareness.  Pt once again present with bowel incontinence.  Pt is slow and guarded and required max education and step by step instruction.  Pt required education on getting cleaned up after BM or asking to use the toilet.  She is not recepetive to this and reports. "I like sitting in poop." She continues to require min to moderate assistance and will benefit from snf placement at d/c.     Follow Up Recommendations  SNF     Equipment Recommendations  Wheelchair (measurements PT);Wheelchair cushion (measurements PT);3in1 (PT);Rolling walker with 5" wheels    Recommendations for Other Services       Precautions / Restrictions Precautions Precautions: Fall Precaution Comments: extremely anxious, doesn't want to be touched (if at all possible) and fearful of falling Restrictions Weight Bearing Restrictions: No    Mobility  Bed Mobility Overal bed mobility: Needs Assistance Bed Mobility: Supine to Sit;Sit to Supine Rolling: Supervision;Min assist   Supine to sit: Supervision Sit to supine: Supervision   General bed mobility comments: Supervision for safety.  She required increased time but no physical assistance.  Multiple reps of rolling post transfer training to clean patient of bowel incontinence.  Transfers Overall transfer level: Needs assistance Equipment used: Rolling walker (2 wheeled) Transfers: Sit to/from  [facility] Sit to Stand: Mod assist Stand pivot transfers: Mod assist       General transfer comment: Pt performed sit to stand from low seated surface with mod +1 to achieve standing.  She stood again from chair with armrests with min +1.  Pt sits impulsively with poor eccentric loading.  Pt with bowel incontinence noted after first initial stand.  She refused to stand for pericare and requested back to bed for pericare.  Ambulation/Gait Ambulation/Gait assistance: Min assist;+2 safety/equipment Gait Distance (Feet): 4 Feet(x2) Assistive device: Rolling walker (2 wheeled) Gait Pattern/deviations: Step-to pattern;Shuffle;Trunk flexed     General Gait Details: Pt with flexed posture and shuffling steps from bed to recliner and recliner back to bed.  She refused further gt distance.   Stairs             Wheelchair Mobility    Modified Rankin (Stroke Patients Only) Modified Rankin (Stroke Patients Only) Pre-Morbid Rankin Score: Moderate disability Modified Rankin: Moderately severe disability     Balance Overall balance assessment: Needs assistance   Sitting balance-Leahy Scale: Fair       Standing balance-Leahy Scale: Poor                              Cognition Arousal/Alertness: Awake/alert Behavior During Therapy: Anxious;Agitated Overall Cognitive Status: No family/caregiver present to determine baseline cognitive functioning Area of Impairment: Following commands;Safety/judgement;Awareness;Problem solving                 Orientation Level: Time;Situation Current Attention Level: Sustained Memory: Decreased recall of precautions;Decreased short-term memory Following Commands: Follows one step commands with increased  time;Follows one step commands inconsistently Safety/Judgement: Decreased awareness of safety;Decreased awareness of deficits Awareness: Emergent Problem Solving: Slow processing;Decreased initiation;Difficulty  sequencing;Requires verbal cues;Requires tactile cues General Comments: Pt continues to make poor decisions due to cognition.  She stood and noted to be sitting in feces.  She turned and sat in chair and refused to stand for pericare.  She reports," I like sitting in poop."      Exercises Low Level/ICU Exercises Stabilized Bridging: AROM;Both;Supine;5 reps    General Comments        Pertinent Vitals/Pain Pain Assessment: No/denies pain    Home Living                      Prior Function            PT Goals (current goals can now be found in the care plan section) Acute Rehab PT Goals Patient Stated Goal: to "rest" today PT Goal Formulation: With patient Potential to Achieve Goals: Fair Progress towards PT goals: Progressing toward goals    Frequency    Min 3X/week      PT Plan Current plan remains appropriate    Co-evaluation              AM-PAC PT "6 Clicks" Mobility   Outcome Measure  Help needed turning from your back to your side while in a flat bed without using bedrails?: A Dogan Help needed moving from lying on your back to sitting on the side of a flat bed without using bedrails?: A Mendia Help needed moving to and from a bed to a chair (including a wheelchair)?: A Lot Help needed standing up from a chair using your arms (e.g., wheelchair or bedside chair)?: A Lot Help needed to walk in hospital room?: A Lot Help needed climbing 3-5 steps with a railing? : A Lot 6 Click Score: 14    End of Session Equipment Utilized During Treatment: Gait belt Activity Tolerance: Patient limited by fatigue Patient left: with call bell/phone within reach;in bed;with bed alarm set Nurse Communication: Mobility status PT Visit Diagnosis: Other abnormalities of gait and mobility (R26.89)     Time: [id number] PT Time Calculation (min) (ACUTE ONLY): 34 min  Charges:  $Therapeutic Activity: 23-37 mins                     Erasmo Leventhal , PTA Acute  Rehabilitation Services Pager [phone] Office [phone]     Alaycia Eardley [name] 12/10/2018, 2:30 PM

## 2018-12-10 NOTE — Progress Notes (Signed)
Becky Stout  PROGRESS NOTE    Becky Stout  [id number] DOB: 02/23/1955 DOA: 11/03/2018 PCP: Abner Greenspan, MD   Brief Narrative:   63 year old female with a history of diabetes mellitus, hypertension, WPW syndrome, frequent falls, confusion, weight loss was admitted for DKA, acute CVA and metabolic encephalopathy. Now off insulin gtt and stable. Awaiting placement options.   11/16: She is bright and conversant this AM. Denies complaints.    Assessment & Plan:   Principal Problem:   Failure to thrive in adult Active Problems:   Hypothyroidism   B12 deficiency   WOLFF (WOLFE)-PARKINSON-WHITE (WPW) SYNDROME   Essential hypertension   Hyperlipidemia   Poorly controlled type 2 diabetes mellitus (HCC)   Anxiety   Acute lower UTI   High anion gap metabolic acidosis   DKA (diabetic ketoacidoses) (HCC)   Cerebral thrombosis with cerebral infarction  DKA DM2 - A1c 7.9 - currently: lantus 10, novolog 4 TID, linagliptin 5, SSI  Wolff-Parkinson-White syndrome HTN - continue Coreg 6.25 BID, amlodipine 10, lisinopril 40, hydralazine 10 PRN - BP acceptable  CVA brain - Last seen by neurology 10/11; transitioned to Plavix monotherapy - PT/OT rec SNF; WC, Drop arm 3n1, RW  Normocytic anemia B12, iron deficiency hyper menorrhagia - No evidence of bleed, monitor  Anxiety and agoraphobia - continue Zoloft 50 daily  Hypokalemia Hypomagnesemia - replaced, monitor; denying labs  CM is assisting with placement. Difficult proposition right now. Continue as above. She denies complaints.    DVT prophylaxis: lovenox Code Status: DNR   Disposition Plan: TBD  Consultants:   Neurology  Psychiatry  Subjective: "Oh, that's good. Thank you."  Objective: Vitals:   12/09/18 1959 12/10/18 0000 12/10/18 0326 12/10/18 0500  BP: 136/86 132/82 130/66   Pulse: 72 70 76   Resp: 18 20 18    Temp: 98.6 F (37 C) 98.5 F (36.9 C) 98.2 F (36.8  C)   TempSrc: Oral Oral Oral   SpO2: 100% 99% 98%   Weight:    96 kg  Height:        Intake/Output Summary (Last 24 hours) at 12/10/2018 0731 Last data filed at 12/10/2018 0418 Gross per 24 hour  Intake -  Output 450 ml  Net -450 ml   Filed Weights   12/05/18 0454 12/09/18 0500 12/10/18 0500  Weight: 97 kg 97.5 kg 96 kg    Examination:  General: 63 y.o. female resting in bed in NAD Cardiovascular: regular, no m/g/r, +s1,s2 noted Respiratory: clear, WOB normal GI: BS+, NDNT MSK: No e/c/c Neuro: alert to name, follows commands Psyc: Appropriate interaction but flat affect, calm/cooperative   Data Reviewed: I have personally reviewed following labs and imaging studies.  CBC: Recent Labs  Lab 12/04/18 0332 12/06/18 0319 12/08/18 0313  WBC 8.9 9.2 9.3  NEUTROABS 6.7 6.6 6.8  HGB 12.7 12.4 12.2  HCT 38.0 37.4 35.5*  MCV 92.0 92.3 90.8  PLT PLATELET CLUMPS NOTED ON SMEAR, UNABLE TO ESTIMATE 276 [id number]   Basic Metabolic Panel: Recent Labs  Lab 12/04/18 0332 12/06/18 0319 12/08/18 0313  NA 138 141 138  K 3.0* 3.0* 3.6  CL 109 109 106  CO2 18* 20* 21*  GLUCOSE 172* 158* 157*  BUN 20 16 16   CREATININE 0.52 0.47 0.55  CALCIUM 9.0 8.9 9.0  MG  --  1.7 1.6*  PHOS  --  3.8 3.5   GFR: Estimated Creatinine Clearance: 83 mL/min (by C-G formula based on SCr of 0.55 mg/dL). Liver Function Tests: Recent  Labs  Lab 12/06/18 0319 12/08/18 0313  ALBUMIN 3.0* 3.1*   No results for input(s): LIPASE, AMYLASE in the last 168 hours. No results for input(s): AMMONIA in the last 168 hours. Coagulation Profile: No results for input(s): INR, PROTIME in the last 168 hours. Cardiac Enzymes: No results for input(s): CKTOTAL, CKMB, CKMBINDEX, TROPONINI in the last 168 hours. BNP (last 3 results) No results for input(s): PROBNP in the last 8760 hours. HbA1C: No results for input(s): HGBA1C in the last 72 hours. CBG: Recent Labs  Lab 12/09/18 0620 12/09/18 1120 12/09/18  1617 12/09/18 2126 12/10/18 0606  GLUCAP 175* 252* 137* 154* 174*   Lipid Profile: No results for input(s): CHOL, HDL, LDLCALC, TRIG, CHOLHDL, LDLDIRECT in the last 72 hours. Thyroid Function Tests: No results for input(s): TSH, T4TOTAL, FREET4, T3FREE, THYROIDAB in the last 72 hours. Anemia Panel: No results for input(s): VITAMINB12, FOLATE, FERRITIN, TIBC, IRON, RETICCTPCT in the last 72 hours. Sepsis Labs: No results for input(s): PROCALCITON, LATICACIDVEN in the last 168 hours.  No results found for this or any previous visit (from the past 240 hour(s)).    Radiology Studies: No results found.   Scheduled Meds: . amLODipine  10 mg Oral Daily  . atorvastatin  80 mg Oral q1800  . carvedilol  6.25 mg Oral BID WC  . clopidogrel  75 mg Oral Daily  . enoxaparin (LOVENOX) injection  40 mg Subcutaneous Q24H  . feeding supplement  1 Container Oral BID BM  . feeding supplement (PRO-STAT SUGAR FREE 64)  30 mL Oral TID  . insulin aspart  0-15 Units Subcutaneous TID WC  . insulin aspart  0-5 Units Subcutaneous QHS  . insulin aspart  4 Units Subcutaneous TID WC  . insulin glargine  10 Units Subcutaneous QHS  . linagliptin  5 mg Oral Daily  . lisinopril  40 mg Oral Daily  . magnesium oxide  400 mg Oral BID  . pantoprazole  40 mg Oral Daily  . potassium chloride  40 mEq Oral BID  . sertraline  50 mg Oral Daily  . sodium chloride flush  10-40 mL Intracatheter Q12H  . thiamine  100 mg Oral Daily   Continuous Infusions:   LOS: 37 days    Time spent: 15 minutes spent in the coordination of care today.   Jonnie Finner, DO [facility] Pager [phone]  If 7PM-7AM, please contact night-coverage [email or web address] Password [facility] 12/10/2018, 7:31 AM

## 2018-12-11 LAB — GLUCOSE, CAPILLARY
Glucose-Capillary: 159 mg/dL — ABNORMAL HIGH (ref 70–99)
Glucose-Capillary: 237 mg/dL — ABNORMAL HIGH (ref 70–99)
Glucose-Capillary: 136 mg/dL — ABNORMAL HIGH (ref 70–99)
Glucose-Capillary: 126 mg/dL — ABNORMAL HIGH (ref 70–99)

## 2018-12-11 NOTE — Progress Notes (Signed)
Occupational Therapy Treatment Patient Details Name: Becky Stout MRN: [id number] DOB: 09-27-1955 Today's Date: 12/11/2018    History of present illness 63 y.o. female with Past medical history of B12 deficiency, type II DM not on therapy, HTN, IDA, obesity, WPW syndrome.Pt dx with acute CVA, MRI reveals multiple cerebral and brainstem lacunar infarcts, acute metabolic encepholopathy, Hypertensive emergency, DKA, agorophobia, N/V, hypokalemia, and FTT.     OT comments  Pt oriented to time, self, and location during this session. Pt was also able to tell therapist a significant event currently happening in the world when asked. Pt given level 1 theraband for B UE strengthening. OT demonstrating exercises and pt returning demonstrations with min cuing for technique but needing increased cuing to attend to task. Pt given increased time, cuing, and education but continues to refuse EOB or OOB activities. Pt getting upset when bed height is increased and very anxious. However, pt was very motivated for UE strengthening and band left in her room.    Follow Up Recommendations  SNF    Equipment Recommendations  Other (comment)(defer to next venur of care)       Precautions / Restrictions Precautions Precautions: Fall Precaution Comments: extremely anxious, doesn't want to be touched (if at all possible) and fearful of falling Restrictions Weight Bearing Restrictions: No       Mobility Bed Mobility Overal bed mobility: Needs Assistance Bed Mobility: Rolling Rolling: Supervision         General bed mobility comments: supervision for safety and increased time         ADL either performed or assessed with clinical judgement        Vision Baseline Vision/History: Wears glasses Wears Glasses: At all times Patient Visual Report: No change from baseline            Cognition Arousal/Alertness: Awake/alert Behavior During Therapy: Anxious;Agitated Overall Cognitive Status: No  family/caregiver present to determine baseline cognitive functioning Area of Impairment: Following commands;Safety/judgement;Awareness;Problem solving      Orientation Level: Situation;Disoriented to Current Attention Level: Sustained Memory: Decreased recall of precautions;Decreased short-term memory Following Commands: Follows one step commands with increased time;Follows one step commands inconsistently Safety/Judgement: Decreased awareness of safety;Decreased awareness of deficits Awareness: Emergent Problem Solving: Slow processing;Decreased initiation;Difficulty sequencing;Requires verbal cues;Requires tactile cues General Comments: Pt making poor decisions due to cognition and refusing OOB or EOB activities with this therapist even with increased time and education.                   Pertinent Vitals/ Pain       Pain Assessment: No/denies pain Pain Score: 0-No pain         Frequency  Min 2X/week        Progress Toward Goals  OT Goals(current goals can now be found in the care plan section)  Progress towards OT goals: Progressing toward goals  Acute Rehab OT Goals Patient Stated Goal: i'm fine OT Goal Formulation: With patient Time For Goal Achievement: 12/25/18 Potential to Achieve Goals: Good  Plan Discharge plan remains appropriate       AM-PAC OT "6 Clicks" Daily Activity     Outcome Measure     Help from another person taking care of personal grooming?: A Wheless Help from another person toileting, which includes using toliet, bedpan, or urinal?: A Hardie Help from another person bathing (including washing, rinsing, drying)?: A Hautala Help from another person to put on and taking off regular upper body clothing?: A Gayden Help from  another person to put on and taking off regular lower body clothing?: A Lot 6 Click Score: 14    End of Session    OT Visit Diagnosis: Unsteadiness on feet (R26.81);Other abnormalities of gait and mobility  (R26.89);Muscle weakness (generalized) (M62.81);History of falling (Z91.81);Other symptoms and signs involving cognitive function;Adult, failure to thrive (R62.7)   Activity Tolerance Patient tolerated treatment well   Patient Left in bed;with call bell/phone within reach;with bed alarm set           Time: 1450-1507 OT Time Calculation (min): 17 min  Charges: OT General Charges $OT Visit: 1 Visit OT Treatments $Therapeutic Exercise: 8-22 mins   Gypsy Decant MS, OTR/L 12/11/2018, 3:31 PM

## 2018-12-11 NOTE — Progress Notes (Addendum)
Becky Stout  PROGRESS NOTE    Chantrelle Valcarcel Fossett  [id number] DOB: May 12, 1955 DOA: 11/03/2018 PCP: Abner Greenspan, MD   Brief Narrative:   63 year old female with a history of diabetes mellitus, hypertension, WPW syndrome, frequent falls, confusion, weight loss was admitted for DKA, acute CVA and metabolic encephalopathy.Now off insulin gtt and stable. Awaiting placement options.  11/16: She is bright and conversant this AM. Denies complaints. 11/17: In a good mood this AM. No acute events ON.    Assessment & Plan:   Principal Problem:   Failure to thrive in adult Active Problems:   Hypothyroidism   B12 deficiency   WOLFF (WOLFE)-PARKINSON-WHITE (WPW) SYNDROME   Essential hypertension   Hyperlipidemia   Poorly controlled type 2 diabetes mellitus (HCC)   Anxiety   Acute lower UTI   High anion gap metabolic acidosis   DKA (diabetic ketoacidoses) (HCC)   Cerebral thrombosis with cerebral infarction  DKA DM2 - A1c 7.9 - currently: lantus 10, novolog 4 TID, linagliptin 5, SSI     - glucose acceptable.  Wolff-Parkinson-White syndrome HTN - continue Coreg 6.25 BID, amlodipine 10, lisinopril 40, hydralazine 10 PRN - BP and HR look good.  CVA brain - Last seen by neurology 10/11     - Plavix 75, atorvastatin 80,  - DME: WC, Drop arm 3n1, RW     - PT/OT rec SNF; difficult placement  Normocytic anemia B12, iron deficiency hyper menorrhagia - No evidence of bleed, Hgb has been stable  Anxiety and agoraphobia - continue Zoloft 50 daily     - stable, calm interactions  Hypokalemia Hypomagnesemia -replaced, monitor with periodic labs  DVT prophylaxis: lovenox Code Status: DNR   Disposition Plan: TBD  Consultants:   Neurology  Psychiatry  Subjective: "Oh. Well... that's ok, right?"  Objective: Vitals:   12/10/18 1931 12/10/18 2325 12/11/18 0321 12/11/18 0500  BP: (!) 141/65 134/64 (!) 146/68   Pulse: 73 71 70   Resp: 20  18 16    Temp: 98.6 F (37 C) 98.4 F (36.9 C) 98.3 F (36.8 C)   TempSrc: Oral Oral Oral   SpO2: 94% 99% 98%   Weight:    96 kg  Height:        Intake/Output Summary (Last 24 hours) at 12/11/2018 0734 Last data filed at 12/10/2018 1931 Gross per 24 hour  Intake 480 ml  Output 1100 ml  Net -620 ml   Filed Weights   12/09/18 0500 12/10/18 0500 12/11/18 0500  Weight: 97.5 kg 96 kg 96 kg    Examination:  General: 63 y.o. female resting in bed in NAD Cardiovascular: RRR, +S1, S2, no m/g/r, equal pulses throughout Respiratory: CTABL, no w/r/r, normal WOB GI: BS+, NDNT, no masses noted, no organomegaly noted MSK: No e/c/c Neuro: Alert to name, follows commands Psyc: flat affect, calm/cooperative   Data Reviewed: I have personally reviewed following labs and imaging studies.  CBC: Recent Labs  Lab 12/06/18 0319 12/08/18 0313  WBC 9.2 9.3  NEUTROABS 6.6 6.8  HGB 12.4 12.2  HCT 37.4 35.5*  MCV 92.3 90.8  PLT 276 [id number]   Basic Metabolic Panel: Recent Labs  Lab 12/06/18 0319 12/08/18 0313  NA 141 138  K 3.0* 3.6  CL 109 106  CO2 20* 21*  GLUCOSE 158* 157*  BUN 16 16  CREATININE 0.47 0.55  CALCIUM 8.9 9.0  MG 1.7 1.6*  PHOS 3.8 3.5   GFR: Estimated Creatinine Clearance: 83 mL/min (by C-G formula based on SCr of  0.55 mg/dL). Liver Function Tests: Recent Labs  Lab 12/06/18 0319 12/08/18 0313  ALBUMIN 3.0* 3.1*   No results for input(s): LIPASE, AMYLASE in the last 168 hours. No results for input(s): AMMONIA in the last 168 hours. Coagulation Profile: No results for input(s): INR, PROTIME in the last 168 hours. Cardiac Enzymes: No results for input(s): CKTOTAL, CKMB, CKMBINDEX, TROPONINI in the last 168 hours. BNP (last 3 results) No results for input(s): PROBNP in the last 8760 hours. HbA1C: No results for input(s): HGBA1C in the last 72 hours. CBG: Recent Labs  Lab 12/10/18 0606 12/10/18 1146 12/10/18 1619 12/10/18 2132 12/11/18 0642  GLUCAP  174* 183* 122* 120* 159*   Lipid Profile: No results for input(s): CHOL, HDL, LDLCALC, TRIG, CHOLHDL, LDLDIRECT in the last 72 hours. Thyroid Function Tests: No results for input(s): TSH, T4TOTAL, FREET4, T3FREE, THYROIDAB in the last 72 hours. Anemia Panel: No results for input(s): VITAMINB12, FOLATE, FERRITIN, TIBC, IRON, RETICCTPCT in the last 72 hours. Sepsis Labs: No results for input(s): PROCALCITON, LATICACIDVEN in the last 168 hours.  No results found for this or any previous visit (from the past 240 hour(s)).    Radiology Studies: No results found.   Scheduled Meds: . amLODipine  10 mg Oral Daily  . atorvastatin  80 mg Oral q1800  . carvedilol  6.25 mg Oral BID WC  . clopidogrel  75 mg Oral Daily  . enoxaparin (LOVENOX) injection  40 mg Subcutaneous Q24H  . feeding supplement  1 Container Oral BID BM  . feeding supplement (PRO-STAT SUGAR FREE 64)  30 mL Oral TID  . insulin aspart  0-15 Units Subcutaneous TID WC  . insulin aspart  0-5 Units Subcutaneous QHS  . insulin aspart  4 Units Subcutaneous TID WC  . insulin glargine  10 Units Subcutaneous QHS  . linagliptin  5 mg Oral Daily  . lisinopril  40 mg Oral Daily  . pantoprazole  40 mg Oral Daily  . sertraline  50 mg Oral Daily  . sodium chloride flush  10-40 mL Intracatheter Q12H  . thiamine  100 mg Oral Daily   Continuous Infusions:   LOS: 38 days    Time spent: 15 minutes spent in the coordination of care today.    Jonnie Finner, DO [facility] Pager [phone]  If 7PM-7AM, please contact night-coverage [email or web address] Password [facility] 12/11/2018, 7:34 AM

## 2018-12-11 NOTE — Plan of Care (Signed)
  Problem: Nutrition: Goal: Adequate nutrition will be maintained Outcome: Progressing   

## 2018-12-12 DIAGNOSIS — D696 Thrombocytopenia, unspecified: Secondary | ICD-10-CM

## 2018-12-12 LAB — RENAL FUNCTION PANEL
Albumin: 3.1 g/dL — ABNORMAL LOW (ref 3.5–5.0)
Anion gap: 11 (ref 5–15)
BUN: 18 mg/dL (ref 8–23)
CO2: 21 mmol/L — ABNORMAL LOW (ref 22–32)
Calcium: 9.3 mg/dL (ref 8.9–10.3)
Chloride: 105 mmol/L (ref 98–111)
Creatinine, Ser: 0.57 mg/dL (ref 0.44–1.00)
GFR calc Af Amer: >60 mL/min (ref >60)
GFR calc non Af Amer: >60 mL/min (ref >60)
Glucose, Bld: 130 mg/dL — ABNORMAL HIGH (ref 70–99)
Phosphorus: 4.5 mg/dL (ref 2.5–4.6)
Potassium: 4.6 mmol/L (ref 3.5–5.1)
Sodium: 137 mmol/L (ref 135–145)

## 2018-12-12 LAB — MAGNESIUM: Magnesium: 1.9 mg/dL (ref 1.7–2.4)

## 2018-12-12 LAB — CBC WITH DIFFERENTIAL/PLATELET
Abs Immature Granulocytes: 0.05 10*3/uL (ref 0.00–0.07)
Basophils Absolute: 0 10*3/uL (ref 0.0–0.1)
Basophils Relative: 0 %
Eosinophils Absolute: 0.1 10*3/uL (ref 0.0–0.5)
Eosinophils Relative: 1 %
HCT: 38.3 % (ref 36.0–46.0)
Hemoglobin: 13.2 g/dL (ref 12.0–15.0)
Immature Granulocytes: 1 %
Lymphocytes Relative: 26 %
Lymphs Abs: 1.6 10*3/uL (ref 0.7–4.0)
MCH: 30.9 pg (ref 26.0–34.0)
MCHC: 34.5 g/dL (ref 30.0–36.0)
MCV: 89.7 fL (ref 80.0–100.0)
Monocytes Absolute: 0.5 10*3/uL (ref 0.1–1.0)
Monocytes Relative: 8 %
Neutro Abs: 4 10*3/uL (ref 1.7–7.7)
Neutrophils Relative %: 64 %
Platelets: 49 10*3/uL — ABNORMAL LOW (ref 150–400)
RBC: 4.27 MIL/uL (ref 3.87–5.11)
RDW: 13.2 % (ref 11.5–15.5)
WBC: 6.3 10*3/uL (ref 4.0–10.5)
nRBC: 0 % (ref 0.0–0.2)

## 2018-12-12 LAB — GLUCOSE, CAPILLARY
Glucose-Capillary: 168 mg/dL — ABNORMAL HIGH (ref 70–99)
Glucose-Capillary: 236 mg/dL — ABNORMAL HIGH (ref 70–99)
Glucose-Capillary: 80 mg/dL (ref 70–99)
Glucose-Capillary: 147 mg/dL — ABNORMAL HIGH (ref 70–99)

## 2018-12-12 NOTE — Progress Notes (Signed)
Physical Therapy Treatment Patient Details Name: ALIERA BARBUSH MRN: [id number] DOB: Oct 05, 1955 Today's Date: 12/12/2018    History of Present Illness 63 y.o. female with Past medical history of B12 deficiency, type II DM not on therapy, HTN, IDA, obesity, WPW syndrome.Pt dx with acute CVA, MRI reveals multiple cerebral and brainstem lacunar infarcts, acute metabolic encepholopathy, Hypertensive emergency, DKA, agorophobia, N/V, hypokalemia, and FTT.      PT Comments    Pt required max VCs for progression of functional mobility.  She is limited due to severe anxiety, fear of falling and poor ability to process the benefits of functional mobility.  Pt continues to benefit from skilled rehab in a post acute setting to improve strength and function before returning home.     Follow Up Recommendations  SNF     Equipment Recommendations  Wheelchair (measurements PT);Wheelchair cushion (measurements PT);3in1 (PT);Rolling walker with 5" wheels    Recommendations for Other Services       Precautions / Restrictions Precautions Precautions: Fall Precaution Comments: extremely anxious, doesn't want to be touched (if at all possible) and fearful of falling Restrictions Weight Bearing Restrictions: No    Mobility  Bed Mobility Overal bed mobility: Needs Assistance Bed Mobility: Rolling;Supine to Sit Rolling: Supervision   Supine to sit: Supervision Sit to supine: Supervision   General bed mobility comments: supervision for safety and increased time, Pt also able to bridge and scoot to [facility] with max VCs to problem solve tasks.  Transfers Overall transfer level: Needs assistance Equipment used: Rolling walker (2 wheeled) Transfers: Sit to/from Stand Sit to Stand: Min guard         General transfer comment: Pt with bed placed in standard seat height position.  She refused to more than a simple sit to stand edge of bed and only able to hold this standing trial for 60 sec due to  perseveration to return back to bed.  Ambulation/Gait Ambulation/Gait assistance: (NT pt refused to ambulate.)               Stairs             Wheelchair Mobility    Modified Rankin (Stroke Patients Only)       Balance Overall balance assessment: Needs assistance Sitting-balance support: Feet supported;Bilateral upper extremity supported Sitting balance-Leahy Scale: Fair Sitting balance - Comments: supervision EOB, pt holding to rails (one or both) most of the time EOB.    Standing balance support: Bilateral upper extremity supported Standing balance-Leahy Scale: Poor Standing balance comment: reliant on BUE support on RW                            Cognition Arousal/Alertness: Awake/alert Behavior During Therapy: Anxious(less anxious than previous sessions.)   Area of Impairment: Following commands;Safety/judgement;Awareness;Problem solving                 Orientation Level: Situation;Disoriented to Current Attention Level: Sustained Memory: Decreased recall of precautions;Decreased short-term memory Following Commands: Follows one step commands with increased time;Follows one step commands inconsistently Safety/Judgement: Decreased awareness of safety;Decreased awareness of deficits Awareness: Emergent Problem Solving: Slow processing;Decreased initiation;Difficulty sequencing;Requires verbal cues;Requires tactile cues General Comments: Pt continues to self limit due to poor safety awareness and cognition.  She remains extremely limited due to fear of falling.      Exercises      General Comments        Pertinent Vitals/Pain Pain Assessment: Faces Pain Score:  0-No pain    Home Living                      Prior Function            PT Goals (current goals can now be found in the care plan section) Acute Rehab PT Goals Patient Stated Goal: i'm fine Potential to Achieve Goals: Fair Progress towards PT goals:  Progressing toward goals    Frequency    Min 3X/week      PT Plan Current plan remains appropriate    Co-evaluation              AM-PAC PT "6 Clicks" Mobility   Outcome Measure  Help needed turning from your back to your side while in a flat bed without using bedrails?: A Sitzmann Help needed moving from lying on your back to sitting on the side of a flat bed without using bedrails?: A Goetzinger Help needed moving to and from a bed to a chair (including a wheelchair)?: A Buschman Help needed standing up from a chair using your arms (e.g., wheelchair or bedside chair)?: A Steuck Help needed to walk in hospital room?: A Lot Help needed climbing 3-5 steps with a railing? : A Lot 6 Click Score: 16    End of Session Equipment Utilized During Treatment: Gait belt Activity Tolerance: Patient limited by fatigue Patient left: with call bell/phone within reach;in bed;with bed alarm set Nurse Communication: Mobility status PT Visit Diagnosis: Other abnormalities of gait and mobility (R26.89)     Time: [id number] PT Time Calculation (min) (ACUTE ONLY): 22 min  Charges:  $Therapeutic Activity: 8-22 mins                     Erasmo Leventhal , PTA Acute Rehabilitation Services Pager [phone] Office [phone]     Lamyia Cdebaca [name] 12/12/2018, 2:49 PM

## 2018-12-12 NOTE — Progress Notes (Signed)
NP called because pt became argumentative and combative with Lab staff when attempting to draw r/p CBC to check plts. Pts have dropped from the 200s to 49 since admit. Attending had ordered a recheck. Will try again in the a.m. Note left for attending. No active bleeding. Will d/c Lovenox until we verify count. SCDs.  KJKG, NP [facility]

## 2018-12-12 NOTE — Plan of Care (Signed)
Progressing towards plan of care goals.

## 2018-12-12 NOTE — Progress Notes (Signed)
  Speech Language Pathology Treatment: Cognitive-Linquistic  Patient Details Name: Becky Stout MRN: [id number] DOB: 12-04-1955 Today's Date: 12/12/2018 Time: [id number] SLP Time Calculation (min) (ACUTE ONLY): 35 min  Assessment / Plan / Recommendation Clinical Impression  Pt was seen at bedside for skilled ST intervention targeting cognitive linguistic goals for recall, problem solving, and awareness. Pt is highly distractable, with perseveration on her internal train of thought. The [place] Mental Status (SLUMS) Examination was administered. Pt scored 13/30 indicating significant neurocognitive disorder. Pt was oriented to place and time, but was unable to verbalize rationale for hospitalization other than "I'm here because my family found me passed out". Pt had difficulty with mental math task, unable to take herself out of the equation ("Well I don't eat apples, and I get all my food from restaurants"), but was able to correctly determine how much was left of $100 after spending $23. Pt exhibited good immediate recall of 5/5 unrelated words, but was recalled 2/5 after delay. Pt was able to to repeat 4 digit numbers in reverse without difficulty. She had no recollection of having ever seen an analog clock despite description, and so was unable to complete clock drawing task. Pt named only 2 animals in 60 seconds, stating "dogs and cats are the only animals I know about" despite encouragement by SLP. She was unable to follow verbal direction to "place an "X" in the triangle" when presented with a square, a triangle, and a rectangle written on paper. Pt was accurate for 2/4 questions regarding auditory retention/recall of information.   Pt frequently repeats "you know" during the session, and is overly expressive with appreciation. She continues to want things done in a particular way, and is quite child-like in her interaction and responses. No family is present at this time to further  discuss baseline level of function, however, her sister was present on a previous session and indicated that pt has had 2 TBIs in the past with residual cognitive impairments. Pt may be at or near her cognitive baseline at this time. Further ST intervention may be most appropriate at next venue/long term setting for establishment of routines and safety strategies that are managed for her in this setting. Will follow briefly and attempt to contact family.    HPI HPI: 63 y.o. female with Past medical history of B12 deficiency, type II DM not on therapy, HTN, IDA, obesity, WPW syndrome. Patient was brought in secondary to increasing confusion as well as failure to thrive and a fall.  MRI revealed scattered small acute infarcts in both cerebral hemispheres and brainstem and chronic basal ganglia infarcts and moderately advanced cerebral atrophy.      SLP Plan  Goals updated;Continue with current plan of care       Recommendations  24 hour supervision                Oral Care Recommendations: Oral care BID Follow up Recommendations: Skilled Nursing facility;24 hour supervision/assistance SLP Visit Diagnosis: Cognitive communication deficit [id number]) Plan: Goals updated;Continue with current plan of care       [place], [facility], [place] Speech Language Pathologist Office: [phone] Pager: [phone]   Shonna Chock 12/12/2018, 12:22 PM

## 2018-12-12 NOTE — Progress Notes (Signed)
PROGRESS NOTE   Rasheedah Limback Celani  [id number]    DOB: 1955/09/15    DOA: 11/03/2018  PCP: Abner Greenspan, MD   I have briefly reviewed patients previous medical records in [facility].  Chief Complaint  Patient presents with  . Nausea  . Emesis  . Altered Mental Status    Brief Narrative:  63 year old female with PMH of DM 2, HTN, WPW syndrome, frequent falls, confusion, and weight loss was admitted for DKA, acute CVA and metabolic encephalopathy.  Prolonged hospital stay due to difficulty in SNF placement.   Assessment & Plan:   Principal Problem:   Failure to thrive in adult Active Problems:   Hypothyroidism   B12 deficiency   WOLFF (WOLFE)-PARKINSON-WHITE (WPW) SYNDROME   Essential hypertension   Hyperlipidemia   Poorly controlled type 2 diabetes mellitus (HCC)   Anxiety   Acute lower UTI   High anion gap metabolic acidosis   DKA (diabetic ketoacidoses) (HCC)   Cerebral thrombosis with cerebral infarction   DKA in type II DM, not at goal.  DKA resolved.  A1c 7.9.  CBGs currently reasonably controlled with mild intermittent increases on current regimen of Lantus 10 units at bedtime, NovoLog 4 units 3 times daily with meals, SSI and Tradjenta.  Continue current regimen.  Essential hypertension Controlled on carvedilol, amlodipine, lisinopril and hydralazine.  WPW history  CVA  Neurology signed off 10/11  Continue statins, Plavix, adequate blood pressure control.  Awaiting SNF for rehab.  Normocytic anemia/B12 and iron deficiency, menorrhagia  Hemoglobin normal.  Anxiety and agoraphobia  Continue Zoloft.  Stable.  Hypokalemia and hypomagnesemia  Replaced.  Thrombocytopenia  Platelet counts have dropped from 281 on 11/14to 49 today.?  Lab error.  Repeat to verify and consider further evaluation if true.  No bleeding reported.  Body mass index is 37.09 kg/m.  Nutritional Status Nutrition Problem: Inadequate oral intake Etiology: poor  appetite Signs/Symptoms: per patient/family report Interventions: Prostat, Boost Breeze  DVT prophylaxis: Lovenox Code Status: DNR Family Communication: None at bedside Disposition: Awaiting SNF   Consultants:  Neurology Psychiatry  Procedures:    Antimicrobials:  None   Subjective: Patient denies complaints.  No pain or other complaints reported.  As per RN, no acute issues noted.  Objective:  Vitals:   12/12/18 0344 12/12/18 0905 12/12/18 1132 12/12/18 1549  BP:  121/66 101/77 136/61  Pulse:  75 72 70  Resp:  18 18 18   Temp:  97.8 F (36.6 C) 98.4 F (36.9 C) 98 F (36.7 C)  TempSrc:  Oral Oral Oral  SpO2:  98% 100% 100%  Weight: 98 kg     Height:        Examination:  General exam: Pleasant young female lying comfortably in bed. Respiratory system: Clear to auscultation. Respiratory effort normal. Cardiovascular system: S1 & S2 heard, RRR. No JVD, murmurs, rubs, gallops or clicks. No pedal edema. Gastrointestinal system: Abdomen is nondistended, soft and nontender. No organomegaly or masses felt. Normal bowel sounds heard. Central nervous system: Alert and oriented. No focal neurological deficits. Extremities: Symmetric 5 x 5 power. Skin: No rashes, lesions or ulcers Psychiatry: Judgement and insight appear impaired. Mood & affect appropriate.     Data Reviewed: I have personally reviewed following labs and imaging studies   CBC: Recent Labs  Lab 12/06/18 0319 12/08/18 0313 12/12/18 0319  WBC 9.2 9.3 6.3  NEUTROABS 6.6 6.8 4.0  HGB 12.4 12.2 13.2  HCT 37.4 35.5* 38.3  MCV 92.3 90.8 89.7  PLT 276 281 49*    Basic Metabolic Panel: Recent Labs  Lab 12/06/18 0319 12/08/18 0313 12/12/18 0319  NA 141 138 137  K 3.0* 3.6 4.6  CL 109 106 105  CO2 20* 21* 21*  GLUCOSE 158* 157* 130*  BUN 16 16 18   CREATININE 0.47 0.55 0.57  CALCIUM 8.9 9.0 9.3  MG 1.7 1.6* 1.9  PHOS 3.8 3.5 4.5    Liver Function Tests: Recent Labs  Lab 12/06/18 0319  12/08/18 0313 12/12/18 0319  ALBUMIN 3.0* 3.1* 3.1*    CBG: Recent Labs  Lab 12/11/18 1644 12/11/18 2135 12/12/18 0626 12/12/18 1134 12/12/18 1622  GLUCAP 136* 126* 168* 236* 80    No results found for this or any previous visit (from the past 240 hour(s)).    Radiology Studies: No results found.        Scheduled Meds: . amLODipine  10 mg Oral Daily  . atorvastatin  80 mg Oral q1800  . carvedilol  6.25 mg Oral BID WC  . clopidogrel  75 mg Oral Daily  . enoxaparin (LOVENOX) injection  40 mg Subcutaneous Q24H  . feeding supplement  1 Container Oral BID BM  . feeding supplement (PRO-STAT SUGAR FREE 64)  30 mL Oral TID  . insulin aspart  0-15 Units Subcutaneous TID WC  . insulin aspart  0-5 Units Subcutaneous QHS  . insulin aspart  4 Units Subcutaneous TID WC  . insulin glargine  10 Units Subcutaneous QHS  . linagliptin  5 mg Oral Daily  . lisinopril  40 mg Oral Daily  . pantoprazole  40 mg Oral Daily  . sertraline  50 mg Oral Daily  . sodium chloride flush  10-40 mL Intracatheter Q12H  . thiamine  100 mg Oral Daily   Continuous Infusions:   LOS: 60 days     Vernell Leep, MD, [place], [facility]. [facility]  To contact the attending provider between 7A-7P or the covering provider during after hours 7P-7A, please log into the web site [email or web address] and access using universal [place] password for that web site. If you do not have the password, please call the hospital operator.  12/12/2018, 6:37 PM

## 2018-12-12 NOTE — Plan of Care (Signed)
  Problem: Education: Goal: Knowledge of General Education information will improve Description: Including pain rating scale, medication(s)/side effects and non-pharmacologic comfort measures Outcome: Progressing   Problem: Health Behavior/Discharge Planning: Goal: Ability to manage health-related needs will improve Outcome: Progressing   Problem: Clinical Measurements: Goal: Ability to maintain clinical measurements within normal limits will improve Outcome: Progressing Goal: Will remain free from infection Outcome: Progressing Goal: Diagnostic test results will improve Outcome: Progressing Goal: Respiratory complications will improve Outcome: Progressing Goal: Cardiovascular complication will be avoided Outcome: Progressing   Problem: Activity: Goal: Risk for activity intolerance will decrease Outcome: Progressing   Problem: Nutrition: Goal: Adequate nutrition will be maintained Outcome: Progressing   Problem: Coping: Goal: Level of anxiety will decrease Outcome: Progressing   Problem: Elimination: Goal: Will not experience complications related to bowel motility Outcome: Progressing Goal: Will not experience complications related to urinary retention Outcome: Progressing   Problem: Pain Managment: Goal: General experience of comfort will improve Outcome: Progressing   Problem: Safety: Goal: Ability to remain free from injury will improve Outcome: Progressing   Problem: Skin Integrity: Goal: Risk for impaired skin integrity will decrease Outcome: Progressing   Problem: Education: Goal: Knowledge of secondary prevention will improve Outcome: Progressing Goal: Knowledge of patient specific risk factors addressed and post discharge goals established will improve Outcome: Progressing Goal: Individualized Educational Video(s) Outcome: Progressing   Problem: Coping: Goal: Will verbalize positive feelings about self Outcome: Progressing Goal: Will identify  appropriate support needs Outcome: Progressing   Problem: Self-Care: Goal: Ability to participate in self-care as condition permits will improve Outcome: Progressing Goal: Verbalization of feelings and concerns over difficulty with self-care will improve Outcome: Progressing   Problem: Nutrition: Goal: Dietary intake will improve Outcome: Progressing   Problem: Ischemic Stroke/TIA Tissue Perfusion: Goal: Complications of ischemic stroke/TIA will be minimized Outcome: Progressing   Ival Bible, BSN, RN

## 2018-12-13 LAB — CBC
HCT: 37.6 % (ref 36.0–46.0)
Hemoglobin: 12.8 g/dL (ref 12.0–15.0)
MCH: 30.8 pg (ref 26.0–34.0)
MCHC: 34 g/dL (ref 30.0–36.0)
MCV: 90.4 fL (ref 80.0–100.0)
Platelets: 320 10*3/uL (ref 150–400)
RBC: 4.16 MIL/uL (ref 3.87–5.11)
RDW: 13.2 % (ref 11.5–15.5)
WBC: 9 10*3/uL (ref 4.0–10.5)
nRBC: 0 % (ref 0.0–0.2)

## 2018-12-13 LAB — GLUCOSE, CAPILLARY
Glucose-Capillary: 153 mg/dL — ABNORMAL HIGH (ref 70–99)
Glucose-Capillary: 142 mg/dL — ABNORMAL HIGH (ref 70–99)
Glucose-Capillary: 136 mg/dL — ABNORMAL HIGH (ref 70–99)

## 2018-12-13 MED ORDER — ENOXAPARIN SODIUM 40 MG/0.4ML ~~LOC~~ SOLN
40.0000 mg | SUBCUTANEOUS | Status: DC
  Administered 2018-12-13 – 2019-02-01 (×51): 40 mg via SUBCUTANEOUS
  Filled 2018-12-13 (×50): qty 0.4

## 2018-12-13 NOTE — Progress Notes (Signed)
Nutrition Follow-up  DOCUMENTATION CODES:   Not applicable  INTERVENTION:  - continue Boost Breeze BID and 30 ml prostat TID.  - continue to encourage PO intakes. - if intakes of meals and supplements continue to be what they currently are, can likely sign off at the time of next follow-up.    NUTRITION DIAGNOSIS:   Inadequate oral intake related to poor appetite as evidenced by per patient/family report -resolved, no new nutrition dx.   GOAL:   Patient will meet greater than or equal to 90% of their needs -met  MONITOR:   PO intake, Supplement acceptance, Skin, Weight trends, Labs, I & O's  ASSESSMENT:   63 y.o. female with Past medical history of B12 deficiency, type II DM not on therapy, HTN, CHF, IDA, obesity, WPW syndrome.Patient was brought into secondary to increasing confusion as well as failure to thrive and a fall.62 y.o. female with Past medical history of B12 deficiency, type II DM not on therapy, HTN, IDA, obesity, WPW syndrome.Patient was brought into secondary to increasing confusion as well as failure to thrive and a fall. Scattered small acute subcortical infarcts in both cerebral hemispheres and brainstem.  Weight has been stable for the past 2 weeks. Patient has been eating 100% of all meals over the past 4 days and she has accepted all cartons of Boost Breeze and all packets of prostat since RD assessment on 11/12. Patient is noted to be a/o to self and place.   Per notes: - A1c: 7.9% - CVA--Neuro signed off on 10/11 - pending SNF for rehab - normocytic anemia; B12 and iron deficiency; menorrhagia--Hgb stable - thrombocytopenia--platelets dropping   Labs reviewed; CBG: 153 mg/dl. Medications reviewed; sliding scale novolog, 4 units novolog TID, 10 units lantus/day, 5 mg tradjenta/day, 40 mg oral protonix/day, 100 mg thiamine/day.    Diet Order:   Diet Order            Diet Carb Modified Fluid consistency: Thin; Room service appropriate? Yes  Diet  effective now              EDUCATION NEEDS:   Not appropriate for education at this time  Skin:  Skin Assessment: Reviewed RN Assessment  Last BM:  11/18  Height:   Ht Readings from Last 1 Encounters:  12/03/18 '5\' 4"'  (1.626 m)    Weight:   Wt Readings from Last 1 Encounters:  12/13/18 97.9 kg    Ideal Body Weight:  54.5 kg  BMI:  Body mass index is 37.05 kg/m.  Estimated Nutritional Needs:   Kcal:  1800-2000  Protein:  85-100 grams  Fluid:  >/= 1.8 L/day     Jarome Matin, MS, RD, LDN, [facility] Clinical Dietitian Pager # [phone] After hours/weekend pager # [phone]
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
## 2018-12-13 NOTE — Progress Notes (Signed)
PROGRESS NOTE   Becky Stout  [id number]    DOB: 14-Jun-1955    DOA: 11/03/2018  PCP: Abner Greenspan, MD   I have briefly reviewed patients previous medical records in [facility].  Chief Complaint  Patient presents with  . Nausea  . Emesis  . Altered Mental Status    Brief Narrative:  63 year old female with PMH of DM 2, HTN, WPW syndrome, frequent falls, confusion, and weight loss was admitted for DKA, acute CVA and metabolic encephalopathy.  Prolonged hospital stay due to difficulty in SNF placement.   Assessment & Plan:   Principal Problem:   Failure to thrive in adult Active Problems:   Hypothyroidism   B12 deficiency   WOLFF (WOLFE)-PARKINSON-WHITE (WPW) SYNDROME   Essential hypertension   Hyperlipidemia   Poorly controlled type 2 diabetes mellitus (HCC)   Anxiety   Acute lower UTI   High anion gap metabolic acidosis   DKA (diabetic ketoacidoses) (HCC)   Cerebral thrombosis with cerebral infarction   DKA in type II DM, not at goal.  DKA resolved.  A1c 7.9.  CBGs currently reasonably controlled with mild intermittent increases on current regimen of Lantus 10 units at bedtime, NovoLog 4 units 3 times daily with meals, SSI and Tradjenta.  Continue current regimen.  Stable.  Essential hypertension Controlled on carvedilol, amlodipine, lisinopril.  WPW history  CVA  Neurology signed off 10/11  Continue statins, Plavix, adequate blood pressure control.  Awaiting SNF for rehab.  As per discussion with clinical social work today, difficult placement due to lack of insurance.  Normocytic anemia/B12 and iron deficiency, menorrhagia  Hemoglobin normal.  Anxiety and agoraphobia  Continue Zoloft.  Stable.  Hypokalemia and hypomagnesemia  Replaced.  Thrombocytopenia  Platelet counts have dropped from 281 on 11/14to 49 on 11/18.  Repeated and normal today.  Yesterday's labs were likely abnormal.  Resume prophylactic dose Lovenox.  Body mass  index is 37.05 kg/m.  Nutritional Status Nutrition Problem: Inadequate oral intake Etiology: poor appetite Signs/Symptoms: per patient/family report Interventions: Prostat, Boost Breeze  DVT prophylaxis: Lovenox Code Status: DNR Family Communication: None at bedside Disposition: Awaiting SNF, difficult placement.   Consultants:  Neurology Psychiatry  Procedures:    Antimicrobials:  None   Subjective: Overnight events noted, refused lab draws but agreeable for a one-time stick today.  Denies any other complaints.  As per RN, no acute issues noted.  Objective:  Vitals:   12/13/18 0500 12/13/18 0730 12/13/18 1156 12/13/18 1545  BP:  129/62 130/65 137/64  Pulse:  70 69 73  Resp:  20 20 20   Temp:  98.1 F (36.7 C) 99 F (37.2 C) 98.6 F (37 C)  TempSrc:  Oral Oral Oral  SpO2:  99% 97% 97%  Weight: 97.9 kg     Height:        Examination:  General exam: Pleasant young female lying comfortably in bed. Respiratory system: Clear to auscultation.  No increased work of breathing. Cardiovascular system: S1 & S2 heard, RRR. No JVD, murmurs, rubs, gallops or clicks. No pedal edema. Gastrointestinal system: Abdomen is nondistended, soft and nontender. No organomegaly or masses felt. Normal bowel sounds heard. Central nervous system: Alert and oriented. No focal neurological deficits. Extremities: Symmetric 5 x 5 power. Skin: No rashes, lesions or ulcers Psychiatry: Judgement and insight appear impaired. Mood & affect mostly flat.    Data Reviewed: I have personally reviewed following labs and imaging studies   CBC: Recent Labs  Lab 12/08/18 0313  12/12/18 0319 12/13/18 1039  WBC 9.3 6.3 9.0  NEUTROABS 6.8 4.0  --   HGB 12.2 13.2 12.8  HCT 35.5* 38.3 37.6  MCV 90.8 89.7 90.4  PLT 281 49* [id number]    Basic Metabolic Panel: Recent Labs  Lab 12/08/18 0313 12/12/18 0319  NA 138 137  K 3.6 4.6  CL 106 105  CO2 21* 21*  GLUCOSE 157* 130*  BUN 16 18   CREATININE 0.55 0.57  CALCIUM 9.0 9.3  MG 1.6* 1.9  PHOS 3.5 4.5    Liver Function Tests: Recent Labs  Lab 12/08/18 0313 12/12/18 0319  ALBUMIN 3.1* 3.1*    CBG: Recent Labs  Lab 12/12/18 1622 12/12/18 2124 12/13/18 0612 12/13/18 1154 12/13/18 1632  GLUCAP 80 147* 153* 142* 136*    No results found for this or any previous visit (from the past 240 hour(s)).    Radiology Studies: No results found.        Scheduled Meds: . amLODipine  10 mg Oral Daily  . atorvastatin  80 mg Oral q1800  . carvedilol  6.25 mg Oral BID WC  . clopidogrel  75 mg Oral Daily  . feeding supplement  1 Container Oral BID BM  . feeding supplement (PRO-STAT SUGAR FREE 64)  30 mL Oral TID  . insulin aspart  0-15 Units Subcutaneous TID WC  . insulin aspart  0-5 Units Subcutaneous QHS  . insulin aspart  4 Units Subcutaneous TID WC  . insulin glargine  10 Units Subcutaneous QHS  . linagliptin  5 mg Oral Daily  . lisinopril  40 mg Oral Daily  . pantoprazole  40 mg Oral Daily  . sertraline  50 mg Oral Daily  . sodium chloride flush  10-40 mL Intracatheter Q12H  . thiamine  100 mg Oral Daily   Continuous Infusions:   LOS: 40 days     Vernell Leep, MD, [place], [facility]. [facility]  To contact the attending provider between 7A-7P or the covering provider during after hours 7P-7A, please log into the web site [email or web address] and access using universal [place] password for that web site. If you do not have the password, please call the hospital operator.  12/13/2018, 6:37 PM

## 2018-12-14 LAB — GLUCOSE, CAPILLARY
Glucose-Capillary: 127 mg/dL — ABNORMAL HIGH (ref 70–99)
Glucose-Capillary: 177 mg/dL — ABNORMAL HIGH (ref 70–99)
Glucose-Capillary: 202 mg/dL — ABNORMAL HIGH (ref 70–99)
Glucose-Capillary: 134 mg/dL — ABNORMAL HIGH (ref 70–99)
Glucose-Capillary: 160 mg/dL — ABNORMAL HIGH (ref 70–99)

## 2018-12-14 NOTE — Progress Notes (Addendum)
Physical Therapy Treatment Patient Details Name: Becky Stout MRN: [id number] DOB: 1955/12/31 Today's Date: 12/14/2018    History of Present Illness 63 y.o. female with Past medical history of B12 deficiency, type II DM not on therapy, HTN, IDA, obesity, WPW syndrome.Pt dx with acute CVA, MRI reveals multiple cerebral and brainstem lacunar infarcts, acute metabolic encepholopathy, Hypertensive emergency, DKA, agorophobia, N/V, hypokalemia, and FTT.      PT Comments    Pt performed gt training from edge of bed to chair pushed next to the while.  Pt remains very fearful of falling and refused to allow PTA tp move chair further away from her bed.  She is progressing well and more compliant this session.  Plan for SNF placement continues to be appropriate to allow for strengthening and functional gains.      Follow Up Recommendations  SNF     Equipment Recommendations  Wheelchair (measurements PT);Wheelchair cushion (measurements PT);3in1 (PT);Rolling walker with 5" wheels    Recommendations for Other Services       Precautions / Restrictions Precautions Precautions: Fall Precaution Comments: extremely anxious, doesn't want to be touched (if at all possible) and fearful of falling Restrictions Weight Bearing Restrictions: No    Mobility  Bed Mobility Overal bed mobility: Needs Assistance Bed Mobility: Supine to Sit;Sit to Supine Rolling: Supervision   Supine to sit: Supervision     General bed mobility comments: Cues for moving to edge of bed and to scoot hips towards edge of bed.  She refused to have  height of bed elevated so she was seated on edge of low bed with hips lower than knees.  Transfers Overall transfer level: Needs assistance Equipment used: Rolling walker (2 wheeled)   Sit to Stand: Mod assist(from low seated surface.)         General transfer comment: Cues for hand placement to and from seated surface.  Required assistance to boost into  standing.  Ambulation/Gait Ambulation/Gait assistance: Min assist Gait Distance (Feet): 6 Feet Assistive device: Rolling walker (2 wheeled) Gait Pattern/deviations: Step-to pattern;Shuffle;Trunk flexed     General Gait Details: Pt required assistance to move from bed to recliner.  Pushed recliner away from the bed to allow for increased gt.  Attempted to move chair to other side of room but patient became agitated.   Stairs             Wheelchair Mobility    Modified Rankin (Stroke Patients Only) Modified Rankin (Stroke Patients Only) Pre-Morbid Rankin Score: Moderate disability Modified Rankin: Moderately severe disability     Balance Overall balance assessment: Needs assistance   Sitting balance-Leahy Scale: Fair       Standing balance-Leahy Scale: Poor Standing balance comment: reliant on BUE support on RW                            Cognition Arousal/Alertness: Awake/alert Behavior During Therapy: Anxious Overall Cognitive Status: No family/caregiver present to determine baseline cognitive functioning Area of Impairment: Following commands;Safety/judgement;Awareness;Problem solving                 Orientation Level: Situation;Disoriented to Current Attention Level: Sustained Memory: Decreased recall of precautions;Decreased short-term memory Following Commands: Follows one step commands with increased time;Follows one step commands inconsistently Safety/Judgement: Decreased awareness of safety;Decreased awareness of deficits Awareness: Emergent Problem Solving: Slow processing;Decreased initiation;Difficulty sequencing;Requires verbal cues;Requires tactile cues General Comments: Pt continues to self limit due to poor safety awareness and cognition.  She remains extremely limited due to fear of falling.      Exercises General Exercises - Lower Extremity Long Arc Quad: AROM;Both;10 reps;Seated Hip Flexion/Marching: AROM;Both;10  reps;Seated    General Comments        Pertinent Vitals/Pain Pain Assessment: No/denies pain    Home Living                      Prior Function            PT Goals (current goals can now be found in the care plan section) Acute Rehab PT Goals Patient Stated Goal: to not stand up. Potential to Achieve Goals: Fair    Frequency    Min 3X/week      PT Plan Current plan remains appropriate    Co-evaluation              AM-PAC PT "6 Clicks" Mobility   Outcome Measure  Help needed turning from your back to your side while in a flat bed without using bedrails?: A [place] Help needed moving from lying on your back to sitting on the side of a flat bed without using bedrails?: A [place] Help needed moving to and from a bed to a chair (including a wheelchair)?: A [place] Help needed standing up from a chair using your arms (e.g., wheelchair or bedside chair)?: A [place] Help needed to walk in hospital room?: A Lot Help needed climbing 3-5 steps with a railing? : A Lot 6 Click Score: 16    End of Session Equipment Utilized During Treatment: Gait belt Activity Tolerance: Patient limited by fatigue Patient left: with call bell/phone within reach;in chair;with chair alarm set Nurse Communication: Mobility status PT Visit Diagnosis: Other abnormalities of gait and mobility (R26.89)     Time: [id number] PT Time Calculation (min) (ACUTE ONLY): 17 min  Charges:  $Therapeutic Activity: 8-22 mins                     Erasmo Leventhal , PTA Acute Rehabilitation Services Pager [phone] Office [phone]     Maxen Rowland [name] 12/14/2018, 3:31 PM

## 2018-12-14 NOTE — Progress Notes (Signed)
PROGRESS NOTE   Becky Stout  [id number]    DOB: 1955-07-16    DOA: 11/03/2018  PCP: Abner Greenspan, MD   I have briefly reviewed patients previous medical records in [facility].  Chief Complaint  Patient presents with  . Nausea  . Emesis  . Altered Mental Status    Brief Narrative:  63 year old female with PMH of DM 2, HTN, WPW syndrome, frequent falls, confusion, and weight loss was admitted for DKA, acute CVA and metabolic encephalopathy.  Prolonged hospital stay due to difficulty in SNF placement.   Assessment & Plan:   Principal Problem:   Failure to thrive in adult Active Problems:   Hypothyroidism   B12 deficiency   WOLFF (WOLFE)-PARKINSON-WHITE (WPW) SYNDROME   Essential hypertension   Hyperlipidemia   Poorly controlled type 2 diabetes mellitus (HCC)   Anxiety   Acute lower UTI   High anion gap metabolic acidosis   DKA (diabetic ketoacidoses) (HCC)   Cerebral thrombosis with cerebral infarction   DKA in type II DM, not at goal.  DKA resolved.  A1c 7.9.  CBGs currently reasonably controlled with mild intermittent increases on current regimen of Lantus 10 units at bedtime, NovoLog 4 units 3 times daily with meals, SSI and Tradjenta.  Continue current regimen.  Stable.  Essential hypertension Controlled on carvedilol, amlodipine, lisinopril.  Stable on current regimen.  WPW history  CVA  Neurology signed off 10/11  Continue statins, Plavix, adequate blood pressure control.  Awaiting SNF for rehab.  As per discussion with clinical social work 11/19, difficult placement due to lack of insurance.  Normocytic anemia/B12 and iron deficiency, menorrhagia  Hemoglobin normal.  Anxiety and agoraphobia  Continue Zoloft.  Stable.  Hypokalemia and hypomagnesemia  Replaced.  Thrombocytopenia  Platelet counts have dropped from 281 on 11/14to 49 on 11/18.  Repeated and normal.  Likely lab error.  Body mass index is 37.09 kg/m.   Nutritional Status Nutrition Problem: Inadequate oral intake Etiology: poor appetite Signs/Symptoms: per patient/family report Interventions: Prostat, Boost Breeze  DVT prophylaxis: Lovenox Code Status: DNR Family Communication: None at bedside Disposition: Awaiting SNF, difficult placement.   Consultants:  Neurology Psychiatry  Procedures:    Antimicrobials:  None   Subjective: Denies complaints.  As per RN, no acute issues noted.  Objective:  Vitals:   12/14/18 0335 12/14/18 0500 12/14/18 0811 12/14/18 1228  BP: 132/64  133/68 107/64  Pulse: 73  71 77  Resp: 18  16 16   Temp: 98.1 F (36.7 C)  98.2 F (36.8 C) 99 F (37.2 C)  TempSrc: Oral  Oral Oral  SpO2: 99%  97% 97%  Weight:  98 kg    Height:        Examination: No significant change in clinical exam compared to yesterday.  General exam: Pleasant young female lying comfortably in bed. Respiratory system: Clear to auscultation.  No increased work of breathing. Cardiovascular system: S1 & S2 heard, RRR. No JVD, murmurs, rubs, gallops or clicks. No pedal edema. Gastrointestinal system: Abdomen is nondistended, soft and nontender. No organomegaly or masses felt. Normal bowel sounds heard. Central nervous system: Alert and oriented. No focal neurological deficits. Extremities: Symmetric 5 x 5 power. Skin: No rashes, lesions or ulcers Psychiatry: Judgement and insight appear impaired. Mood & affect mostly flat.    Data Reviewed: I have personally reviewed following labs and imaging studies   CBC: Recent Labs  Lab 12/08/18 0313 12/12/18 0319 12/13/18 1039  WBC 9.3 6.3 9.0  NEUTROABS 6.8 4.0  --   HGB 12.2 13.2 12.8  HCT 35.5* 38.3 37.6  MCV 90.8 89.7 90.4  PLT 281 49* [id number]    Basic Metabolic Panel: Recent Labs  Lab 12/08/18 0313 12/12/18 0319  NA 138 137  K 3.6 4.6  CL 106 105  CO2 21* 21*  GLUCOSE 157* 130*  BUN 16 18  CREATININE 0.55 0.57  CALCIUM 9.0 9.3  MG 1.6* 1.9  PHOS 3.5  4.5    Liver Function Tests: Recent Labs  Lab 12/08/18 0313 12/12/18 0319  ALBUMIN 3.1* 3.1*    CBG: Recent Labs  Lab 12/13/18 1632 12/13/18 2122 12/14/18 0613 12/14/18 1128 12/14/18 1647  GLUCAP 136* 127* 177* 202* 134*    No results found for this or any previous visit (from the past 240 hour(s)).    Radiology Studies: No results found.        Scheduled Meds: . amLODipine  10 mg Oral Daily  . atorvastatin  80 mg Oral q1800  . carvedilol  6.25 mg Oral BID WC  . clopidogrel  75 mg Oral Daily  . enoxaparin (LOVENOX) injection  40 mg Subcutaneous Q24H  . feeding supplement  1 Container Oral BID BM  . feeding supplement (PRO-STAT SUGAR FREE 64)  30 mL Oral TID  . insulin aspart  0-15 Units Subcutaneous TID WC  . insulin aspart  0-5 Units Subcutaneous QHS  . insulin aspart  4 Units Subcutaneous TID WC  . insulin glargine  10 Units Subcutaneous QHS  . linagliptin  5 mg Oral Daily  . lisinopril  40 mg Oral Daily  . pantoprazole  40 mg Oral Daily  . sertraline  50 mg Oral Daily  . sodium chloride flush  10-40 mL Intracatheter Q12H  . thiamine  100 mg Oral Daily   Continuous Infusions:   LOS: 41 days     Vernell Leep, MD, [place], [facility]. [facility]  To contact the attending provider between 7A-7P or the covering provider during after hours 7P-7A, please log into the web site [email or web address] and access using universal [place] password for that web site. If you do not have the password, please call the hospital operator.  12/14/2018, 5:57 PM

## 2018-12-14 NOTE — Progress Notes (Addendum)
Physical Therapy Treatment Patient Details Name: Becky Stout MRN: [id number] DOB: [birth year] Today's Date: 12/14/2018    History of Present Illness 63 y.o. female with Past medical history of B12 deficiency, type II DM not on therapy, HTN, IDA, obesity, WPW syndrome.Pt dx with acute CVA, MRI reveals multiple cerebral and brainstem lacunar infarcts, acute metabolic encepholopathy, Hypertensive emergency, DKA, agorophobia, N/V, hypokalemia, and FTT.      PT Comments    Pt performed gt training back from recliner to bed.  She was able to recognize need to be cleaned and did stand long enough for pericare.  She returned to sitting and removed underwear and placement new ones.  She is more recepetive to mobility and moving well with staff.  Pt continues to benefit from SNF placement.  Returned this pm to assist nursing with back to bed since PTA has established rapport.     Follow Up Recommendations  SNF     Equipment Recommendations  Wheelchair (measurements PT);Wheelchair cushion (measurements PT);3in1 (PT);Rolling walker with 5" wheels    Recommendations for Other Services       Precautions / Restrictions Precautions Precautions: Fall Precaution Comments: extremely anxious, doesn't want to be touched (if at all possible) and fearful of falling Restrictions Weight Bearing Restrictions: No    Mobility  Bed Mobility Overal bed mobility: Needs Assistance Bed Mobility: Sit to Supine Rolling: Supervision    Sit to supine: Supervision   General bed mobility comments: Pt able to lower trunk and lift B LEs for back to bed.  Transfers Overall transfer level: Needs assistance Equipment used: Rolling walker (2 wheeled) Transfers: Sit to/from Stand Sit to Stand: Min assist         General transfer comment: min assistance from recliner with cues to push from recliner armrest.  Pt able to follow commands to stay standing for pericare before returning to  bed.  Ambulation/Gait Ambulation/Gait assistance: Min assist Gait Distance (Feet): 6 Feet Assistive device: Rolling walker (2 wheeled) Gait Pattern/deviations: Step-to pattern;Shuffle;Trunk flexed     General Gait Details: Pt able to take steps back to bed, mild LOB backing to surface but able to correct with min assistance.   Stairs             Wheelchair Mobility    Modified Rankin (Stroke Patients Only) Modified Rankin (Stroke Patients Only) Pre-Morbid Rankin Score: Moderate disability Modified Rankin: Moderately severe disability     Balance Overall balance assessment: Needs assistance Sitting-balance support: Feet supported;Bilateral upper extremity supported Sitting balance-Leahy Scale: Fair       Standing balance-Leahy Scale: Poor Standing balance comment: reliant on BUE support on RW                            Cognition Arousal/Alertness: Awake/alert Behavior During Therapy: Anxious Overall Cognitive Status: No family/caregiver present to determine baseline cognitive functioning Area of Impairment: Following commands;Safety/judgement;Awareness;Problem solving                 Orientation Level: Situation;Disoriented to Current Attention Level: Sustained Memory: Decreased recall of precautions;Decreased short-term memory Following Commands: Follows one step commands with increased time;Follows one step commands inconsistently Safety/Judgement: Decreased awareness of safety;Decreased awareness of deficits Awareness: Emergent Problem Solving: Slow processing;Decreased initiation;Difficulty sequencing;Requires verbal cues;Requires tactile cues General Comments: Pt continues to self limit due to poor safety awareness and cognition.  She remains extremely limited due to fear of falling.      Exercises  General Comments        Pertinent Vitals/Pain Pain Assessment: No/denies pain    Home Living                       Prior Function            PT Goals (current goals can now be found in the care plan section) Acute Rehab PT Goals Patient Stated Goal: to not stand up. Potential to Achieve Goals: Fair Progress towards PT goals: Progressing toward goals    Frequency    Min 3X/week      PT Plan Current plan remains appropriate    Co-evaluation              AM-PAC PT "6 Clicks" Mobility   Outcome Measure  Help needed turning from your back to your side while in a flat bed without using bedrails?: A [place] Help needed moving from lying on your back to sitting on the side of a flat bed without using bedrails?: A [place] Help needed moving to and from a bed to a chair (including a wheelchair)?: A [place] Help needed standing up from a chair using your arms (e.g., wheelchair or bedside chair)?: A [place] Help needed to walk in hospital room?: A [place] Help needed climbing 3-5 steps with a railing? : A [place] 6 Click Score: 17    End of Session Equipment Utilized During Treatment: Gait belt Activity Tolerance: Patient limited by fatigue Patient left: with call bell/phone within reach;in chair;with chair alarm set Nurse Communication: Mobility status PT Visit Diagnosis: Other abnormalities of gait and mobility (R26.89)     Time: [id number] PT Time Calculation (min) (ACUTE ONLY): 13 min  Charges:  $Therapeutic Activity: 8-22 mins                     Erasmo Leventhal , PTA Acute Rehabilitation Services Pager [phone] Office [phone]     [place] Schroader [name] 12/14/2018, 3:36 PM

## 2018-12-15 LAB — GLUCOSE, CAPILLARY
Glucose-Capillary: 181 mg/dL — ABNORMAL HIGH (ref 70–99)
Glucose-Capillary: 254 mg/dL — ABNORMAL HIGH (ref 70–99)
Glucose-Capillary: 120 mg/dL — ABNORMAL HIGH (ref 70–99)
Glucose-Capillary: 150 mg/dL — ABNORMAL HIGH (ref 70–99)

## 2018-12-15 NOTE — Progress Notes (Signed)
PROGRESS NOTE   Becky Stout  [id number]    DOB: March 30, 1955    DOA: 11/03/2018  PCP: Abner Greenspan, MD   I have briefly reviewed patients previous medical records in [facility].  Chief Complaint  Patient presents with  . Nausea  . Emesis  . Altered Mental Status    Brief Narrative:  63 year old female with PMH of DM 2, HTN, WPW syndrome, frequent falls, confusion, and weight loss was admitted for DKA, acute CVA and metabolic encephalopathy.  Prolonged hospital stay due to difficulty in SNF placement.  No new or acute issues reported by patient or nursing.  Remains stable without new complaints or exam findings.   Assessment & Plan:   Principal Problem:   Failure to thrive in adult Active Problems:   Hypothyroidism   B12 deficiency   WOLFF (WOLFE)-PARKINSON-WHITE (WPW) SYNDROME   Essential hypertension   Hyperlipidemia   Poorly controlled type 2 diabetes mellitus (HCC)   Anxiety   Acute lower UTI   High anion gap metabolic acidosis   DKA (diabetic ketoacidoses) (HCC)   Cerebral thrombosis with cerebral infarction   DKA in type II DM, not at goal.  DKA resolved.  A1c 7.9.  CBGs currently reasonably controlled with mild intermittent increases on current regimen of Lantus 10 units at bedtime, NovoLog 4 units 3 times daily with meals, SSI and Tradjenta.  Continue current regimen.  Stable.  Essential hypertension Controlled on carvedilol, amlodipine, lisinopril.  Stable on current regimen.  WPW history  CVA  Neurology signed off 10/11  Continue statins, Plavix, adequate blood pressure control.  Awaiting SNF for rehab.  As per discussion with clinical social work 11/19, difficult placement due to lack of insurance.  Normocytic anemia/B12 and iron deficiency, menorrhagia  Hemoglobin normal.  Anxiety and agoraphobia  Continue Zoloft.  Stable.  Hypokalemia and hypomagnesemia  Replaced.  Thrombocytopenia  Platelet counts have dropped from  281 on 11/14to 49 on 11/18.  Repeated and normal.  Likely lab error.  Body mass index is 37.46 kg/m.  Nutritional Status Nutrition Problem: Inadequate oral intake Etiology: poor appetite Signs/Symptoms: per patient/family report Interventions: Prostat, Boost Breeze  DVT prophylaxis: Lovenox Code Status: DNR Family Communication: None at bedside Disposition: Awaiting SNF, difficult placement.   Consultants:  Neurology Psychiatry  Procedures:    Antimicrobials:  None   Subjective: Denies complaints.  As per RN, no acute issues noted.  Objective:  Vitals:   12/15/18 0437 12/15/18 0828 12/15/18 1143 12/15/18 1539  BP:  (!) 146/52 118/61 120/61  Pulse:  78 69 68  Resp:  18 18 18   Temp:  98.6 F (37 C) 98.2 F (36.8 C) 98.2 F (36.8 C)  TempSrc:  Oral Oral Oral  SpO2:  96% 97% 98%  Weight: 99 kg     Height:        Examination:  General exam: Pleasant young female lying comfortably in bed. Respiratory system: Clear to auscultation.  No increased work of breathing. Cardiovascular system: S1 & S2 heard, RRR. No JVD, murmurs, rubs, gallops or clicks. No pedal edema. Gastrointestinal system: Abdomen is nondistended, soft and nontender. No organomegaly or masses felt. Normal bowel sounds heard. Central nervous system: Alert and oriented. No focal neurological deficits. Extremities: Symmetric 5 x 5 power. Skin: No rashes, lesions or ulcers Psychiatry: Judgement and insight appear impaired. Mood & affect mostly flat.    Data Reviewed: I have personally reviewed following labs and imaging studies   CBC: Recent Labs  Lab  12/12/18 0319 12/13/18 1039  WBC 6.3 9.0  NEUTROABS 4.0  --   HGB 13.2 12.8  HCT 38.3 37.6  MCV 89.7 90.4  PLT 49* [id number]    Basic Metabolic Panel: Recent Labs  Lab 12/12/18 0319  NA 137  K 4.6  CL 105  CO2 21*  GLUCOSE 130*  BUN 18  CREATININE 0.57  CALCIUM 9.3  MG 1.9  PHOS 4.5    Liver Function Tests: Recent Labs  Lab  12/12/18 0319  ALBUMIN 3.1*    CBG: Recent Labs  Lab 12/14/18 1647 12/14/18 2104 12/15/18 0610 12/15/18 1142 12/15/18 1628  GLUCAP 134* 160* 181* 254* 120*    No results found for this or any previous visit (from the past 240 hour(s)).    Radiology Studies: No results found.        Scheduled Meds: . amLODipine  10 mg Oral Daily  . atorvastatin  80 mg Oral q1800  . carvedilol  6.25 mg Oral BID WC  . clopidogrel  75 mg Oral Daily  . enoxaparin (LOVENOX) injection  40 mg Subcutaneous Q24H  . feeding supplement  1 Container Oral BID BM  . feeding supplement (PRO-STAT SUGAR FREE 64)  30 mL Oral TID  . insulin aspart  0-15 Units Subcutaneous TID WC  . insulin aspart  0-5 Units Subcutaneous QHS  . insulin aspart  4 Units Subcutaneous TID WC  . insulin glargine  10 Units Subcutaneous QHS  . linagliptin  5 mg Oral Daily  . lisinopril  40 mg Oral Daily  . pantoprazole  40 mg Oral Daily  . sertraline  50 mg Oral Daily  . sodium chloride flush  10-40 mL Intracatheter Q12H  . thiamine  100 mg Oral Daily   Continuous Infusions:   LOS: 42 days     Vernell Leep, MD, [place], [facility]. [facility]  To contact the attending provider between 7A-7P or the covering provider during after hours 7P-7A, please log into the web site [email or web address] and access using universal [place] password for that web site. If you do not have the password, please call the hospital operator.  12/15/2018, 5:27 PM

## 2018-12-16 LAB — GLUCOSE, CAPILLARY
Glucose-Capillary: 165 mg/dL — ABNORMAL HIGH (ref 70–99)
Glucose-Capillary: 181 mg/dL — ABNORMAL HIGH (ref 70–99)
Glucose-Capillary: 121 mg/dL — ABNORMAL HIGH (ref 70–99)
Glucose-Capillary: 149 mg/dL — ABNORMAL HIGH (ref 70–99)

## 2018-12-16 NOTE — Progress Notes (Signed)
PROGRESS NOTE   Becky Stout  [id number]    DOB: 1956/01/04    DOA: 11/03/2018  PCP: Abner Greenspan, MD   I have briefly reviewed patients previous medical records in [facility].  Chief Complaint  Patient presents with  . Nausea  . Emesis  . Altered Mental Status    Brief Narrative:  63 year old female with PMH of DM 2, HTN, WPW syndrome, frequent falls, confusion, and weight loss was admitted for DKA, acute CVA and metabolic encephalopathy.  Prolonged hospital stay due to difficulty in SNF placement.  No new or acute issues ongoing per patient or nursing for several days now.  Remains hospitalized due to lack of safe discharge plan as noted above.   Assessment & Plan:   Principal Problem:   Failure to thrive in adult Active Problems:   Hypothyroidism   B12 deficiency   WOLFF (WOLFE)-PARKINSON-WHITE (WPW) SYNDROME   Essential hypertension   Hyperlipidemia   Poorly controlled type 2 diabetes mellitus (HCC)   Anxiety   Acute lower UTI   High anion gap metabolic acidosis   DKA (diabetic ketoacidoses) (HCC)   Cerebral thrombosis with cerebral infarction   DKA in type II DM, not at goal.  DKA resolved.  A1c 7.9.  CBGs currently reasonably controlled with mild intermittent increases on current regimen of Lantus 10 units at bedtime, NovoLog 4 units 3 times daily with meals, SSI and Tradjenta.  Continue current regimen.  Stable.  Essential hypertension Controlled on carvedilol, amlodipine, lisinopril.  Stable on current regimen.  WPW history  CVA  Neurology signed off 10/11  Continue statins, Plavix, adequate blood pressure control.  Awaiting SNF for rehab.  As per discussion with clinical social work 11/19, difficult placement due to lack of insurance.  Normocytic anemia/B12 and iron deficiency, menorrhagia  Hemoglobin normal.  Anxiety and agoraphobia  Continue Zoloft.  Stable.  Hypokalemia and hypomagnesemia  Replaced.  Thrombocytopenia   Platelet counts have dropped from 281 on 11/14to 49 on 11/18.  Repeated and normal.  Likely lab error.  Body mass index is 37.46 kg/m.  Nutritional Status Nutrition Problem: Inadequate oral intake Etiology: poor appetite Signs/Symptoms: per patient/family report Interventions: Prostat, Boost Breeze  DVT prophylaxis: Lovenox Code Status: DNR Family Communication: None at bedside Disposition: Awaiting SNF, difficult placement.   Consultants:  Neurology Psychiatry  Procedures:    Antimicrobials:  None   Subjective: Denies complaints.  As per RN, no acute issues noted.  Objective:  Vitals:   12/16/18 0350 12/16/18 0500 12/16/18 0824 12/16/18 1133  BP: 138/66  137/77 (!) 141/76  Pulse: 73  73 71  Resp: 16  15 19   Temp: 98.4 F (36.9 C)  98.4 F (36.9 C) 99.4 F (37.4 C)  TempSrc: Oral  Oral Oral  SpO2: 98%  96% 97%  Weight:  99 kg    Height:        Examination:  General exam: Pleasant young female lying comfortably in bed. Respiratory system: Clear to auscultation.  No increased work of breathing. Cardiovascular system: S1 & S2 heard, RRR. No JVD, murmurs, rubs, gallops or clicks. No pedal edema. Gastrointestinal system: Abdomen is nondistended, soft and nontender. No organomegaly or masses felt. Normal bowel sounds heard. Central nervous system: Alert and oriented. No focal neurological deficits. Extremities: Symmetric 5 x 5 power. Skin: No rashes, lesions or ulcers Psychiatry: Judgement and insight appear impaired. Mood & affect mostly flat.    Data Reviewed: I have personally reviewed following labs and imaging  studies   CBC: Recent Labs  Lab 12/12/18 0319 12/13/18 1039  WBC 6.3 9.0  NEUTROABS 4.0  --   HGB 13.2 12.8  HCT 38.3 37.6  MCV 89.7 90.4  PLT 49* 99991111    Basic Metabolic Panel: Recent Labs  Lab 12/12/18 0319  NA 137  K 4.6  CL 105  CO2 21*  GLUCOSE 130*  BUN 18  CREATININE 0.57  CALCIUM 9.3  MG 1.9  PHOS 4.5    Liver  Function Tests: Recent Labs  Lab 12/12/18 0319  ALBUMIN 3.1*    CBG: Recent Labs  Lab 12/15/18 1142 12/15/18 1628 12/15/18 2117 12/16/18 0604 12/16/18 1135  GLUCAP 254* 120* 150* 165* 181*    No results found for this or any previous visit (from the past 240 hour(s)).    Radiology Studies: No results found.        Scheduled Meds: . amLODipine  10 mg Oral Daily  . atorvastatin  80 mg Oral q1800  . carvedilol  6.25 mg Oral BID WC  . clopidogrel  75 mg Oral Daily  . enoxaparin (LOVENOX) injection  40 mg Subcutaneous Q24H  . feeding supplement  1 Container Oral BID BM  . feeding supplement (PRO-STAT SUGAR FREE 64)  30 mL Oral TID  . insulin aspart  0-15 Units Subcutaneous TID WC  . insulin aspart  0-5 Units Subcutaneous QHS  . insulin aspart  4 Units Subcutaneous TID WC  . insulin glargine  10 Units Subcutaneous QHS  . linagliptin  5 mg Oral Daily  . lisinopril  40 mg Oral Daily  . pantoprazole  40 mg Oral Daily  . sertraline  50 mg Oral Daily  . sodium chloride flush  10-40 mL Intracatheter Q12H  . thiamine  100 mg Oral Daily   Continuous Infusions:   LOS: 47 days     Vernell Leep, MD, [place], [facility]. [facility]  To contact the attending provider between 7A-7P or the covering provider during after hours 7P-7A, please log into the web site [email or web address] and access using universal [place] password for that web site. If you do not have the password, please call the hospital operator.  12/16/2018, 2:34 PM

## 2018-12-17 LAB — GLUCOSE, CAPILLARY
Glucose-Capillary: 174 mg/dL — ABNORMAL HIGH (ref 70–99)
Glucose-Capillary: 248 mg/dL — ABNORMAL HIGH (ref 70–99)
Glucose-Capillary: 109 mg/dL — ABNORMAL HIGH (ref 70–99)
Glucose-Capillary: 163 mg/dL — ABNORMAL HIGH (ref 70–99)

## 2018-12-17 NOTE — Progress Notes (Signed)
PROGRESS NOTE   Becky Stout Diss  [id number]    DOB: 1955/02/09    DOA: 11/03/2018  PCP: Abner Greenspan, MD   I have briefly reviewed patients previous medical records in [facility]  Chief Complaint  Patient presents with  . Nausea  . Emesis  . Altered Mental Status    Brief Narrative:  63 year old female with PMH of DM 2, HTN, WPW syndrome, frequent falls, confusion, and weight loss was admitted for DKA, acute CVA and metabolic encephalopathy.  Prolonged hospital stay due to difficulty in SNF placement.  No new or acute issues ongoing per patient or nursing for several days now.  Remains hospitalized due to lack of safe discharge plan as noted above.   Assessment & Plan:   Principal Problem:   Failure to thrive in adult Active Problems:   Hypothyroidism   B12 deficiency   WOLFF (WOLFE)-PARKINSON-WHITE (WPW) SYNDROME   Essential hypertension   Hyperlipidemia   Poorly controlled type 2 diabetes mellitus (HCC)   Anxiety   Acute lower UTI   High anion gap metabolic acidosis   DKA (diabetic ketoacidoses) (HCC)   Cerebral thrombosis with cerebral infarction   DKA in type II DM, not at goal.  DKA resolved.  A1c 7.9.  CBGs currently reasonably controlled with mild intermittent increases on current regimen of Lantus 10 units at bedtime, NovoLog 4 units 3 times daily with meals, SSI and Tradjenta.  Continue current regimen.  Stable.  Essential hypertension Controlled on carvedilol, amlodipine, lisinopril.  Stable.  WPW history  CVA  Neurology signed off 10/11  Continue statins, Plavix, adequate blood pressure control.  Awaiting SNF for rehab.  As per discussion with clinical social work 11/19, difficult placement due to lack of insurance.  Normocytic anemia/B12 and iron deficiency, menorrhagia  Hemoglobin normal.  Anxiety and agoraphobia  Continue Zoloft.  Stable.  Hypokalemia and hypomagnesemia  Replaced.  Thrombocytopenia  Platelet counts  have dropped from 281 on 11/14to 49 on 11/18.  Repeated and normal.  Likely lab error.  Body mass index is 37.43 kg/m.  Nutritional Status Nutrition Problem: Inadequate oral intake Etiology: poor appetite Signs/Symptoms: per patient/family report Interventions: Prostat, Boost Breeze  DVT prophylaxis: Lovenox Code Status: DNR Family Communication: None at bedside Disposition: Awaiting SNF, difficult placement.   Consultants:  Neurology Psychiatry  Procedures:    Antimicrobials:  None   Subjective: No new complaints.  Objective:  Vitals:   12/17/18 0417 12/17/18 0800 12/17/18 1200 12/17/18 1600  BP:  135/66 132/72 136/77  Pulse:  73 74 72  Resp:  18 16 18   Temp:  98.1 F (36.7 C) 98.3 F (36.8 C) 98.3 F (36.8 C)  TempSrc:  Oral Oral Oral  SpO2:  97% 98% 98%  Weight: 98.9 kg     Height:        Examination:  General exam: Pleasant young female lying comfortably in bed. Respiratory system: Clear to auscultation.  No increased work of breathing. Cardiovascular system: S1 & S2 heard, RRR. No JVD, murmurs, rubs, gallops or clicks. No pedal edema. Gastrointestinal system: Abdomen is nondistended, soft and nontender. No organomegaly or masses felt. Normal bowel sounds heard. Central nervous system: Alert and oriented. No focal neurological deficits. Extremities: Symmetric 5 x 5 power. Skin: No rashes, lesions or ulcers Psychiatry: Judgement and insight appear impaired. Mood & affect mostly pleasant.    Data Reviewed: I have personally reviewed following labs and imaging studies   CBC: Recent Labs  Lab 12/12/18 0319 12/13/18  1039  WBC 6.3 9.0  NEUTROABS 4.0  --   HGB 13.2 12.8  HCT 38.3 37.6  MCV 89.7 90.4  PLT 49* 99991111    Basic Metabolic Panel: Recent Labs  Lab 12/12/18 0319  NA 137  K 4.6  CL 105  CO2 21*  GLUCOSE 130*  BUN 18  CREATININE 0.57  CALCIUM 9.3  MG 1.9  PHOS 4.5    Liver Function Tests: Recent Labs  Lab 12/12/18 0319   ALBUMIN 3.1*    CBG: Recent Labs  Lab 12/16/18 1552 12/16/18 2102 12/17/18 0605 12/17/18 1136 12/17/18 1619  GLUCAP 121* 149* 174* 248* 109*    No results found for this or any previous visit (from the past 240 hour(s)).    Radiology Studies: No results found.        Scheduled Meds: . amLODipine  10 mg Oral Daily  . atorvastatin  80 mg Oral q1800  . carvedilol  6.25 mg Oral BID WC  . clopidogrel  75 mg Oral Daily  . enoxaparin (LOVENOX) injection  40 mg Subcutaneous Q24H  . feeding supplement  1 Container Oral BID BM  . feeding supplement (PRO-STAT SUGAR FREE 64)  30 mL Oral TID  . insulin aspart  0-15 Units Subcutaneous TID WC  . insulin aspart  0-5 Units Subcutaneous QHS  . insulin aspart  4 Units Subcutaneous TID WC  . insulin glargine  10 Units Subcutaneous QHS  . linagliptin  5 mg Oral Daily  . lisinopril  40 mg Oral Daily  . pantoprazole  40 mg Oral Daily  . sertraline  50 mg Oral Daily  . sodium chloride flush  10-40 mL Intracatheter Q12H  . thiamine  100 mg Oral Daily   Continuous Infusions:   LOS: 110 days     Vernell Leep, MD, [place], [facility]. [facility]  To contact the attending provider between 7A-7P or the covering provider during after hours 7P-7A, please log into the web site [email or web address] and access using universal [place] password for that web site. If you do not have the password, please call the hospital operator.  12/17/2018, 6:36 PM

## 2018-12-18 LAB — GLUCOSE, CAPILLARY
Glucose-Capillary: 172 mg/dL — ABNORMAL HIGH (ref 70–99)
Glucose-Capillary: 185 mg/dL — ABNORMAL HIGH (ref 70–99)
Glucose-Capillary: 143 mg/dL — ABNORMAL HIGH (ref 70–99)
Glucose-Capillary: 223 mg/dL — ABNORMAL HIGH (ref 70–99)

## 2018-12-18 NOTE — Progress Notes (Signed)
Occupational Therapy Treatment Patient Details Name: Becky Stout MRN: [id number] DOB: 10/17/1955 Today's Date: 12/18/2018    History of present illness 63 y.o. female with Past medical history of B12 deficiency, type II DM not on therapy, HTN, IDA, obesity, WPW syndrome.Pt dx with acute CVA, MRI reveals multiple cerebral and brainstem lacunar infarcts, acute metabolic encepholopathy, Hypertensive emergency, DKA, agorophobia, N/V, hypokalemia, and FTT.     OT comments  Pt received in bed, reluctantly agreeable to OT session. Pt able to open containers on lunch with increased time and effort. Pt required minA for stand-pivot transfer from EOB to recliner. Pt will continue to benefit from skilled OT services to maximize safety and independence with ADL/IADL and functional mobility. Will continue to follow acutely and progress as tolerated.    Follow Up Recommendations  SNF    Equipment Recommendations  Other (comment)(defer to next venue)    Recommendations for Other Services      Precautions / Restrictions Precautions Precautions: Fall Precaution Comments: extremely anxious, doesn't want to be touched (if at all possible) and fearful of falling Restrictions Weight Bearing Restrictions: No       Mobility Bed Mobility Overal bed mobility: Needs Assistance Bed Mobility: Sit to Supine       Sit to supine: Supervision   General bed mobility comments: progressed to EOB with supervision  Transfers Overall transfer level: Needs assistance Equipment used: Rolling walker (2 wheeled) Transfers: Sit to/from [facility] Sit to Stand: Min assist Stand pivot transfers: Min assist       General transfer comment: pt requested therapist provide hands on assistance during mobility    Balance Overall balance assessment: Needs assistance Sitting-balance support: Feet supported;Bilateral upper extremity supported Sitting balance-Leahy Scale: Fair Sitting balance -  Comments: BUE support due to fear of falling   Standing balance support: Bilateral upper extremity supported Standing balance-Leahy Scale: Poor Standing balance comment: reliant on BUE support on RW                           ADL either performed or assessed with clinical judgement   ADL Overall ADL's : Needs assistance/impaired Eating/Feeding: Set up;Sitting Eating/Feeding Details (indicate cue type and reason): pt required increased time and effort to open containers                     Toilet Transfer: Minimal assistance;Stand-pivot;RW Toilet Transfer Details (indicate cue type and reason): simulated stand pivot from EOB to recliner, pt with extreme fear of falling         Functional mobility during ADLs: Minimal assistance;Rolling walker General ADL Comments: pt did request therapist to provide hands on assistance this session during mobility     Vision       Perception     Praxis      Cognition Arousal/Alertness: Awake/alert Behavior During Therapy: Anxious Overall Cognitive Status: No family/caregiver present to determine baseline cognitive functioning Area of Impairment: Following commands;Safety/judgement;Awareness;Problem solving                       Following Commands: Follows one step commands with increased time;Follows one step commands inconsistently Safety/Judgement: Decreased awareness of safety;Decreased awareness of deficits Awareness: Emergent Problem Solving: Slow processing;Decreased initiation;Difficulty sequencing;Requires verbal cues;Requires tactile cues General Comments: Pt continues to self limit due to poor safety awareness and cognition.  She remains extremely limited due to fear of falling.  Exercises     Shoulder Instructions       General Comments vss    Pertinent Vitals/ Pain       Pain Assessment: No/denies pain Pain Intervention(s): Monitored during session  Home Living                                           Prior Functioning/Environment              Frequency  Min 2X/week        Progress Toward Goals  OT Goals(current goals can now be found in the care plan section)  Progress towards OT goals: Progressing toward goals  Acute Rehab OT Goals Patient Stated Goal: to sleep OT Goal Formulation: With patient Time For Goal Achievement: 12/25/18 Potential to Achieve Goals: Good ADL Goals Pt Will Perform Grooming: with supervision;sitting Pt Will Perform Upper Body Dressing: with set-up;with supervision;sitting Pt Will Perform Lower Body Dressing: with min assist;sit to/from stand Pt Will Transfer to Toilet: with min assist;stand pivot transfer;bedside commode Additional ADL Goal #1: Pt will follow 1 step commands consistently with min cues Additional ADL Goal #2: Pt will require min cues for sequencing simple, familiar grooming task  Plan Discharge plan remains appropriate    Co-evaluation                 AM-PAC OT "6 Clicks" Daily Activity     Outcome Measure   Help from another person eating meals?: A Holsinger Help from another person taking care of personal grooming?: A Vanacker Help from another person toileting, which includes using toliet, bedpan, or urinal?: A Todaro Help from another person bathing (including washing, rinsing, drying)?: A Pensyl Help from another person to put on and taking off regular upper body clothing?: A Royall Help from another person to put on and taking off regular lower body clothing?: A Lot 6 Click Score: 17    End of Session Equipment Utilized During Treatment: Rolling walker  OT Visit Diagnosis: Unsteadiness on feet (R26.81);Other abnormalities of gait and mobility (R26.89);Muscle weakness (generalized) (M62.81);History of falling (Z91.81);Other symptoms and signs involving cognitive function;Adult, failure to thrive (R62.7)   Activity Tolerance Patient tolerated treatment well   Patient Left  in chair;with call bell/phone within reach;with chair alarm set   Nurse Communication Mobility status        Time: [id number] OT Time Calculation (min): 22 min  Charges: OT General Charges $OT Visit: 1 Visit OT Treatments $Self Care/Home Management : 8-22 mins  Dorinda Hill OTR/L [place] Office: [place] 12/18/2018, 1:09 PM

## 2018-12-18 NOTE — Progress Notes (Signed)
Nutrition Brief Note  63 y.o. female with Past medical history of B12 deficiency, type II DM not on therapy, HTN, CHF, IDA, obesity, WPW syndrome.Patient was brought into secondary to increasing confusion as well as failure to thrive and a fall.62 y.o. female with Past medical history of B12 deficiency, type II DM not on therapy, HTN, IDA, obesity, WPW syndrome.Patient was brought into secondary to increasing confusion as well as failure to thrive and a fall. Scattered small acute subcortical infarcts in both cerebral hemispheres and brainstem.  Meal completion has been 100%. Pt currently has Boost Breeze and Prostat ordered and has been consuming them. Per MD and RN, pt with no new or acute issues ongoing. Pt remains hospitalized due to lack of safe discharge plan. Pt awaiting SNF placement. No further nutrition interventions at this time. If nutritional issues arise, please re-consult RD.   Corrin Parker, MS, RD, LDN Pager # [phone] After hours/ weekend pager # [phone]

## 2018-12-18 NOTE — Progress Notes (Signed)
Physical Therapy Treatment Patient Details Name: Becky Stout MRN: [id number] DOB: 01-19-1956 Today's Date: 12/18/2018    History of Present Illness 63 y.o. female with Past medical history of B12 deficiency, type II DM not on therapy, HTN, IDA, obesity, WPW syndrome.Pt dx with acute CVA, MRI reveals multiple cerebral and brainstem lacunar infarcts, acute metabolic encepholopathy, Hypertensive emergency, DKA, agorophobia, N/V, hypokalemia, and FTT.      PT Comments    Pt performed progression of gt with chair moved further away from her bed.  She experienced heightened axiety but with redirection she relaxed and was able to complete the tasks.  Pt continues to lack strength and would benefit from skilled rehab placement to improve strength and function before returning home.     Follow Up Recommendations  SNF     Equipment Recommendations  Wheelchair (measurements PT);Wheelchair cushion (measurements PT);3in1 (PT);Rolling walker with 5" wheels    Recommendations for Other Services       Precautions / Restrictions Precautions Precautions: Fall Precaution Comments: extremely anxious, doesn't want to be touched (if at all possible) and fearful of falling Restrictions Weight Bearing Restrictions: No    Mobility  Bed Mobility Overal bed mobility: Needs Assistance Bed Mobility: Sit to Supine Rolling: Supervision     Sit to supine: Supervision   General bed mobility comments: Pt required supervision to roll and return to bed.  She reports inability to boost to hob but able to bridge and scoot with min assistance and max VCs  Transfers Overall transfer level: Needs assistance Equipment used: Rolling walker (2 wheeled) Transfers: Stand Pivot Transfers Sit to Stand: Min assist Stand pivot transfers: Min assist       General transfer comment: Cues for hand placement to and from seated surface.  Pt continues to lack ability to move RW and square hips before sitting therefore  she still sits impulsively.  Ambulation/Gait Ambulation/Gait assistance: Min assist Gait Distance (Feet): 8 Feet Assistive device: Rolling walker (2 wheeled) Gait Pattern/deviations: Step-to pattern;Shuffle;Trunk flexed;Wide base of support     General Gait Details: Cues for safety and RW use to maintain close proximity.  Assistance for turning and backing.   Stairs             Wheelchair Mobility    Modified Rankin (Stroke Patients Only) Modified Rankin (Stroke Patients Only) Pre-Morbid Rankin Score: Moderate disability Modified Rankin: Moderately severe disability     Balance Overall balance assessment: Needs assistance Sitting-balance support: Feet supported;Bilateral upper extremity supported Sitting balance-Leahy Scale: Fair Sitting balance - Comments: BUE support due to fear of falling   Standing balance support: Bilateral upper extremity supported Standing balance-Leahy Scale: Poor Standing balance comment: reliant on BUE support on RW                            Cognition Arousal/Alertness: Awake/alert Behavior During Therapy: Anxious Overall Cognitive Status: No family/caregiver present to determine baseline cognitive functioning Area of Impairment: Following commands;Safety/judgement;Awareness;Problem solving                       Following Commands: Follows one step commands with increased time;Follows one step commands inconsistently Safety/Judgement: Decreased awareness of safety;Decreased awareness of deficits Awareness: Emergent Problem Solving: Slow processing;Decreased initiation;Difficulty sequencing;Requires verbal cues;Requires tactile cues General Comments: Pt continues to self limit due to poor safety awareness and cognition.  She remains extremely limited due to fear of falling.  Exercises General Exercises - Lower Extremity Ankle Circles/Pumps: AROM;Both;10 reps;Supine;AAROM Heel Slides: AROM;Both;10  reps;Supine Straight Leg Raises: AAROM;Both;10 reps;Supine    General Comments General comments (skin integrity, edema, etc.): vss      Pertinent Vitals/Pain Pain Assessment: No/denies pain Faces Pain Scale: Hurts Paolillo more Pain Location: generalized discomfort. Pain Descriptors / Indicators: Burning Pain Intervention(s): Monitored during session;Repositioned    Home Living                      Prior Function            PT Goals (current goals can now be found in the care plan section) Acute Rehab PT Goals Patient Stated Goal: to sleep Potential to Achieve Goals: Fair Progress towards PT goals: Progressing toward goals    Frequency    Min 3X/week      PT Plan Current plan remains appropriate    Co-evaluation              AM-PAC PT "6 Clicks" Mobility   Outcome Measure  Help needed turning from your back to your side while in a flat bed without using bedrails?: A Cangemi Help needed moving from lying on your back to sitting on the side of a flat bed without using bedrails?: A Vanvranken Help needed moving to and from a bed to a chair (including a wheelchair)?: A Roh Help needed standing up from a chair using your arms (e.g., wheelchair or bedside chair)?: A [place]dale Help needed to walk in hospital room?: A Seubert Help needed climbing 3-5 steps with a railing? : A Lot 6 Click Score: 17    End of Session Equipment Utilized During Treatment: Gait belt Activity Tolerance: Patient limited by fatigue Patient left: with call bell/phone within reach;in chair;with chair alarm set Nurse Communication: Mobility status PT Visit Diagnosis: Other abnormalities of gait and mobility (R26.89)     Time: [id number] PT Time Calculation (min) (ACUTE ONLY): 24 min  Charges:  $Gait Training: 8-22 mins $Therapeutic Exercise: 8-22 mins                     Erasmo Leventhal , PTA Acute Rehabilitation Services Pager [phone] Office [phone]     Simon Llamas [name] 12/18/2018, 2:28 PM

## 2018-12-18 NOTE — Progress Notes (Signed)
  Speech Language Pathology Treatment: Cognitive-Linquistic  Patient Details Name: Becky Stout MRN: [id number] DOB: 03/29/1955 Today's Date: 12/18/2018 Time: [id number] SLP Time Calculation (min) (ACUTE ONLY): 19 min  Assessment / Plan / Recommendation Clinical Impression  Pt sitting in chair during cognitive treatment. Max cues needed for mental flexibility due to very concrete cognition, poor verbal problem solving/reasoning and significantly decreased anticipatory awareness. Short term memory deficits and safety awareness. Pt reports if RN did not return at "two like she said" pt would "hang on to the table to get back in bed." Reported she has never used the call button in the 6 weeks she has been in hospital. Given many semantic and numeric cues "9" she was unable to verbalize 911 in emergency. Has significant difficulty disassociating herself to respond to hypothetical problem solving situations. Given mod-max cues she could calculate length of hospital stay and how long until she is allowed to go back to bed. Pt continues to need 24 hour supervision.Per documentation she has had 2 prior TBI's and is likely not far from baseline. Recommend she have evaluation and treatment at next venue of care. Will sign off at this time.    HPI HPI: 63 y.o. female with Past medical history of B12 deficiency, type II DM not on therapy, HTN, IDA, obesity, WPW syndrome. Patient was brought in secondary to increasing confusion as well as failure to thrive and a fall.  MRI revealed scattered small acute infarcts in both cerebral hemispheres and brainstem and chronic basal ganglia infarcts and moderately advanced cerebral atrophy.      SLP Plan  Continue with current plan of care       Recommendations                   Follow up Recommendations: Skilled Nursing facility;24 hour supervision/assistance Plan: Continue with current plan of care       GO                Becky Stout 12/18/2018, 2:09 PM  Becky Stout.Ed Risk analyst [phone] Office [phone]

## 2018-12-18 NOTE — Progress Notes (Signed)
PROGRESS NOTE   Becky Stout  [id number]    DOB: Dec 31, 1955    DOA: 11/03/2018  PCP: Abner Greenspan, MD   I have briefly reviewed patients previous medical records in [facility].  Chief Complaint  Patient presents with  . Nausea  . Emesis  . Altered Mental Status    Brief Narrative:  63 year old female with PMH of DM 2, HTN, WPW syndrome, frequent falls, confusion, and weight loss was admitted for DKA, acute CVA and metabolic encephalopathy.  Prolonged hospital stay due to difficulty in SNF placement.  No new or acute issues ongoing per patient or nursing for several days now.  Remains hospitalized due to lack of safe discharge plan as noted above.   Assessment & Plan:   Principal Problem:   Failure to thrive in adult Active Problems:   Hypothyroidism   B12 deficiency   WOLFF (WOLFE)-PARKINSON-WHITE (WPW) SYNDROME   Essential hypertension   Hyperlipidemia   Poorly controlled type 2 diabetes mellitus (HCC)   Anxiety   Acute lower UTI   High anion gap metabolic acidosis   DKA (diabetic ketoacidoses) (HCC)   Cerebral thrombosis with cerebral infarction   DKA in type II DM, not at goal.  DKA resolved.  A1c 7.9.  CBGs currently reasonably controlled with mild intermittent increases on current regimen of Lantus 10 units at bedtime, NovoLog 4 units 3 times daily with meals, SSI and Tradjenta.  Continue current regimen.  Stable.  Essential hypertension Controlled on carvedilol, amlodipine, lisinopril.  Stable.  WPW history  CVA  Neurology signed off 10/11  Continue statins, Plavix, adequate blood pressure control.  Awaiting SNF for rehab.  As per discussion with clinical social work 11/19, difficult placement due to lack of insurance.  Normocytic anemia/B12 and iron deficiency, menorrhagia  Hemoglobin normal.  Anxiety and agoraphobia  Continue Zoloft.  Stable.  Hypokalemia and hypomagnesemia  Replaced.  Thrombocytopenia  Platelet counts  have dropped from 281 on 11/14to 49 on 11/18.  Repeated and normal.  Likely lab error.  Body mass index is 37.43 kg/m.  Nutritional Status Nutrition Problem: Inadequate oral intake Etiology: poor appetite Signs/Symptoms: per patient/family report Interventions: Prostat, Boost Breeze  DVT prophylaxis: Lovenox Code Status: DNR Family Communication: None at bedside Disposition: Awaiting SNF, difficult placement.   Consultants:  Neurology Psychiatry  Procedures:    Antimicrobials:  None   Subjective: No new complaints.  Objective:  Vitals:   12/17/18 1915 12/17/18 2324 12/18/18 0331 12/18/18 0916  BP: (!) 112/53 (!) 119/58 136/62 124/62  Pulse: 72 74 72   Resp: 17 16 17 18   Temp: 98.9 F (37.2 C) 98.7 F (37.1 C) 98.6 F (37 C) 99 F (37.2 C)  TempSrc: Oral Oral Oral Oral  SpO2: 98% 97% 96% 97%  Weight:      Height:        Examination:  General exam: Pleasant young female lying comfortably in bed. Respiratory system: Clear to auscultation.  No increased work of breathing. Cardiovascular system: S1 & S2 heard, RRR. No JVD, murmurs, rubs, gallops or clicks. No pedal edema. Gastrointestinal system: Abdomen is nondistended, soft and nontender. No organomegaly or masses felt. Normal bowel sounds heard. Central nervous system: Alert and oriented. No focal neurological deficits. Extremities: Symmetric 5 x 5 power. Skin: No rashes, lesions or ulcers Psychiatry: Judgement and insight appear impaired. Mood & affect mostly pleasant.    Data Reviewed: I have personally reviewed following labs and imaging studies   CBC: Recent Labs  Lab 12/12/18 0319 12/13/18 1039  WBC 6.3 9.0  NEUTROABS 4.0  --   HGB 13.2 12.8  HCT 38.3 37.6  MCV 89.7 90.4  PLT 49* [id number]    Basic Metabolic Panel: Recent Labs  Lab 12/12/18 0319  NA 137  K 4.6  CL 105  CO2 21*  GLUCOSE 130*  BUN 18  CREATININE 0.57  CALCIUM 9.3  MG 1.9  PHOS 4.5    Liver Function Tests:  Recent Labs  Lab 12/12/18 0319  ALBUMIN 3.1*    CBG: Recent Labs  Lab 12/17/18 1136 12/17/18 1619 12/17/18 2101 12/18/18 0612 12/18/18 1223  GLUCAP 248* 109* 163* 172* 185*    No results found for this or any previous visit (from the past 240 hour(s)).    Radiology Studies: No results found.        Scheduled Meds: . amLODipine  10 mg Oral Daily  . atorvastatin  80 mg Oral q1800  . carvedilol  6.25 mg Oral BID WC  . clopidogrel  75 mg Oral Daily  . enoxaparin (LOVENOX) injection  40 mg Subcutaneous Q24H  . feeding supplement  1 Container Oral BID BM  . feeding supplement (PRO-STAT SUGAR FREE 64)  30 mL Oral TID  . insulin aspart  0-15 Units Subcutaneous TID WC  . insulin aspart  0-5 Units Subcutaneous QHS  . insulin aspart  4 Units Subcutaneous TID WC  . insulin glargine  10 Units Subcutaneous QHS  . linagliptin  5 mg Oral Daily  . lisinopril  40 mg Oral Daily  . pantoprazole  40 mg Oral Daily  . sertraline  50 mg Oral Daily  . sodium chloride flush  10-40 mL Intracatheter Q12H  . thiamine  100 mg Oral Daily   Continuous Infusions:   LOS: 39 days     Becky Stankiewicz, MD, FACP, SFHM. [facility]  To contact the attending provider between 7A-7P or the covering provider during after hours 7P-7A, please log into the web site [email or web address] and access using universal [place] password for that web site. If you do not have the password, please call the hospital operator.  12/18/2018, 4:25 PM

## 2018-12-19 DIAGNOSIS — E0810 Diabetes mellitus due to underlying condition with ketoacidosis without coma: Secondary | ICD-10-CM

## 2018-12-19 LAB — GLUCOSE, CAPILLARY
Glucose-Capillary: 194 mg/dL — ABNORMAL HIGH (ref 70–99)
Glucose-Capillary: 279 mg/dL — ABNORMAL HIGH (ref 70–99)
Glucose-Capillary: 121 mg/dL — ABNORMAL HIGH (ref 70–99)
Glucose-Capillary: 190 mg/dL — ABNORMAL HIGH (ref 70–99)

## 2018-12-19 NOTE — Progress Notes (Signed)
Physical Therapy Treatment Patient Details Name: Becky Stout MRN: [id number] DOB: [birth year] Today's Date: 12/19/2018    History of Present Illness 63 y.o. female with Past medical history of B12 deficiency, type II DM not on therapy, HTN, IDA, obesity, WPW syndrome.Pt dx with acute CVA, MRI reveals multiple cerebral and brainstem lacunar infarcts, acute metabolic encepholopathy, Hypertensive emergency, DKA, agorophobia, N/V, hypokalemia, and FTT.      PT Comments    Pt performed minimal amounts of gt training due to fear to progress away from the bed due to anxiety.  After gt training she returned back to supine and guided through B LE exercises.  She continues to present with poor activity tolerance and will continue to require SNF placement at d/c.  Plan next session for continued attempt to progress gt if patient will comply.    Follow Up Recommendations  SNF     Equipment Recommendations  Wheelchair (measurements PT);Wheelchair cushion (measurements PT);3in1 (PT);Rolling walker with 5" wheels    Recommendations for Other Services       Precautions / Restrictions Precautions Precautions: Fall Precaution Comments: extremely anxious, doesn't want to be touched (if at all possible) and fearful of falling Restrictions Weight Bearing Restrictions: No    Mobility  Bed Mobility Overal bed mobility: Needs Assistance Bed Mobility: Supine to Sit;Sit to Supine Rolling: Supervision   Supine to sit: Supervision     General bed mobility comments: Pt able to move out of and into bed.  Pt required increased time and effort but no physical assistance.  Transfers Overall transfer level: Needs assistance Equipment used: Rolling walker (2 wheeled) Transfers: Stand Pivot Transfers Sit to Stand: Min assist Stand pivot transfers: Min assist       General transfer comment: Cues for hand placement to and from seated surface.  Pt required assistance to boost into standing from low  seated surface ( low bed ) she refused to have bed elevated to improve ease of transfer.  Poor eccentric loading back to seated surface.  Ambulation/Gait Ambulation/Gait assistance: Min assist Gait Distance (Feet): 6 Feet(3 steps away from bed and three steps backward back to bed.) Assistive device: Rolling walker (2 wheeled) Gait Pattern/deviations: Step-to pattern;Shuffle;Trunk flexed;Wide base of support     General Gait Details: Pt performed slow steps away from bed but as she started to feel anxiously she qquickly backed back to bed and returned to seated surface.   Stairs             Wheelchair Mobility    Modified Rankin (Stroke Patients Only) Modified Rankin (Stroke Patients Only) Pre-Morbid Rankin Score: Moderate disability Modified Rankin: Moderately severe disability     Balance Overall balance assessment: Needs assistance Sitting-balance support: Feet supported;Bilateral upper extremity supported Sitting balance-Leahy Scale: Fair       Standing balance-Leahy Scale: Poor Standing balance comment: reliant on BUE support on RW                            Cognition Arousal/Alertness: Awake/alert Behavior During Therapy: Anxious Overall Cognitive Status: No family/caregiver present to determine baseline cognitive functioning                                 General Comments: Pt continues to self limit due to poor safety awareness and cognition.  She remains extremely limited due to fear of falling.  Exercises General Exercises - Lower Extremity Heel Slides: AROM;Both;10 reps;Supine Hip ABduction/ADduction: AROM;Both;10 reps;Supine Straight Leg Raises: AAROM;Both;10 reps;Supine    General Comments        Pertinent Vitals/Pain Pain Assessment: No/denies pain Faces Pain Scale: Hurts [place] more Pain Location: generalized discomfort. Pain Descriptors / Indicators: Burning Pain Intervention(s): Monitored during  session;Repositioned    Home Living                      Prior Function            PT Goals (current goals can now be found in the care plan section) Acute Rehab PT Goals Patient Stated Goal: to sleep Potential to Achieve Goals: Fair Progress towards PT goals: Progressing toward goals    Frequency    Min 3X/week      PT Plan Current plan remains appropriate    Co-evaluation              AM-PAC PT "6 Clicks" Mobility   Outcome Measure  Help needed turning from your back to your side while in a flat bed without using bedrails?: A Alberts Help needed moving from lying on your back to sitting on the side of a flat bed without using bedrails?: A Winsor Help needed moving to and from a bed to a chair (including a wheelchair)?: A Magana Help needed standing up from a chair using your arms (e.g., wheelchair or bedside chair)?: A Dante Help needed to walk in hospital room?: A Ratledge Help needed climbing 3-5 steps with a railing? : A Lot 6 Click Score: 17    End of Session Equipment Utilized During Treatment: Gait belt Activity Tolerance: Patient limited by fatigue Patient left: with call bell/phone within reach;in chair;with chair alarm set Nurse Communication: Mobility status PT Visit Diagnosis: Other abnormalities of gait and mobility (R26.89)     Time: [id number] PT Time Calculation (min) (ACUTE ONLY): 22 min  Charges:  $Gait Training: 8-22 mins                     Erasmo Leventhal , PTA Acute Rehabilitation Services Pager [phone] Office [phone]     Ines Rebel [name] 12/19/2018, 3:46 PM

## 2018-12-19 NOTE — TOC Progression Note (Signed)
Transition of Care [facility]) - Progression Note    Patient Details  Name: Becky Stout MRN: [id number] Date of Birth: 1955/06/10  Transition of Care [facility]) CM/SW [place], [place] Phone Number: 12/19/2018, 4:04 PM  Clinical Narrative:    No SNF bed offers exist. Patient continues to be on Difficult to Place list.    Expected Discharge Plan: [place] Barriers to Discharge: Continued Medical Work up, Inadequate or no insurance  Expected Discharge Plan and Services Expected Discharge Plan: [place] In-house Referral: Clinical Social Work Discharge Planning Services: CM Consult Post Acute Care Choice: [place] arrangements for the past 2 months: Single Family Home                                       Social Determinants of Health (SDOH) Interventions    Readmission Risk Interventions No flowsheet data found.

## 2018-12-19 NOTE — Progress Notes (Signed)
PROGRESS NOTE   Becky Stout  [id number]    DOB: 25-Jan-1955    DOA: 11/03/2018  PCP: Abner Greenspan, MD   I have briefly reviewed patients previous medical records in [facility].  Chief Complaint  Patient presents with  . Nausea  . Emesis  . Altered Mental Status    Brief Narrative:  63 year old female with PMH of DM 2, HTN, WPW syndrome, frequent falls, confusion, and weight loss was admitted for DKA, acute CVA and metabolic encephalopathy.  Prolonged hospital stay due to difficulty in SNF placement.  No new or acute issues ongoing per patient or nursing for several days now.  Remains hospitalized due to lack of safe discharge plan as noted above.   Assessment & Plan:   Principal Problem:   Failure to thrive in adult Active Problems:   Hypothyroidism   B12 deficiency   WOLFF (WOLFE)-PARKINSON-WHITE (WPW) SYNDROME   Essential hypertension   Hyperlipidemia   Poorly controlled type 2 diabetes mellitus (HCC)   Anxiety   Acute lower UTI   High anion gap metabolic acidosis   DKA (diabetic ketoacidoses) (HCC)   Cerebral thrombosis with cerebral infarction   DKA in type II DM, not at goal.  DKA resolved.  A1c 7.9.  CBGs currently reasonably controlled with mild intermittent increases on current regimen of Lantus 10 units at bedtime, NovoLog 4 units 3 times daily with meals, SSI and Tradjenta.  Continue current regimen.  Stable.  Essential hypertension Controlled on carvedilol, amlodipine, lisinopril.  Stable.  WPW history  CVA  Neurology signed off 10/11  Continue statins, Plavix, adequate blood pressure control.  Awaiting SNF for rehab.  As per discussion with clinical social work 11/19, difficult placement due to lack of insurance.  Normocytic anemia/B12 and iron deficiency, menorrhagia  Hemoglobin normal.  Anxiety and agoraphobia  Continue Zoloft.  Stable.  Hypokalemia and hypomagnesemia  Replaced.  Thrombocytopenia  Platelet counts  have dropped from 281 on 11/14to 49 on 11/18.  Repeated and normal.  Likely lab error.  Body mass index is 37.39 kg/m.  Nutritional Status Nutrition Problem: Inadequate oral intake Etiology: poor appetite Signs/Symptoms: per patient/family report Interventions: Prostat, Boost Breeze  DVT prophylaxis: Lovenox Code Status: DNR Family Communication: None at bedside Disposition: Awaiting SNF, difficult placement.   Consultants:  Neurology Psychiatry  Procedures:    Antimicrobials:  None   Subjective: Denies having any complaints.  Patient is extremely tangential.  Per nursing staff patient is tolerating the diet well. Objective:  Vitals:   12/19/18 0000 12/19/18 0400 12/19/18 0459 12/19/18 0858  BP: 132/70 136/74  132/60  Pulse: 70 72  72  Resp: 18 18  16   Temp: 98.6 F (37 C) 98.5 F (36.9 C)  98.5 F (36.9 C)  TempSrc: Oral Oral  Oral  SpO2: 99% 100%  98%  Weight:   98.8 kg   Height:        Examination:  General exam: Pleasant young female lying comfortably in bed. Respiratory system: Clear to auscultation.  No increased work of breathing. Cardiovascular system: S1 & S2 heard, RRR. No JVD, murmurs, rubs, gallops or clicks. No pedal edema. Gastrointestinal system: Abdomen is nondistended, soft and nontender. No organomegaly or masses felt. Normal bowel sounds heard. Central nervous system: Alert and oriented. No focal neurological deficits. Extremities: Symmetric 5 x 5 power. Skin: No rashes, lesions or ulcers Psychiatry: Judgement and insight appear impaired. Mood & affect mostly pleasant.    Data Reviewed: I have personally reviewed  following labs and imaging studies   CBC: Recent Labs  Lab 12/13/18 1039  WBC 9.0  HGB 12.8  HCT 37.6  MCV 90.4  PLT [id number]    Basic Metabolic Panel: No results for input(s): NA, K, CL, CO2, GLUCOSE, BUN, CREATININE, CALCIUM, MG, PHOS in the last 168 hours.  Liver Function Tests: No results for input(s): AST,  ALT, ALKPHOS, BILITOT, PROT, ALBUMIN in the last 168 hours.  CBG: Recent Labs  Lab 12/18/18 0612 12/18/18 1223 12/18/18 1645 12/18/18 2119 12/19/18 0605  GLUCAP 172* 185* 143* 223* 194*    No results found for this or any previous visit (from the past 240 hour(s)).    Radiology Studies: No results found.        Scheduled Meds: . amLODipine  10 mg Oral Daily  . atorvastatin  80 mg Oral q1800  . carvedilol  6.25 mg Oral BID WC  . clopidogrel  75 mg Oral Daily  . enoxaparin (LOVENOX) injection  40 mg Subcutaneous Q24H  . feeding supplement  1 Container Oral BID BM  . feeding supplement (PRO-STAT SUGAR FREE 64)  30 mL Oral TID  . insulin aspart  0-15 Units Subcutaneous TID WC  . insulin aspart  0-5 Units Subcutaneous QHS  . insulin aspart  4 Units Subcutaneous TID WC  . insulin glargine  10 Units Subcutaneous QHS  . linagliptin  5 mg Oral Daily  . lisinopril  40 mg Oral Daily  . pantoprazole  40 mg Oral Daily  . sertraline  50 mg Oral Daily  . sodium chloride flush  10-40 mL Intracatheter Q12H  . thiamine  100 mg Oral Daily   Continuous Infusions:   LOS: 56 days     Emanie Behan, MD, FACP, SFHM. [facility]  To contact the attending provider between 7A-7P or the covering provider during after hours 7P-7A, please log into the web site [email or web address] and access using universal [place] password for that web site. If you do not have the password, please call the hospital operator.  12/19/2018, 9:15 AM

## 2018-12-20 LAB — GLUCOSE, CAPILLARY
Glucose-Capillary: 227 mg/dL — ABNORMAL HIGH (ref 70–99)
Glucose-Capillary: 197 mg/dL — ABNORMAL HIGH (ref 70–99)
Glucose-Capillary: 124 mg/dL — ABNORMAL HIGH (ref 70–99)
Glucose-Capillary: 170 mg/dL — ABNORMAL HIGH (ref 70–99)

## 2018-12-20 NOTE — Progress Notes (Signed)
PROGRESS NOTE   Becky Stout  [id number]    DOB: [birth year]    DOA: 11/03/2018  PCP: Becky Greenspan, MD   I have briefly reviewed patients previous medical records in [facility].  Chief Complaint  Patient presents with  . Nausea  . Emesis  . Altered Mental Status    Brief Narrative:  63 year old female with PMH of DM 2, HTN, WPW syndrome, frequent falls, confusion, and weight loss was admitted for DKA, acute CVA and metabolic encephalopathy.  Prolonged hospital stay due to difficulty in SNF placement.  No new or acute issues ongoing per patient or nursing for several days now.  Remains hospitalized due to lack of safe discharge plan as noted above.   Assessment & Plan:   Principal Problem:   Failure to thrive in adult Active Problems:   Hypothyroidism   B12 deficiency   WOLFF (WOLFE)-PARKINSON-WHITE (WPW) SYNDROME   Essential hypertension   Hyperlipidemia   Poorly controlled type 2 diabetes mellitus (HCC)   Anxiety   Acute lower UTI   High anion gap metabolic acidosis   DKA (diabetic ketoacidoses) (HCC)   Cerebral thrombosis with cerebral infarction   DKA in type II DM, not at goal.  DKA resolved.  A1c 7.9.  CBGs currently reasonably controlled with mild intermittent increases on current regimen of Lantus 10 units at bedtime, NovoLog 4 units 3 times daily with meals, SSI and Tradjenta.  Continue current regimen.  Stable.  Essential hypertension Controlled on carvedilol, amlodipine, lisinopril.  Stable.  WPW history  CVA  Neurology signed off 10/11  Continue statins, Plavix, adequate blood pressure control.  Awaiting SNF for rehab.  As per discussion with clinical social work 11/19, difficult placement due to lack of insurance.  Normocytic anemia/B12 and iron deficiency, menorrhagia  Hemoglobin normal.  Anxiety and agoraphobia  Continue Zoloft.  Stable.  Hypokalemia and hypomagnesemia  Replaced.  Thrombocytopenia  Platelet counts  have dropped from 281 on 11/14to 49 on 11/18.  Repeated and normal.  Likely lab error.  Body mass index is 37.39 kg/m.  Nutritional Status Nutrition Problem: Inadequate oral intake Etiology: poor appetite Signs/Symptoms: per patient/family report Interventions: Prostat, Boost Breeze  DVT prophylaxis: Lovenox Code Status: DNR Family Communication: Patient's sister who is at bedside Disposition: Awaiting SNF, difficult placement.   Consultants:  Neurology Psychiatry  Procedures:    Antimicrobials:  None   Subjective: Denies having any complaints.  Patient is extremely tangential.  Per nursing staff patient is tolerating the diet well.  Patient's sister is at bedside. Objective:  Vitals:   12/19/18 2312 12/20/18 0500 12/20/18 0735 12/20/18 1130  BP: (!) 125/58 (!) 144/67 132/63 (!) 121/55  Pulse: 72 79 76 69  Resp: 18 18 16 16   Temp: 98.2 F (36.8 C) 98.6 F (37 C) 98.1 F (36.7 C) 97.9 F (36.6 C)  TempSrc: Oral Oral Oral Oral  SpO2: 96% 96% 99% 98%  Weight:      Height:        Examination:  General exam: Pleasant young female lying comfortably in bed. Respiratory system: Clear to auscultation.  No increased work of breathing. Cardiovascular system: S1 & S2 heard, RRR. No JVD, murmurs, rubs, gallops or clicks. No pedal edema. Gastrointestinal system: Abdomen is nondistended, soft and nontender. No organomegaly or masses felt. Normal bowel sounds heard. Central nervous system: Alert and oriented. No focal neurological deficits. Extremities: Symmetric 5 x 5 power. Skin: No rashes, lesions or ulcers Psychiatry: Judgement and insight appear impaired.  Mood & affect mostly pleasant.    Data Reviewed: I have personally reviewed following labs and imaging studies   CBC: No results for input(s): WBC, NEUTROABS, HGB, HCT, MCV, PLT in the last 168 hours.  Basic Metabolic Panel: No results for input(s): NA, K, CL, CO2, GLUCOSE, BUN, CREATININE, CALCIUM, MG,  PHOS in the last 168 hours.  Liver Function Tests: No results for input(s): AST, ALT, ALKPHOS, BILITOT, PROT, ALBUMIN in the last 168 hours.  CBG: Recent Labs  Lab 12/19/18 1058 12/19/18 1610 12/19/18 2124 12/20/18 0607 12/20/18 1219  GLUCAP 279* 121* 190* 227* 197*    No results found for this or any previous visit (from the past 240 hour(s)).    Radiology Studies: No results found.        Scheduled Meds: . amLODipine  10 mg Oral Daily  . atorvastatin  80 mg Oral q1800  . carvedilol  6.25 mg Oral BID WC  . clopidogrel  75 mg Oral Daily  . enoxaparin (LOVENOX) injection  40 mg Subcutaneous Q24H  . feeding supplement  1 Container Oral BID BM  . feeding supplement (PRO-STAT SUGAR FREE 64)  30 mL Oral TID  . insulin aspart  0-15 Units Subcutaneous TID WC  . insulin aspart  0-5 Units Subcutaneous QHS  . insulin aspart  4 Units Subcutaneous TID WC  . insulin glargine  10 Units Subcutaneous QHS  . linagliptin  5 mg Oral Daily  . lisinopril  40 mg Oral Daily  . pantoprazole  40 mg Oral Daily  . sertraline  50 mg Oral Daily  . sodium chloride flush  10-40 mL Intracatheter Q12H  . thiamine  100 mg Oral Daily   Continuous Infusions:   LOS: 35 days     Ashely Goosby, MD, FACP, SFHM. [facility]  To contact the attending provider between 7A-7P or the covering provider during after hours 7P-7A, please log into the web site [email or web address] and access using universal [place] password for that web site. If you do not have the password, please call the hospital operator.  12/20/2018, 2:38 PM

## 2018-12-21 LAB — GLUCOSE, CAPILLARY
Glucose-Capillary: 197 mg/dL — ABNORMAL HIGH (ref 70–99)
Glucose-Capillary: 177 mg/dL — ABNORMAL HIGH (ref 70–99)
Glucose-Capillary: 199 mg/dL — ABNORMAL HIGH (ref 70–99)
Glucose-Capillary: 191 mg/dL — ABNORMAL HIGH (ref 70–99)

## 2018-12-21 NOTE — Progress Notes (Signed)
Occupational Therapy Treatment Patient Details Name: Becky Stout MRN: [id number] DOB: Mar 28, 1955 Today's Date: 12/21/2018    History of present illness 63 y.o. female with Past medical history of B12 deficiency, type II DM not on therapy, HTN, IDA, obesity, WPW syndrome.Pt dx with acute CVA, MRI reveals multiple cerebral and brainstem lacunar infarcts, acute metabolic encepholopathy, Hypertensive emergency, DKA, agorophobia, N/V, hypokalemia, and FTT.     OT comments  Pt found to be have just thrown up and sitting in BM. Pt with no awareness that she was dirty not wanting to sit>stand to get cleaned. Pt stood initially after MAX encouragement for ~ 15 seconds before sitting back down abruptly in BM. Pt perseverating on wanting sheets on the bed despite MAX education that pt needed to be cleaned up before getting in bed. Pt able to sit>stand once more and stand pivot to EOB with RW with MIN A +2 for safety.  Cued pt to assist with LB bathing once in bed with pt noted to be wiping face/ eyes with dirty hand. Overall pt required total - MAX A for pericare once in bed. Pt benefits from all safety measures being taken as pt very anxious and is very specific about her care. DC plan remains appropriate, will continue to follow acutely per POC.   Follow Up Recommendations  SNF    Equipment Recommendations  Other (comment)(tbd)    Recommendations for Other Services      Precautions / Restrictions Precautions Precautions: Fall Precaution Comments: extremely anxious, doesn't want to be touched (if at all possible) and fearful of falling Restrictions Weight Bearing Restrictions: No       Mobility Bed Mobility Overal bed mobility: Needs Assistance Bed Mobility: Sit to Supine Rolling: Supervision   Supine to sit: Supervision Sit to supine: Supervision   General bed mobility comments: Pt able to move into bed.  Pt required increased time and effort but no physical  assistance.  Transfers Overall transfer level: Needs assistance Equipment used: Rolling walker (2 wheeled) Transfers: Sit to/from Stand Sit to Stand: Min assist;+2 safety/equipment Stand pivot transfers: Min assist;+2 physical assistance       General transfer comment: MINA +2 for safety; assist to steady pt initially while bring BUEs to RW    Balance Overall balance assessment: Needs assistance Sitting-balance support: Feet supported;Bilateral upper extremity supported Sitting balance-Leahy Scale: Fair Sitting balance - Comments: BUE support due to fear of falling   Standing balance support: Bilateral upper extremity supported Standing balance-Leahy Scale: Poor Standing balance comment: reliant on BUE support on RW                           ADL either performed or assessed with clinical judgement   ADL Overall ADL's : Needs assistance/impaired             Lower Body Bathing: Maximal assistance;Sit to/from stand;Total assistance Lower Body Bathing Details (indicate cue type and reason): pt initially able to wash LB after BM in chair but began to wipe BM in her eyes. Required total A for cleanliness         Toilet Transfer: Minimal assistance;Stand-pivot;RW;+2 for safety/equipment;Min guard Toilet Transfer Details (indicate cue type and reason): simulated back to bed from recliner with RW; required MAX enocuragement as pt perseverating on wanting to get underwear on first even though pt dirty from [place] and Hygiene: Total assistance;Bed level Toileting - Clothing Manipulation Details (indicate cue type and  reason): rolled R<>L for posterior pericare; became aggressive when trying to clean in between pts legs, trying to hit other staff.     Functional mobility during ADLs: Minimal assistance;Rolling walker;+2 for safety/equipment General ADL Comments: pt found to have thrown up on self and had BM in chair upon arrival. Pt not  wanting to sit>stand to get cleaned up, perseverating on wanting sheet on bed. No awareness that pt is visible soiled. MIN A +2 for safety to stand pivot back to bed and complete pericare in bed via rolling     Vision Baseline Vision/History: Wears glasses Wears Glasses: At all times Patient Visual Report: No change from baseline     Perception     Praxis      Cognition Arousal/Alertness: Awake/alert Behavior During Therapy: Anxious Overall Cognitive Status: No family/caregiver present to determine baseline cognitive functioning Area of Impairment: Following commands;Safety/judgement;Awareness;Problem solving                 Orientation Level: Situation;Disoriented to Current Attention Level: Sustained Memory: Decreased recall of precautions;Decreased short-term memory Following Commands: Follows one step commands with increased time;Follows one step commands inconsistently Safety/Judgement: Decreased awareness of safety;Decreased awareness of deficits Awareness: Emergent Problem Solving: Slow processing;Decreased initiation;Difficulty sequencing;Requires verbal cues;Requires tactile cues General Comments: Pt continues to self limit due to poor safety awareness and cognition.  She remains extremely limited due to fear of falling. No awareness to situation or safety concerns        Exercises     Shoulder Instructions       General Comments      Pertinent Vitals/ Pain       Pain Assessment: No/denies pain Faces Pain Scale: Hurts [place] more Pain Location: generalized discomfort. Pain Descriptors / Indicators: Burning Pain Intervention(s): Monitored during session  Home Living                                          Prior Functioning/Environment              Frequency  Min 2X/week        Progress Toward Goals  OT Goals(current goals can now be found in the care plan section)  Progress towards OT goals: Progressing toward  goals  Acute Rehab OT Goals Patient Stated Goal: to sleep OT Goal Formulation: With patient Time For Goal Achievement: 12/25/18 Potential to Achieve Goals: Good  Plan Discharge plan remains appropriate    Co-evaluation                 AM-PAC OT "6 Clicks" Daily Activity     Outcome Measure   Help from another person eating meals?: A Deetz Help from another person taking care of personal grooming?: A Diffee Help from another person toileting, which includes using toliet, bedpan, or urinal?: A Jahnke Help from another person bathing (including washing, rinsing, drying)?: A Rijo Help from another person to put on and taking off regular upper body clothing?: A Liebler Help from another person to put on and taking off regular lower body clothing?: A Lot 6 Click Score: 17    End of Session Equipment Utilized During Treatment: Rolling walker  OT Visit Diagnosis: Unsteadiness on feet (R26.81);Other abnormalities of gait and mobility (R26.89);Muscle weakness (generalized) (M62.81);History of falling (Z91.81);Other symptoms and signs involving cognitive function;Adult, failure to thrive (R62.7)   Activity Tolerance Treatment limited secondary to agitation  Patient Left in bed;with call bell/phone within reach;with nursing/sitter in room   Nurse Communication Mobility status        Time: 1100-1133 OT Time Calculation (min): 33 min  Charges: OT General Charges $OT Visit: 1 Visit OT Treatments $Self Care/Home Management : 23-37 mins  Lanier Clam., COTA/L Acute Rehabilitation Services [id number]   Ihor Gully 12/21/2018, 12:50 PM

## 2018-12-21 NOTE — Progress Notes (Signed)
PROGRESS NOTE   Becky Stout  [id number]    DOB: 1955/12/07    DOA: 11/03/2018  PCP: Abner Greenspan, MD   I have briefly reviewed patients previous medical records in [facility].  Chief Complaint  Patient presents with  . Nausea  . Emesis  . Altered Mental Status    Brief Narrative:  63 year old female with PMH of DM 2, HTN, WPW syndrome, frequent falls, confusion, and weight loss was admitted for DKA, acute CVA and metabolic encephalopathy.  Prolonged hospital stay due to difficulty in SNF placement.  No new or acute issues ongoing per patient or nursing for several days now.  Remains hospitalized due to lack of safe discharge plan as noted above.   Assessment & Plan:   Principal Problem:   Failure to thrive in adult Active Problems:   Hypothyroidism   B12 deficiency   WOLFF (WOLFE)-PARKINSON-WHITE (WPW) SYNDROME   Essential hypertension   Hyperlipidemia   Poorly controlled type 2 diabetes mellitus (HCC)   Anxiety   Acute lower UTI   High anion gap metabolic acidosis   DKA (diabetic ketoacidoses) (HCC)   Cerebral thrombosis with cerebral infarction   DKA in type II DM, not at goal.  DKA resolved.  A1c 7.9.  CBGs currently reasonably controlled with mild intermittent increases on current regimen of Lantus 10 units at bedtime, NovoLog 4 units 3 times daily with meals, SSI and Tradjenta.  Continue current regimen.  Stable.  Essential hypertension Controlled on carvedilol, amlodipine, lisinopril.  Stable.  WPW history  CVA  Neurology signed off 10/11  Continue statins, Plavix, adequate blood pressure control.  Awaiting SNF for rehab.  As per discussion with clinical social work 11/19, difficult placement due to lack of insurance.  Normocytic anemia/B12 and iron deficiency, menorrhagia  Hemoglobin normal.  Anxiety and agoraphobia  Continue Zoloft.  Stable.  Hypokalemia and hypomagnesemia  Replaced.  Thrombocytopenia  Platelet counts  have dropped from 281 on 11/14to 49 on 11/18.  Repeated and normal.  Likely lab error.  Body mass index is 37.84 kg/m.  Nutritional Status Nutrition Problem: Inadequate oral intake Etiology: poor appetite Signs/Symptoms: per patient/family report Interventions: Prostat, Boost Breeze  DVT prophylaxis: Lovenox Code Status: DNR Family Communication: Patient's sister who is at bedside Disposition: Awaiting SNF, difficult placement.   Consultants:  Neurology Psychiatry  Procedures:    Antimicrobials:  None   Subjective: Denies having any complaints.  Patient is extremely tangential.  Per nursing staff patient is tolerating the diet well.  Patient's sister is at bedside. Objective:  Vitals:   12/20/18 2323 12/21/18 0144 12/21/18 0347 12/21/18 0848  BP: (!) 125/56  126/66 127/72  Pulse: 72  79 73  Resp: 18  18 15   Temp: 98.1 F (36.7 C)  98.2 F (36.8 C) 97.7 F (36.5 C)  TempSrc: Oral  Oral Oral  SpO2: 100%  95% 99%  Weight:  100 kg    Height:        Examination:  General exam: Pleasant young female lying comfortably in bed. Respiratory system: Clear to auscultation.  No increased work of breathing. Cardiovascular system: S1 & S2 heard, RRR. No JVD, murmurs, rubs, gallops or clicks. No pedal edema. Gastrointestinal system: Abdomen is nondistended, soft and nontender. No organomegaly or masses felt. Normal bowel sounds heard. Central nervous system: Alert and oriented. No focal neurological deficits. Extremities: Symmetric 5 x 5 power. Skin: No rashes, lesions or ulcers Psychiatry: Judgement and insight appear impaired. Mood & affect mostly  pleasant.    Data Reviewed: I have personally reviewed following labs and imaging studies   CBC: No results for input(s): WBC, NEUTROABS, HGB, HCT, MCV, PLT in the last 168 hours.  Basic Metabolic Panel: No results for input(s): NA, K, CL, CO2, GLUCOSE, BUN, CREATININE, CALCIUM, MG, PHOS in the last 168 hours.  Liver  Function Tests: No results for input(s): AST, ALT, ALKPHOS, BILITOT, PROT, ALBUMIN in the last 168 hours.  CBG: Recent Labs  Lab 12/20/18 0607 12/20/18 1219 12/20/18 1630 12/20/18 2128 12/21/18 0612  GLUCAP 227* 197* 124* 170* 197*    No results found for this or any previous visit (from the past 240 hour(s)).    Radiology Studies: No results found.        Scheduled Meds: . amLODipine  10 mg Oral Daily  . atorvastatin  80 mg Oral q1800  . carvedilol  6.25 mg Oral BID WC  . clopidogrel  75 mg Oral Daily  . enoxaparin (LOVENOX) injection  40 mg Subcutaneous Q24H  . feeding supplement  1 Container Oral BID BM  . feeding supplement (PRO-STAT SUGAR FREE 64)  30 mL Oral TID  . insulin aspart  0-15 Units Subcutaneous TID WC  . insulin aspart  0-5 Units Subcutaneous QHS  . insulin aspart  4 Units Subcutaneous TID WC  . insulin glargine  10 Units Subcutaneous QHS  . linagliptin  5 mg Oral Daily  . lisinopril  40 mg Oral Daily  . pantoprazole  40 mg Oral Daily  . sertraline  50 mg Oral Daily  . sodium chloride flush  10-40 mL Intracatheter Q12H  . thiamine  100 mg Oral Daily   Continuous Infusions:   LOS: 54 days     Jakaden Ouzts, MD, FACP, SFHM. [facility]  To contact the attending provider between 7A-7P or the covering provider during after hours 7P-7A, please log into the web site [email or web address] and access using universal [place] password for that web site. If you do not have the password, please call the hospital operator.  12/21/2018, 10:00 AM

## 2018-12-21 NOTE — Progress Notes (Signed)
Patient vomited brown emesis, reports no nausea.

## 2018-12-21 NOTE — Progress Notes (Signed)
Physical Therapy Treatment Patient Details Name: Becky Stout MRN: [id number] DOB: Jun 04, 1955 Today's Date: 12/21/2018    History of Present Illness 63 y.o. female with Past medical history of B12 deficiency, type II DM not on therapy, HTN, IDA, obesity, WPW syndrome.Pt dx with acute CVA, MRI reveals multiple cerebral and brainstem lacunar infarcts, acute metabolic encepholopathy, Hypertensive emergency, DKA, agorophobia, N/V, hypokalemia, and FTT.      PT Comments    Pt performed transfer training multiple reps due to bowel incontinence.  She fatigues very quickly and required max VCs to achieve progression of mobility.  Pt ultimately transferred from bed to chair to allow for linen change.  She continues to require SNF placement at d/c based cognitive deficits and physical impairments/deconditioning.     Follow Up Recommendations  SNF     Equipment Recommendations  Wheelchair (measurements PT);Wheelchair cushion (measurements PT);3in1 (PT);Rolling walker with 5" wheels    Recommendations for Other Services       Precautions / Restrictions Precautions Precautions: Fall Precaution Comments: extremely anxious, doesn't want to be touched (if at all possible) and fearful of falling Restrictions Weight Bearing Restrictions: No    Mobility  Bed Mobility Overal bed mobility: Needs Assistance Bed Mobility: Supine to Sit;Sit to Supine Rolling: Supervision   Supine to sit: Supervision Sit to supine: Supervision   General bed mobility comments: Pt able to move out of and into bed.  Pt required increased time and effort but no physical assistance.  Transfers Overall transfer level: Needs assistance Equipment used: Rolling walker (2 wheeled) Transfers: Sit to/from Stand Sit to Stand: Mod assist         General transfer comment: From low seated height of low bed, she refused to allow for elevation of the bed height to improve ease of  standing.  Ambulation/Gait Ambulation/Gait assistance: Min assist Gait Distance (Feet): 4 Feet(steps from bed to recliner chair.) Assistive device: Rolling walker (2 wheeled) Gait Pattern/deviations: Step-to pattern;Shuffle;Trunk flexed;Wide base of support     General Gait Details: Pt stood x4 times.  During first stance noted with heavy bowel incontinence but would not remains standing to allow for pericare.  Pt stood again for pericare and sat prematurely back into a soiled bed pad.  Stood again and she followed commands to stay standing and allow for paricare.  After third time standing she returned to supine and required max encouragement to move out of bed to chair to allow for bed linen change.  Pt then stood for transfer from bed to chair.  Pt refused to put L hand on RW and reached for armrests on the chair to turn and sit.   Stairs             Wheelchair Mobility    Modified Rankin (Stroke Patients Only)       Balance Overall balance assessment: Needs assistance Sitting-balance support: Feet supported;Bilateral upper extremity supported Sitting balance-Leahy Scale: Fair Sitting balance - Comments: BUE support due to fear of falling     Standing balance-Leahy Scale: Poor Standing balance comment: reliant on BUE support on RW                            Cognition Arousal/Alertness: Awake/alert Behavior During Therapy: Anxious Overall Cognitive Status: No family/caregiver present to determine baseline cognitive functioning Area of Impairment: Following commands;Safety/judgement;Awareness;Problem solving                 Orientation Level: Situation;Disoriented  to Current Attention Level: Sustained Memory: Decreased recall of precautions;Decreased short-term memory Following Commands: Follows one step commands with increased time;Follows one step commands inconsistently Safety/Judgement: Decreased awareness of safety;Decreased awareness of  deficits Awareness: Emergent Problem Solving: Slow processing;Decreased initiation;Difficulty sequencing;Requires verbal cues;Requires tactile cues General Comments: Pt continues to self limit due to poor safety awareness and cognition.  She remains extremely limited due to fear of falling.      Exercises      General Comments        Pertinent Vitals/Pain Pain Assessment: No/denies pain Faces Pain Scale: Hurts [place] more Pain Location: generalized discomfort. Pain Descriptors / Indicators: Burning Pain Intervention(s): Monitored during session    Home Living                      Prior Function            PT Goals (current goals can now be found in the care plan section) Acute Rehab PT Goals Patient Stated Goal: to sleep Potential to Achieve Goals: Fair Progress towards PT goals: Progressing toward goals    Frequency    Min 3X/week      PT Plan Current plan remains appropriate    Co-evaluation              AM-PAC PT "6 Clicks" Mobility   Outcome Measure  Help needed turning from your back to your side while in a flat bed without using bedrails?: A Boehm Help needed moving from lying on your back to sitting on the side of a flat bed without using bedrails?: A [place] Help needed moving to and from a bed to a chair (including a wheelchair)?: A Bonneau Help needed standing up from a chair using your arms (e.g., wheelchair or bedside chair)?: A Drotar Help needed to walk in hospital room?: A Baranowski Help needed climbing 3-5 steps with a railing? : A Lot 6 Click Score: 17    End of Session Equipment Utilized During Treatment: Gait belt Activity Tolerance: Patient limited by fatigue Patient left: with call bell/phone within reach;in chair;with chair alarm set Nurse Communication: Mobility status PT Visit Diagnosis: Other abnormalities of gait and mobility (R26.89)     Time: 1024-1050 PT Time Calculation (min) (ACUTE ONLY): 26 min  Charges:   $Gait Training: 8-22 mins $Therapeutic Activity: 8-22 mins                     Erasmo Leventhal , PTA Acute Rehabilitation Services Pager [phone] Office [phone]     Skyeler Scalese [name] 12/21/2018, 11:11 AM

## 2018-12-21 NOTE — TOC Progression Note (Signed)
Transition of Care [facility]) - Progression Note    Patient Details  Name: Becky Stout MRN: [id number] Date of Birth: [birth year]  Transition of Care [facility]) CM/SW [place], [place] Phone Number: 12/21/2018, 5:09 PM  Clinical Narrative:    No SNF bed offers exist. Patient continues to be on Difficult to Place list.   Expected Discharge Plan: [place] Barriers to Discharge: Continued Medical Work up, Inadequate or no insurance  Expected Discharge Plan and Services Expected Discharge Plan: [place] In-house Referral: Clinical Social Work Discharge Planning Services: CM Consult Post Acute Care Choice: [place] arrangements for the past 2 months: Single Family Home                                       Social Determinants of Health (SDOH) Interventions    Readmission Risk Interventions No flowsheet data found.

## 2018-12-22 LAB — GLUCOSE, CAPILLARY
Glucose-Capillary: 189 mg/dL — ABNORMAL HIGH (ref 70–99)
Glucose-Capillary: 215 mg/dL — ABNORMAL HIGH (ref 70–99)
Glucose-Capillary: 144 mg/dL — ABNORMAL HIGH (ref 70–99)
Glucose-Capillary: 164 mg/dL — ABNORMAL HIGH (ref 70–99)

## 2018-12-22 NOTE — Progress Notes (Signed)
PROGRESS NOTE  Becky Stout [id number] DOB: 11-12-1955 DOA: 11/03/2018 PCP: Abner Greenspan, MD  HPI/Recap of past 71 hours: 64 year old female with PMH of DM 2, HTN, WPW syndrome, frequent falls, confusion, and weight loss was admitted for DKA, acute CVA and metabolic encephalopathy.  Prolonged hospital stay due to difficulty in SNF placement.  No new or acute issues ongoing per patient or nursing for several days now.  Remains hospitalized due to lack of safe discharge plan as noted above.  12/22/18: Patient was seen and examined at her bedside this morning no acute events overnight.  She has no new complaints.  Still weak.  PT OT assessed and recommended SNF.  CSW assisting with SNF placement.  Assessment/Plan: Principal Problem:   Failure to thrive in adult Active Problems:   Hypothyroidism   B12 deficiency   WOLFF (WOLFE)-PARKINSON-WHITE (WPW) SYNDROME   Essential hypertension   Hyperlipidemia   Poorly controlled type 2 diabetes mellitus (HCC)   Anxiety   Acute lower UTI   High anion gap metabolic acidosis   DKA (diabetic ketoacidoses) (HCC)   Cerebral thrombosis with cerebral infarction   DKA in type II DM, not at goal.  DKA resolved.  A1c 7.9.  CBGs currently reasonably controlled with mild intermittent increases on current regimen of Lantus 10 units at bedtime, NovoLog 4 units 3 times daily with meals, SSI and Tradjenta.  Continue current regimen.  Stable.  Essential hypertension Controlled on carvedilol, amlodipine, lisinopril.  Stable.  WPW history  CVA  Neurology signed off 10/11  Continue statins, Plavix, adequate blood pressure control.  Awaiting SNF for rehab.  As per discussion with clinical social work 11/19, difficult placement due to lack of insurance.  Normocytic anemia/B12 and iron deficiency, menorrhagia  Hemoglobin normal.  Anxiety and agoraphobia  Continue Zoloft.  Stable.  Hypokalemia and hypomagnesemia  Replaced.   Thrombocytopenia  Platelet counts have dropped from 281 on 11/14to 49 on 11/18.  Repeated and normal.  Likely lab error.  Body mass index is 37.84 kg/m.  Nutritional Status Nutrition Problem: Inadequate oral intake Etiology: poor appetite Signs/Symptoms: per patient/family report Interventions: Prostat, Boost Breeze  DVT prophylaxis: Lovenox Code Status: DNR Family Communication: Patient's sister who is at bedside Disposition: Awaiting SNF, difficult placement.   Consultants:  Neurology Psychiatry  Procedures:    Antimicrobials:  None     Objective: Vitals:   12/22/18 0253 12/22/18 0339 12/22/18 0756 12/22/18 1204  BP:  135/62 133/62 (!) 116/56  Pulse:  74 73 67  Resp:  17 16 16   Temp:  98.3 F (36.8 C) 98.2 F (36.8 C) 98.1 F (36.7 C)  TempSrc:  Oral Oral Oral  SpO2:  97% 94% 96%  Weight: 99 kg     Height:        Intake/Output Summary (Last 24 hours) at 12/22/2018 1437 Last data filed at 12/22/2018 [id number] Gross per 24 hour  Intake -  Output 600 ml  Net -600 ml   Filed Weights   12/19/18 0459 12/21/18 0144 12/22/18 0253  Weight: 98.8 kg 100 kg 99 kg    Exam:  . General: 63 y.o. year-old female well developed well nourished in no acute distress.  Alert and oriented x3. . Cardiovascular: Regular rate and rhythm with no rubs or gallops.  No thyromegaly or JVD noted.   [name] Respiratory: Clear to auscultation with no wheezes or rales. Good inspiratory effort. . Abdomen: Soft nontender nondistended with normal bowel sounds x4 quadrants. . Musculoskeletal: Trace lower  extremity edema. 2/4 pulses in all 4 extremities. [name] Psychiatry: Mood is appropriate for condition and setting.  Affect is flat.   Data Reviewed: CBC: No results for input(s): WBC, NEUTROABS, HGB, HCT, MCV, PLT in the last 168 hours. Basic Metabolic Panel: No results for input(s): NA, K, CL, CO2, GLUCOSE, BUN, CREATININE, CALCIUM, MG, PHOS in the last 168 hours. GFR: Estimated  Creatinine Clearance: 84.4 mL/min (by C-G formula based on SCr of 0.57 mg/dL). Liver Function Tests: No results for input(s): AST, ALT, ALKPHOS, BILITOT, PROT, ALBUMIN in the last 168 hours. No results for input(s): LIPASE, AMYLASE in the last 168 hours. No results for input(s): AMMONIA in the last 168 hours. Coagulation Profile: No results for input(s): INR, PROTIME in the last 168 hours. Cardiac Enzymes: No results for input(s): CKTOTAL, CKMB, CKMBINDEX, TROPONINI in the last 168 hours. BNP (last 3 results) No results for input(s): PROBNP in the last 8760 hours. HbA1C: No results for input(s): HGBA1C in the last 72 hours. CBG: Recent Labs  Lab 12/21/18 1214 12/21/18 1630 12/21/18 2121 12/22/18 0618 12/22/18 1207  GLUCAP 177* 199* 191* 189* 215*   Lipid Profile: No results for input(s): CHOL, HDL, LDLCALC, TRIG, CHOLHDL, LDLDIRECT in the last 72 hours. Thyroid Function Tests: No results for input(s): TSH, T4TOTAL, FREET4, T3FREE, THYROIDAB in the last 72 hours. Anemia Panel: No results for input(s): VITAMINB12, FOLATE, FERRITIN, TIBC, IRON, RETICCTPCT in the last 72 hours. Urine analysis:    Component Value Date/Time   COLORURINE YELLOW 11/03/2018 0341   APPEARANCEUR CLOUDY (A) 11/03/2018 0341   LABSPEC 1.024 11/03/2018 0341   PHURINE 6.0 11/03/2018 0341   GLUCOSEU >=500 (A) 11/03/2018 0341   HGBUR MODERATE (A) 11/03/2018 0341   HGBUR negative 11/01/2008 1121   BILIRUBINUR NEGATIVE 11/03/2018 0341   BILIRUBINUR neg. 12/21/2012 0933   KETONESUR 80 (A) 11/03/2018 0341   PROTEINUR 30 (A) 11/03/2018 0341   UROBILINOGEN 0.2 12/21/2012 0933   UROBILINOGEN 0.2 11/01/2008 1121   NITRITE NEGATIVE 11/03/2018 0341   LEUKOCYTESUR MODERATE (A) 11/03/2018 0341   Sepsis Labs: @LABRCNTIP (procalcitonin:4,lacticidven:4)  )No results found for this or any previous visit (from the past 240 hour(s)).    Studies: No results found.  Scheduled Meds: . amLODipine  10 mg Oral Daily   . atorvastatin  80 mg Oral q1800  . carvedilol  6.25 mg Oral BID WC  . clopidogrel  75 mg Oral Daily  . enoxaparin (LOVENOX) injection  40 mg Subcutaneous Q24H  . feeding supplement  1 Container Oral BID BM  . feeding supplement (PRO-STAT SUGAR FREE 64)  30 mL Oral TID  . insulin aspart  0-15 Units Subcutaneous TID WC  . insulin aspart  0-5 Units Subcutaneous QHS  . insulin aspart  4 Units Subcutaneous TID WC  . insulin glargine  10 Units Subcutaneous QHS  . linagliptin  5 mg Oral Daily  . lisinopril  40 mg Oral Daily  . pantoprazole  40 mg Oral Daily  . sertraline  50 mg Oral Daily  . sodium chloride flush  10-40 mL Intracatheter Q12H  . thiamine  100 mg Oral Daily    Continuous Infusions:   LOS: 21 days     Kayleen Memos, MD [facility] Pager [phone]  If 7PM-7AM, please contact night-coverage [email or web address] Password [facility] 12/22/2018, 2:37 PM

## 2018-12-23 LAB — GLUCOSE, CAPILLARY
Glucose-Capillary: 201 mg/dL — ABNORMAL HIGH (ref 70–99)
Glucose-Capillary: 178 mg/dL — ABNORMAL HIGH (ref 70–99)
Glucose-Capillary: 122 mg/dL — ABNORMAL HIGH (ref 70–99)
Glucose-Capillary: 142 mg/dL — ABNORMAL HIGH (ref 70–99)

## 2018-12-23 NOTE — Progress Notes (Signed)
PROGRESS NOTE  Becky Stout [id number] DOB: 1955-09-07 DOA: 11/03/2018 PCP: Abner Greenspan, MD  HPI/Recap of past 66 hours: 63 year old female with PMH of DM 2, HTN, WPW syndrome, frequent falls, confusion, and weight loss was admitted for DKA, acute CVA and metabolic encephalopathy.  Prolonged hospital stay due to difficulty in SNF placement.  No new or acute issues ongoing per patient or nursing for several days now.  Remains hospitalized due to lack of safe discharge plan as noted above.  12/23/18: Seen and examined at her bedside this morning.  No new complaints.  No acute events overnight.  CSW assisting with placement.  Asked if she wanted me to call her family to give update, she declined stating that at this point she does not have any contact or interaction with her family.  Assessment/Plan: Principal Problem:   Failure to thrive in adult Active Problems:   Hypothyroidism   B12 deficiency   WOLFF (WOLFE)-PARKINSON-WHITE (WPW) SYNDROME   Essential hypertension   Hyperlipidemia   Poorly controlled type 2 diabetes mellitus (HCC)   Anxiety   Acute lower UTI   High anion gap metabolic acidosis   DKA (diabetic ketoacidoses) (HCC)   Cerebral thrombosis with cerebral infarction   DKA in type II DM, not at goal.  DKA resolved.  A1c 7.9.  CBGs currently reasonably controlled with mild intermittent increases on current regimen of Lantus 10 units at bedtime, NovoLog 4 units 3 times daily with meals, SSI and Tradjenta.  Continue current regimen.  Stable.  Essential hypertension Controlled on carvedilol, amlodipine, lisinopril.  Stable.  WPW history  CVA  Neurology signed off 10/11  Continue statins, Plavix, adequate blood pressure control.  Awaiting SNF for rehab.  As per discussion with clinical social work 11/19, difficult placement due to lack of insurance.  Normocytic anemia/B12 and iron deficiency, menorrhagia  Hemoglobin normal.  Anxiety and  agoraphobia  Continue Zoloft.  Stable.  Hypokalemia and hypomagnesemia  Replaced.  Thrombocytopenia  Platelet counts have dropped from 281 on 11/14to 49 on 11/18.  Repeated and normal.  Likely lab error.  Body mass index is 37.84 kg/m.  Nutritional Status Nutrition Problem: Inadequate oral intake Etiology: poor appetite Signs/Symptoms: per patient/family report Interventions: Prostat, Boost Breeze  DVT prophylaxis: Lovenox Code Status: DNR Family Communication: Patient's sister who is at bedside Disposition: Awaiting SNF, difficult placement.   Consultants:  Neurology Psychiatry  Procedures:    Antimicrobials:  None     Objective: Vitals:   12/23/18 0147 12/23/18 0413 12/23/18 0733 12/23/18 1145  BP:  (!) 144/60 (!) 127/54 (!) 124/57  Pulse:  71 73 78  Resp:  18 16 16   Temp:  98.4 F (36.9 C) 98.2 F (36.8 C) 98.2 F (36.8 C)  TempSrc:  Oral Oral Oral  SpO2:  94% 97% 95%  Weight: 100 kg     Height:       No intake or output data in the 24 hours ending 12/23/18 1420 Filed Weights   12/21/18 0144 12/22/18 0253 12/23/18 0147  Weight: 100 kg 99 kg 100 kg    Exam:  . General: 63 y.o. year-old female well-developed well-nourished in no acute distress.  Alert and interactive. . Cardiovascular: Regular rate and rhythm no rubs or gallops.  Respiratory: Clear to auscultation with no wheezes or rales.  Good inspiratory effort. . Abdomen: Soft nontender monitor normal bowel sounds.  Musculoskeletal: Trace lower extremity edema.   [name] Psychiatry: Mood is appropriate for condition and setting.  Data Reviewed: CBC: No results for input(s): WBC, NEUTROABS, HGB, HCT, MCV, PLT in the last 168 hours. Basic Metabolic Panel: No results for input(s): NA, K, CL, CO2, GLUCOSE, BUN, CREATININE, CALCIUM, MG, PHOS in the last 168 hours. GFR: Estimated Creatinine Clearance: 84.9 mL/min (by C-G formula based on SCr of 0.57 mg/dL). Liver Function Tests: No  results for input(s): AST, ALT, ALKPHOS, BILITOT, PROT, ALBUMIN in the last 168 hours. No results for input(s): LIPASE, AMYLASE in the last 168 hours. No results for input(s): AMMONIA in the last 168 hours. Coagulation Profile: No results for input(s): INR, PROTIME in the last 168 hours. Cardiac Enzymes: No results for input(s): CKTOTAL, CKMB, CKMBINDEX, TROPONINI in the last 168 hours. BNP (last 3 results) No results for input(s): PROBNP in the last 8760 hours. HbA1C: No results for input(s): HGBA1C in the last 72 hours. CBG: Recent Labs  Lab 12/22/18 1207 12/22/18 1715 12/22/18 2155 12/23/18 0618 12/23/18 1147  GLUCAP 215* 144* 164* 201* 178*   Lipid Profile: No results for input(s): CHOL, HDL, LDLCALC, TRIG, CHOLHDL, LDLDIRECT in the last 72 hours. Thyroid Function Tests: No results for input(s): TSH, T4TOTAL, FREET4, T3FREE, THYROIDAB in the last 72 hours. Anemia Panel: No results for input(s): VITAMINB12, FOLATE, FERRITIN, TIBC, IRON, RETICCTPCT in the last 72 hours. Urine analysis:    Component Value Date/Time   COLORURINE YELLOW 11/03/2018 0341   APPEARANCEUR CLOUDY (A) 11/03/2018 0341   LABSPEC 1.024 11/03/2018 0341   PHURINE 6.0 11/03/2018 0341   GLUCOSEU >=500 (A) 11/03/2018 0341   HGBUR MODERATE (A) 11/03/2018 0341   HGBUR negative 11/01/2008 1121   BILIRUBINUR NEGATIVE 11/03/2018 0341   BILIRUBINUR neg. 12/21/2012 0933   KETONESUR 80 (A) 11/03/2018 0341   PROTEINUR 30 (A) 11/03/2018 0341   UROBILINOGEN 0.2 12/21/2012 0933   UROBILINOGEN 0.2 11/01/2008 1121   NITRITE NEGATIVE 11/03/2018 0341   LEUKOCYTESUR MODERATE (A) 11/03/2018 0341   Sepsis Labs: @LABRCNTIP (procalcitonin:4,lacticidven:4)  )No results found for this or any previous visit (from the past 240 hour(s)).    Studies: No results found.  Scheduled Meds: . amLODipine  10 mg Oral Daily  . atorvastatin  80 mg Oral q1800  . carvedilol  6.25 mg Oral BID WC  . clopidogrel  75 mg Oral Daily   . enoxaparin (LOVENOX) injection  40 mg Subcutaneous Q24H  . feeding supplement  1 Container Oral BID BM  . feeding supplement (PRO-STAT SUGAR FREE 64)  30 mL Oral TID  . insulin aspart  0-15 Units Subcutaneous TID WC  . insulin aspart  0-5 Units Subcutaneous QHS  . insulin aspart  4 Units Subcutaneous TID WC  . insulin glargine  10 Units Subcutaneous QHS  . linagliptin  5 mg Oral Daily  . lisinopril  40 mg Oral Daily  . pantoprazole  40 mg Oral Daily  . sertraline  50 mg Oral Daily  . sodium chloride flush  10-40 mL Intracatheter Q12H  . thiamine  100 mg Oral Daily    Continuous Infusions:   LOS: 50 days     Kayleen Memos, MD [facility] Pager [phone]  If 7PM-7AM, please contact night-coverage [email or web address] Password [email or web address] 12/23/2018, 2:20 PM

## 2018-12-24 LAB — GLUCOSE, CAPILLARY
Glucose-Capillary: 175 mg/dL — ABNORMAL HIGH (ref 70–99)
Glucose-Capillary: 224 mg/dL — ABNORMAL HIGH (ref 70–99)
Glucose-Capillary: 101 mg/dL — ABNORMAL HIGH (ref 70–99)
Glucose-Capillary: 176 mg/dL — ABNORMAL HIGH (ref 70–99)

## 2018-12-24 NOTE — Progress Notes (Signed)
PROGRESS NOTE  Becky Stout [id number] DOB: 1955/06/14 DOA: 11/03/2018 PCP: Abner Greenspan, MD  HPI/Recap of past 62 hours: 63 year old female with PMH of DM 2, HTN, WPW syndrome, frequent falls, confusion, and weight loss was admitted for DKA, acute CVA and metabolic encephalopathy.  Prolonged hospital stay due to difficulty in SNF placement.  No new or acute issues ongoing per patient or nursing for several days now.  Remains hospitalized due to lack of safe discharge plan as noted above.  12/24/18: Seen and examined at bedside this morning. No acute events overnight. No new complaints. PT OT recommended SNF. CSW assisting with SNF placement.  Assessment/Plan: Principal Problem:   Failure to thrive in adult Active Problems:   Hypothyroidism   B12 deficiency   WOLFF (WOLFE)-PARKINSON-WHITE (WPW) SYNDROME   Essential hypertension   Hyperlipidemia   Poorly controlled type 2 diabetes mellitus (HCC)   Anxiety   Acute lower UTI   High anion gap metabolic acidosis   DKA (diabetic ketoacidoses) (HCC)   Cerebral thrombosis with cerebral infarction   DKA in type II DM, not at goal.  DKA resolved.  A1c 7.9.  CBGs currently reasonably controlled with mild intermittent increases on current regimen of Lantus 10 units at bedtime, NovoLog 4 units 3 times daily with meals, SSI and Tradjenta.  Continue current regimen.  Stable.  Essential hypertension Controlled on carvedilol, amlodipine, lisinopril.  Stable.  WPW history  CVA  Neurology signed off 10/11  Continue statins, Plavix, adequate blood pressure control.  Awaiting SNF for rehab.  As per discussion with clinical social work 11/19, difficult placement due to lack of insurance.  Normocytic anemia/B12 and iron deficiency, menorrhagia  Hemoglobin normal.  Anxiety and agoraphobia  Continue Zoloft.  Stable.  Hypokalemia and hypomagnesemia  Replaced.  Thrombocytopenia  Platelet counts have dropped from  281 on 11/14to 49 on 11/18.  Repeated and normal.  Likely lab error.  Body mass index is 37.84 kg/m.  Nutritional Status Nutrition Problem: Inadequate oral intake Etiology: poor appetite Signs/Symptoms: per patient/family report Interventions: Prostat, Boost Breeze  DVT prophylaxis: Lovenox Code Status: DNR Family Communication: Patient's sister who is at bedside Disposition: Awaiting SNF, difficult placement.   Consultants:  Neurology Psychiatry  Procedures:    Antimicrobials:  None     Objective: Vitals:   12/24/18 0728 12/24/18 0728 12/24/18 1000 12/24/18 1132  BP: 128/65 128/65 124/64 (!) 129/58  Pulse:  69  66  Resp:  15  16  Temp:  99.3 F (37.4 C)  98.4 F (36.9 C)  TempSrc:  Oral  Oral  SpO2:    98%  Weight:      Height:       No intake or output data in the 24 hours ending 12/24/18 1442 Filed Weights   12/22/18 0253 12/23/18 0147 12/24/18 0500  Weight: 99 kg 100 kg 99.5 kg    Exam:  . General: 63 y.o. year-old female well-developed well-nourished no acute distress. Alert oriented x3.  . Cardiovascular: Regular rate and rhythm no rubs or gallops.  [name] Respiratory: Clear to auscultation no wheezes or rales.  . Abdomen: Soft nontender bowel sounds present.  Musculoskeletal: Trace lower extremity edema bilaterally. Psychiatry: Mood is appropriate for condition and setting.  Data Reviewed: CBC: No results for input(s): WBC, NEUTROABS, HGB, HCT, MCV, PLT in the last 168 hours. Basic Metabolic Panel: No results for input(s): NA, K, CL, CO2, GLUCOSE, BUN, CREATININE, CALCIUM, MG, PHOS in the last 168 hours. GFR: Estimated Creatinine Clearance:  84.6 mL/min (by C-G formula based on SCr of 0.57 mg/dL). Liver Function Tests: No results for input(s): AST, ALT, ALKPHOS, BILITOT, PROT, ALBUMIN in the last 168 hours. No results for input(s): LIPASE, AMYLASE in the last 168 hours. No results for input(s): AMMONIA in the last 168 hours. Coagulation  Profile: No results for input(s): INR, PROTIME in the last 168 hours. Cardiac Enzymes: No results for input(s): CKTOTAL, CKMB, CKMBINDEX, TROPONINI in the last 168 hours. BNP (last 3 results) No results for input(s): PROBNP in the last 8760 hours. HbA1C: No results for input(s): HGBA1C in the last 72 hours. CBG: Recent Labs  Lab 12/23/18 1147 12/23/18 1546 12/23/18 2115 12/24/18 0624 12/24/18 1130  GLUCAP 178* 122* 142* 175* 224*   Lipid Profile: No results for input(s): CHOL, HDL, LDLCALC, TRIG, CHOLHDL, LDLDIRECT in the last 72 hours. Thyroid Function Tests: No results for input(s): TSH, T4TOTAL, FREET4, T3FREE, THYROIDAB in the last 72 hours. Anemia Panel: No results for input(s): VITAMINB12, FOLATE, FERRITIN, TIBC, IRON, RETICCTPCT in the last 72 hours. Urine analysis:    Component Value Date/Time   COLORURINE YELLOW 11/03/2018 0341   APPEARANCEUR CLOUDY (A) 11/03/2018 0341   LABSPEC 1.024 11/03/2018 0341   PHURINE 6.0 11/03/2018 0341   GLUCOSEU >=500 (A) 11/03/2018 0341   HGBUR MODERATE (A) 11/03/2018 0341   HGBUR negative 11/01/2008 1121   BILIRUBINUR NEGATIVE 11/03/2018 0341   BILIRUBINUR neg. 12/21/2012 0933   KETONESUR 80 (A) 11/03/2018 0341   PROTEINUR 30 (A) 11/03/2018 0341   UROBILINOGEN 0.2 12/21/2012 0933   UROBILINOGEN 0.2 11/01/2008 1121   NITRITE NEGATIVE 11/03/2018 0341   LEUKOCYTESUR MODERATE (A) 11/03/2018 0341   Sepsis Labs: @LABRCNTIP (procalcitonin:4,lacticidven:4)  )No results found for this or any previous visit (from the past 240 hour(s)).    Studies: No results found.  Scheduled Meds: . amLODipine  10 mg Oral Daily  . atorvastatin  80 mg Oral q1800  . carvedilol  6.25 mg Oral BID WC  . clopidogrel  75 mg Oral Daily  . enoxaparin (LOVENOX) injection  40 mg Subcutaneous Q24H  . feeding supplement  1 Container Oral BID BM  . feeding supplement (PRO-STAT SUGAR FREE 64)  30 mL Oral TID  . insulin aspart  0-15 Units Subcutaneous TID WC   . insulin aspart  0-5 Units Subcutaneous QHS  . insulin aspart  4 Units Subcutaneous TID WC  . insulin glargine  10 Units Subcutaneous QHS  . linagliptin  5 mg Oral Daily  . lisinopril  40 mg Oral Daily  . pantoprazole  40 mg Oral Daily  . sertraline  50 mg Oral Daily  . sodium chloride flush  10-40 mL Intracatheter Q12H  . thiamine  100 mg Oral Daily    Continuous Infusions:   LOS: 51 days     Kayleen Memos, MD [facility] Pager [phone]  If 7PM-7AM, please contact night-coverage [email or web address] Password [email or web address] 12/24/2018, 2:42 PM

## 2018-12-24 NOTE — TOC Progression Note (Signed)
Transition of Care [facility]) - Progression Note    Patient Details  Name: Becky Stout MRN: [id number] Date of Birth: 1955/12/04  Transition of Care [facility]) CM/SW [place], [place] Phone Number: 12/24/2018, 1:27 PM  Clinical Narrative:    No SNF bed offers exist. Patient continues to be on Difficult to Place list.   Expected Discharge Plan: [place] Barriers to Discharge: Continued Medical Work up, Inadequate or no insurance  Expected Discharge Plan and Services Expected Discharge Plan: [place] In-house Referral: Clinical Social Work Discharge Planning Services: CM Consult Post Acute Care Choice: [place] arrangements for the past 2 months: Single Family Home   Readmission Risk Interventions No flowsheet data found.

## 2018-12-24 NOTE — Progress Notes (Signed)
Physical Therapy Treatment Patient Details Name: Becky Stout MRN: [id number] DOB: [birth year] Today's Date: 12/24/2018    History of Present Illness 63 y.o. female with Past medical history of B12 deficiency, type II DM not on therapy, HTN, IDA, obesity, WPW syndrome.Pt dx with acute CVA, MRI reveals multiple cerebral and brainstem lacunar infarcts, acute metabolic encepholopathy, Hypertensive emergency, DKA, agorophobia, N/V, hypokalemia, and FTT.      PT Comments    Patient continues to be self limiting, but responded well to time limit and specific plans for OOB time.  She seems convinced that the bed is the safest place for her.  May benefit from trying a wheelchair to mobilize out of her room.  Feel she remains appropriate for SNF level rehab.  PT to follow acutely.  Goals and plan of care updated this session.  Follow Up Recommendations  SNF     Equipment Recommendations  Wheelchair (measurements PT);Wheelchair cushion (measurements PT);3in1 (PT);Rolling walker with 5" wheels    Recommendations for Other Services       Precautions / Restrictions Precautions Precautions: Fall Precaution Comments: extremely anxious, doesn't want to be touched (if at all possible) and fearful of falling    Mobility  Bed Mobility Overal bed mobility: Needs Assistance Bed Mobility: Supine to Sit;Sit to Supine     Supine to sit: Supervision;HOB elevated Sit to supine: Supervision   General bed mobility comments: using rail and increased time  Transfers Overall transfer level: Needs assistance Equipment used: Rolling walker (2 wheeled) Transfers: Sit to/from [facility] Sit to Stand: Mod assist Stand pivot transfers: Mod assist       General transfer comment: A for safety with transfers including lifting help from bed and from recliner, able to step to recliner, then back to bed with RW and min to mod A for balance/safety  Ambulation/Gait             General  Gait Details: NT pt self limiting   Stairs             Wheelchair Mobility    Modified Rankin (Stroke Patients Only)       Balance Overall balance assessment: Needs assistance Sitting-balance support: Feet supported Sitting balance-Leahy Scale: Fair Sitting balance - Comments: max encouragement and assist to even attempt to don socks sitting EOB.   Standing balance support: Bilateral upper extremity supported Standing balance-Leahy Scale: Poor Standing balance comment: reliant on BUE support on RW                            Cognition Arousal/Alertness: Awake/alert Behavior During Therapy: Anxious Overall Cognitive Status: No family/caregiver present to determine baseline cognitive functioning Area of Impairment: Following commands;Safety/judgement;Awareness;Problem solving;Attention                   Current Attention Level: Sustained Memory: Decreased recall of precautions;Decreased short-term memory Following Commands: Follows one step commands with increased time;Follows one step commands inconsistently Safety/Judgement: Decreased awareness of safety;Decreased awareness of deficits   Problem Solving: Slow processing;Decreased initiation;Difficulty sequencing;Requires verbal cues;Requires tactile cues General Comments: truly believes the bed is the best place for her due to her significant fear of falling, Repeats the plan over several times to make sure everything is in place and that she knows what to expect and that she won't fall.      Exercises      General Comments        Pertinent Vitals/Pain  Faces Pain Scale: No hurt    Home Living                      Prior Function            PT Goals (current goals can now be found in the care plan section) Acute Rehab PT Goals Patient Stated Goal: to sleep PT Goal Formulation: With patient Time For Goal Achievement: 01/07/19 Potential to Achieve Goals: Fair Progress towards PT  goals: Progressing toward goals    Frequency    Min 3X/week      PT Plan Current plan remains appropriate    Co-evaluation              AM-PAC PT "6 Clicks" Mobility   Outcome Measure  Help needed turning from your back to your side while in a flat bed without using bedrails?: None Help needed moving from lying on your back to sitting on the side of a flat bed without using bedrails?: None Help needed moving to and from a bed to a chair (including a wheelchair)?: A [place] Help needed standing up from a chair using your arms (e.g., wheelchair or bedside chair)?: A Lot Help needed to walk in hospital room?: A Lot Help needed climbing 3-5 steps with a railing? : Total 6 Click Score: 16    End of Session Equipment Utilized During Treatment: Gait belt Activity Tolerance: Patient limited by fatigue Patient left: in bed;with bed alarm set   PT Visit Diagnosis: Other abnormalities of gait and mobility (R26.89)     Time: 1410-1433 PT Time Calculation (min) (ACUTE ONLY): 23 min  Charges:  $Therapeutic Activity: 23-37 mins                     Magda Kiel, [place] Acute Rehabilitation Services [phone] 12/24/2018    Becky Stout 12/24/2018, 3:46 PM

## 2018-12-25 LAB — GLUCOSE, CAPILLARY
Glucose-Capillary: 158 mg/dL — ABNORMAL HIGH (ref 70–99)
Glucose-Capillary: 226 mg/dL — ABNORMAL HIGH (ref 70–99)
Glucose-Capillary: 104 mg/dL — ABNORMAL HIGH (ref 70–99)
Glucose-Capillary: 160 mg/dL — ABNORMAL HIGH (ref 70–99)

## 2018-12-25 NOTE — Progress Notes (Signed)
PROGRESS NOTE  Becky Stout [id number] DOB: 03/14/1955 DOA: 11/03/2018 PCP: Abner Greenspan, MD  HPI/Recap of past 74 hours: 63 year old female with PMH of DM 2, HTN, WPW syndrome, frequent falls, confusion, and weight loss was admitted for DKA, acute CVA and metabolic encephalopathy.  Prolonged hospital stay due to difficulty in SNF placement.  No new or acute issues ongoing per patient or nursing for several days now.  Remains hospitalized due to lack of safe discharge plan as noted above.  12/25/18: Patient was seen and examined at her bedside this morning.  No acute events overnight.  She has no new complaints.  PT OT assessment recommended SNF.  CSW assisting with SNF placement.   Assessment/Plan: Principal Problem:   Failure to thrive in adult Active Problems:   Hypothyroidism   B12 deficiency   WOLFF (WOLFE)-PARKINSON-WHITE (WPW) SYNDROME   Essential hypertension   Hyperlipidemia   Poorly controlled type 2 diabetes mellitus (HCC)   Anxiety   Acute lower UTI   High anion gap metabolic acidosis   DKA (diabetic ketoacidoses) (HCC)   Cerebral thrombosis with cerebral infarction   DKA in type II DM, not at goal.  DKA resolved.  A1c 7.9. Some intermittent increase of CBG on current regimen of Lantus 10 units at bedtime, NovoLog 4 units 3 times daily with meals, SSI and Tradjenta.    Essential hypertension Blood pressure is at goal. Continue carvedilol, amlodipine, lisinopril.    WPW history No acute issues.  CVA  Neurology signed off 10/11  Continue statins, Plavix, adequate blood pressure control.  Awaiting SNF for rehab.  As per discussion with clinical social worker difficult placement due to lack of insurance.  Normocytic anemia/B12 and iron deficiency, menorrhagia  Hemoglobin normal.  Anxiety and agoraphobia  Continue Zoloft.  Stable.  Resolved post repletion: Hypokalemia and hypomagnesemia  Replaced.  Resolved thrombocytopenia   Possibly lab error.  Obesity Body mass index is 37.84 kg/m. Recommend weight loss with regular physical activity and healthy dieting.   DVT prophylaxis: Lovenox subcu daily. Code Status: DNR Family Communication:  None at bedside. Disposition: Awaiting SNF, difficult placement.   Consultants:  Neurology Psychiatry CSW  Procedures:  None  Antimicrobials:  None     Objective: Vitals:   12/25/18 0346 12/25/18 0732 12/25/18 0924 12/25/18 1146  BP: 138/60 (!) 129/59 135/67 134/60  Pulse: 71 67  64  Resp: 16 18  16   Temp: 98.7 F (37.1 C) 98.4 F (36.9 C)  98.1 F (36.7 C)  TempSrc: Oral Oral  Oral  SpO2: 97% 97%  99%  Weight:      Height:        Intake/Output Summary (Last 24 hours) at 12/25/2018 1349 Last data filed at 12/25/2018 0600 Gross per 24 hour  Intake 240 ml  Output -  Net 240 ml   Filed Weights   12/22/18 0253 12/23/18 0147 12/24/18 0500  Weight: 99 kg 100 kg 99.5 kg    Exam:  . General: 63 y.o. year-old female obese in no acute distress.  Alert and oriented x3.   . Cardiovascular: Regular rate and rhythm no rubs or gallops..  . Respiratory: Clear to Auscultation No Wheezes or Rales.  . Abdomen: Soft nontender nondistended normal bowel sounds present. Musculoskeletal: Trace lower extremity edema bilaterally. Psychiatry: Flat affect.  Data Reviewed: CBC: No results for input(s): WBC, NEUTROABS, HGB, HCT, MCV, PLT in the last 168 hours. Basic Metabolic Panel: No results for input(s): NA, K, CL, CO2, GLUCOSE, BUN,  CREATININE, CALCIUM, MG, PHOS in the last 168 hours. GFR: Estimated Creatinine Clearance: 84.6 mL/min (by C-G formula based on SCr of 0.57 mg/dL). Liver Function Tests: No results for input(s): AST, ALT, ALKPHOS, BILITOT, PROT, ALBUMIN in the last 168 hours. No results for input(s): LIPASE, AMYLASE in the last 168 hours. No results for input(s): AMMONIA in the last 168 hours. Coagulation Profile: No results for input(s):  INR, PROTIME in the last 168 hours. Cardiac Enzymes: No results for input(s): CKTOTAL, CKMB, CKMBINDEX, TROPONINI in the last 168 hours. BNP (last 3 results) No results for input(s): PROBNP in the last 8760 hours. HbA1C: No results for input(s): HGBA1C in the last 72 hours. CBG: Recent Labs  Lab 12/24/18 1130 12/24/18 1631 12/24/18 2120 12/25/18 0634 12/25/18 1144  GLUCAP 224* 101* 176* 158* 226*   Lipid Profile: No results for input(s): CHOL, HDL, LDLCALC, TRIG, CHOLHDL, LDLDIRECT in the last 72 hours. Thyroid Function Tests: No results for input(s): TSH, T4TOTAL, FREET4, T3FREE, THYROIDAB in the last 72 hours. Anemia Panel: No results for input(s): VITAMINB12, FOLATE, FERRITIN, TIBC, IRON, RETICCTPCT in the last 72 hours. Urine analysis:    Component Value Date/Time   COLORURINE YELLOW 11/03/2018 0341   APPEARANCEUR CLOUDY (A) 11/03/2018 0341   LABSPEC 1.024 11/03/2018 0341   PHURINE 6.0 11/03/2018 0341   GLUCOSEU >=500 (A) 11/03/2018 0341   HGBUR MODERATE (A) 11/03/2018 0341   HGBUR negative 11/01/2008 1121   BILIRUBINUR NEGATIVE 11/03/2018 0341   BILIRUBINUR neg. 12/21/2012 0933   KETONESUR 80 (A) 11/03/2018 0341   PROTEINUR 30 (A) 11/03/2018 0341   UROBILINOGEN 0.2 12/21/2012 0933   UROBILINOGEN 0.2 11/01/2008 1121   NITRITE NEGATIVE 11/03/2018 0341   LEUKOCYTESUR MODERATE (A) 11/03/2018 0341   Sepsis Labs: @LABRCNTIP (procalcitonin:4,lacticidven:4)  )No results found for this or any previous visit (from the past 240 hour(s)).    Studies: No results found.  Scheduled Meds: . amLODipine  10 mg Oral Daily  . atorvastatin  80 mg Oral q1800  . carvedilol  6.25 mg Oral BID WC  . clopidogrel  75 mg Oral Daily  . enoxaparin (LOVENOX) injection  40 mg Subcutaneous Q24H  . feeding supplement  1 Container Oral BID BM  . feeding supplement (PRO-STAT SUGAR FREE 64)  30 mL Oral TID  . insulin aspart  0-15 Units Subcutaneous TID WC  . insulin aspart  0-5 Units  Subcutaneous QHS  . insulin aspart  4 Units Subcutaneous TID WC  . insulin glargine  10 Units Subcutaneous QHS  . linagliptin  5 mg Oral Daily  . lisinopril  40 mg Oral Daily  . pantoprazole  40 mg Oral Daily  . sertraline  50 mg Oral Daily  . sodium chloride flush  10-40 mL Intracatheter Q12H  . thiamine  100 mg Oral Daily    Continuous Infusions:   LOS: 52 days     Kayleen Memos, MD [facility] Pager [phone]  If 7PM-7AM, please contact night-coverage [email or web address] Password [email or web address] 12/25/2018, 1:49 PM

## 2018-12-25 NOTE — Progress Notes (Signed)
OT Cancellation Note  Patient Details Name: Becky Stout MRN: [id number] DOB: May 20, 1955   Cancelled Treatment:    Reason Eval/Treat Not Completed: Patient declined, no reason specified Pt persisted to decline working with therapy this date, despite maximum education and encouragement on benefit of working with OT. When prompted with self-care tasks pt declined stating "Oh, I'll do that later". Therapist dropped the bed rail and pt became agitated with therapist stating "put that back, I am not getting out of bed!". Pt agreed to try again tomorrow. OT will return as time allows and pt is appropriate.   Dorinda Hill OTR/L Acute Rehabilitation Services Office: [place] 12/25/2018, 10:56 AM

## 2018-12-25 NOTE — Progress Notes (Signed)
Patients' sister Morey Hummingbird, has questions regarding/interested in psych consult. She is not available during the day, so please call Cecille Rubin (sister) at [phone]

## 2018-12-26 LAB — GLUCOSE, CAPILLARY
Glucose-Capillary: 179 mg/dL — ABNORMAL HIGH (ref 70–99)
Glucose-Capillary: 200 mg/dL — ABNORMAL HIGH (ref 70–99)
Glucose-Capillary: 191 mg/dL — ABNORMAL HIGH (ref 70–99)
Glucose-Capillary: 181 mg/dL — ABNORMAL HIGH (ref 70–99)

## 2018-12-26 LAB — BASIC METABOLIC PANEL
Anion gap: 14 (ref 5–15)
BUN: 17 mg/dL (ref 8–23)
CO2: 26 mmol/L (ref 22–32)
Calcium: 9.4 mg/dL (ref 8.9–10.3)
Chloride: 101 mmol/L (ref 98–111)
Creatinine, Ser: 0.56 mg/dL (ref 0.44–1.00)
GFR calc Af Amer: >60 mL/min (ref >60)
GFR calc non Af Amer: >60 mL/min (ref >60)
Glucose, Bld: 188 mg/dL — ABNORMAL HIGH (ref 70–99)
Potassium: 3 mmol/L — ABNORMAL LOW (ref 3.5–5.1)
Sodium: 141 mmol/L (ref 135–145)

## 2018-12-26 LAB — CBC
HCT: 39.2 % (ref 36.0–46.0)
Hemoglobin: 13.3 g/dL (ref 12.0–15.0)
MCH: 31 pg (ref 26.0–34.0)
MCHC: 33.9 g/dL (ref 30.0–36.0)
MCV: 91.4 fL (ref 80.0–100.0)
Platelets: 361 10*3/uL (ref 150–400)
RBC: 4.29 MIL/uL (ref 3.87–5.11)
RDW: 12.7 % (ref 11.5–15.5)
WBC: 12.3 10*3/uL — ABNORMAL HIGH (ref 4.0–10.5)
nRBC: 0 % (ref 0.0–0.2)

## 2018-12-26 LAB — MAGNESIUM: Magnesium: 1.8 mg/dL (ref 1.7–2.4)

## 2018-12-26 MED ORDER — POTASSIUM CHLORIDE CRYS ER 20 MEQ PO TBCR
40.0000 meq | EXTENDED_RELEASE_TABLET | ORAL | Status: AC
  Administered 2018-12-26: 40 meq via ORAL
  Filled 2018-12-26 (×2): qty 2

## 2018-12-26 NOTE — Progress Notes (Signed)
Occupational Therapy Treatment Patient Details Name: Becky Stout MRN: [id number] DOB: January 08, 1956 Today's Date: 12/26/2018    History of present illness 63 y.o. female with Past medical history of B12 deficiency, type II DM not on therapy, HTN, IDA, obesity, WPW syndrome.Pt dx with acute CVA, MRI reveals multiple cerebral and brainstem lacunar infarcts, acute metabolic encepholopathy, Hypertensive emergency, DKA, agorophobia, N/V, hypokalemia, and FTT.     OT comments  Pt not progressing toward established goals secondary to self-limiting behavior. Pt is unmotivated and declines participation during all ADL despite max prompting and encouragement, pt continues to state "I'll do it later". Pt agreeable to transfer to recliner for one hour, following discussion with MD. Pt required increased time and effort to progress to EOB this date. She required modA for stand-pivot transfer, tolerated sitting up for one hour, then required modA for stand-pivot to return to bed. Pt follows one-step commands <10% of the time this session, perseverating on returning to and staying in bed. Pt will continue to benefit from skilled OT services to maximize safety and independence with ADL/IADL and functional mobility. Will continue to follow acutely and progress as tolerated.    Follow Up Recommendations  SNF    Equipment Recommendations  Other (comment)(TBD)    Recommendations for Other Services  Psych consult    Precautions / Restrictions Precautions Precautions: Fall Precaution Comments: extremely anxious, doesn't want to be touched (if at all possible) and fearful of falling Restrictions Weight Bearing Restrictions: No       Mobility Bed Mobility Overal bed mobility: Needs Assistance Bed Mobility: Supine to Sit;Sit to Supine     Supine to sit: Supervision;HOB elevated Sit to supine: Supervision   General bed mobility comments: use of bed rail and increased time, pt demonstrated increased effort  to progress trunk toupright postion  Transfers Overall transfer level: Needs assistance Equipment used: Rolling walker (2 wheeled) Transfers: Sit to/from [facility] Sit to Stand: Mod assist Stand pivot transfers: Mod assist       General transfer comment: A for safety with transfers including lifting help from bed and from recliner, able to step to recliner, then back to bed with RW and min to mod A for balance/safety    Balance Overall balance assessment: Needs assistance Sitting-balance support: Feet supported Sitting balance-Leahy Scale: Fair Sitting balance - Comments: minguard for sitting balance   Standing balance support: Bilateral upper extremity supported Standing balance-Leahy Scale: Poor Standing balance comment: reliant on BUE support on RW                           ADL either performed or assessed with clinical judgement   ADL Overall ADL's : Needs assistance/impaired Eating/Feeding: Set up;Sitting   Grooming: Maximal assistance   Upper Body Bathing: Maximal assistance   Lower Body Bathing: Maximal assistance   Upper Body Dressing : Maximal assistance   Lower Body Dressing: Maximal assistance   Toilet Transfer: Minimal assistance;Moderate assistance;Stand-pivot;RW Toilet Transfer Details (indicate cue type and reason): simulated to and from the recliner         Functional mobility during ADLs: Moderate assistance;Maximal assistance;Rolling walker General ADL Comments: pt unmotivated to particpate in any aspect of ADL, despite prompting and max encouragement pt states "oh, I will do it later"     Vision       Perception     Praxis      Cognition Arousal/Alertness: Awake/alert Behavior During Therapy: Anxious;Agitated Overall Cognitive  Status: No family/caregiver present to determine baseline cognitive functioning Area of Impairment: Following commands;Safety/judgement;Awareness;Problem solving;Attention                    Current Attention Level: Focused   Following Commands: Follows one step commands inconsistently Safety/Judgement: Decreased awareness of safety;Decreased awareness of deficits Awareness: Emergent Problem Solving: Slow processing;Decreased initiation;Difficulty sequencing;Requires verbal cues;Requires tactile cues General Comments: pt becoming more agitated this session with returning to bed, pt attempting to sit prematurely, therapist directed pt to stand upright, pt stated "no i am sitting" with agitated tone. Therpist provided educated prior to transfer about steps and safe hand placement and body positioning to maximize safety;pt did not appear to retain education from therapist and continued to proceed in her own way. Pt declined all aspects of ADL despite max encouragement and prompting from therapist. Pt perseverates on getting back to bed, once in bed continued to decline ADL stating she "will do it later".        Exercises     Shoulder Instructions       General Comments visited pt at 11:20 to transfer her from bed to recliner, pt agreeable to sit up for one hour    Pertinent Vitals/ Pain       Pain Assessment: No/denies pain  Home Living                                          Prior Functioning/Environment              Frequency  Min 2X/week        Progress Toward Goals  OT Goals(current goals can now be found in the care plan section)  Progress towards OT goals: Not progressing toward goals - comment(self limiting behaviors)  Acute Rehab OT Goals Patient Stated Goal: to stay in bed OT Goal Formulation: With patient Time For Goal Achievement: 01/09/19 Potential to Achieve Goals: Poor ADL Goals Pt Will Perform Grooming: with supervision;sitting Pt Will Perform Upper Body Dressing: with set-up;with supervision;sitting Pt Will Perform Lower Body Dressing: with min assist;sit to/from stand Pt Will Transfer to Toilet: with min  assist;stand pivot transfer;bedside commode Additional ADL Goal #1: Pt will follow 1 step commands consistently with min cues Additional ADL Goal #2: Pt will require min cues for sequencing simple, familiar grooming task  Plan Discharge plan remains appropriate    Co-evaluation                 AM-PAC OT "6 Clicks" Daily Activity     Outcome Measure   Help from another person eating meals?: A [place] Help from another person taking care of personal grooming?: A Lot Help from another person toileting, which includes using toliet, bedpan, or urinal?: A Lot Help from another person bathing (including washing, rinsing, drying)?: A Lot Help from another person to put on and taking off regular upper body clothing?: A Lot Help from another person to put on and taking off regular lower body clothing?: A Lot 6 Click Score: 13    End of Session Equipment Utilized During Treatment: Gait belt;Rolling walker  OT Visit Diagnosis: Unsteadiness on feet (R26.81);Other abnormalities of gait and mobility (R26.89);Muscle weakness (generalized) (M62.81);History of falling (Z91.81);Other symptoms and signs involving cognitive function;Adult, failure to thrive (R62.7)   Activity Tolerance Patient tolerated treatment well(treatment limited secondary to self-limiting behavior)   Patient Left in bed;with  call bell/phone within reach;with nursing/sitter in room   Nurse Communication Mobility status        Time: [id number] OT Time Calculation (min): 19 min  Charges: OT General Charges $OT Visit: 1 Visit OT Treatments $Self Care/Home Management : 8-22 mins  Dorinda Hill OTR/L [place] Office: [place] 12/26/2018, 1:22 PM

## 2018-12-26 NOTE — Progress Notes (Addendum)
PROGRESS NOTE   Becky Stout  [id number]    DOB: 12-Jul-1955    DOA: 11/03/2018  PCP: Abner Greenspan, MD   I have briefly reviewed patients previous medical records in [facility].  Chief Complaint  Patient presents with  . Nausea  . Emesis  . Altered Mental Status    Brief Narrative:  63 year old female with PMH of DM 2, HTN, WPW syndrome, frequent falls, confusion, and weight loss was admitted for DKA, acute CVA and metabolic encephalopathy.  Patient has been medically stable for discharge for several weeks but ongoing prolonged hospital stay due to difficulty in SNF placement.   Assessment & Plan:   Principal Problem:   Failure to thrive in adult Active Problems:   Hypothyroidism   B12 deficiency   WOLFF (WOLFE)-PARKINSON-WHITE (WPW) SYNDROME   Essential hypertension   Hyperlipidemia   Poorly controlled type 2 diabetes mellitus (HCC)   Anxiety   Acute lower UTI   High anion gap metabolic acidosis   DKA (diabetic ketoacidoses) (HCC)   Cerebral thrombosis with cerebral infarction   DKA in type II DM, not at goal.  DKA resolved.  A1c 7.9.  Currently on Lantus 10 units at bedtime, NovoLog 4 units 3 times daily with meals, SSI and Tradjenta.  Mildly uncontrolled at times but otherwise reasonable.  Continue current regimen without change.  Essential hypertension  Controlled on carvedilol, amlodipine, lisinopril.  Well-controlled on current regimen  WPW history  No active issues   CVA  Neurology signed off 10/11  Continue statins, Plavix, modify secondary risk factors such as DM/HTN control  Awaiting SNF for rehab, difficult placement  Patient not cooperative/refusing to consistently work with therapies of mobilize.  Encouraged her to do so.  Discussed with RN and OT to push her to work with therapies.  Normocytic anemia/B12 and iron deficiency, menorrhagia  Hemoglobin normal.  Anxiety and agoraphobia  Continue Zoloft.  Stable.   Hypokalemia and hypomagnesemia  Potassium 3.0 on 12/2, replace and follow.  Check and replace magnesium as needed.  Thrombocytopenia  Suspect lab error, had resolved on repeat testing  Body mass index is 37.09 kg/m./Obesity  Nutritional Status Nutrition Problem: Inadequate oral intake Etiology: poor appetite Signs/Symptoms: per patient/family report Interventions: Prostat, Boost Breeze  DVT prophylaxis: Lovenox Code Status: DNR Family Communication: None at bedside. Discussed with patient's sister, updated care and answered questions. Disposition: Awaiting SNF, difficult placement.   Consultants:  Neurology Psychiatry  Procedures:    Antimicrobials:  None   Subjective: Patient interviewed and examined along with her female RN in room.  As per chart review, therapy is limited due to self limiting behavior.  Patient denies complaints.  Advised her that she should not refuse to get out of bed and mobilize with physical therapy.  She reluctantly agreed.  Objective:  Vitals:   12/26/18 0604 12/26/18 0753 12/26/18 1214 12/26/18 1538  BP:  126/60 118/77 (!) 135/58  Pulse:  73 77 72  Resp:  16 20 16   Temp:  98.9 F (37.2 C) 97.9 F (36.6 C) 98.9 F (37.2 C)  TempSrc:  Oral Oral Oral  SpO2:  100% 97% 99%  Weight: 98 kg     Height:        Examination:  General exam: Middle-aged female, moderately built and obese lying comfortably propped up in bed. Respiratory system: Clear to auscultation.  No increased work of breathing. Cardiovascular system: S1 & S2 heard, RRR. No JVD, murmurs, rubs, gallops or clicks. No pedal  edema. Gastrointestinal system: Abdomen is nondistended, soft and nontender. No organomegaly or masses felt. Normal bowel sounds heard. Central nervous system: Alert and oriented. No focal neurological deficits. Extremities: Symmetric 5 x 5 power. Skin: No rashes, lesions or ulcers Psychiatry: Judgement and insight appear impaired. Mood & affect flat.     Data Reviewed: I have personally reviewed following labs and imaging studies   CBC: Recent Labs  Lab 12/26/18 0724  WBC 12.3*  HGB 13.3  HCT 39.2  MCV 91.4  PLT [id number]    Basic Metabolic Panel: Recent Labs  Lab 12/26/18 0724  NA 141  K 3.0*  CL 101  CO2 26  GLUCOSE 188*  BUN 17  CREATININE 0.56  CALCIUM 9.4    Liver Function Tests: No results for input(s): AST, ALT, ALKPHOS, BILITOT, PROT, ALBUMIN in the last 168 hours.  CBG: Recent Labs  Lab 12/25/18 1144 12/25/18 1628 12/25/18 2111 12/26/18 0556 12/26/18 1217  GLUCAP 226* 104* 160* 179* 200*    No results found for this or any previous visit (from the past 240 hour(s)).    Radiology Studies: No results found.        Scheduled Meds: . amLODipine  10 mg Oral Daily  . atorvastatin  80 mg Oral q1800  . carvedilol  6.25 mg Oral BID WC  . clopidogrel  75 mg Oral Daily  . enoxaparin (LOVENOX) injection  40 mg Subcutaneous Q24H  . feeding supplement  1 Container Oral BID BM  . feeding supplement (PRO-STAT SUGAR FREE 64)  30 mL Oral TID  . insulin aspart  0-15 Units Subcutaneous TID WC  . insulin aspart  0-5 Units Subcutaneous QHS  . insulin aspart  4 Units Subcutaneous TID WC  . insulin glargine  10 Units Subcutaneous QHS  . linagliptin  5 mg Oral Daily  . lisinopril  40 mg Oral Daily  . pantoprazole  40 mg Oral Daily  . sertraline  50 mg Oral Daily  . sodium chloride flush  10-40 mL Intracatheter Q12H  . thiamine  100 mg Oral Daily   Continuous Infusions:   LOS: 55 days     Vernell Leep, MD, [place], [facility]. [facility]  To contact the attending provider between 7A-7P or the covering provider during after hours 7P-7A, please log into the web site [email or web address] and access using universal [place] password for that web site. If you do not have the password, please call the hospital operator.  12/26/2018, 5:02 PM

## 2018-12-27 LAB — GLUCOSE, CAPILLARY
Glucose-Capillary: 172 mg/dL — ABNORMAL HIGH (ref 70–99)
Glucose-Capillary: 219 mg/dL — ABNORMAL HIGH (ref 70–99)
Glucose-Capillary: 131 mg/dL — ABNORMAL HIGH (ref 70–99)
Glucose-Capillary: 239 mg/dL — ABNORMAL HIGH (ref 70–99)

## 2018-12-27 LAB — BASIC METABOLIC PANEL
Anion gap: 11 (ref 5–15)
BUN: 18 mg/dL (ref 8–23)
CO2: 24 mmol/L (ref 22–32)
Calcium: 9.3 mg/dL (ref 8.9–10.3)
Chloride: 104 mmol/L (ref 98–111)
Creatinine, Ser: 0.63 mg/dL (ref 0.44–1.00)
GFR calc Af Amer: >60 mL/min (ref >60)
GFR calc non Af Amer: >60 mL/min (ref >60)
Glucose, Bld: 175 mg/dL — ABNORMAL HIGH (ref 70–99)
Potassium: 3.8 mmol/L (ref 3.5–5.1)
Sodium: 139 mmol/L (ref 135–145)

## 2018-12-27 NOTE — Progress Notes (Signed)
Physical Therapy Treatment Patient Details Name: Becky Stout MRN: [id number] DOB: 12/05/1955 Today's Date: 12/27/2018    History of Present Illness 63 y.o. female with Past medical history of B12 deficiency, type II DM not on therapy, HTN, IDA, obesity, WPW syndrome.Pt dx with acute CVA, MRI reveals multiple cerebral and brainstem lacunar infarcts, acute metabolic encepholopathy, Hypertensive emergency, DKA, agorophobia, N/V, hypokalemia, and FTT.      PT Comments    Pt performed gt training and functional mobility with min to moderate assistance,  She continues to self limit progress due to fear and anxiety.  Pt did perform 6 feet of gt training.  Pt appears fearful to leave room as she always reports for her door to be shut all the way.  Continue to recommend SNF based on need for physical assistance.     Follow Up Recommendations  SNF     Equipment Recommendations  Wheelchair (measurements PT);Wheelchair cushion (measurements PT);3in1 (PT);Rolling walker with 5" wheels    Recommendations for Other Services       Precautions / Restrictions Precautions Precautions: Fall Precaution Comments: extremely anxious, doesn't want to be touched (if at all possible) and fearful of falling Restrictions Weight Bearing Restrictions: No    Mobility  Bed Mobility Overal bed mobility: Needs Assistance Bed Mobility: Supine to Sit;Sit to Supine Rolling: Supervision         General bed mobility comments: use of bed rail and increased time, pt demonstrated increased effort to progress trunk to upright postion  Transfers Overall transfer level: Needs assistance Equipment used: Rolling walker (2 wheeled) Transfers: Sit to/from [facility] Sit to Stand: Mod assist Stand pivot transfers: Mod assist       General transfer comment: Pt required moderate assistance to achieve standing with cues for hand placement to and from seated surface.  Pt with poor safety when backing  to seated surface,  Ambulation/Gait Ambulation/Gait assistance: Min assist Gait Distance (Feet): 6 Feet Assistive device: Rolling walker (2 wheeled) Gait Pattern/deviations: Step-to pattern;Shuffle;Trunk flexed;Wide base of support     General Gait Details: Moved chair away from bed to allow for increased gt distance this session.  She refused to ambulate to the door and requested to have the door shut.  She required assistance for turning and cues to advance LEs to edge of bed.   Stairs             Wheelchair Mobility    Modified Rankin (Stroke Patients Only) Modified Rankin (Stroke Patients Only) Pre-Morbid Rankin Score: Moderate disability Modified Rankin: Moderately severe disability     Balance Overall balance assessment: Needs assistance Sitting-balance support: Feet supported Sitting balance-Leahy Scale: Fair       Standing balance-Leahy Scale: Poor                              Cognition Arousal/Alertness: Awake/alert Behavior During Therapy: Anxious;Agitated Overall Cognitive Status: No family/caregiver present to determine baseline cognitive functioning Area of Impairment: Following commands;Safety/judgement;Awareness;Problem solving;Attention                 Orientation Level: Situation;Disoriented to Current Attention Level: Focused Memory: Decreased recall of precautions;Decreased short-term memory Following Commands: Follows one step commands inconsistently Safety/Judgement: Decreased awareness of safety;Decreased awareness of deficits Awareness: Emergent Problem Solving: Slow processing;Decreased initiation;Difficulty sequencing;Requires verbal cues;Requires tactile cues General Comments: Pt remains to self limit due to fear and anxiety.  Pt is slow and guarded.  Pt required  max VCs to ensure she is safe and that we will not put her in harms way.  She is very particular about the set up of the room.      Exercises       General Comments        Pertinent Vitals/Pain Pain Assessment: No/denies pain Pain Score: 0-No pain Pain Location: generalized discomfort. Pain Descriptors / Indicators: Burning Pain Intervention(s): Monitored during session;Repositioned    Home Living                      Prior Function            PT Goals (current goals can now be found in the care plan section) Acute Rehab PT Goals Patient Stated Goal: to stay in bed Potential to Achieve Goals: Fair Progress towards PT goals: Progressing toward goals    Frequency    Min 3X/week      PT Plan Current plan remains appropriate    Co-evaluation              AM-PAC PT "6 Clicks" Mobility   Outcome Measure  Help needed turning from your back to your side while in a flat bed without using bedrails?: None Help needed moving from lying on your back to sitting on the side of a flat bed without using bedrails?: None Help needed moving to and from a bed to a chair (including a wheelchair)?: A Guo Help needed standing up from a chair using your arms (e.g., wheelchair or bedside chair)?: A Dugar Help needed to walk in hospital room?: A [place] Help needed climbing 3-5 steps with a railing? : A Nofsinger 6 Click Score: 20    End of Session Equipment Utilized During Treatment: Gait belt Activity Tolerance: Patient limited by fatigue Patient left: in bed;with bed alarm set Nurse Communication: Mobility status PT Visit Diagnosis: Other abnormalities of gait and mobility (R26.89)     Time: [id number] PT Time Calculation (min) (ACUTE ONLY): 21 min  Charges:  $Gait Training: 8-22 mins                     Erasmo Leventhal , PTA Acute Rehabilitation Services Pager [phone] Office [phone]     [name] [name] 12/27/2018, 2:59 PM

## 2018-12-27 NOTE — Progress Notes (Signed)
PROGRESS NOTE   Becky Stout  [id number]    DOB: 06-26-1955    DOA: 11/03/2018  PCP: Abner Greenspan, MD   I have briefly reviewed patients previous medical records in [facility].  Chief Complaint  Patient presents with  . Nausea  . Emesis  . Altered Mental Status    Brief Narrative:  63 year old female with PMH of DM 2, HTN, WPW syndrome, frequent falls, confusion, and weight loss was admitted for DKA, acute CVA and metabolic encephalopathy.  Patient has been medically stable for discharge for several weeks but ongoing prolonged hospital stay due to difficulty in SNF placement.   Assessment & Plan:   Principal Problem:   Failure to thrive in adult Active Problems:   Hypothyroidism   B12 deficiency   WOLFF (WOLFE)-PARKINSON-WHITE (WPW) SYNDROME   Essential hypertension   Hyperlipidemia   Poorly controlled type 2 diabetes mellitus (HCC)   Anxiety   Acute lower UTI   High anion gap metabolic acidosis   DKA (diabetic ketoacidoses) (HCC)   Cerebral thrombosis with cerebral infarction   DKA in type II DM, not at goal.  DKA resolved.  A1c 7.9.  Currently on Lantus 10 units at bedtime, NovoLog 4 units 3 times daily with meals, SSI and Tradjenta.  Mildly uncontrolled at times but otherwise reasonable.  Continue current management.  No changes in regimen.  Essential hypertension  Controlled on carvedilol, amlodipine, lisinopril.  Well-controlled on current regimen  WPW history  No active issues   CVA  Neurology signed off 10/11  Continue statins, Plavix, modify secondary risk factors such as DM/HTN control  Awaiting SNF for rehab, difficult placement  Patient not cooperative/refusing to consistently work with therapies of mobilize.  All care team to continue to encourage patient to mobilize.  Normocytic anemia/B12 and iron deficiency, menorrhagia  Hemoglobin normal.  Anxiety and agoraphobia  Continue Zoloft.  Stable.  Hypokalemia and  hypomagnesemia  Potassium 3.0 on 12/2, replaced.  Magnesium 1.8.  Thrombocytopenia  Suspect lab error, had resolved on repeat testing  Body mass index is 37.46 kg/m./Obesity  Nutritional Status Nutrition Problem: Inadequate oral intake Etiology: poor appetite Signs/Symptoms: per patient/family report Interventions: Prostat, Boost Breeze  DVT prophylaxis: Lovenox Code Status: DNR Family Communication: None at bedside. Discussed with patient's sister from [place] in detail on 12/2, updated care and answered questions. Disposition: Awaiting SNF, difficult placement.  I discussed with TOC team (CM/social worker) on 12/3, no new developments.  If patient gets to a point of stability for DC from hospital, she will be going home with her mother.   Consultants:  Neurology Psychiatry  Procedures:   Antimicrobials:  None   Subjective: Patient reports that she was up in the chair with therapies assistance for an hour yesterday.  Insists on doing an hour only and no more.  Encouraged to gradually increase her activity.  Objective:  Vitals:   12/27/18 0341 12/27/18 0500 12/27/18 0929 12/27/18 1140  BP: 126/63  (!) 114/55 131/60  Pulse: 73  66 73  Resp: 15  15 15   Temp: 98.5 F (36.9 C)  99 F (37.2 C) 98.6 F (37 C)  TempSrc: Oral  Oral Oral  SpO2: 96%  97% 98%  Weight:  99 kg    Height:        Examination:  General exam: Middle-aged female, moderately built and obese lying comfortably propped up in bed.  Have seen her laying in bed almost visits. Respiratory system: Clear to auscultation.  No  increased work of breathing. Cardiovascular system: S1 & S2 heard, RRR. No JVD, murmurs, rubs, gallops or clicks. No pedal edema. Gastrointestinal system: Abdomen is nondistended, soft and nontender. No organomegaly or masses felt. Normal bowel sounds heard. Central nervous system: Alert and oriented. No focal neurological deficits. Extremities: Symmetric 5 x 5 power. Skin:  No rashes, lesions or ulcers Psychiatry: Judgement and insight appear impaired. Mood & affect flat.    Data Reviewed: I have personally reviewed following labs and imaging studies   CBC: Recent Labs  Lab 12/26/18 0724  WBC 12.3*  HGB 13.3  HCT 39.2  MCV 91.4  PLT [id number]    Basic Metabolic Panel: Recent Labs  Lab 12/26/18 0724 12/26/18 1755 12/27/18 0737  NA 141  --  139  K 3.0*  --  3.8  CL 101  --  104  CO2 26  --  24  GLUCOSE 188*  --  175*  BUN 17  --  18  CREATININE 0.56  --  0.63  CALCIUM 9.4  --  9.3  MG  --  1.8  --     Liver Function Tests: No results for input(s): AST, ALT, ALKPHOS, BILITOT, PROT, ALBUMIN in the last 168 hours.  CBG: Recent Labs  Lab 12/26/18 0556 12/26/18 1217 12/26/18 1621 12/26/18 2103 12/27/18 0614  GLUCAP 179* 200* 191* 181* 172*    No results found for this or any previous visit (from the past 240 hour(s)).    Radiology Studies: No results found.        Scheduled Meds: . amLODipine  10 mg Oral Daily  . atorvastatin  80 mg Oral q1800  . carvedilol  6.25 mg Oral BID WC  . clopidogrel  75 mg Oral Daily  . enoxaparin (LOVENOX) injection  40 mg Subcutaneous Q24H  . feeding supplement  1 Container Oral BID BM  . feeding supplement (PRO-STAT SUGAR FREE 64)  30 mL Oral TID  . insulin aspart  0-15 Units Subcutaneous TID WC  . insulin aspart  0-5 Units Subcutaneous QHS  . insulin aspart  4 Units Subcutaneous TID WC  . insulin glargine  10 Units Subcutaneous QHS  . linagliptin  5 mg Oral Daily  . lisinopril  40 mg Oral Daily  . pantoprazole  40 mg Oral Daily  . sertraline  50 mg Oral Daily  . sodium chloride flush  10-40 mL Intracatheter Q12H  . thiamine  100 mg Oral Daily   Continuous Infusions:   LOS: 44 days     Vernell Leep, MD, [place], [facility]. [facility]  To contact the attending provider between 7A-7P or the covering provider during after hours 7P-7A, please log into the web site [email or web address] and  access using universal [place] password for that web site. If you do not have the password, please call the hospital operator.  12/27/2018, 12:01 PM

## 2018-12-27 NOTE — TOC Progression Note (Signed)
Transition of Care [facility]) - Progression Note    Patient Details  Name: Becky Stout MRN: [id number] Date of Birth: 11-30-1955  Transition of Care [facility]) CM/SW Contact  Pollie Friar, RN Phone Number: 12/27/2018, 3:37 PM  Clinical Narrative:    CM spoke to patients 2 sisters about her financials. Pt has a 401K that is preventing her from getting approved for Medicaid. Sisters are working on getting this cashed out to be able to pay off some of pts debts and potentially cover some rehab for the patient. Lorrie to update CM once they have a plan, which should be a couple of days.  TOC following.   Expected Discharge Plan: [place] Barriers to Discharge: Continued Medical Work up, Inadequate or no insurance  Expected Discharge Plan and Services Expected Discharge Plan: [place] In-house Referral: Clinical Social Work Discharge Planning Services: CM Consult Post Acute Care Choice: [place] arrangements for the past 2 months: Single Family Home                                       Social Determinants of Health (SDOH) Interventions    Readmission Risk Interventions No flowsheet data found.

## 2018-12-28 LAB — GLUCOSE, CAPILLARY
Glucose-Capillary: 184 mg/dL — ABNORMAL HIGH (ref 70–99)
Glucose-Capillary: 200 mg/dL — ABNORMAL HIGH (ref 70–99)
Glucose-Capillary: 163 mg/dL — ABNORMAL HIGH (ref 70–99)
Glucose-Capillary: 139 mg/dL — ABNORMAL HIGH (ref 70–99)

## 2018-12-28 NOTE — Progress Notes (Signed)
PROGRESS NOTE   Becky Stout  [id number]    DOB: [birth year]    DOA: 11/03/2018  PCP: Abner Greenspan, MD   I have briefly reviewed patients previous medical records in [facility].  Chief Complaint  Patient presents with  . Nausea  . Emesis  . Altered Mental Status    Brief Narrative:  63 year old female with PMH of DM 2, HTN, WPW syndrome, frequent falls, confusion, and weight loss was admitted for DKA, acute CVA and metabolic encephalopathy.  Patient has been medically stable for discharge for several weeks but ongoing prolonged hospital stay due to difficulty in SNF placement.   Assessment & Plan:   Principal Problem:   Failure to thrive in adult Active Problems:   Hypothyroidism   B12 deficiency   WOLFF (WOLFE)-PARKINSON-WHITE (WPW) SYNDROME   Essential hypertension   Hyperlipidemia   Poorly controlled type 2 diabetes mellitus (HCC)   Anxiety   Acute lower UTI   High anion gap metabolic acidosis   DKA (diabetic ketoacidoses) (HCC)   Cerebral thrombosis with cerebral infarction   DKA in type II DM, not at goal.  DKA resolved.  A1c 7.9.  Currently on Lantus 10 units at bedtime, NovoLog 4 units 3 times daily with meals, SSI and Tradjenta.  Mildly uncontrolled at times but otherwise reasonable.  Continue current management.  No changes in regimen.  Essential hypertension  Controlled on carvedilol, amlodipine, lisinopril.  Well-controlled on current regimen  WPW history  No active issues   CVA  Neurology signed off 10/11  Continue statins, Plavix, modify secondary risk factors such as DM/HTN control  Awaiting SNF for rehab, difficult placement  Patient not cooperative/refusing to consistently work with therapies of mobilize.  All care team to continue to encourage patient to mobilize.  Normocytic anemia/B12 and iron deficiency, menorrhagia  Hemoglobin normal.  Anxiety and agoraphobia  Continue Zoloft.  Stable.  Hypokalemia and  hypomagnesemia  Potassium 3.0 on 12/2, replaced.  Magnesium 1.8.  Thrombocytopenia  Suspect lab error, had resolved on repeat testing  Body mass index is 37.46 kg/m./Obesity  Nutritional Status Nutrition Problem: Inadequate oral intake Etiology: poor appetite Signs/Symptoms: per patient/family report Interventions: Prostat, Boost Breeze   Debility/adult failure to thrive  Secondary to recent CVA, prolonged hospitalization and normal ambulatory status complicated by behavioral health issues i.e. anxiety.  Discussed extensively with physical therapy.  Patient noted this morning soiled in her feces.  Eventually took a couple of steps from bed to recliner, placed in reclining chair and finally sitting in a reclining chair in front of her room door in the hallway.  Continue to encourage mobilization.  DVT prophylaxis: Lovenox Code Status: DNR Family Communication: None at bedside. Discussed with patient's sister from [place] in detail on 12/2, updated care and answered questions. Disposition: Awaiting SNF, difficult placement.  I discussed with TOC team (CM/social worker) on 12/3, no new developments.  If patient gets to a point of stability for DC from hospital, she will be going home with her mother.   Consultants:  Neurology Psychiatry  Procedures:   Antimicrobials:  None   Subjective: "When can I go home".  Denies complaints.  As per nursing, no acute issues noted.  Objective:  Vitals:   12/27/18 1939 12/27/18 2329 12/28/18 0355 12/28/18 0802  BP: (!) 120/53 (!) 127/59 136/63 (!) 128/54  Pulse: 66 67 71 70  Resp: 16 18 18 17   Temp: 97.9 F (36.6 C) 98.4 F (36.9 C) 98.3 F (36.8 C) 98  F (36.7 C)  TempSrc: Oral Oral Oral Oral  SpO2: 96% 100% 98% 100%  Weight:      Height:        Examination:  General exam: Middle-aged female, moderately built and obese seen sitting up in reclining chair comfortably in the hallway just in front of her room.  Does not  appear in any distress. Respiratory system: Clear to auscultation.  No increased work of breathing. Cardiovascular system: S1 & S2 heard, RRR. No JVD, murmurs, rubs, gallops or clicks. No pedal edema. Gastrointestinal system: Abdomen is nondistended, soft and nontender. No organomegaly or masses felt. Normal bowel sounds heard. Central nervous system: Alert and oriented. No focal neurological deficits. Extremities: Symmetric 5 x 5 power. Skin: No rashes, lesions or ulcers Psychiatry: Judgement and insight appear impaired. Mood & affect flat.    Data Reviewed: I have personally reviewed following labs and imaging studies   CBC: Recent Labs  Lab 12/26/18 0724  WBC 12.3*  HGB 13.3  HCT 39.2  MCV 91.4  PLT [id number]    Basic Metabolic Panel: Recent Labs  Lab 12/26/18 0724 12/26/18 1755 12/27/18 0737  NA 141  --  139  K 3.0*  --  3.8  CL 101  --  104  CO2 26  --  24  GLUCOSE 188*  --  175*  BUN 17  --  18  CREATININE 0.56  --  0.63  CALCIUM 9.4  --  9.3  MG  --  1.8  --     Liver Function Tests: No results for input(s): AST, ALT, ALKPHOS, BILITOT, PROT, ALBUMIN in the last 168 hours.  CBG: Recent Labs  Lab 12/27/18 0614 12/27/18 1143 12/27/18 1539 12/27/18 2119 12/28/18 0603  GLUCAP 172* 219* 131* 239* 184*    No results found for this or any previous visit (from the past 240 hour(s)).    Radiology Studies: No results found.        Scheduled Meds: . amLODipine  10 mg Oral Daily  . atorvastatin  80 mg Oral q1800  . carvedilol  6.25 mg Oral BID WC  . clopidogrel  75 mg Oral Daily  . enoxaparin (LOVENOX) injection  40 mg Subcutaneous Q24H  . feeding supplement  1 Container Oral BID BM  . feeding supplement (PRO-STAT SUGAR FREE 64)  30 mL Oral TID  . insulin aspart  0-15 Units Subcutaneous TID WC  . insulin aspart  0-5 Units Subcutaneous QHS  . insulin aspart  4 Units Subcutaneous TID WC  . insulin glargine  10 Units Subcutaneous QHS  . linagliptin  5  mg Oral Daily  . lisinopril  40 mg Oral Daily  . pantoprazole  40 mg Oral Daily  . sertraline  50 mg Oral Daily  . sodium chloride flush  10-40 mL Intracatheter Q12H  . thiamine  100 mg Oral Daily   Continuous Infusions:   LOS: 58 days     Vernell Leep, MD, [place], [facility]. [facility]  To contact the attending provider between 7A-7P or the covering provider during after hours 7P-7A, please log into the web site [email or web address] and access using universal [place] password for that web site. If you do not have the password, please call the hospital operator.  12/28/2018, 9:43 AM

## 2018-12-28 NOTE — Progress Notes (Signed)
Physical Therapy Treatment Patient Details Name: Becky Stout MRN: [id number] DOB: 07-13-1955 Today's Date: 12/28/2018    History of Present Illness 63 y.o. female with Past medical history of B12 deficiency, type II DM not on therapy, HTN, IDA, obesity, WPW syndrome.Pt dx with acute CVA, MRI reveals multiple cerebral and brainstem lacunar infarcts, acute metabolic encepholopathy, Hypertensive emergency, DKA, agorophobia, N/V, hypokalemia, and FTT.      PT Comments    Pt supine in bed lying in fecal matter.  Encouraged patient to mobilize in bed for clean up and to mobilize OOB to progress therapy.  She is very nervous when her bed is moved and when the rails are placed down.  She always likes the door shut and will not get up out of bed unless the door is shut.  She perseverated on needing the door shut.  Pt continues to present with poor strength.  She refused to ambulate to door and only took steps from bed to recliner.  Once in recliner performed LE strengthening with constant redirection.  Pt continues to benefit from snf placement based on physical impairment.  Spoke with MD about possible psych consult.  Based on her fear of leaving the room she may do better in her home environment.  However, patient reports her sister has gotten rid of her current home.  Will continue to follow.     Follow Up Recommendations  SNF     Equipment Recommendations  Wheelchair (measurements PT);Wheelchair cushion (measurements PT);3in1 (PT);Rolling walker with 5" wheels    Recommendations for Other Services       Precautions / Restrictions Precautions Precautions: Fall Precaution Comments: extremely anxious, doesn't want to be touched (if at all possible) and fearful of falling, fearful of leaving room Restrictions Weight Bearing Restrictions: No    Mobility  Bed Mobility Overal bed mobility: Needs Assistance Bed Mobility: Supine to Sit Rolling: Supervision         General bed mobility  comments: use of bed rail and increased time, pt demonstrated increased effort to progress trunk to upright postion  Transfers Overall transfer level: Needs assistance Equipment used: Rolling walker (2 wheeled) Transfers: Sit to/from [facility] Sit to Stand: Min guard Stand pivot transfers: Min assist       General transfer comment: Pt performed sit to stand from standard seat height with min guard assistance after allowing PTA to elevate bed.  Ambulation/Gait Ambulation/Gait assistance: Min assist Gait Distance (Feet): 4 Feet(steps from bed to recliner chair.) Assistive device: Rolling walker (2 wheeled) Gait Pattern/deviations: Step-to pattern;Shuffle;Trunk flexed;Wide base of support     General Gait Details: Encouraged patient to ambulate to the door and back and she refused and would only take steps from bed to the recliner chair.  She continues to be very fearful and will only work with in her comfort.  She is difficult to progress due to self limiting behavior.   Stairs             Wheelchair Mobility    Modified Rankin (Stroke Patients Only) Modified Rankin (Stroke Patients Only) Pre-Morbid Rankin Score: Moderate disability Modified Rankin: Moderately severe disability     Balance Overall balance assessment: Needs assistance   Sitting balance-Leahy Scale: Fair       Standing balance-Leahy Scale: Poor Standing balance comment: reliant on BUE support on RW  Cognition Arousal/Alertness: Awake/alert Behavior During Therapy: Anxious;Agitated Overall Cognitive Status: No family/caregiver present to determine baseline cognitive functioning Area of Impairment: Following commands;Safety/judgement;Awareness;Problem solving;Attention                 Orientation Level: Situation;Disoriented to Current Attention Level: Focused Memory: Decreased recall of precautions;Decreased short-term  memory Following Commands: Follows one step commands inconsistently Safety/Judgement: Decreased awareness of safety;Decreased awareness of deficits Awareness: Emergent Problem Solving: Slow processing;Decreased initiation;Difficulty sequencing;Requires verbal cues;Requires tactile cues General Comments: Pt remains to self limit due to fear and anxiety.  Pt is slow and guarded.  Pt required max VCs to ensure she is safe and that we will not put her in harms way.  She is very particular about the set up of the room and will not leave her room.      Exercises General Exercises - Lower Extremity Ankle Circles/Pumps: AROM;Both;10 reps;Supine Quad Sets: AROM;Both;10 reps;Supine Heel Slides: AROM;Both;10 reps;Supine Hip ABduction/ADduction: AROM;Both;10 reps;Supine Straight Leg Raises: AAROM;Both;10 reps;Supine    General Comments        Pertinent Vitals/Pain Pain Assessment: No/denies pain Faces Pain Scale: No hurt    Home Living                      Prior Function            PT Goals (current goals can now be found in the care plan section) Acute Rehab PT Goals Patient Stated Goal: to stay in bed Potential to Achieve Goals: Fair Additional Goals Additional Goal #1: Patient to propel w/c x 100' with S. Progress towards PT goals: Progressing toward goals    Frequency    Min 3X/week      PT Plan Current plan remains appropriate    Co-evaluation              AM-PAC PT "6 Clicks" Mobility   Outcome Measure  Help needed turning from your back to your side while in a flat bed without using bedrails?: None Help needed moving from lying on your back to sitting on the side of a flat bed without using bedrails?: None Help needed moving to and from a bed to a chair (including a wheelchair)?: A Atha Help needed standing up from a chair using your arms (e.g., wheelchair or bedside chair)?: A Saulters Help needed to walk in hospital room?: A Kingry Help needed  climbing 3-5 steps with a railing? : A Reaume 6 Click Score: 20    End of Session Equipment Utilized During Treatment: Gait belt Activity Tolerance: Patient limited by fatigue Patient left: in bed;with bed alarm set Nurse Communication: Mobility status PT Visit Diagnosis: Other abnormalities of gait and mobility (R26.89)     Time: [id number] PT Time Calculation (min) (ACUTE ONLY): 25 min  Charges:  $Gait Training: 8-22 mins $Therapeutic Exercise: 8-22 mins                     Erasmo Leventhal , PTA Acute Rehabilitation Services Pager [phone] Office [phone]     Delma Villalva [name] 12/28/2018, 8:48 AM

## 2018-12-29 LAB — GLUCOSE, CAPILLARY
Glucose-Capillary: 155 mg/dL — ABNORMAL HIGH (ref 70–99)
Glucose-Capillary: 228 mg/dL — ABNORMAL HIGH (ref 70–99)
Glucose-Capillary: 108 mg/dL — ABNORMAL HIGH (ref 70–99)
Glucose-Capillary: 140 mg/dL — ABNORMAL HIGH (ref 70–99)

## 2018-12-29 NOTE — Progress Notes (Signed)
PROGRESS NOTE   Becky Stout  [id number]    DOB: 08-Jun-1955    DOA: 11/03/2018  PCP: Abner Greenspan, MD   I have briefly reviewed patients previous medical records in [facility].  Chief Complaint  Patient presents with  . Nausea  . Emesis  . Altered Mental Status    Brief Narrative:  63 year old female with PMH of DM 2, HTN, WPW syndrome, frequent falls, confusion, and weight loss was admitted for DKA, acute CVA and metabolic encephalopathy.  Patient has been medically stable for discharge for several weeks but ongoing prolonged hospital stay due to difficulty in SNF placement.   Assessment & Plan:   Principal Problem:   Failure to thrive in adult Active Problems:   Hypothyroidism   B12 deficiency   WOLFF (WOLFE)-PARKINSON-WHITE (WPW) SYNDROME   Essential hypertension   Hyperlipidemia   Poorly controlled type 2 diabetes mellitus (HCC)   Anxiety   Acute lower UTI   High anion gap metabolic acidosis   DKA (diabetic ketoacidoses) (HCC)   Cerebral thrombosis with cerebral infarction   DKA in type II DM, not at goal.  DKA resolved.  A1c 7.9.  Currently on Lantus 10 units at bedtime, NovoLog 4 units 3 times daily with meals, SSI and Tradjenta.  Mildly uncontrolled at times but otherwise reasonable.  Continue current management.  No change in regimen.  Essential hypertension  Controlled on carvedilol, amlodipine, lisinopril.  Well-controlled on current regimen  WPW history  No active issues   CVA  Neurology signed off 10/11  Continue statins, Plavix, modify secondary risk factors such as DM/HTN control  Awaiting SNF for rehab, difficult placement  Patient not cooperative/refusing to consistently work with therapies of mobilize.  All care team to continue to encourage patient to mobilize.  Normocytic anemia/B12 and iron deficiency, menorrhagia  Hemoglobin normal.  Anxiety and agoraphobia  Continue Zoloft.  Stable.  Hypokalemia and  hypomagnesemia  Potassium 3.0 on 12/2, replaced.  Magnesium 1.8.  Thrombocytopenia  Suspect lab error, had resolved on repeat testing  Body mass index is 37.43 kg/m./Obesity  Nutritional Status Nutrition Problem: Inadequate oral intake Etiology: poor appetite Signs/Symptoms: per patient/family report Interventions: Prostat, Boost Breeze   Debility/adult failure to thrive  Secondary to recent CVA, prolonged hospitalization and normal ambulatory status complicated by behavioral health issues i.e. anxiety.  Discussed extensively with physical therapy.  Patient noted this morning soiled in her feces.  Eventually took a couple of steps from bed to recliner, placed in reclining chair and finally sitting in a reclining chair in front of her room door in the hallway.  Continue to encourage mobilization.  DVT prophylaxis: Lovenox Code Status: DNR Family Communication: None at bedside. Discussed with patient's sister from [place] in detail on 12/2, updated care and answered questions. Disposition: Awaiting SNF, difficult placement.  I discussed with TOC team (CM/social worker) on 12/3, no new developments.  If patient gets to a point of stability for DC from hospital, she will be going home with her mother.   Consultants:  Neurology Psychiatry  Procedures:   Antimicrobials:  None   Subjective: Keeps asking when she can go home.  Advised her that she can go home when she is able to safely ambulate with some supervision or minimal assistance so that her mother can assist her at home.  Does not seem to comprehend well.  Objective:  Vitals:   12/29/18 0400 12/29/18 0500 12/29/18 0818 12/29/18 1200  BP: (!) 112/56  (!) 130/55 133/62  Pulse: 76  73 73  Resp: 18  12 16   Temp: 98.4 F (36.9 C)  98.3 F (36.8 C) 97.9 F (36.6 C)  TempSrc: Oral  Oral Oral  SpO2: 98%  99% 99%  Weight:  98.9 kg    Height:        Examination: No change in physical exam over the last several  days. General exam: Middle-aged female, moderately built and obese seen sitting up in reclining chair comfortably in the hallway just in front of her room.  Does not appear in any distress. Respiratory system: Clear to auscultation.  No increased work of breathing. Cardiovascular system: S1 & S2 heard, RRR. No JVD, murmurs, rubs, gallops or clicks. No pedal edema. Gastrointestinal system: Abdomen is nondistended, soft and nontender. No organomegaly or masses felt. Normal bowel sounds heard. Central nervous system: Alert and oriented. No focal neurological deficits. Extremities: Symmetric 5 x 5 power. Skin: No rashes, lesions or ulcers Psychiatry: Judgement and insight appear impaired. Mood & affect flat.    Data Reviewed: I have personally reviewed following labs and imaging studies   CBC: Recent Labs  Lab 12/26/18 0724  WBC 12.3*  HGB 13.3  HCT 39.2  MCV 91.4  PLT [id number]    Basic Metabolic Panel: Recent Labs  Lab 12/26/18 0724 12/26/18 1755 12/27/18 0737  NA 141  --  139  K 3.0*  --  3.8  CL 101  --  104  CO2 26  --  24  GLUCOSE 188*  --  175*  BUN 17  --  18  CREATININE 0.56  --  0.63  CALCIUM 9.4  --  9.3  MG  --  1.8  --     Liver Function Tests: No results for input(s): AST, ALT, ALKPHOS, BILITOT, PROT, ALBUMIN in the last 168 hours.  CBG: Recent Labs  Lab 12/28/18 1153 12/28/18 1541 12/28/18 2134 12/29/18 0638 12/29/18 1153  GLUCAP 200* 163* 139* 155* 228*    No results found for this or any previous visit (from the past 240 hour(s)).    Radiology Studies: No results found.        Scheduled Meds: . amLODipine  10 mg Oral Daily  . atorvastatin  80 mg Oral q1800  . carvedilol  6.25 mg Oral BID WC  . clopidogrel  75 mg Oral Daily  . enoxaparin (LOVENOX) injection  40 mg Subcutaneous Q24H  . feeding supplement  1 Container Oral BID BM  . feeding supplement (PRO-STAT SUGAR FREE 64)  30 mL Oral TID  . insulin aspart  0-15 Units Subcutaneous  TID WC  . insulin aspart  0-5 Units Subcutaneous QHS  . insulin aspart  4 Units Subcutaneous TID WC  . insulin glargine  10 Units Subcutaneous QHS  . linagliptin  5 mg Oral Daily  . lisinopril  40 mg Oral Daily  . pantoprazole  40 mg Oral Daily  . sertraline  50 mg Oral Daily  . sodium chloride flush  10-40 mL Intracatheter Q12H  . thiamine  100 mg Oral Daily   Continuous Infusions:   LOS: 25 days     Vernell Leep, MD, [place], [facility]. [facility]  To contact the attending provider between 7A-7P or the covering provider during after hours 7P-7A, please log into the web site [email or web address] and access using universal [place] password for that web site. If you do not have the password, please call the hospital operator.  12/29/2018, 12:47 PM

## 2018-12-29 NOTE — Plan of Care (Signed)
  Problem: Activity: Goal: Risk for activity intolerance will decrease Outcome: Progressing   

## 2018-12-30 LAB — GLUCOSE, CAPILLARY
Glucose-Capillary: 151 mg/dL — ABNORMAL HIGH (ref 70–99)
Glucose-Capillary: 196 mg/dL — ABNORMAL HIGH (ref 70–99)
Glucose-Capillary: 82 mg/dL (ref 70–99)
Glucose-Capillary: 173 mg/dL — ABNORMAL HIGH (ref 70–99)

## 2018-12-30 NOTE — TOC Progression Note (Signed)
Transition of Care [facility]) - Progression Note    Patient Details  Name: Becky Stout MRN: [id number] Date of Birth: [birth year]  Transition of Care [facility]) CM/SW [place], [place] Phone Number: 12/30/2018, 11:01 AM  Clinical Narrative:     Patient currently does not have any bed offers and remains difficult to place. CSW will continue to follow and assist with further TOC needs.     Expected Discharge Plan: [place] Barriers to Discharge: Continued Medical Work up, Inadequate or no insurance  Expected Discharge Plan and Services Expected Discharge Plan: [place] In-house Referral: Clinical Social Work Discharge Planning Services: CM Consult Post Acute Care Choice: [place] arrangements for the past 2 months: Single Family Home                                       Social Determinants of Health (SDOH) Interventions    Readmission Risk Interventions No flowsheet data found.

## 2018-12-30 NOTE — Progress Notes (Signed)
PROGRESS NOTE   Becky Stout  [id number]    DOB: 02-12-1955    DOA: 11/03/2018  PCP: Abner Greenspan, MD   I have briefly reviewed patients previous medical records in [facility].  Chief Complaint  Patient presents with  . Nausea  . Emesis  . Altered Mental Status    Brief Narrative:  63 year old female with PMH of DM 2, HTN, WPW syndrome, frequent falls, confusion, and weight loss was admitted for DKA, acute CVA and metabolic encephalopathy.  Patient has been medically stable for discharge for several weeks but ongoing prolonged hospital stay due to difficulty in SNF placement.   Assessment & Plan:   Principal Problem:   Failure to thrive in adult Active Problems:   Hypothyroidism   B12 deficiency   WOLFF (WOLFE)-PARKINSON-WHITE (WPW) SYNDROME   Essential hypertension   Hyperlipidemia   Poorly controlled type 2 diabetes mellitus (HCC)   Anxiety   Acute lower UTI   High anion gap metabolic acidosis   DKA (diabetic ketoacidoses) (HCC)   Cerebral thrombosis with cerebral infarction  No current active issues.  Patient has remained stable for a couple of weeks now.  Has been waiting for SNF but remains difficult to place as per social work follow-up today.  Also complicated by patient's reluctance to participate and progress with therapies in the hospital.  DKA in type II DM, not at goal.  DKA resolved.  A1c 7.9.  Currently on Lantus 10 units at bedtime, NovoLog 4 units 3 times daily with meals, SSI and Tradjenta.  Mildly uncontrolled at times but otherwise reasonable.  Continue current management.  No change in regimen.  Essential hypertension  Controlled on carvedilol, amlodipine, lisinopril.  Well-controlled on current regimen  WPW history  No active issues   CVA  Neurology signed off 10/11  Continue statins, Plavix, modify secondary risk factors such as DM/HTN control  Awaiting SNF for rehab, difficult placement  Patient not  cooperative/refusing to consistently work with therapies of mobilize.  All care team to continue to encourage patient to mobilize.  Normocytic anemia/B12 and iron deficiency, menorrhagia  Hemoglobin normal.  Anxiety and agoraphobia  Continue Zoloft.  Stable.  Hypokalemia and hypomagnesemia  Potassium 3.0 on 12/2, replaced.  Magnesium 1.8.  Thrombocytopenia  Suspect lab error, had resolved on repeat testing  Body mass index is 37.46 kg/m./Obesity  Nutritional Status Nutrition Problem: Inadequate oral intake Etiology: poor appetite Signs/Symptoms: per patient/family report Interventions: Prostat, Boost Breeze   Debility/adult failure to thrive  Secondary to recent CVA, prolonged hospitalization and normal ambulatory status complicated by behavioral health issues i.e. anxiety.  Discussed extensively with physical therapy.  Patient noted this morning soiled in her feces.  Eventually took a couple of steps from bed to recliner, placed in reclining chair and finally sitting in a reclining chair in front of her room door in the hallway.  Continue to encourage mobilization.  DVT prophylaxis: Lovenox Code Status: DNR Family Communication: None at bedside. Discussed with patient's sister from [place] in detail on 12/2, updated care and answered questions. Disposition: Awaiting SNF, difficult placement.  I discussed with TOC team (CM/social worker) on 12/3, no new developments.  If patient gets to a point of stability for DC from hospital, she will be going home with her mother.   Consultants:  Neurology Psychiatry  Procedures:   Antimicrobials:  None   Subjective: No new complaints.  As per RN, no acute issues noted.  Objective:  Vitals:   12/30/18 0400  12/30/18 0454 12/30/18 0743 12/30/18 1113  BP: 132/70  132/64 129/62  Pulse: 70  70 67  Resp: 20  16 16   Temp: 98 F (36.7 C)  98.9 F (37.2 C) 98.7 F (37.1 C)  TempSrc: Oral  Oral Oral  SpO2: 100%  98% 98%   Weight:  99 kg    Height:        Examination:  Has remained stable without new exam findings. General exam: Middle-aged female, moderately built and obese seen sitting up in reclining chair comfortably in the hallway just in front of her room.  Does not appear in any distress. Respiratory system: Clear to auscultation.  No increased work of breathing. Cardiovascular system: S1 & S2 heard, RRR. No JVD, murmurs, rubs, gallops or clicks. No pedal edema. Gastrointestinal system: Abdomen is nondistended, soft and nontender. No organomegaly or masses felt. Normal bowel sounds heard. Central nervous system: Alert and oriented. No focal neurological deficits. Extremities: Symmetric 5 x 5 power. Skin: No rashes, lesions or ulcers Psychiatry: Judgement and insight appear impaired. Mood & affect flat.    Data Reviewed: I have personally reviewed following labs and imaging studies   CBC: Recent Labs  Lab 12/26/18 0724  WBC 12.3*  HGB 13.3  HCT 39.2  MCV 91.4  PLT [id number]    Basic Metabolic Panel: Recent Labs  Lab 12/26/18 0724 12/26/18 1755 12/27/18 0737  NA 141  --  139  K 3.0*  --  3.8  CL 101  --  104  CO2 26  --  24  GLUCOSE 188*  --  175*  BUN 17  --  18  CREATININE 0.56  --  0.63  CALCIUM 9.4  --  9.3  MG  --  1.8  --     Liver Function Tests: No results for input(s): AST, ALT, ALKPHOS, BILITOT, PROT, ALBUMIN in the last 168 hours.  CBG: Recent Labs  Lab 12/29/18 1153 12/29/18 1627 12/29/18 2119 12/30/18 0629 12/30/18 1114  GLUCAP 228* 108* 140* 151* 196*    No results found for this or any previous visit (from the past 240 hour(s)).    Radiology Studies: No results found.        Scheduled Meds: . amLODipine  10 mg Oral Daily  . atorvastatin  80 mg Oral q1800  . carvedilol  6.25 mg Oral BID WC  . clopidogrel  75 mg Oral Daily  . enoxaparin (LOVENOX) injection  40 mg Subcutaneous Q24H  . feeding supplement  1 Container Oral BID BM  . feeding  supplement (PRO-STAT SUGAR FREE 64)  30 mL Oral TID  . insulin aspart  0-15 Units Subcutaneous TID WC  . insulin aspart  0-5 Units Subcutaneous QHS  . insulin aspart  4 Units Subcutaneous TID WC  . insulin glargine  10 Units Subcutaneous QHS  . linagliptin  5 mg Oral Daily  . lisinopril  40 mg Oral Daily  . pantoprazole  40 mg Oral Daily  . sertraline  50 mg Oral Daily  . sodium chloride flush  10-40 mL Intracatheter Q12H  . thiamine  100 mg Oral Daily   Continuous Infusions:   LOS: 64 days     Vernell Leep, MD, [place], [facility]. [facility]  To contact the attending provider between 7A-7P or the covering provider during after hours 7P-7A, please log into the web site [email or web address] and access using universal [place] password for that web site. If you do not have the password, please call the  hospital operator.  12/30/2018, 12:32 PM

## 2018-12-31 LAB — GLUCOSE, CAPILLARY
Glucose-Capillary: 191 mg/dL — ABNORMAL HIGH (ref 70–99)
Glucose-Capillary: 190 mg/dL — ABNORMAL HIGH (ref 70–99)
Glucose-Capillary: 160 mg/dL — ABNORMAL HIGH (ref 70–99)
Glucose-Capillary: 131 mg/dL — ABNORMAL HIGH (ref 70–99)

## 2018-12-31 NOTE — Plan of Care (Signed)
  Problem: Activity: Goal: Risk for activity intolerance will decrease Outcome: Progressing   

## 2018-12-31 NOTE — Progress Notes (Signed)
Physical Therapy Treatment Patient Details Name: Becky Stout MRN: [id number] DOB: Mar 31, 1955 Today's Date: 12/31/2018    History of Present Illness 63 y.o. female with Past medical history of B12 deficiency, type II DM not on therapy, HTN, IDA, obesity, WPW syndrome.Pt dx with acute CVA, MRI reveals multiple cerebral and brainstem lacunar infarcts, acute metabolic encepholopathy, Hypertensive emergency, DKA, agorophobia, N/V, hypokalemia, and FTT.      PT Comments    Pt performed gt training in room just past the counter top of room sink at bank. She is hesitant to reach to the doorway.  She ambulated 16 ft with min guard assistance.  She presents with B LE weakness and remains mildly unsteady.  She is progressing very slowly.  She continues to benefit from snf placement.  She will need to be consistent with participation to improve function to a safe level to return home.     Follow Up Recommendations  SNF     Equipment Recommendations  Wheelchair (measurements PT);Wheelchair cushion (measurements PT);3in1 (PT);Rolling walker with 5" wheels    Recommendations for Other Services       Precautions / Restrictions Precautions Precautions: Fall Precaution Comments: extremely anxious, doesn't want to be touched (if at all possible) and fearful of falling, fearful of leaving room Restrictions Weight Bearing Restrictions: No    Mobility  Bed Mobility Overal bed mobility: Needs Assistance Bed Mobility: Supine to Sit Rolling: Supervision         General bed mobility comments: use of bed rail and increased time, pt demonstrated increased effort to progress trunk to upright postion  Transfers Overall transfer level: Needs assistance Equipment used: Rolling walker (2 wheeled) Transfers: Sit to/from Stand Sit to Stand: Min guard         General transfer comment: Pt performed sit to stand from standard seat height with min guard assistance after allowing PTA to elevate  bed.  Ambulation/Gait Ambulation/Gait assistance: Min guard Gait Distance (Feet): 16 Feet Assistive device: Rolling walker (2 wheeled) Gait Pattern/deviations: Step-to pattern;Shuffle;Trunk flexed;Wide base of support     General Gait Details: Encouraged patient to ambulate to the door and back, she did increase distance but only made it part of the way.  She is very fearful to get near the doorway.  Pt required cues for RW safety as she took her L hand off the walker to hold the couter top for 4 ft of gt training.   Stairs             Wheelchair Mobility    Modified Rankin (Stroke Patients Only) Modified Rankin (Stroke Patients Only) Pre-Morbid Rankin Score: Moderate disability Modified Rankin: Moderately severe disability     Balance Overall balance assessment: Needs assistance Sitting-balance support: Feet supported Sitting balance-Leahy Scale: Good       Standing balance-Leahy Scale: Poor                              Cognition Arousal/Alertness: Awake/alert Behavior During Therapy: Anxious;Agitated Overall Cognitive Status: No family/caregiver present to determine baseline cognitive functioning                       Memory: Decreased recall of precautions;Decreased short-term memory Following Commands: Follows one step commands inconsistently Safety/Judgement: Decreased awareness of safety;Decreased awareness of deficits Awareness: Emergent   General Comments: Pt remains to self limit due to fear and anxiety.  Pt is slow and guarded.  Pt  required max VCs to ensure she is safe and that we will not put her in harms way.  She is very particular about the set up of the room and will not leave her room.      Exercises      General Comments        Pertinent Vitals/Pain Pain Assessment: No/denies pain Faces Pain Scale: No hurt    Home Living                      Prior Function            PT Goals (current goals can now  be found in the care plan section) Acute Rehab PT Goals Patient Stated Goal: to stay in bed Potential to Achieve Goals: Fair Progress towards PT goals: Progressing toward goals    Frequency    Min 3X/week      PT Plan Current plan remains appropriate    Co-evaluation              AM-PAC PT "6 Clicks" Mobility   Outcome Measure  Help needed turning from your back to your side while in a flat bed without using bedrails?: None Help needed moving from lying on your back to sitting on the side of a flat bed without using bedrails?: None Help needed moving to and from a bed to a chair (including a wheelchair)?: A Worthington Help needed standing up from a chair using your arms (e.g., wheelchair or bedside chair)?: A Spargo Help needed to walk in hospital room?: A Mcdonald Help needed climbing 3-5 steps with a railing? : A Marucci 6 Click Score: 20    End of Session Equipment Utilized During Treatment: Gait belt Activity Tolerance: Patient tolerated treatment well(self limiting due to anxiety) Patient left: in chair;with call bell/phone within reach Nurse Communication: Mobility status PT Visit Diagnosis: Other abnormalities of gait and mobility (R26.89)     Time: [id number] PT Time Calculation (min) (ACUTE ONLY): 9 min  Charges:  $Gait Training: 8-22 mins                     Erasmo Leventhal , PTA Acute Rehabilitation Services Pager [phone] Office [phone]     Terriann Difonzo [name] 12/31/2018, 6:59 PM

## 2018-12-31 NOTE — Progress Notes (Signed)
PROGRESS NOTE   Becky Stout  [id number]    DOB: May 09, 1955    DOA: 11/03/2018  PCP: Abner Greenspan, MD   I have briefly reviewed patients previous medical records in [facility].  Chief Complaint  Patient presents with  . Nausea  . Emesis  . Altered Mental Status    Brief Narrative:  63 year old female with PMH of DM 2, HTN, WPW syndrome, frequent falls, confusion, and weight loss was admitted for DKA, acute CVA and metabolic encephalopathy.  Patient has been medically stable for discharge for several weeks but ongoing prolonged hospital stay due to difficulty in SNF placement.   Assessment & Plan:   Principal Problem:   Failure to thrive in adult Active Problems:   Hypothyroidism   B12 deficiency   WOLFF (WOLFE)-PARKINSON-WHITE (WPW) SYNDROME   Essential hypertension   Hyperlipidemia   Poorly controlled type 2 diabetes mellitus (HCC)   Anxiety   Acute lower UTI   High anion gap metabolic acidosis   DKA (diabetic ketoacidoses) (HCC)   Cerebral thrombosis with cerebral infarction  No current active issues.  Patient has remained stable for a couple of weeks now.  Has been waiting for SNF but remains difficult to place as per social work follow-up today.  Also complicated by patient's reluctance to participate and progress with therapies in the hospital.  Keeps regularly asking if she can go home.  However she has not yet demonstrated that she can safely ambulate to an extent where she can return home and be managed by her family.  If she returns home, will be going home with her mother.  DKA in type II DM, not at goal.  DKA resolved.  A1c 7.9.  Currently on Lantus 10 units at bedtime, NovoLog 4 units 3 times daily with meals, SSI and Tradjenta.  Has been on this regimen for a couple of weeks now.  Although intermittently mildly controlled, mostly reasonable.  Continue current regimen.  Essential hypertension  Controlled on carvedilol, amlodipine, lisinopril.   No changes.  WPW history  No active issues   CVA  Neurology signed off 10/11  Continue statins, Plavix, modify secondary risk factors such as DM/HTN control  Awaiting SNF for rehab, difficult placement  Patient not cooperative/refusing to consistently work with therapies of mobilize.  All care team to continue to encourage patient to mobilize.  Normocytic anemia/B12 and iron deficiency, menorrhagia  Hemoglobin normal.  Anxiety and agoraphobia  Continue Zoloft.  Stable.  Hypokalemia and hypomagnesemia  Potassium 3.0 on 12/2, replaced.  Magnesium 1.8.   Body mass index is 37.46 kg/m./Obesity  Nutritional Status Nutrition Problem: Inadequate oral intake Etiology: poor appetite Signs/Symptoms: per patient/family report Interventions: Prostat, Boost Breeze   Debility/adult failure to thrive  Secondary to recent CVA, prolonged hospitalization and normal ambulatory status complicated by behavioral health issues i.e. anxiety.  Discussed extensively with physical therapy.  Patient noted this morning soiled in her feces.  Eventually took a couple of steps from bed to recliner, placed in reclining chair and finally sitting in a reclining chair in front of her room door in the hallway.  Continue to encourage mobilization.  DVT prophylaxis: Lovenox Code Status: DNR Family Communication: None at bedside. Discussed with patient's sister from [place] in detail on 12/2, updated care and answered questions. Disposition: Awaiting SNF, difficult placement.  I discussed with TOC team/CM on 12/7, no new developments.  If patient gets to a point of stability for DC from hospital, she will be going  home with her mother.   Consultants:  Neurology Psychiatry  Procedures:   Antimicrobials:  None   Subjective: Patient does not have good insight into her medical condition or abilities.  Keeps asking to go home although she is barely able to take a couple of steps with assistance  to a chair next to her bed and consistently keeps resisting work with therapies.  Denies any other complaints.  Objective:  Vitals:   12/30/18 2000 12/31/18 0014 12/31/18 0354 12/31/18 0828  BP: 105/67 126/63 128/72 (!) 146/65  Pulse: 66 65 66 75  Resp: 18 20 16 18   Temp: 97.9 F (36.6 C) 98.6 F (37 C) 98.2 F (36.8 C) 97.7 F (36.5 C)  TempSrc: Oral Oral Oral Oral  SpO2: 98% 99% 98% 98%  Weight:   99 kg   Height:        Examination:  General exam: Middle-aged female, moderately built and obese seen sitting up in reclining chair comfortably in the hallway just in front of her room.  Does not appear in any distress. Respiratory system: Clear to auscultation.  No increased work of breathing. Cardiovascular system: S1 & S2 heard, RRR. No JVD, murmurs, rubs, gallops or clicks. No pedal edema. Gastrointestinal system: Abdomen is nondistended, soft and nontender. No organomegaly or masses felt. Normal bowel sounds heard. Central nervous system: Alert and oriented. No focal neurological deficits. Extremities: Symmetric 5 x 5 power. Skin: No rashes, lesions or ulcers Psychiatry: Judgement and insight definitely impaired. Mood & affect flat.    Data Reviewed: I have personally reviewed following labs and imaging studies   CBC: Recent Labs  Lab 12/26/18 0724  WBC 12.3*  HGB 13.3  HCT 39.2  MCV 91.4  PLT [id number]    Basic Metabolic Panel: Recent Labs  Lab 12/26/18 0724 12/26/18 1755 12/27/18 0737  NA 141  --  139  K 3.0*  --  3.8  CL 101  --  104  CO2 26  --  24  GLUCOSE 188*  --  175*  BUN 17  --  18  CREATININE 0.56  --  0.63  CALCIUM 9.4  --  9.3  MG  --  1.8  --     Liver Function Tests: No results for input(s): AST, ALT, ALKPHOS, BILITOT, PROT, ALBUMIN in the last 168 hours.  CBG: Recent Labs  Lab 12/30/18 0629 12/30/18 1114 12/30/18 1557 12/30/18 2054 12/31/18 0633  GLUCAP 151* 196* 82 173* 191*    No results found for this or any previous visit  (from the past 240 hour(s)).    Radiology Studies: No results found.        Scheduled Meds: . amLODipine  10 mg Oral Daily  . atorvastatin  80 mg Oral q1800  . carvedilol  6.25 mg Oral BID WC  . clopidogrel  75 mg Oral Daily  . enoxaparin (LOVENOX) injection  40 mg Subcutaneous Q24H  . feeding supplement  1 Container Oral BID BM  . feeding supplement (PRO-STAT SUGAR FREE 64)  30 mL Oral TID  . insulin aspart  0-15 Units Subcutaneous TID WC  . insulin aspart  0-5 Units Subcutaneous QHS  . insulin aspart  4 Units Subcutaneous TID WC  . insulin glargine  10 Units Subcutaneous QHS  . linagliptin  5 mg Oral Daily  . lisinopril  40 mg Oral Daily  . pantoprazole  40 mg Oral Daily  . sertraline  50 mg Oral Daily  . sodium chloride flush  10-40  mL Intracatheter Q12H  . thiamine  100 mg Oral Daily   Continuous Infusions:   LOS: 51 days     Vernell Leep, MD, [place], [facility]. [facility]  To contact the attending provider between 7A-7P or the covering provider during after hours 7P-7A, please log into the web site [email or web address] and access using universal [place] password for that web site. If you do not have the password, please call the hospital operator.  12/31/2018, 9:16 AM

## 2019-01-01 LAB — GLUCOSE, CAPILLARY
Glucose-Capillary: 162 mg/dL — ABNORMAL HIGH (ref 70–99)
Glucose-Capillary: 182 mg/dL — ABNORMAL HIGH (ref 70–99)
Glucose-Capillary: 96 mg/dL (ref 70–99)
Glucose-Capillary: 151 mg/dL — ABNORMAL HIGH (ref 70–99)

## 2019-01-01 NOTE — Progress Notes (Signed)
PROGRESS NOTE   Becky Stout  [id number]    DOB: October 04, 1955    DOA: 11/03/2018  PCP: Abner Greenspan, MD   I have briefly reviewed patients previous medical records in [facility].  Chief Complaint  Patient presents with  . Nausea  . Emesis  . Altered Mental Status    Brief Narrative:  63 year old female with PMH of DM 2, HTN, WPW syndrome, frequent falls, confusion, and weight loss was admitted for DKA, acute CVA and metabolic encephalopathy.  Patient has been medically stable for discharge for several weeks but ongoing prolonged hospital stay due to difficulty in SNF placement.   Assessment & Plan:   Principal Problem:   Failure to thrive in adult Active Problems:   Hypothyroidism   B12 deficiency   WOLFF (WOLFE)-PARKINSON-WHITE (WPW) SYNDROME   Essential hypertension   Hyperlipidemia   Poorly controlled type 2 diabetes mellitus (HCC)   Anxiety   Acute lower UTI   High anion gap metabolic acidosis   DKA (diabetic ketoacidoses) (HCC)   Cerebral thrombosis with cerebral infarction  No current active issues.  Patient has remained stable for several weeks now.  Has been waiting for SNF but has been difficult to place as per social work.  This is complicated by patient's reluctance/avoidance to participate and progress with therapies in the hospital.  She keeps asking to go home but has barely walked from her bed a couple of steps to the sink next to her bed with assistance.  She has to be able to demonstrate ambulation with safety to a level that is safe to go home with her mother.  This has been explained to her multiple times but unsure how much she comprehends.  She does have capacity to make medical decisions.  DKA in type II DM, not at goal.  DKA resolved.  A1c 7.9.  Currently on Lantus 10 units at bedtime, NovoLog 4 units 3 times daily with meals, SSI and Tradjenta.  Has been on this regimen for a couple of weeks now.  Although intermittently mildly  controlled, mostly reasonable.  Continue current regimen without change.  Essential hypertension  Controlled on carvedilol, amlodipine, lisinopril.  No changes.  WPW history  No active issues   CVA  Neurology signed off 10/11  Continue statins, Plavix, modify secondary risk factors such as DM/HTN control  Awaiting SNF for rehab, difficult placement  Patient not cooperative/refusing to consistently work with therapies of mobilize.  All care team to continue to encourage patient to mobilize.  Normocytic anemia/B12 and iron deficiency, menorrhagia  Hemoglobin normal.  Anxiety and agoraphobia  Continue Zoloft.  Stable.  Hypokalemia and hypomagnesemia  Potassium 3.0 on 12/2, replaced.  Magnesium 1.8.   Body mass index is 36.71 kg/m./Obesity  Nutritional Status Nutrition Problem: Inadequate oral intake Etiology: poor appetite Signs/Symptoms: per patient/family report Interventions: Prostat, Boost Breeze   Debility/adult failure to thrive  Secondary to recent CVA, prolonged hospitalization and normal ambulatory status complicated by behavioral health issues i.e. anxiety.  Encouraged mobilization and advised her that if she can get stronger and be able to ambulate safely then she can be discharged home at that point.  DVT prophylaxis: Lovenox Code Status: DNR Family Communication: None at bedside.  Left a voicemail message for patient's daughter in [place]. Disposition: Awaiting SNF, difficult placement.  I discussed with TOC team/CM on 12/7, no new developments.  If patient gets to a point of stability for DC from hospital, she will be going home with  her mother.   Consultants:  Neurology Psychiatry  Procedures:   Antimicrobials:  None   Subjective: Denies complaints. Again asking when she can go home.  As per RN, no acute issues noted.  Objective:  Vitals:   01/01/19 0400 01/01/19 0500 01/01/19 0828 01/01/19 1221  BP: 114/64  126/61 (!) 122/91   Pulse: 70  72 70  Resp: 18  12 16   Temp: 98.4 F (36.9 C)  97.9 F (36.6 C) 98 F (36.7 C)  TempSrc: Oral  Oral Oral  SpO2: 98%  98% 99%  Weight:  97 kg    Height:        Examination:  General exam: Middle-aged female, moderately built and obese lying comfortably supine in bed. Respiratory system: Clear to auscultation.  No increased work of breathing. Cardiovascular system: S1 & S2 heard, RRR. No JVD, murmurs, rubs, gallops or clicks. No pedal edema. Gastrointestinal system: Abdomen is nondistended, soft and nontender. No organomegaly or masses felt. Normal bowel sounds heard. Central nervous system: Alert and oriented. No focal neurological deficits. Extremities: Symmetric 5 x 5 power. Skin: No rashes, lesions or ulcers Psychiatry: Judgement and insight impaired. Mood & affect flat.    Data Reviewed: I have personally reviewed following labs and imaging studies   CBC: Recent Labs  Lab 12/26/18 0724  WBC 12.3*  HGB 13.3  HCT 39.2  MCV 91.4  PLT [id number]    Basic Metabolic Panel: Recent Labs  Lab 12/26/18 0724 12/26/18 1755 12/27/18 0737  NA 141  --  139  K 3.0*  --  3.8  CL 101  --  104  CO2 26  --  24  GLUCOSE 188*  --  175*  BUN 17  --  18  CREATININE 0.56  --  0.63  CALCIUM 9.4  --  9.3  MG  --  1.8  --     Liver Function Tests: No results for input(s): AST, ALT, ALKPHOS, BILITOT, PROT, ALBUMIN in the last 168 hours.  CBG: Recent Labs  Lab 12/31/18 1209 12/31/18 1640 12/31/18 2056 01/01/19 0552 01/01/19 1158  GLUCAP 190* 160* 131* 162* 182*    No results found for this or any previous visit (from the past 240 hour(s)).    Radiology Studies: No results found.        Scheduled Meds: . amLODipine  10 mg Oral Daily  . atorvastatin  80 mg Oral q1800  . carvedilol  6.25 mg Oral BID WC  . clopidogrel  75 mg Oral Daily  . enoxaparin (LOVENOX) injection  40 mg Subcutaneous Q24H  . feeding supplement  1 Container Oral BID BM  . feeding  supplement (PRO-STAT SUGAR FREE 64)  30 mL Oral TID  . insulin aspart  0-15 Units Subcutaneous TID WC  . insulin aspart  0-5 Units Subcutaneous QHS  . insulin aspart  4 Units Subcutaneous TID WC  . insulin glargine  10 Units Subcutaneous QHS  . linagliptin  5 mg Oral Daily  . lisinopril  40 mg Oral Daily  . pantoprazole  40 mg Oral Daily  . sertraline  50 mg Oral Daily  . sodium chloride flush  10-40 mL Intracatheter Q12H  . thiamine  100 mg Oral Daily   Continuous Infusions:   LOS: 22 days     Vernell Leep, MD, [place], [facility]. [facility]  To contact the attending provider between 7A-7P or the covering provider during after hours 7P-7A, please log into the web site [email or web address] and access using  universal [place] password for that web site. If you do not have the password, please call the hospital operator.  01/01/2019, 2:21 PM
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
## 2019-01-01 NOTE — Plan of Care (Signed)
  Problem: Coping: Goal: Level of anxiety will decrease Outcome: Not Progressing   

## 2019-01-02 DIAGNOSIS — R531 Weakness: Secondary | ICD-10-CM

## 2019-01-02 LAB — GLUCOSE, CAPILLARY
Glucose-Capillary: 156 mg/dL — ABNORMAL HIGH (ref 70–99)
Glucose-Capillary: 209 mg/dL — ABNORMAL HIGH (ref 70–99)
Glucose-Capillary: 155 mg/dL — ABNORMAL HIGH (ref 70–99)
Glucose-Capillary: 165 mg/dL — ABNORMAL HIGH (ref 70–99)

## 2019-01-02 MED ORDER — SERTRALINE HCL 100 MG PO TABS
100.0000 mg | ORAL_TABLET | Freq: Every day | ORAL | Status: DC
  Administered 2019-01-03 – 2019-02-02 (×31): 100 mg via ORAL
  Filled 2019-01-02 (×32): qty 1

## 2019-01-02 NOTE — Progress Notes (Signed)
PROGRESS NOTE    Becky Stout   [id number]  DOB: 10-Sep-1955  DOA: 11/03/2018 PCP: Abner Greenspan, MD   Brief Narrative:  Becky Stout is a 63 year old female with PMH of DM 2, HTN, WPW syndrome, frequent falls, confusion, and weight loss was admitted for DKA, acute CVA and metabolic encephalopathy.  Patient has been medically stable for discharge for several weeks but ongoing prolonged hospital stay due to difficulty in SNF placement.   Subjective: The patient has no complaints.    Assessment & Plan:   Principal Problem:   DKA (diabetic ketoacidoses)- presenting problem -This was partly the initial reason for the patient's hospital admission and has resolved -Patient is currently on Lantus with NovoLog and also Tradjenta -Her hemoglobin A1c was noted to be 7.9  Active Problems:  Acute CVA-presenting problem -MRI on 10/10 revealed scattered small acute infarcts in both cerebral hemispheres and brainstem-there were also chronic basal ganglia infarcts and moderately advanced cerebral atrophy -2D echo was performed on 10/11 which showed normal EF, impaired LV diastolic filling suggestive of diastolic heart failure-no thrombus was noted -Carotid ultrasound and transcranial Dopplers were unremarkable -Continue statins & Plavix as recommended by neurology -Awaiting skilled nursing facility placement  WPW syndrome -No active issues  Essential hypertension Continue carvedilol, amlodipine, lisinopril-continue to follow BP and adjust medications as needed  Anxiety/depression and agoraphobia  -Continue Zoloft which has been started during this hospital stay by psychiatry  Cognitive decline -This is noted during the hospital stay and also confirmed by family  Hypokalemia and hypomagnesemia -Have been adequately replaced  Obesity -Body mass index is 37.46 kg/m.     Time spent in minutes: 35 DVT prophylaxis: Lovenox Code Status: DO NOT RESUSCITATE Family  Communication:  Disposition Plan: -awaiting skilled nursing placement- she is not safe to return to home Consultants:   Neurology  Psychiatry Procedures:   2D echo - 1. Left ventricular ejection fraction, by visual estimation, is 60 to 65%. The left ventricle has normal function. Normal left ventricular size. There is mildly increased left ventricular hypertrophy.  2. Elevated mean left atrial pressure.  3. Left ventricular diastolic Doppler parameters are consistent with impaired relaxation pattern of LV diastolic filling.  4. Global right ventricle has normal systolic function.The right ventricular size is normal. No increase in right ventricular wall thickness.  5. Left atrial size was normal.  6. Right atrial size was normal.  7. The mitral valve is normal in structure. No evidence of mitral valve regurgitation. No evidence of mitral stenosis.  8. The tricuspid valve is normal in structure. Tricuspid valve regurgitation was not visualized by color flow Doppler.  9. The aortic valve is normal in structure. Aortic valve regurgitation was not visualized by color flow Doppler. Structurally normal aortic valve, with no evidence of sclerosis or stenosis. 10. The pulmonic valve was normal in structure. Pulmonic valve regurgitation is mild by color flow Doppler.  11. The inferior vena cava is normal in size with greater than 50% respiratory variability, suggesting right atrial pressure of 3 mmHg. Antimicrobials:  Anti-infectives (From admission, onward)   Start     Dose/Rate Route Frequency Ordered Stop   11/03/18 1930  cefTRIAXone (ROCEPHIN) 1 g in sodium chloride 0.9 % 100 mL IVPB  Status:  Discontinued     1 g 200 mL/hr over 30 Minutes Intravenous Every 24 hours 11/03/18 1724 11/06/18 1118   11/03/18 0530  cefTRIAXone (ROCEPHIN) 1 g in sodium chloride 0.9 % 100 mL IVPB  1 g 200 mL/hr over 30 Minutes Intravenous  Once 11/03/18 0525 11/03/18 0629       Objective: Vitals:    01/01/19 2310 01/02/19 0329 01/02/19 0500 01/02/19 0825  BP: 133/60 130/61  (!) 125/50  Pulse: 65 69  73  Resp: 16 16  20   Temp: 98.4 F (36.9 C) 98.2 F (36.8 C)  98.4 F (36.9 C)  TempSrc: Oral Oral  Oral  SpO2: 100% 98%  99%  Weight:   99 kg   Height:        Intake/Output Summary (Last 24 hours) at 01/02/2019 0951 Last data filed at 01/02/2019 0857 Gross per 24 hour  Intake 250 ml  Output 1175 ml  Net -925 ml   Filed Weights   12/31/18 0354 01/01/19 0500 01/02/19 0500  Weight: 99 kg 97 kg 99 kg    Examination: General exam: Appears comfortable  HEENT: PERRLA, oral mucosa moist, no sclera icterus or thrush Respiratory system: Clear to auscultation. Respiratory effort normal. Cardiovascular system: S1 & S2 heard, RRR.   Gastrointestinal system: Abdomen soft, non-tender, nondistended. Normal bowel sounds. Central nervous system: Alert and oriented. No focal neurological deficits. Extremities: No cyanosis, clubbing or edema Skin: No rashes or ulcers Psychiatry:  Mood & affect appropriate.     Data Reviewed: I have personally reviewed following labs and imaging studies  CBC: No results for input(s): WBC, NEUTROABS, HGB, HCT, MCV, PLT in the last 168 hours. Basic Metabolic Panel: Recent Labs  Lab 12/26/18 1755 12/27/18 0737  NA  --  139  K  --  3.8  CL  --  104  CO2  --  24  GLUCOSE  --  175*  BUN  --  18  CREATININE  --  0.63  CALCIUM  --  9.3  MG 1.8  --    GFR: Estimated Creatinine Clearance: 84.4 mL/min (by C-G formula based on SCr of 0.63 mg/dL). Liver Function Tests: No results for input(s): AST, ALT, ALKPHOS, BILITOT, PROT, ALBUMIN in the last 168 hours. No results for input(s): LIPASE, AMYLASE in the last 168 hours. No results for input(s): AMMONIA in the last 168 hours. Coagulation Profile: No results for input(s): INR, PROTIME in the last 168 hours. Cardiac Enzymes: No results for input(s): CKTOTAL, CKMB, CKMBINDEX, TROPONINI in the last 168  hours. BNP (last 3 results) No results for input(s): PROBNP in the last 8760 hours. HbA1C: No results for input(s): HGBA1C in the last 72 hours. CBG: Recent Labs  Lab 01/01/19 0552 01/01/19 1158 01/01/19 1653 01/01/19 2103 01/02/19 0605  GLUCAP 162* 182* 96 151* 156*   Lipid Profile: No results for input(s): CHOL, HDL, LDLCALC, TRIG, CHOLHDL, LDLDIRECT in the last 72 hours. Thyroid Function Tests: No results for input(s): TSH, T4TOTAL, FREET4, T3FREE, THYROIDAB in the last 72 hours. Anemia Panel: No results for input(s): VITAMINB12, FOLATE, FERRITIN, TIBC, IRON, RETICCTPCT in the last 72 hours. Urine analysis:    Component Value Date/Time   COLORURINE YELLOW 11/03/2018 0341   APPEARANCEUR CLOUDY (A) 11/03/2018 0341   LABSPEC 1.024 11/03/2018 0341   PHURINE 6.0 11/03/2018 0341   GLUCOSEU >=500 (A) 11/03/2018 0341   HGBUR MODERATE (A) 11/03/2018 0341   HGBUR negative 11/01/2008 1121   BILIRUBINUR NEGATIVE 11/03/2018 0341   BILIRUBINUR neg. 12/21/2012 0933   KETONESUR 80 (A) 11/03/2018 0341   PROTEINUR 30 (A) 11/03/2018 0341   UROBILINOGEN 0.2 12/21/2012 0933   UROBILINOGEN 0.2 11/01/2008 1121   NITRITE NEGATIVE 11/03/2018 0341   LEUKOCYTESUR MODERATE (  A) 11/03/2018 0341   Sepsis Labs: @LABRCNTIP (procalcitonin:4,lacticidven:4) )No results found for this or any previous visit (from the past 240 hour(s)).       Radiology Studies: No results found.    Scheduled Meds: . amLODipine  10 mg Oral Daily  . atorvastatin  80 mg Oral q1800  . carvedilol  6.25 mg Oral BID WC  . clopidogrel  75 mg Oral Daily  . enoxaparin (LOVENOX) injection  40 mg Subcutaneous Q24H  . feeding supplement  1 Container Oral BID BM  . feeding supplement (PRO-STAT SUGAR FREE 64)  30 mL Oral TID  . insulin aspart  0-15 Units Subcutaneous TID WC  . insulin aspart  0-5 Units Subcutaneous QHS  . insulin aspart  4 Units Subcutaneous TID WC  . insulin glargine  10 Units Subcutaneous QHS  .  linagliptin  5 mg Oral Daily  . lisinopril  40 mg Oral Daily  . pantoprazole  40 mg Oral Daily  . sertraline  50 mg Oral Daily  . sodium chloride flush  10-40 mL Intracatheter Q12H  . thiamine  100 mg Oral Daily   Continuous Infusions:   LOS: 60 days      Debbe Odea, MD [facility] Pager: [email or web address] Password [email or web address] 01/02/2019, 9:51 AM
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
## 2019-01-02 NOTE — Progress Notes (Signed)
Physical Therapy Treatment Patient Details Name: Becky Stout MRN: [id number] DOB: 03-12-1955 Today's Date: 01/02/2019    History of Present Illness 63 y.o. female with Past medical history of B12 deficiency, type II DM not on therapy, HTN, IDA, obesity, WPW syndrome.Pt dx with acute CVA, MRI reveals multiple cerebral and brainstem lacunar infarcts, acute metabolic encepholopathy, Hypertensive emergency, DKA, agorophobia, N/V, hypokalemia, and FTT.      PT Comments    Pt performed gt training and functional mobility with good ability to follow commands.  She is responding better to PTA now that rapport is established.  Pt is progressing well but remains to require assistance to transfer and ambulate.  Continue to recommend SNF at this time unless 24 hr assistance can be provided at home.  Plan for [facility] mobility next session in hopes that she may be able to d/c home at [facility] level of assistance.     Follow Up Recommendations  SNF     Equipment Recommendations  Wheelchair (measurements PT);Wheelchair cushion (measurements PT);3in1 (PT);Rolling walker with 5" wheels    Recommendations for Other Services       Precautions / Restrictions Precautions Precautions: Fall Precaution Comments: extremely anxious, doesn't want to be touched (if at all possible) and fearful of falling, fearful of leaving room Restrictions Weight Bearing Restrictions: No    Mobility  Bed Mobility Overal bed mobility: Modified Independent Bed Mobility: Sit to Supine Rolling: Supervision     Sit to supine: Modified independent (Device/Increase time)   General bed mobility comments: No assistance needed to return back to bed.  Transfers Overall transfer level: Needs assistance Equipment used: Rolling walker (2 wheeled) Transfers: Sit to/from Stand Sit to Stand: Min assist Stand pivot transfers: Min assist       General transfer comment: Cues for hand placement to push from seated surface.  Cues to back  entirely before sitting.  Pt contiues to present with poor eccentric load.  Ambulation/Gait Ambulation/Gait assistance: Min guard Gait Distance (Feet): 20 Feet Assistive device: Rolling walker (2 wheeled) Gait Pattern/deviations: Step-to pattern;Shuffle;Trunk flexed;Wide base of support     General Gait Details: Pt continues with forward flexion of trunk and wide waddling BOS.  Pt required cues to step closer to device and to keep B feet inside the RW.  Chair pushed in hallway and patient ambulated from hallway back to bed with good compliance.   Stairs             Wheelchair Mobility    Modified Rankin (Stroke Patients Only) Modified Rankin (Stroke Patients Only) Pre-Morbid Rankin Score: Moderate disability Modified Rankin: Moderately severe disability     Balance Overall balance assessment: Needs assistance Sitting-balance support: Feet supported Sitting balance-Leahy Scale: Good Sitting balance - Comments: minguard for sitting balance   Standing balance support: Bilateral upper extremity supported Standing balance-Leahy Scale: Poor Standing balance comment: reliant on BUE support on RW                            Cognition Arousal/Alertness: Awake/alert Behavior During Therapy: Anxious;WFL for tasks assessed/performed Overall Cognitive Status: No family/caregiver present to determine baseline cognitive functioning Area of Impairment: Following commands;Safety/judgement;Awareness;Problem solving;Attention                       Following Commands: Follows one step commands inconsistently Safety/Judgement: Decreased awareness of safety;Decreased awareness of deficits Awareness: Emergent Problem Solving: Slow processing;Decreased initiation;Difficulty sequencing;Requires verbal cues;Requires tactile cues  General Comments: Pt was very pleasant and compliant during today's session.  She tolerated increased gt and was very respectful and grateful to  PTA during session.      Exercises      General Comments        Pertinent Vitals/Pain Pain Assessment: No/denies pain    Home Living                      Prior Function            PT Goals (current goals can now be found in the care plan section) Acute Rehab PT Goals Patient Stated Goal: To walk back to bed. Potential to Achieve Goals: Fair Additional Goals Additional Goal #1: Patient to propel w/c x 100' with S. Progress towards PT goals: Progressing toward goals    Frequency    Min 3X/week      PT Plan Current plan remains appropriate    Co-evaluation              AM-PAC PT "6 Clicks" Mobility   Outcome Measure  Help needed turning from your back to your side while in a flat bed without using bedrails?: None Help needed moving from lying on your back to sitting on the side of a flat bed without using bedrails?: None Help needed moving to and from a bed to a chair (including a wheelchair)?: A [place] Help needed standing up from a chair using your arms (e.g., wheelchair or bedside chair)?: A Brideau Help needed to walk in hospital room?: A Raptis Help needed climbing 3-5 steps with a railing? : A [place] 6 Click Score: 20    End of Session Equipment Utilized During Treatment: Gait belt Activity Tolerance: Patient tolerated treatment well Patient left: in bed;with call bell/phone within reach;with bed alarm set Nurse Communication: Mobility status PT Visit Diagnosis: Other abnormalities of gait and mobility (R26.89)     Time: [id number] PT Time Calculation (min) (ACUTE ONLY): 11 min  Charges:  $Gait Training: 8-22 mins                     Erasmo Leventhal , PTA Acute Rehabilitation Services Pager [phone] Office [phone]     Jynesis Nakamura [name] 01/02/2019, 2:16 PM

## 2019-01-02 NOTE — TOC Progression Note (Signed)
Transition of Care [facility]) - Progression Note    Patient Details  Name: Becky Stout MRN: [id number] Date of Birth: 1955-12-22  Transition of Care [facility]) CM/SW [place], [place] Phone Number: 01/02/2019, 5:29 PM  Clinical Narrative:   CSW spoke with patient's sister, Keane Police, about update on patient's 401K. Per [employer] Police, patient was able to call and explain the need to cash out her 401K. They have not yet received the money, but are working on making that happen. Lorrie asked about who she needs to contact about what exactly counts for the spend down. CSW contacted Jenny Reichmann with Financial Counseling to ask about who the patient would need to reach out to for rules with the spend down to still qualify for Medicaid. Jenny Reichmann looked into the patient's information and called CSW back, indicating that they can't find a Medicaid application on file for the patient. CSW to reach out to financial counselor Charlena Cross who should have worked on the patient's Medicaid application to ask about what happened. CSW to follow.    Expected Discharge Plan: [place] Barriers to Discharge: Continued Medical Work up, Inadequate or no insurance  Expected Discharge Plan and Services Expected Discharge Plan: [place] In-house Referral: Clinical Social Work Discharge Planning Services: CM Consult Post Acute Care Choice: [place] arrangements for the past 2 months: Single Family Home                                       Social Determinants of Health (SDOH) Interventions    Readmission Risk Interventions No flowsheet data found.

## 2019-01-02 NOTE — Progress Notes (Signed)
Occupational Therapy Treatment Patient Details Name: Becky Stout MRN: [id number] DOB: [birth year] Today's Date: 01/02/2019    History of present illness 63 y.o. female with Past medical history of B12 deficiency, type II DM not on therapy, HTN, IDA, obesity, WPW syndrome.Pt dx with acute CVA, MRI reveals multiple cerebral and brainstem lacunar infarcts, acute metabolic encepholopathy, Hypertensive emergency, DKA, agorophobia, N/V, hypokalemia, and FTT.     OT comments  Pt currently required min encouragement to transfer to the recliner this session. However, despite max education and importance of participating with self-care ADL, pt continued to decline and became agitated when therapist continued to encourage. Pt continues to demonstrate self-limiting behaviors impacting her safety and independence with ADL/IADL and functional mobility. Pt will continue to benefit from skilled OT services to maximize safety and independence with ADL/IADL and functional mobility. Decreased pt's frequency due to limited participation during session. Will continue to follow acutely and progress as tolerated.    Follow Up Recommendations  SNF    Equipment Recommendations  Other (comment)(defer to next venue)    Recommendations for Other Services      Precautions / Restrictions Precautions Precautions: Fall Precaution Comments: extremely anxious, doesn't want to be touched (if at all possible) and fearful of falling, fearful of leaving room Restrictions Weight Bearing Restrictions: No       Mobility Bed Mobility Overal bed mobility: Needs Assistance Bed Mobility: Supine to Sit Rolling: Supervision         General bed mobility comments: use of bed rail and increased time, pt demonstrated increased effort to progress trunk to upright postion  Transfers Overall transfer level: Needs assistance Equipment used: Rolling walker (2 wheeled) Transfers: Sit to/from [facility] Sit to  Stand: Min assist Stand pivot transfers: Min assist       General transfer comment: following education with demonstration pt with improvement in positioning self in front of recliner before initiating descend to sitting, however she still sits prematurely    Balance Overall balance assessment: Needs assistance Sitting-balance support: Feet supported Sitting balance-Leahy Scale: Good Sitting balance - Comments: minguard for sitting balance   Standing balance support: Bilateral upper extremity supported Standing balance-Leahy Scale: Poor Standing balance comment: reliant on BUE support on RW                           ADL either performed or assessed with clinical judgement   ADL Overall ADL's : Needs assistance/impaired                         Toilet Transfer: Moderate assistance;Stand-pivot;RW Toilet Transfer Details (indicate cue type and reason): simulated from EOB to recliner Toileting- Clothing Manipulation and Hygiene: Total assistance         General ADL Comments: pt continues to demonstrate self-limiting behaviors and declines all aspects of ADL despite max encouragement, due to this pt would require maxA-totalA for all aspects      Vision       Perception     Praxis      Cognition Arousal/Alertness: Awake/alert Behavior During Therapy: Anxious;Agitated Overall Cognitive Status: No family/caregiver present to determine baseline cognitive functioning Area of Impairment: Following commands;Safety/judgement;Awareness;Problem solving;Attention                       Following Commands: Follows one step commands inconsistently Safety/Judgement: Decreased awareness of safety;Decreased awareness of deficits Awareness: Emergent Problem Solving: Slow  processing;Decreased initiation;Difficulty sequencing;Requires verbal cues;Requires tactile cues General Comments: pt continues to be limited during session by self-limiting behaviors and  fearful/axiousness with moving. Pt is extremely particular about setup and placement of items in the room. Pt consistently declines self-care ADL stating "Oh I dont' need to do that" or "I will do it later", when encouraged more pt becomes agitated.         Exercises     Shoulder Instructions       General Comments      Pertinent Vitals/ Pain       Pain Assessment: No/denies pain  Home Living                                          Prior Functioning/Environment              Frequency  Min 2X/week        Progress Toward Goals  OT Goals(current goals can now be found in the care plan section)  Progress towards OT goals: Not progressing toward goals - comment(self limiting behaviors)  Acute Rehab OT Goals Patient Stated Goal: to sit in recliner for 1 hour and then return to bed OT Goal Formulation: With patient Time For Goal Achievement: 01/09/19 Potential to Achieve Goals: Poor ADL Goals Pt Will Perform Grooming: with supervision;sitting Pt Will Perform Upper Body Dressing: with set-up;with supervision;sitting Pt Will Perform Lower Body Dressing: with min assist;sit to/from stand Pt Will Transfer to Toilet: with min assist;stand pivot transfer;bedside commode Additional ADL Goal #1: Pt will follow 1 step commands consistently with min cues Additional ADL Goal #2: Pt will require min cues for sequencing simple, familiar grooming task  Plan Discharge plan remains appropriate    Co-evaluation                 AM-PAC OT "6 Clicks" Daily Activity     Outcome Measure   Help from another person eating meals?: A [place] Help from another person taking care of personal grooming?: A Lot Help from another person toileting, which includes using toliet, bedpan, or urinal?: A Lot Help from another person bathing (including washing, rinsing, drying)?: A Lot Help from another person to put on and taking off regular upper body clothing?: A Lot Help  from another person to put on and taking off regular lower body clothing?: A Lot 6 Click Score: 13    End of Session Equipment Utilized During Treatment: Gait belt;Rolling walker  OT Visit Diagnosis: Unsteadiness on feet (R26.81);Other abnormalities of gait and mobility (R26.89);Muscle weakness (generalized) (M62.81);History of falling (Z91.81);Other symptoms and signs involving cognitive function;Adult, failure to thrive (R62.7)   Activity Tolerance Other (comment)(pt with self-limiting behaviors)   Patient Left in bed;with call bell/phone within reach;with nursing/sitter in room   Nurse Communication Mobility status        Time: [id number] OT Time Calculation (min): 9 min  Charges: OT General Charges $OT Visit: 1 Visit OT Treatments $Self Care/Home Management : 8-22 mins  Dorinda Hill OTR/L Acute Rehabilitation Services Office: [place] 01/02/2019, 12:08 PM

## 2019-01-03 LAB — GLUCOSE, CAPILLARY
Glucose-Capillary: 154 mg/dL — ABNORMAL HIGH (ref 70–99)
Glucose-Capillary: 200 mg/dL — ABNORMAL HIGH (ref 70–99)
Glucose-Capillary: 177 mg/dL — ABNORMAL HIGH (ref 70–99)
Glucose-Capillary: 134 mg/dL — ABNORMAL HIGH (ref 70–99)

## 2019-01-03 MED ORDER — INSULIN GLARGINE 100 UNIT/ML ~~LOC~~ SOLN
14.0000 [IU] | Freq: Every day | SUBCUTANEOUS | Status: DC
  Administered 2019-01-03 – 2019-01-17 (×15): 14 [IU] via SUBCUTANEOUS
  Filled 2019-01-03 (×17): qty 0.14

## 2019-01-03 NOTE — Progress Notes (Signed)
Physical Therapy Treatment Patient Details Name: Becky Stout MRN: [id number] DOB: 1955/05/07 Today's Date: 01/03/2019    History of Present Illness 63 y.o. female with Past medical history of B12 deficiency, type II DM not on therapy, HTN, IDA, obesity, WPW syndrome.Pt dx with acute CVA, MRI reveals multiple cerebral and brainstem lacunar infarcts, acute metabolic encepholopathy, Hypertensive emergency, DKA, agorophobia, N/V, hypokalemia, and FTT.      PT Comments    Pt performed gt training with max cues and encouragement to participate.  Initially patient reports she is too tired but with education and ecouragement she is agreeable to participate in session. Pt continues to benefit from skilled therapy as she remains deconditioned from being bed bound.  She remains fearful of falling and continues to urinate and deficate on her self with no awareness or concern.  Based on her cognitive impairments and physical impairments she remains appropriate for placement at SNF.  Her mother reports she needs to be independent and able to toilet her self to return home and she is not demonstrating this behavior.  Will continue to work with patient to improve functional mobility.  Pt needs to start toileting with staff and have purewick discontinued.     Follow Up Recommendations  SNF     Equipment Recommendations  Wheelchair (measurements PT);Wheelchair cushion (measurements PT);3in1 (PT);Rolling walker with 5" wheels    Recommendations for Other Services       Precautions / Restrictions Precautions Precautions: Fall Precaution Comments: extremely anxious, doesn't want to be touched (if at all possible) and fearful of falling, fearful of leaving room Restrictions Weight Bearing Restrictions: No    Mobility  Bed Mobility Overal bed mobility: Modified Independent Bed Mobility: Supine to Sit;Sit to Supine           General bed mobility comments: NO assistance to come to sitting or return  back to bed.  Transfers Overall transfer level: Needs assistance Equipment used: Rolling walker (2 wheeled) Transfers: Sit to/from Stand Sit to Stand: Min assist         General transfer comment: Cues for hand placement to and assistance to boost into standing. Pt performed x1 from bed and x2 from from recliner chair.  Ambulation/Gait Ambulation/Gait assistance: Min guard Gait Distance (Feet): 30 Feet Assistive device: Rolling walker (2 wheeled) Gait Pattern/deviations: Step-to pattern;Shuffle;Trunk flexed;Wide base of support     General Gait Details: Pt continues with wide waddling BOS but using RW more safely.  She required max cues for encouragement to increase gt distance.  Pt progressing slowly and remains very fearful of falling.   Stairs             Wheelchair Mobility    Modified Rankin (Stroke Patients Only) Modified Rankin (Stroke Patients Only) Pre-Morbid Rankin Score: Moderate disability Modified Rankin: Moderately severe disability     Balance Overall balance assessment: Needs assistance Sitting-balance support: Feet supported Sitting balance-Leahy Scale: Good Sitting balance - Comments: minguard for sitting balance   Standing balance support: Bilateral upper extremity supported Standing balance-Leahy Scale: Poor Standing balance comment: reliant on BUE support on RW                            Cognition Arousal/Alertness: Awake/alert Behavior During Therapy: Anxious;WFL for tasks assessed/performed Overall Cognitive Status: Impaired/Different from baseline(but this appears to be her baseline.)  General Comments: Pt had x1 out burst in which she called therapist an "idiot"  after redirection she was compliant and participated in session.  Pt appears closer to baseline functioning but remains extremely fearful of falling.      Exercises      General Comments        Pertinent  Vitals/Pain Pain Assessment: No/denies pain    Home Living                      Prior Function            PT Goals (current goals can now be found in the care plan section) Acute Rehab PT Goals Patient Stated Goal: To walk back to bed. Potential to Achieve Goals: Fair Additional Goals Additional Goal #1: Patient to propel w/c x 100' with S. Progress towards PT goals: Progressing toward goals    Frequency    Min 3X/week      PT Plan Current plan remains appropriate    Co-evaluation              AM-PAC PT "6 Clicks" Mobility   Outcome Measure  Help needed turning from your back to your side while in a flat bed without using bedrails?: None Help needed moving from lying on your back to sitting on the side of a flat bed without using bedrails?: None Help needed moving to and from a bed to a chair (including a wheelchair)?: A [place] Help needed standing up from a chair using your arms (e.g., wheelchair or bedside chair)?: A Wieman Help needed to walk in hospital room?: A Linder Help needed climbing 3-5 steps with a railing? : A [place] 6 Click Score: 20    End of Session Equipment Utilized During Treatment: Gait belt Activity Tolerance: Patient tolerated treatment well Patient left: in bed;with call bell/phone within reach;with bed alarm set Nurse Communication: Mobility status PT Visit Diagnosis: Other abnormalities of gait and mobility (R26.89)     Time: [id number] PT Time Calculation (min) (ACUTE ONLY): 22 min  Charges:  $Gait Training: 8-22 mins                     Erasmo Leventhal , PTA Acute Rehabilitation Services Pager [phone] Office [phone]     Becky Stout 01/03/2019, 12:21 PM

## 2019-01-03 NOTE — TOC Progression Note (Signed)
Transition of Care [facility]) - Progression Note    Patient Details  Name: Becky Stout MRN: [id number] Date of Birth: 1955/12/22  Transition of Care [facility]) CM/SW [place], [place] Phone Number: 01/03/2019, 4:39 PM  Clinical Narrative:    CSW spoke with Ebony in financial counseling. No Medicaid application has been submitted since pt has 401k assets to spend down. Pt sister had a meeting with [employer] today to work on disability application. CSW will reach out to SNFs regarding spend down possibly going to SNF placement in order to obtain placement and work on [place].   TOC team continuing to follow.    Expected Discharge Plan: [place] Barriers to Discharge: Inadequate or no insurance  Expected Discharge Plan and Services Expected Discharge Plan: [place] In-house Referral: Clinical Social Work Discharge Planning Services: CM Consult Post Acute Care Choice: [place] arrangements for the past 2 months: Single Family Home                   Readmission Risk Interventions No flowsheet data found.

## 2019-01-03 NOTE — Progress Notes (Signed)
PROGRESS NOTE    Becky Stout   [id number]  DOB: 1955-04-06  DOA: 11/03/2018 PCP: Abner Greenspan, MD   Brief Narrative:  Becky Stout is a 63 year old female with PMH of DM 2, HTN, WPW syndrome, frequent falls, confusion, and weight loss was admitted for DKA, acute CVA and metabolic encephalopathy.  Patient has been medically stable for discharge for several weeks but ongoing prolonged hospital stay due to difficulty in SNF placement.   Subjective: The patient has no complaints today. When discussing getting out of the bed to have a BM or to urinate instead, she states "she does not do that anymore".     Assessment & Plan:   Principal Problem:   DKA (diabetic ketoacidoses)- presenting problem -This was partly the initial reason for the patient's hospital admission and has resolved -Patient is currently on Lantus with NovoLog and also Tradjenta -Her hemoglobin A1c was noted to be 7.9  Active Problems:  Acute CVA-presenting problem -MRI on 10/10 revealed scattered small acute infarcts in both cerebral hemispheres and brainstem-there were also chronic basal ganglia infarcts and moderately advanced cerebral atrophy -2D echo was performed on 10/11 which showed normal EF, impaired LV diastolic filling suggestive of diastolic heart failure-no thrombus was noted -Carotid ultrasound and transcranial Dopplers were unremarkable -Continue statins & Plavix as recommended by neurology -Awaiting skilled nursing facility placement  WPW syndrome -No active issues  Essential hypertension Continue carvedilol, amlodipine, lisinopril-continue to follow BP and adjust medications as needed  Anxiety/depression and agoraphobia  -Continue Zoloft which has been started during this hospital stay by psychiatry - she continues to be slightly confused - for example, she states she no longer has diabetes and no longer defecates and urinates- she has very poor insight into her condition  Cognitive  decline -This is noted during the hospital stay and also confirmed by family  Hypokalemia and hypomagnesemia -Have been adequately replaced  Obesity -Body mass index is 37.84 kg/m.     Time spent in minutes: 35 DVT prophylaxis: Lovenox Code Status: DO NOT RESUSCITATE Family Communication:  Disposition Plan: -awaiting skilled nursing placement- she is not safe to return to home Consultants:   Neurology  Psychiatry Procedures:   2D echo - 1. Left ventricular ejection fraction, by visual estimation, is 60 to 65%. The left ventricle has normal function. Normal left ventricular size. There is mildly increased left ventricular hypertrophy.  2. Elevated mean left atrial pressure.  3. Left ventricular diastolic Doppler parameters are consistent with impaired relaxation pattern of LV diastolic filling.  4. Global right ventricle has normal systolic function.The right ventricular size is normal. No increase in right ventricular wall thickness.  5. Left atrial size was normal.  6. Right atrial size was normal.  7. The mitral valve is normal in structure. No evidence of mitral valve regurgitation. No evidence of mitral stenosis.  8. The tricuspid valve is normal in structure. Tricuspid valve regurgitation was not visualized by color flow Doppler.  9. The aortic valve is normal in structure. Aortic valve regurgitation was not visualized by color flow Doppler. Structurally normal aortic valve, with no evidence of sclerosis or stenosis. 10. The pulmonic valve was normal in structure. Pulmonic valve regurgitation is mild by color flow Doppler.  11. The inferior vena cava is normal in size with greater than 50% respiratory variability, suggesting right atrial pressure of 3 mmHg. Antimicrobials:  Anti-infectives (From admission, onward)   Start     Dose/Rate Route Frequency Ordered Stop   11/03/18  1930  cefTRIAXone (ROCEPHIN) 1 g in sodium chloride 0.9 % 100 mL IVPB  Status:  Discontinued      1 g 200 mL/hr over 30 Minutes Intravenous Every 24 hours 11/03/18 1724 11/06/18 1118   11/03/18 0530  cefTRIAXone (ROCEPHIN) 1 g in sodium chloride 0.9 % 100 mL IVPB     1 g 200 mL/hr over 30 Minutes Intravenous  Once 11/03/18 0525 11/03/18 0629       Objective: Vitals:   01/02/19 2300 01/03/19 0400 01/03/19 0447 01/03/19 0757  BP: 123/60 136/67  (!) 108/49  Pulse: 71 73  72  Resp: 18 18  17   Temp: 98.5 F (36.9 C) 98.3 F (36.8 C)  98.3 F (36.8 C)  TempSrc: Oral Oral  Oral  SpO2: 97% 99%  98%  Weight:   100 kg   Height:        Intake/Output Summary (Last 24 hours) at 01/03/2019 0915 Last data filed at 01/03/2019 0500 Gross per 24 hour  Intake 340 ml  Output 700 ml  Net -360 ml   Filed Weights   01/01/19 0500 01/02/19 0500 01/03/19 0447  Weight: 97 kg 99 kg 100 kg    Examination: General exam: Appears comfortable  HEENT: PERRLA, oral mucosa moist, no sclera icterus or thrush Respiratory system: Clear to auscultation. Respiratory effort normal. Cardiovascular system: S1 & S2 heard,  No murmurs  Gastrointestinal system: Abdomen soft, non-tender, nondistended. Normal bowel sounds   Central nervous system: Alert and oriented. No focal neurological deficits. Extremities: No cyanosis, clubbing or edema Skin: No rashes or ulcers Psychiatry:  Mood & affect appropriate.     Data Reviewed: I have personally reviewed following labs and imaging studies  CBC: No results for input(s): WBC, NEUTROABS, HGB, HCT, MCV, PLT in the last 168 hours. Basic Metabolic Panel: No results for input(s): NA, K, CL, CO2, GLUCOSE, BUN, CREATININE, CALCIUM, MG, PHOS in the last 168 hours. GFR: Estimated Creatinine Clearance: 84.9 mL/min (by C-G formula based on SCr of 0.63 mg/dL). Liver Function Tests: No results for input(s): AST, ALT, ALKPHOS, BILITOT, PROT, ALBUMIN in the last 168 hours. No results for input(s): LIPASE, AMYLASE in the last 168 hours. No results for input(s): AMMONIA  in the last 168 hours. Coagulation Profile: No results for input(s): INR, PROTIME in the last 168 hours. Cardiac Enzymes: No results for input(s): CKTOTAL, CKMB, CKMBINDEX, TROPONINI in the last 168 hours. BNP (last 3 results) No results for input(s): PROBNP in the last 8760 hours. HbA1C: No results for input(s): HGBA1C in the last 72 hours. CBG: Recent Labs  Lab 01/02/19 0605 01/02/19 1122 01/02/19 1602 01/02/19 2101 01/03/19 0625  GLUCAP 156* 209* 155* 165* 154*   Lipid Profile: No results for input(s): CHOL, HDL, LDLCALC, TRIG, CHOLHDL, LDLDIRECT in the last 72 hours. Thyroid Function Tests: No results for input(s): TSH, T4TOTAL, FREET4, T3FREE, THYROIDAB in the last 72 hours. Anemia Panel: No results for input(s): VITAMINB12, FOLATE, FERRITIN, TIBC, IRON, RETICCTPCT in the last 72 hours. Urine analysis:    Component Value Date/Time   COLORURINE YELLOW 11/03/2018 0341   APPEARANCEUR CLOUDY (A) 11/03/2018 0341   LABSPEC 1.024 11/03/2018 0341   PHURINE 6.0 11/03/2018 0341   GLUCOSEU >=500 (A) 11/03/2018 0341   HGBUR MODERATE (A) 11/03/2018 0341   HGBUR negative 11/01/2008 1121   BILIRUBINUR NEGATIVE 11/03/2018 0341   BILIRUBINUR neg. 12/21/2012 0933   KETONESUR 80 (A) 11/03/2018 0341   PROTEINUR 30 (A) 11/03/2018 0341   UROBILINOGEN 0.2 12/21/2012 0933  UROBILINOGEN 0.2 11/01/2008 1121   NITRITE NEGATIVE 11/03/2018 0341   LEUKOCYTESUR MODERATE (A) 11/03/2018 0341   Sepsis Labs: @LABRCNTIP (procalcitonin:4,lacticidven:4) )No results found for this or any previous visit (from the past 240 hour(s)).       Radiology Studies: No results found.    Scheduled Meds: . amLODipine  10 mg Oral Daily  . atorvastatin  80 mg Oral q1800  . carvedilol  6.25 mg Oral BID WC  . clopidogrel  75 mg Oral Daily  . enoxaparin (LOVENOX) injection  40 mg Subcutaneous Q24H  . feeding supplement  1 Container Oral BID BM  . feeding supplement (PRO-STAT SUGAR FREE 64)  30 mL Oral  TID  . insulin aspart  0-15 Units Subcutaneous TID WC  . insulin aspart  0-5 Units Subcutaneous QHS  . insulin aspart  4 Units Subcutaneous TID WC  . insulin glargine  10 Units Subcutaneous QHS  . linagliptin  5 mg Oral Daily  . lisinopril  40 mg Oral Daily  . pantoprazole  40 mg Oral Daily  . sertraline  100 mg Oral Daily  . sodium chloride flush  10-40 mL Intracatheter Q12H  . thiamine  100 mg Oral Daily   Continuous Infusions:   LOS: 61 days      Debbe Odea, MD [facility] Pager: [email or web address] Password [email or web address] 01/03/2019, 9:15 AM
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
## 2019-01-03 NOTE — Progress Notes (Addendum)
No blood return from Mid line, will notify Lab.

## 2019-01-04 LAB — GLUCOSE, CAPILLARY
Glucose-Capillary: 170 mg/dL — ABNORMAL HIGH (ref 70–99)
Glucose-Capillary: 199 mg/dL — ABNORMAL HIGH (ref 70–99)
Glucose-Capillary: 196 mg/dL — ABNORMAL HIGH (ref 70–99)
Glucose-Capillary: 140 mg/dL — ABNORMAL HIGH (ref 70–99)

## 2019-01-04 NOTE — Progress Notes (Signed)
PROGRESS NOTE    Becky Stout   [id number]  DOB: 08-Nov-1955  DOA: 11/03/2018 PCP: Abner Greenspan, MD   Brief Narrative:  Becky Stout is a 63 year old female with PMH of DM 2, HTN, WPW syndrome, frequent falls, confusion, and weight loss was admitted for DKA, acute CVA and metabolic encephalopathy.  Patient has been medically stable for discharge for several weeks but ongoing prolonged hospital stay due to difficulty in SNF placement.   Subjective: She has no complaints today. Asking when she can go home.    Assessment & Plan:   Principal Problem:   DKA (diabetic ketoacidoses)- presenting problem -This was partly the initial reason for the patient's hospital admission and has resolved -Patient is currently on Lantus with NovoLog and also Tradjenta -Her hemoglobin A1c was noted to be 7.9  Active Problems:  Acute CVA-presenting problem -MRI on 10/10 revealed scattered small acute infarcts in both cerebral hemispheres and brainstem-there were also chronic basal ganglia infarcts and moderately advanced cerebral atrophy -2D echo was performed on 10/11 which showed normal EF, impaired LV diastolic filling suggestive of diastolic heart failure-no thrombus was noted -Carotid ultrasound and transcranial Dopplers were unremarkable -Continue statins & Plavix as recommended by neurology -Awaiting skilled nursing facility placement- she continues to work with PT but is often hesitant to- she refuses to use the bathroom and prefers to be incontinent in the bed- I have discussed this with her again today  WPW syndrome -No active issues  Essential hypertension Continue carvedilol, amlodipine, lisinopril-continue to follow BP and adjust medications as needed  Anxiety/depression and agoraphobia  Cognitive decline -  Cognitive decline noted during the hospital stay and also confirmed by family -  evaluated by psychiatry and started on Zoloft for psychiatric concerns - she continues  to be slightly confused - for example, she states she no longer has diabetes and when asked to try to get out of bed to use the restroom, she longer defecates and urinates- she has poor insight into her condition - Zoloft increased to 100 mg daily  Hypokalemia and hypomagnesemia -Have been adequately replaced  Obesity -Body mass index is 37.84 kg/m.     Time spent in minutes: 25 DVT prophylaxis: Lovenox Code Status: DO NOT RESUSCITATE Family Communication:  Disposition Plan: -awaiting skilled nursing placement- she is not safe to return to home Consultants:   Neurology  Psychiatry Procedures:   2D echo - 1. Left ventricular ejection fraction, by visual estimation, is 60 to 65%. The left ventricle has normal function. Normal left ventricular size. There is mildly increased left ventricular hypertrophy.  2. Elevated mean left atrial pressure.  3. Left ventricular diastolic Doppler parameters are consistent with impaired relaxation pattern of LV diastolic filling.  4. Global right ventricle has normal systolic function.The right ventricular size is normal. No increase in right ventricular wall thickness.  5. Left atrial size was normal.  6. Right atrial size was normal.  7. The mitral valve is normal in structure. No evidence of mitral valve regurgitation. No evidence of mitral stenosis.  8. The tricuspid valve is normal in structure. Tricuspid valve regurgitation was not visualized by color flow Doppler.  9. The aortic valve is normal in structure. Aortic valve regurgitation was not visualized by color flow Doppler. Structurally normal aortic valve, with no evidence of sclerosis or stenosis. 10. The pulmonic valve was normal in structure. Pulmonic valve regurgitation is mild by color flow Doppler.  11. The inferior vena cava is normal in size  with greater than 50% respiratory variability, suggesting right atrial pressure of 3 mmHg. Antimicrobials:  Anti-infectives (From admission,  onward)   Start     Dose/Rate Route Frequency Ordered Stop   11/03/18 1930  cefTRIAXone (ROCEPHIN) 1 g in sodium chloride 0.9 % 100 mL IVPB  Status:  Discontinued     1 g 200 mL/hr over 30 Minutes Intravenous Every 24 hours 11/03/18 1724 11/06/18 1118   11/03/18 0530  cefTRIAXone (ROCEPHIN) 1 g in sodium chloride 0.9 % 100 mL IVPB     1 g 200 mL/hr over 30 Minutes Intravenous  Once 11/03/18 0525 11/03/18 0629       Objective: Vitals:   01/03/19 2341 01/04/19 0350 01/04/19 0500 01/04/19 0855  BP: 129/60 (!) 141/62  (!) 120/55  Pulse: 67 72  68  Resp: 18 15  16   Temp: 98.3 F (36.8 C) 98 F (36.7 C)  98.5 F (36.9 C)  TempSrc: Oral Oral  Oral  SpO2: 99% 99%  98%  Weight:   100 kg   Height:        Intake/Output Summary (Last 24 hours) at 01/04/2019 0908 Last data filed at 01/04/2019 0546 Gross per 24 hour  Intake --  Output 300 ml  Net -300 ml   Filed Weights   01/02/19 0500 01/03/19 0447 01/04/19 0500  Weight: 99 kg 100 kg 100 kg    Examination: General exam: Appears comfortable  HEENT: PERRLA, oral mucosa moist, no sclera icterus or thrush Respiratory system: Clear to auscultation. Respiratory effort normal. Cardiovascular system: S1 & S2 heard,  No murmurs  Gastrointestinal system: Abdomen soft, non-tender, nondistended. Normal bowel sounds   Central nervous system: Alert and oriented. No focal neurological deficits. Extremities: No cyanosis, clubbing or edema Skin: No rashes or ulcers Psychiatry:  Mood & affect appropriate.     Data Reviewed: I have personally reviewed following labs and imaging studies  CBC: No results for input(s): WBC, NEUTROABS, HGB, HCT, MCV, PLT in the last 168 hours. Basic Metabolic Panel: No results for input(s): NA, K, CL, CO2, GLUCOSE, BUN, CREATININE, CALCIUM, MG, PHOS in the last 168 hours. GFR: Estimated Creatinine Clearance: 84.9 mL/min (by C-G formula based on SCr of 0.63 mg/dL). Liver Function Tests: No results for  input(s): AST, ALT, ALKPHOS, BILITOT, PROT, ALBUMIN in the last 168 hours. No results for input(s): LIPASE, AMYLASE in the last 168 hours. No results for input(s): AMMONIA in the last 168 hours. Coagulation Profile: No results for input(s): INR, PROTIME in the last 168 hours. Cardiac Enzymes: No results for input(s): CKTOTAL, CKMB, CKMBINDEX, TROPONINI in the last 168 hours. BNP (last 3 results) No results for input(s): PROBNP in the last 8760 hours. HbA1C: No results for input(s): HGBA1C in the last 72 hours. CBG: Recent Labs  Lab 01/03/19 0625 01/03/19 1159 01/03/19 1556 01/03/19 2110 01/04/19 0644  GLUCAP 154* 200* 177* 134* 170*   Lipid Profile: No results for input(s): CHOL, HDL, LDLCALC, TRIG, CHOLHDL, LDLDIRECT in the last 72 hours. Thyroid Function Tests: No results for input(s): TSH, T4TOTAL, FREET4, T3FREE, THYROIDAB in the last 72 hours. Anemia Panel: No results for input(s): VITAMINB12, FOLATE, FERRITIN, TIBC, IRON, RETICCTPCT in the last 72 hours. Urine analysis:    Component Value Date/Time   COLORURINE YELLOW 11/03/2018 0341   APPEARANCEUR CLOUDY (A) 11/03/2018 0341   LABSPEC 1.024 11/03/2018 0341   PHURINE 6.0 11/03/2018 0341   GLUCOSEU >=500 (A) 11/03/2018 0341   HGBUR MODERATE (A) 11/03/2018 0341   HGBUR negative  11/01/2008 1121   BILIRUBINUR NEGATIVE 11/03/2018 0341   BILIRUBINUR neg. 12/21/2012 0933   KETONESUR 80 (A) 11/03/2018 0341   PROTEINUR 30 (A) 11/03/2018 0341   UROBILINOGEN 0.2 12/21/2012 0933   UROBILINOGEN 0.2 11/01/2008 1121   NITRITE NEGATIVE 11/03/2018 0341   LEUKOCYTESUR MODERATE (A) 11/03/2018 0341   Sepsis Labs: @LABRCNTIP (procalcitonin:4,lacticidven:4) )No results found for this or any previous visit (from the past 240 hour(s)).       Radiology Studies: No results found.    Scheduled Meds: . amLODipine  10 mg Oral Daily  . atorvastatin  80 mg Oral q1800  . carvedilol  6.25 mg Oral BID WC  . clopidogrel  75 mg Oral  Daily  . enoxaparin (LOVENOX) injection  40 mg Subcutaneous Q24H  . feeding supplement  1 Container Oral BID BM  . feeding supplement (PRO-STAT SUGAR FREE 64)  30 mL Oral TID  . insulin aspart  0-15 Units Subcutaneous TID WC  . insulin aspart  0-5 Units Subcutaneous QHS  . insulin aspart  4 Units Subcutaneous TID WC  . insulin glargine  14 Units Subcutaneous QHS  . linagliptin  5 mg Oral Daily  . lisinopril  40 mg Oral Daily  . pantoprazole  40 mg Oral Daily  . sertraline  100 mg Oral Daily  . sodium chloride flush  10-40 mL Intracatheter Q12H  . thiamine  100 mg Oral Daily   Continuous Infusions:   LOS: 62 days      Debbe Odea, MD [facility] Pager: [email or web address] Password [email or web address] 01/04/2019, 9:08 AM

## 2019-01-04 NOTE — TOC Progression Note (Signed)
Transition of Care [facility]) - Progression Note    Patient Details  Name: Becky Stout MRN: [id number] Date of Birth: Jul 20, 1955  Transition of Care [facility]) CM/SW [place], [place] Phone Number: 01/04/2019, 12:04 PM  Clinical Narrative:    CSW spoke with [facility]. He is willing to review pt for private pay to spend down in order for pt to submit Medicaid application.   Referral resent through hub.    Expected Discharge Plan: [place] Barriers to Discharge: Inadequate or no insurance  Expected Discharge Plan and Services Expected Discharge Plan: [place] In-house Referral: Clinical Social Work Discharge Planning Services: CM Consult Post Acute Care Choice: [place] arrangements for the past 2 months: Single Family Home  Readmission Risk Interventions No flowsheet data found.

## 2019-01-05 LAB — GLUCOSE, CAPILLARY
Glucose-Capillary: 163 mg/dL — ABNORMAL HIGH (ref 70–99)
Glucose-Capillary: 152 mg/dL — ABNORMAL HIGH (ref 70–99)
Glucose-Capillary: 194 mg/dL — ABNORMAL HIGH (ref 70–99)
Glucose-Capillary: 155 mg/dL — ABNORMAL HIGH (ref 70–99)
Glucose-Capillary: 117 mg/dL — ABNORMAL HIGH (ref 70–99)

## 2019-01-05 LAB — BASIC METABOLIC PANEL
Anion gap: 11 (ref 5–15)
BUN: 17 mg/dL (ref 8–23)
CO2: 25 mmol/L (ref 22–32)
Calcium: 9.2 mg/dL (ref 8.9–10.3)
Chloride: 102 mmol/L (ref 98–111)
Creatinine, Ser: 0.6 mg/dL (ref 0.44–1.00)
GFR calc Af Amer: >60 mL/min (ref >60)
GFR calc non Af Amer: >60 mL/min (ref >60)
Glucose, Bld: 165 mg/dL — ABNORMAL HIGH (ref 70–99)
Potassium: 3.2 mmol/L — ABNORMAL LOW (ref 3.5–5.1)
Sodium: 138 mmol/L (ref 135–145)

## 2019-01-05 LAB — CBC
HCT: 37 % (ref 36.0–46.0)
Hemoglobin: 12.5 g/dL (ref 12.0–15.0)
MCH: 30.6 pg (ref 26.0–34.0)
MCHC: 33.8 g/dL (ref 30.0–36.0)
MCV: 90.7 fL (ref 80.0–100.0)
Platelets: 299 10*3/uL (ref 150–400)
RBC: 4.08 MIL/uL (ref 3.87–5.11)
RDW: 12.7 % (ref 11.5–15.5)
WBC: 9.4 10*3/uL (ref 4.0–10.5)
nRBC: 0 % (ref 0.0–0.2)

## 2019-01-05 LAB — MAGNESIUM: Magnesium: 1.8 mg/dL (ref 1.7–2.4)

## 2019-01-05 MED ORDER — POTASSIUM CHLORIDE CRYS ER 20 MEQ PO TBCR
40.0000 meq | EXTENDED_RELEASE_TABLET | ORAL | Status: AC
  Administered 2019-01-05 (×2): 40 meq via ORAL
  Filled 2019-01-05 (×2): qty 2

## 2019-01-05 NOTE — Progress Notes (Signed)
PROGRESS NOTE    Becky Stout   [id number]  DOB: 20-Oct-1955  DOA: 11/03/2018 PCP: Abner Greenspan, MD   Brief Narrative:  Becky Stout is a 63 year old female with PMH of DM 2, HTN, WPW syndrome, frequent falls, confusion, and weight loss was admitted for DKA, acute CVA and metabolic encephalopathy.  Patient has been medically stable for discharge for several weeks but ongoing prolonged hospital stay due to difficulty in SNF placement.   Subjective: No complaints.     Assessment & Plan:   Principal Problem:   DKA (diabetic ketoacidoses)- presenting problem -This was partly the initial reason for the patient's hospital admission and has resolved -Patient is currently on Lantus with NovoLog and also Tradjenta -Her hemoglobin A1c was noted to be 7.9  Active Problems:  Acute CVA with left hemiparesis -presenting problem -MRI on 10/10 revealed scattered small acute infarcts in both cerebral hemispheres and brainstem-there were also chronic basal ganglia infarcts and moderately advanced cerebral atrophy -2D echo was performed on 10/11 which showed normal EF, impaired LV diastolic filling suggestive of diastolic heart failure-no thrombus was noted -Carotid ultrasound and transcranial Dopplers were unremarkable -Continue statins & Plavix as recommended by neurology- stroke team signed off on 11/05/18 -Awaiting skilled nursing facility placement- she continues to work with PT and her left hemiparesis has improved - she refuses to use the bathroom and prefers to be incontinent in the bed-I am discussing with her daily the need to get out of bed to use restroom  WPW syndrome -No active issues  Essential hypertension Continue carvedilol, amlodipine, lisinopril-continue to follow BP and adjust medications as needed  Anxiety/depression and agoraphobia  Cognitive decline -  Cognitive decline noted by family even prior hospital admission her family had concerns about her ability to  continue to live alone - she has poor insight into her condition -  evaluated by psychiatry and started on Zoloft for psychiatric concerns - Zoloft is being titrated up and was increased to 100 mg daily on 12/11  Hypokalemia and hypomagnesemia -Have been adequately replaced  Obesity -Body mass index is 37.46 kg/m.     Time spent in minutes: 25 DVT prophylaxis: Lovenox Code Status: DO NOT RESUSCITATE Family Communication:  Disposition Plan: -awaiting skilled nursing placement- due to her cognitive state and weakness, she is not safe to return to home unless there is someone who is willing to care for her Consultants:   Neurology  Psychiatry Procedures:   2D echo - 1. Left ventricular ejection fraction, by visual estimation, is 60 to 65%. The left ventricle has normal function. Normal left ventricular size. There is mildly increased left ventricular hypertrophy.  2. Elevated mean left atrial pressure.  3. Left ventricular diastolic Doppler parameters are consistent with impaired relaxation pattern of LV diastolic filling.  4. Global right ventricle has normal systolic function.The right ventricular size is normal. No increase in right ventricular wall thickness.  5. Left atrial size was normal.  6. Right atrial size was normal.  7. The mitral valve is normal in structure. No evidence of mitral valve regurgitation. No evidence of mitral stenosis.  8. The tricuspid valve is normal in structure. Tricuspid valve regurgitation was not visualized by color flow Doppler.  9. The aortic valve is normal in structure. Aortic valve regurgitation was not visualized by color flow Doppler. Structurally normal aortic valve, with no evidence of sclerosis or stenosis. 10. The pulmonic valve was normal in structure. Pulmonic valve regurgitation is mild by color flow  Doppler.  11. The inferior vena cava is normal in size with greater than 50% respiratory variability, suggesting right atrial pressure  of 3 mmHg. Antimicrobials:  Anti-infectives (From admission, onward)   Start     Dose/Rate Route Frequency Ordered Stop   11/03/18 1930  cefTRIAXone (ROCEPHIN) 1 g in sodium chloride 0.9 % 100 mL IVPB  Status:  Discontinued     1 g 200 mL/hr over 30 Minutes Intravenous Every 24 hours 11/03/18 1724 11/06/18 1118   11/03/18 0530  cefTRIAXone (ROCEPHIN) 1 g in sodium chloride 0.9 % 100 mL IVPB     1 g 200 mL/hr over 30 Minutes Intravenous  Once 11/03/18 0525 11/03/18 0629       Objective: Vitals:   01/04/19 2154 01/05/19 0019 01/05/19 0348 01/05/19 0813  BP: 126/60 (!) 114/59 (!) 114/56 134/60  Pulse: 64 70 71 68  Resp: 16 18 16 18   Temp: 98.5 F (36.9 C) 97.6 F (36.4 C) 97.8 F (36.6 C) 98.3 F (36.8 C)  TempSrc: Oral Oral Oral Oral  SpO2: 99% 99% 100% 99%  Weight:   99 kg   Height:        Intake/Output Summary (Last 24 hours) at 01/05/2019 0906 Last data filed at 01/05/2019 0549 Gross per 24 hour  Intake 480 ml  Output 600 ml  Net -120 ml   Filed Weights   01/03/19 0447 01/04/19 0500 01/05/19 0348  Weight: 100 kg 100 kg 99 kg    Examination: General exam: Appears comfortable  HEENT: PERRLA, oral mucosa moist, no sclera icterus or thrush Respiratory system: Clear to auscultation. Respiratory effort normal. Cardiovascular system: S1 & S2 heard,  No murmurs  Gastrointestinal system: Abdomen soft, non-tender, nondistended. Normal bowel sounds   Central nervous system: Alert and oriented. No focal neurological deficits. Extremities: No cyanosis, clubbing or edema Skin: No rashes or ulcers Psychiatry:  Mood & affect appropriate.   Data Reviewed: I have personally reviewed following labs and imaging studies  CBC: Recent Labs  Lab 01/05/19 0805  WBC 9.4  HGB 12.5  HCT 37.0  MCV 90.7  PLT 123XX123   Basic Metabolic Panel: Recent Labs  Lab 01/05/19 0805  NA 138  K 3.2*  CL 102  CO2 25  GLUCOSE 165*  BUN 17  CREATININE 0.60  CALCIUM 9.2   GFR: Estimated  Creatinine Clearance: 84.4 mL/min (by C-G formula based on SCr of 0.6 mg/dL). Liver Function Tests: No results for input(s): AST, ALT, ALKPHOS, BILITOT, PROT, ALBUMIN in the last 168 hours. No results for input(s): LIPASE, AMYLASE in the last 168 hours. No results for input(s): AMMONIA in the last 168 hours. Coagulation Profile: No results for input(s): INR, PROTIME in the last 168 hours. Cardiac Enzymes: No results for input(s): CKTOTAL, CKMB, CKMBINDEX, TROPONINI in the last 168 hours. BNP (last 3 results) No results for input(s): PROBNP in the last 8760 hours. HbA1C: No results for input(s): HGBA1C in the last 72 hours. CBG: Recent Labs  Lab 01/04/19 1201 01/04/19 1736 01/04/19 2139 01/05/19 0647 01/05/19 0809  GLUCAP 199* 196* 140* 163* 152*   Lipid Profile: No results for input(s): CHOL, HDL, LDLCALC, TRIG, CHOLHDL, LDLDIRECT in the last 72 hours. Thyroid Function Tests: No results for input(s): TSH, T4TOTAL, FREET4, T3FREE, THYROIDAB in the last 72 hours. Anemia Panel: No results for input(s): VITAMINB12, FOLATE, FERRITIN, TIBC, IRON, RETICCTPCT in the last 72 hours. Urine analysis:    Component Value Date/Time   COLORURINE YELLOW 11/03/2018 [place]  CLOUDY (A) 11/03/2018 0341   LABSPEC 1.024 11/03/2018 0341   PHURINE 6.0 11/03/2018 0341   GLUCOSEU >=500 (A) 11/03/2018 0341   HGBUR MODERATE (A) 11/03/2018 0341   HGBUR negative 11/01/2008 1121   BILIRUBINUR NEGATIVE 11/03/2018 0341   BILIRUBINUR neg. 12/21/2012 0933   KETONESUR 80 (A) 11/03/2018 0341   PROTEINUR 30 (A) 11/03/2018 0341   UROBILINOGEN 0.2 12/21/2012 0933   UROBILINOGEN 0.2 11/01/2008 1121   NITRITE NEGATIVE 11/03/2018 0341   LEUKOCYTESUR MODERATE (A) 11/03/2018 0341   Sepsis Labs: @LABRCNTIP (procalcitonin:4,lacticidven:4) )No results found for this or any previous visit (from the past 240 hour(s)).       Radiology Studies: No results found.    Scheduled Meds: . amLODipine   10 mg Oral Daily  . atorvastatin  80 mg Oral q1800  . carvedilol  6.25 mg Oral BID WC  . clopidogrel  75 mg Oral Daily  . enoxaparin (LOVENOX) injection  40 mg Subcutaneous Q24H  . feeding supplement  1 Container Oral BID BM  . feeding supplement (PRO-STAT SUGAR FREE 64)  30 mL Oral TID  . insulin aspart  0-15 Units Subcutaneous TID WC  . insulin aspart  0-5 Units Subcutaneous QHS  . insulin aspart  4 Units Subcutaneous TID WC  . insulin glargine  14 Units Subcutaneous QHS  . linagliptin  5 mg Oral Daily  . lisinopril  40 mg Oral Daily  . pantoprazole  40 mg Oral Daily  . sertraline  100 mg Oral Daily  . sodium chloride flush  10-40 mL Intracatheter Q12H  . thiamine  100 mg Oral Daily   Continuous Infusions:   LOS: 63 days      Debbe Odea, MD [facility] Pager: [email or web address] Password [email or web address] 01/05/2019, 9:06 AM

## 2019-01-06 LAB — GLUCOSE, CAPILLARY
Glucose-Capillary: 179 mg/dL — ABNORMAL HIGH (ref 70–99)
Glucose-Capillary: 240 mg/dL — ABNORMAL HIGH (ref 70–99)
Glucose-Capillary: 123 mg/dL — ABNORMAL HIGH (ref 70–99)
Glucose-Capillary: 162 mg/dL — ABNORMAL HIGH (ref 70–99)

## 2019-01-06 NOTE — Progress Notes (Signed)
PROGRESS NOTE    Becky Stout   [id number]  DOB: 10-29-1955  DOA: 11/03/2018 PCP: Abner Greenspan, MD   Brief Narrative:  Becky Stout is a 63 year old female with PMH of DM 2, HTN, WPW syndrome, frequent falls, confusion, and weight loss was admitted for DKA, acute CVA and metabolic encephalopathy.  Patient has been medically stable for discharge for several weeks but ongoing prolonged hospital stay due to difficulty in SNF placement.   Subjective: She has no complaints    Assessment & Plan:   Principal Problem:   DKA (diabetic ketoacidoses)- presenting problem -This was partly the initial reason for the patient's hospital admission and has resolved -Patient is currently on Lantus with NovoLog and also Tradjenta -Her hemoglobin A1c was noted to be 7.9  Active Problems:  Acute CVA with left hemiparesis -presenting problem -MRI on 10/10 revealed scattered small acute infarcts in both cerebral hemispheres and brainstem-there were also chronic basal ganglia infarcts and moderately advanced cerebral atrophy -2D echo was performed on 10/11 which showed normal EF, impaired LV diastolic filling suggestive of diastolic heart failure-no thrombus was noted -Carotid ultrasound and transcranial Dopplers were unremarkable -Continue statins & Plavix as recommended by neurology- stroke team signed off on 11/05/18 -Awaiting skilled nursing facility placement- she continues to work with PT and her left hemiparesis has improved - she refuses to use the bathroom and prefers to be incontinent in the bed-I am discussing with her daily the need to get out of bed to use restroom  WPW syndrome -No active issues  Essential hypertension Continue carvedilol, amlodipine, lisinopril-continue to follow BP and adjust medications as needed  Anxiety/depression and agoraphobia  Cognitive decline -  Cognitive decline noted by family even prior hospital admission her family had concerns about her  ability to continue to live alone - she has poor insight into her condition -  evaluated by psychiatry and started on Zoloft for psychiatric concerns - Zoloft is being titrated up and was increased to 100 mg daily on 12/11  Hypokalemia and hypomagnesemia -Have been adequately replaced  Obesity -Body mass index is 37.31 kg/m.     Time spent in minutes: 25 DVT prophylaxis: Lovenox Code Status: DO NOT RESUSCITATE Family Communication:  Disposition Plan: -awaiting skilled nursing placement- due to her cognitive state and weakness, she is not safe to return to home unless there is someone who is willing to care for her Consultants:   Neurology  Psychiatry Procedures:   2D echo - 1. Left ventricular ejection fraction, by visual estimation, is 60 to 65%. The left ventricle has normal function. Normal left ventricular size. There is mildly increased left ventricular hypertrophy.  2. Elevated mean left atrial pressure.  3. Left ventricular diastolic Doppler parameters are consistent with impaired relaxation pattern of LV diastolic filling.  4. Global right ventricle has normal systolic function.The right ventricular size is normal. No increase in right ventricular wall thickness.  5. Left atrial size was normal.  6. Right atrial size was normal.  7. The mitral valve is normal in structure. No evidence of mitral valve regurgitation. No evidence of mitral stenosis.  8. The tricuspid valve is normal in structure. Tricuspid valve regurgitation was not visualized by color flow Doppler.  9. The aortic valve is normal in structure. Aortic valve regurgitation was not visualized by color flow Doppler. Structurally normal aortic valve, with no evidence of sclerosis or stenosis. 10. The pulmonic valve was normal in structure. Pulmonic valve regurgitation is mild by color  flow Doppler.  11. The inferior vena cava is normal in size with greater than 50% respiratory variability, suggesting right atrial  pressure of 3 mmHg. Antimicrobials:  Anti-infectives (From admission, onward)   Start     Dose/Rate Route Frequency Ordered Stop   11/03/18 1930  cefTRIAXone (ROCEPHIN) 1 g in sodium chloride 0.9 % 100 mL IVPB  Status:  Discontinued     1 g 200 mL/hr over 30 Minutes Intravenous Every 24 hours 11/03/18 1724 11/06/18 1118   11/03/18 0530  cefTRIAXone (ROCEPHIN) 1 g in sodium chloride 0.9 % 100 mL IVPB     1 g 200 mL/hr over 30 Minutes Intravenous  Once 11/03/18 0525 11/03/18 0629       Objective: Vitals:   01/05/19 2322 01/06/19 0327 01/06/19 0452 01/06/19 0818  BP: (!) 119/55 120/66  (!) 121/51  Pulse: 69 72  69  Resp: 18 18  17   Temp: 98.5 F (36.9 C) 98.3 F (36.8 C)  98 F (36.7 C)  TempSrc: Oral Oral  Oral  SpO2: 100% 100%  99%  Weight: 98.5 kg  98.6 kg   Height:        Intake/Output Summary (Last 24 hours) at 01/06/2019 1049 Last data filed at 01/06/2019 0752 Gross per 24 hour  Intake 240 ml  Output 500 ml  Net -260 ml   Filed Weights   01/05/19 0348 01/05/19 2322 01/06/19 0452  Weight: 99 kg 98.5 kg 98.6 kg    Examination: General exam: Appears comfortable  HEENT: PERRLA, oral mucosa moist, no sclera icterus or thrush Respiratory system: Clear to auscultation. Respiratory effort normal. Cardiovascular system: S1 & S2 heard,  No murmurs  Gastrointestinal system: Abdomen soft, non-tender, nondistended. Normal bowel sounds   Central nervous system: Alert and oriented. No focal neurological deficits. Extremities: No cyanosis, clubbing or edema Skin: No rashes or ulcers Psychiatry:  Mood & affect appropriate.   Data Reviewed: I have personally reviewed following labs and imaging studies  CBC: Recent Labs  Lab 01/05/19 0805  WBC 9.4  HGB 12.5  HCT 37.0  MCV 90.7  PLT 123XX123   Basic Metabolic Panel: Recent Labs  Lab 01/05/19 0805  NA 138  K 3.2*  CL 102  CO2 25  GLUCOSE 165*  BUN 17  CREATININE 0.60  CALCIUM 9.2  MG 1.8   GFR: Estimated  Creatinine Clearance: 84.3 mL/min (by C-G formula based on SCr of 0.6 mg/dL). Liver Function Tests: No results for input(s): AST, ALT, ALKPHOS, BILITOT, PROT, ALBUMIN in the last 168 hours. No results for input(s): LIPASE, AMYLASE in the last 168 hours. No results for input(s): AMMONIA in the last 168 hours. Coagulation Profile: No results for input(s): INR, PROTIME in the last 168 hours. Cardiac Enzymes: No results for input(s): CKTOTAL, CKMB, CKMBINDEX, TROPONINI in the last 168 hours. BNP (last 3 results) No results for input(s): PROBNP in the last 8760 hours. HbA1C: No results for input(s): HGBA1C in the last 72 hours. CBG: Recent Labs  Lab 01/05/19 0809 01/05/19 1103 01/05/19 1708 01/05/19 2127 01/06/19 0604  GLUCAP 152* 194* 155* 117* 179*   Lipid Profile: No results for input(s): CHOL, HDL, LDLCALC, TRIG, CHOLHDL, LDLDIRECT in the last 72 hours. Thyroid Function Tests: No results for input(s): TSH, T4TOTAL, FREET4, T3FREE, THYROIDAB in the last 72 hours. Anemia Panel: No results for input(s): VITAMINB12, FOLATE, FERRITIN, TIBC, IRON, RETICCTPCT in the last 72 hours. Urine analysis:    Component Value Date/Time   COLORURINE YELLOW 11/03/2018 0341  APPEARANCEUR CLOUDY (A) 11/03/2018 0341   LABSPEC 1.024 11/03/2018 0341   PHURINE 6.0 11/03/2018 0341   GLUCOSEU >=500 (A) 11/03/2018 0341   HGBUR MODERATE (A) 11/03/2018 0341   HGBUR negative 11/01/2008 1121   BILIRUBINUR NEGATIVE 11/03/2018 0341   BILIRUBINUR neg. 12/21/2012 0933   KETONESUR 80 (A) 11/03/2018 0341   PROTEINUR 30 (A) 11/03/2018 0341   UROBILINOGEN 0.2 12/21/2012 0933   UROBILINOGEN 0.2 11/01/2008 1121   NITRITE NEGATIVE 11/03/2018 0341   LEUKOCYTESUR MODERATE (A) 11/03/2018 0341   Sepsis Labs: @LABRCNTIP (procalcitonin:4,lacticidven:4) )No results found for this or any previous visit (from the past 240 hour(s)).       Radiology Studies: No results found.    Scheduled Meds: . amLODipine   10 mg Oral Daily  . atorvastatin  80 mg Oral q1800  . carvedilol  6.25 mg Oral BID WC  . clopidogrel  75 mg Oral Daily  . enoxaparin (LOVENOX) injection  40 mg Subcutaneous Q24H  . feeding supplement  1 Container Oral BID BM  . feeding supplement (PRO-STAT SUGAR FREE 64)  30 mL Oral TID  . insulin aspart  0-15 Units Subcutaneous TID WC  . insulin aspart  0-5 Units Subcutaneous QHS  . insulin aspart  4 Units Subcutaneous TID WC  . insulin glargine  14 Units Subcutaneous QHS  . linagliptin  5 mg Oral Daily  . lisinopril  40 mg Oral Daily  . pantoprazole  40 mg Oral Daily  . sertraline  100 mg Oral Daily  . sodium chloride flush  10-40 mL Intracatheter Q12H  . thiamine  100 mg Oral Daily   Continuous Infusions:   LOS: 64 days      Debbe Odea, MD [facility] Pager: [email or web address] Password [facility] 01/06/2019, 10:49 AM

## 2019-01-07 LAB — GLUCOSE, CAPILLARY
Glucose-Capillary: 168 mg/dL — ABNORMAL HIGH (ref 70–99)
Glucose-Capillary: 153 mg/dL — ABNORMAL HIGH (ref 70–99)
Glucose-Capillary: 86 mg/dL (ref 70–99)
Glucose-Capillary: 173 mg/dL — ABNORMAL HIGH (ref 70–99)

## 2019-01-07 LAB — BASIC METABOLIC PANEL
Anion gap: 15 (ref 5–15)
BUN: 19 mg/dL (ref 8–23)
CO2: 19 mmol/L — ABNORMAL LOW (ref 22–32)
Calcium: 9.2 mg/dL (ref 8.9–10.3)
Chloride: 105 mmol/L (ref 98–111)
Creatinine, Ser: 0.54 mg/dL (ref 0.44–1.00)
GFR calc Af Amer: >60 mL/min (ref >60)
GFR calc non Af Amer: >60 mL/min (ref >60)
Glucose, Bld: 148 mg/dL — ABNORMAL HIGH (ref 70–99)
Potassium: 3.8 mmol/L (ref 3.5–5.1)
Sodium: 139 mmol/L (ref 135–145)

## 2019-01-07 MED ORDER — POTASSIUM CHLORIDE CRYS ER 20 MEQ PO TBCR
40.0000 meq | EXTENDED_RELEASE_TABLET | Freq: Once | ORAL | Status: AC
  Administered 2019-01-07: 40 meq via ORAL
  Filled 2019-01-07: qty 2

## 2019-01-07 NOTE — Progress Notes (Signed)
PROGRESS NOTE    Becky Stout   [id number]  DOB: 10/16/1955  DOA: 11/03/2018 PCP: Abner Greenspan, MD   Brief Narrative:  Becky Stout is a 63 year old female with PMH of DM 2, HTN, WPW syndrome, frequent falls, confusion, and weight loss was admitted for DKA, acute CVA and metabolic encephalopathy.  Patient has been medically stable for discharge for several weeks but ongoing prolonged hospital stay due to difficulty in SNF placement.   Subjective: She has no complaints today. Per RN, the patient remains combative at times but easily is redirectable to stop.   Assessment & Plan:   Principal Problem:   DKA (diabetic ketoacidoses)- presenting problem -This was partly the initial reason for the patient's hospital admission and has resolved -Patient is currently on Lantus with NovoLog and also Tradjenta - sugars have been quite stable -Her hemoglobin A1c was noted to be 7.9  Active Problems:  Acute CVA with left hemiparesis -presenting problem -MRI on 10/10 revealed scattered small acute infarcts in both cerebral hemispheres and brainstem-there were also chronic basal ganglia infarcts and moderately advanced cerebral atrophy -2D echo was performed on 10/11 which showed normal EF, impaired LV diastolic filling suggestive of diastolic heart failure-no thrombus was noted -Carotid ultrasound and transcranial Dopplers were unremarkable -Continue statins & Plavix as recommended by neurology- stroke team signed off on 11/05/18 -Awaiting skilled nursing facility placement- she continues to work with PT and her left hemiparesis has improved - she refuses to use the bathroom and prefers to be incontinent in the bed despite being told on a daily basis that she need to ambulate  WPW syndrome -No active issues  Essential hypertension Continue carvedilol, amlodipine, lisinopril-continue to follow BP and adjust medications as needed  Anxiety/depression and agoraphobia  Cognitive  decline -  Cognitive decline noted by family even prior hospital admission her family had concerns about her ability to continue to live alone - she has poor insight into her condition -  evaluated by psychiatry and started on Zoloft for psychiatric concerns - Zoloft is being titrated up and was increased to 100 mg daily on 12/11  Hypokalemia and hypomagnesemia -Have been adequately replaced  Obesity -Body mass index is 37.8 kg/m.     Time spent in minutes: 25 DVT prophylaxis: Lovenox Code Status: DO NOT RESUSCITATE Family Communication:  Disposition Plan: -awaiting skilled nursing placement- due to her cognitive state and weakness, she is not safe to return to home unless there is someone who is willing to care for her Consultants:   Neurology  Psychiatry Procedures:   2D echo - 1. Left ventricular ejection fraction, by visual estimation, is 60 to 65%. The left ventricle has normal function. Normal left ventricular size. There is mildly increased left ventricular hypertrophy.  2. Elevated mean left atrial pressure.  3. Left ventricular diastolic Doppler parameters are consistent with impaired relaxation pattern of LV diastolic filling.  4. Global right ventricle has normal systolic function.The right ventricular size is normal. No increase in right ventricular wall thickness.  5. Left atrial size was normal.  6. Right atrial size was normal.  7. The mitral valve is normal in structure. No evidence of mitral valve regurgitation. No evidence of mitral stenosis.  8. The tricuspid valve is normal in structure. Tricuspid valve regurgitation was not visualized by color flow Doppler.  9. The aortic valve is normal in structure. Aortic valve regurgitation was not visualized by color flow Doppler. Structurally normal aortic valve, with no evidence of sclerosis  or stenosis. 10. The pulmonic valve was normal in structure. Pulmonic valve regurgitation is mild by color flow Doppler.  11.  The inferior vena cava is normal in size with greater than 50% respiratory variability, suggesting right atrial pressure of 3 mmHg. Antimicrobials:  Anti-infectives (From admission, onward)   Start     Dose/Rate Route Frequency Ordered Stop   11/03/18 1930  cefTRIAXone (ROCEPHIN) 1 g in sodium chloride 0.9 % 100 mL IVPB  Status:  Discontinued     1 g 200 mL/hr over 30 Minutes Intravenous Every 24 hours 11/03/18 1724 11/06/18 1118   11/03/18 0530  cefTRIAXone (ROCEPHIN) 1 g in sodium chloride 0.9 % 100 mL IVPB     1 g 200 mL/hr over 30 Minutes Intravenous  Once 11/03/18 0525 11/03/18 0629       Objective: Vitals:   01/06/19 2330 01/07/19 0352 01/07/19 0358 01/07/19 0749  BP: 120/60  134/63 (!) 132/57  Pulse: 70  69 72  Resp: 18  18 16   Temp: 98.1 F (36.7 C)  98.1 F (36.7 C) 98.3 F (36.8 C)  TempSrc: Oral  Oral Oral  SpO2: 100%  99% 99%  Weight:  99.9 kg    Height:        Intake/Output Summary (Last 24 hours) at 01/07/2019 0940 Last data filed at 01/07/2019 0646 Gross per 24 hour  Intake --  Output 1100 ml  Net -1100 ml   Filed Weights   01/05/19 2322 01/06/19 0452 01/07/19 0352  Weight: 98.5 kg 98.6 kg 99.9 kg    Examination: General exam: Appears comfortable  HEENT: PERRLA, oral mucosa moist, no sclera icterus or thrush Respiratory system: Clear to auscultation. Respiratory effort normal. Cardiovascular system: S1 & S2 heard,  No murmurs  Gastrointestinal system: Abdomen soft, non-tender, nondistended. Normal bowel sounds   Central nervous system: Alert and oriented. No focal neurological deficits. Extremities: No cyanosis, clubbing or edema Skin: No rashes or ulcers Psychiatry:  Mood & affect appropriate.   Data Reviewed: I have personally reviewed following labs and imaging studies  CBC: Recent Labs  Lab 01/05/19 0805  WBC 9.4  HGB 12.5  HCT 37.0  MCV 90.7  PLT 123XX123   Basic Metabolic Panel: Recent Labs  Lab 01/05/19 0805 01/07/19 0300  NA 138  139  K 3.2* 3.8  CL 102 105  CO2 25 19*  GLUCOSE 165* 148*  BUN 17 19  CREATININE 0.60 0.54  CALCIUM 9.2 9.2  MG 1.8  --    GFR: Estimated Creatinine Clearance: 84.9 mL/min (by C-G formula based on SCr of 0.54 mg/dL). Liver Function Tests: No results for input(s): AST, ALT, ALKPHOS, BILITOT, PROT, ALBUMIN in the last 168 hours. No results for input(s): LIPASE, AMYLASE in the last 168 hours. No results for input(s): AMMONIA in the last 168 hours. Coagulation Profile: No results for input(s): INR, PROTIME in the last 168 hours. Cardiac Enzymes: No results for input(s): CKTOTAL, CKMB, CKMBINDEX, TROPONINI in the last 168 hours. BNP (last 3 results) No results for input(s): PROBNP in the last 8760 hours. HbA1C: No results for input(s): HGBA1C in the last 72 hours. CBG: Recent Labs  Lab 01/06/19 0604 01/06/19 1120 01/06/19 1717 01/06/19 2145 01/07/19 0619  GLUCAP 179* 240* 123* 162* 168*   Lipid Profile: No results for input(s): CHOL, HDL, LDLCALC, TRIG, CHOLHDL, LDLDIRECT in the last 72 hours. Thyroid Function Tests: No results for input(s): TSH, T4TOTAL, FREET4, T3FREE, THYROIDAB in the last 72 hours. Anemia Panel: No results for  input(s): VITAMINB12, FOLATE, FERRITIN, TIBC, IRON, RETICCTPCT in the last 72 hours. Urine analysis:    Component Value Date/Time   COLORURINE YELLOW 11/03/2018 0341   APPEARANCEUR CLOUDY (A) 11/03/2018 0341   LABSPEC 1.024 11/03/2018 0341   PHURINE 6.0 11/03/2018 0341   GLUCOSEU >=500 (A) 11/03/2018 0341   HGBUR MODERATE (A) 11/03/2018 0341   HGBUR negative 11/01/2008 1121   BILIRUBINUR NEGATIVE 11/03/2018 0341   BILIRUBINUR neg. 12/21/2012 0933   KETONESUR 80 (A) 11/03/2018 0341   PROTEINUR 30 (A) 11/03/2018 0341   UROBILINOGEN 0.2 12/21/2012 0933   UROBILINOGEN 0.2 11/01/2008 1121   NITRITE NEGATIVE 11/03/2018 0341   LEUKOCYTESUR MODERATE (A) 11/03/2018 0341   Sepsis Labs: @LABRCNTIP (procalcitonin:4,lacticidven:4) )No results  found for this or any previous visit (from the past 240 hour(s)).       Radiology Studies: No results found.    Scheduled Meds: . amLODipine  10 mg Oral Daily  . atorvastatin  80 mg Oral q1800  . carvedilol  6.25 mg Oral BID WC  . clopidogrel  75 mg Oral Daily  . enoxaparin (LOVENOX) injection  40 mg Subcutaneous Q24H  . feeding supplement  1 Container Oral BID BM  . feeding supplement (PRO-STAT SUGAR FREE 64)  30 mL Oral TID  . insulin aspart  0-15 Units Subcutaneous TID WC  . insulin aspart  0-5 Units Subcutaneous QHS  . insulin aspart  4 Units Subcutaneous TID WC  . insulin glargine  14 Units Subcutaneous QHS  . linagliptin  5 mg Oral Daily  . lisinopril  40 mg Oral Daily  . pantoprazole  40 mg Oral Daily  . sertraline  100 mg Oral Daily  . sodium chloride flush  10-40 mL Intracatheter Q12H  . thiamine  100 mg Oral Daily   Continuous Infusions:   LOS: 65 days      Debbe Odea, MD [facility] Pager: [email or web address] Password [email or web address] 01/07/2019, 9:40 AM

## 2019-01-07 NOTE — Plan of Care (Signed)
  Problem: Activity: Goal: Risk for activity intolerance will decrease Outcome: Progressing   

## 2019-01-07 NOTE — Plan of Care (Signed)
PT goals updated and POC date extended.  Patient progressing well towards goals, will continue to benefit from skilled PT in the acute care setting.  Remains appropriate for SNF level rehab at d/c.   Magda Kiel, [place] [phone] 01/07/2019

## 2019-01-07 NOTE — Progress Notes (Signed)
Physical Therapy Treatment Patient Details Name: Becky Stout MRN: [id number] DOB: [birth year] Today's Date: 01/07/2019    History of Present Illness 63 y.o. female with Past medical history of B12 deficiency, type II DM not on therapy, HTN, IDA, obesity, WPW syndrome.Pt dx with acute CVA, MRI reveals multiple cerebral and brainstem lacunar infarcts, acute metabolic encepholopathy, Hypertensive emergency, DKA, agorophobia, N/V, hypokalemia, and FTT.      PT Comments    Pt performed gt training and functional mobility during session.  She is more receptive to participation but remains very particular in how things are done during session.  She continues to benefit from snf placement as she is unable to toilet herself due to refusal.     Follow Up Recommendations  SNF     Equipment Recommendations  Wheelchair (measurements PT);Wheelchair cushion (measurements PT);3in1 (PT);Rolling walker with 5" wheels    Recommendations for Other Services       Precautions / Restrictions Precautions Precautions: Fall Precaution Comments: extremely anxious, doesn't want to be touched (if at all possible) and fearful of falling, fearful of leaving room Restrictions Weight Bearing Restrictions: No    Mobility  Bed Mobility Overal bed mobility: Modified Independent Bed Mobility: Supine to Sit;Sit to Supine Rolling: Supervision         General bed mobility comments: NO assistance to come to sitting.  Transfers Overall transfer level: Needs assistance Equipment used: Rolling walker (2 wheeled) Transfers: Sit to/from Stand Sit to Stand: Min assist Stand pivot transfers: Min assist       General transfer comment: Cues for hand placement to and assistance to boost into standing. Pt performed x1 from bed and x1 from from recliner chair.  Ambulation/Gait Ambulation/Gait assistance: Min guard;Min assist Gait Distance (Feet): 50 Feet Assistive device: Rolling walker (2 wheeled) Gait  Pattern/deviations: Trunk flexed;Wide base of support;Step-through pattern     General Gait Details: Pt with wide stance and waddling BOS.  Pt is slow and guarded during session and very fearful of falling.  Assistance provided for turns and backing.   Stairs             Wheelchair Mobility    Modified Rankin (Stroke Patients Only) Modified Rankin (Stroke Patients Only) Pre-Morbid Rankin Score: Moderate disability Modified Rankin: Moderately severe disability     Balance Overall balance assessment: Needs assistance Sitting-balance support: Feet supported Sitting balance-Leahy Scale: Good Sitting balance - Comments: minguard for sitting balance   Standing balance support: Bilateral upper extremity supported Standing balance-Leahy Scale: Poor Standing balance comment: reliant on BUE support on RW                            Cognition Arousal/Alertness: Awake/alert Behavior During Therapy: Anxious;WFL for tasks assessed/performed Overall Cognitive Status: Impaired/Different from baseline Area of Impairment: Following commands;Safety/judgement;Awareness;Problem solving;Attention                 Orientation Level: Situation;Disoriented to Current Attention Level: Focused Memory: Decreased recall of precautions;Decreased short-term memory Following Commands: Follows one step commands inconsistently Safety/Judgement: Decreased awareness of safety;Decreased awareness of deficits Awareness: Emergent Problem Solving: Slow processing;Decreased initiation;Difficulty sequencing;Requires verbal cues;Requires tactile cues General Comments: Pt had x1 out burst in which she called therapist an "idiot"  after redirection she was compliant and participated in session.  Pt appears closer to baseline functioning but remains extremely fearful of falling.      Exercises      General Comments  Pertinent Vitals/Pain Pain Assessment: No/denies pain Faces Pain  Scale: No hurt Pain Location: generalized discomfort. Pain Descriptors / Indicators: Burning Pain Intervention(s): Monitored during session;Repositioned    Home Living                      Prior Function            PT Goals (current goals can now be found in the care plan section) Acute Rehab PT Goals Patient Stated Goal: To walk back to bed. PT Goal Formulation: With patient Time For Goal Achievement: 01/21/19 Potential to Achieve Goals: Fair Additional Goals Additional Goal #1: Patient to propel w/c x 100' with S Progress towards PT goals: Progressing toward goals    Frequency    Min 3X/week      PT Plan Current plan remains appropriate    Co-evaluation              AM-PAC PT "6 Clicks" Mobility   Outcome Measure  Help needed turning from your back to your side while in a flat bed without using bedrails?: None Help needed moving from lying on your back to sitting on the side of a flat bed without using bedrails?: None Help needed moving to and from a bed to a chair (including a wheelchair)?: A Cassaro Help needed standing up from a chair using your arms (e.g., wheelchair or bedside chair)?: A Fleming Help needed to walk in hospital room?: A Waner Help needed climbing 3-5 steps with a railing? : A Jillson 6 Click Score: 20    End of Session Equipment Utilized During Treatment: Gait belt Activity Tolerance: Patient tolerated treatment well Patient left: with call bell/phone within reach;in chair;with chair alarm set Nurse Communication: Mobility status PT Visit Diagnosis: Other abnormalities of gait and mobility (R26.89)     Time: 1101-1116 PT Time Calculation (min) (ACUTE ONLY): 15 min  Charges:  $Gait Training: 8-22 mins                     Erasmo Leventhal , PTA Acute Rehabilitation Services Pager [phone] Office [phone]     [name] 01/07/2019, 2:14 PM

## 2019-01-08 LAB — GLUCOSE, CAPILLARY
Glucose-Capillary: 146 mg/dL — ABNORMAL HIGH (ref 70–99)
Glucose-Capillary: 189 mg/dL — ABNORMAL HIGH (ref 70–99)
Glucose-Capillary: 90 mg/dL (ref 70–99)
Glucose-Capillary: 144 mg/dL — ABNORMAL HIGH (ref 70–99)

## 2019-01-08 NOTE — Progress Notes (Signed)
PROGRESS NOTE    Fidela Uhlenhake Heckel   [id number]  DOB: 01-Oct-1955  DOA: 11/03/2018 PCP: Abner Greenspan, MD   Brief Narrative:  Ezabella Hendriks [place] is a 63 year old female with PMH of DM 2, HTN, WPW syndrome, frequent falls, confusion, and weight loss was admitted for DKA, acute CVA and metabolic encephalopathy.  Patient has been medically stable for discharge for several weeks but ongoing prolonged hospital stay due to difficulty in SNF placement.   Subjective: She has no complaints.   Assessment & Plan:   Principal Problem:   DKA (diabetic ketoacidoses)- presenting problem -This was partly the initial reason for the patient's hospital admission and has resolved -Patient is currently on Lantus with NovoLog and also Tradjenta - sugars have been quite stable -Her hemoglobin A1c was noted to be 7.9  Active Problems:  Acute CVA with left hemiparesis -presenting problem -MRI on 10/10 revealed scattered small acute infarcts in both cerebral hemispheres and brainstem-there were also chronic basal ganglia infarcts and moderately advanced cerebral atrophy -2D echo was performed on 10/11 which showed normal EF, impaired LV diastolic filling suggestive of diastolic heart failure-no thrombus was noted -Carotid ultrasound and transcranial Dopplers were unremarkable -Continue statins & Plavix as recommended by neurology- stroke team signed off on 11/05/18 -Awaiting skilled nursing facility placement- she continues to work with PT and her left hemiparesis has improved - she refuses to use the bathroom and prefers to be incontinent in the bed despite being told on a daily basis that she need to ambulate  WPW syndrome -No active issues  Essential hypertension Continue carvedilol, amlodipine, lisinopril-continue to follow BP and adjust medications as needed  Anxiety/depression and agoraphobia  Cognitive decline -  Cognitive decline noted by family even prior hospital admission her family had  concerns about her ability to continue to live alone - she has poor insight into her condition -  evaluated by psychiatry and started on Zoloft for psychiatric concerns - Zoloft is being titrated up and was increased to 100 mg daily on 12/11  Hypokalemia and hypomagnesemia -Have been adequately replaced  Obesity -Body mass index is 37.46 kg/m.     Time spent in minutes: 25 DVT prophylaxis: Lovenox Code Status: DO NOT RESUSCITATE Family Communication:  Disposition Plan: -awaiting skilled nursing placement- due to her cognitive state and weakness, she is not safe to return to home unless there is someone who is willing to care for her Consultants:   Neurology  Psychiatry Procedures:   2D echo - 1. Left ventricular ejection fraction, by visual estimation, is 60 to 65%. The left ventricle has normal function. Normal left ventricular size. There is mildly increased left ventricular hypertrophy.  2. Elevated mean left atrial pressure.  3. Left ventricular diastolic Doppler parameters are consistent with impaired relaxation pattern of LV diastolic filling.  4. Global right ventricle has normal systolic function.The right ventricular size is normal. No increase in right ventricular wall thickness.  5. Left atrial size was normal.  6. Right atrial size was normal.  7. The mitral valve is normal in structure. No evidence of mitral valve regurgitation. No evidence of mitral stenosis.  8. The tricuspid valve is normal in structure. Tricuspid valve regurgitation was not visualized by color flow Doppler.  9. The aortic valve is normal in structure. Aortic valve regurgitation was not visualized by color flow Doppler. Structurally normal aortic valve, with no evidence of sclerosis or stenosis. 10. The pulmonic valve was normal in structure. Pulmonic valve regurgitation is mild  by color flow Doppler.  11. The inferior vena cava is normal in size with greater than 50% respiratory variability,  suggesting right atrial pressure of 3 mmHg. Antimicrobials:  Anti-infectives (From admission, onward)   Start     Dose/Rate Route Frequency Ordered Stop   11/03/18 1930  cefTRIAXone (ROCEPHIN) 1 g in sodium chloride 0.9 % 100 mL IVPB  Status:  Discontinued     1 g 200 mL/hr over 30 Minutes Intravenous Every 24 hours 11/03/18 1724 11/06/18 1118   11/03/18 0530  cefTRIAXone (ROCEPHIN) 1 g in sodium chloride 0.9 % 100 mL IVPB     1 g 200 mL/hr over 30 Minutes Intravenous  Once 11/03/18 0525 11/03/18 0629       Objective: Vitals:   01/08/19 0345 01/08/19 0500 01/08/19 0948 01/08/19 0952  BP: (!) 151/64   (!) 116/48  Pulse: 74  70 71  Resp: 20   16  Temp: 98.6 F (37 C)   98.6 F (37 C)  TempSrc: Oral     SpO2: 100%  99% 99%  Weight:  99 kg    Height:        Intake/Output Summary (Last 24 hours) at 01/08/2019 1133 Last data filed at 01/08/2019 0600 Gross per 24 hour  Intake 240 ml  Output 500 ml  Net -260 ml   Filed Weights   01/06/19 0452 01/07/19 0352 01/08/19 0500  Weight: 98.6 kg 99.9 kg 99 kg    Examination: General exam: Appears comfortable  HEENT: PERRLA, oral mucosa moist, no sclera icterus or thrush Respiratory system: Clear to auscultation. Respiratory effort normal. Cardiovascular system: S1 & S2 heard,  No murmurs  Gastrointestinal system: Abdomen soft, non-tender, nondistended. Normal bowel sounds   Central nervous system: Alert and oriented. No focal neurological deficits. Extremities: No cyanosis, clubbing or edema Skin: No rashes or ulcers Psychiatry:  Mood & affect appropriate.   Data Reviewed: I have personally reviewed following labs and imaging studies  CBC: Recent Labs  Lab 01/05/19 0805  WBC 9.4  HGB 12.5  HCT 37.0  MCV 90.7  PLT 123XX123   Basic Metabolic Panel: Recent Labs  Lab 01/05/19 0805 01/07/19 0300  NA 138 139  K 3.2* 3.8  CL 102 105  CO2 25 19*  GLUCOSE 165* 148*  BUN 17 19  CREATININE 0.60 0.54  CALCIUM 9.2 9.2  MG  1.8  --    GFR: Estimated Creatinine Clearance: 84.4 mL/min (by C-G formula based on SCr of 0.54 mg/dL). Liver Function Tests: No results for input(s): AST, ALT, ALKPHOS, BILITOT, PROT, ALBUMIN in the last 168 hours. No results for input(s): LIPASE, AMYLASE in the last 168 hours. No results for input(s): AMMONIA in the last 168 hours. Coagulation Profile: No results for input(s): INR, PROTIME in the last 168 hours. Cardiac Enzymes: No results for input(s): CKTOTAL, CKMB, CKMBINDEX, TROPONINI in the last 168 hours. BNP (last 3 results) No results for input(s): PROBNP in the last 8760 hours. HbA1C: No results for input(s): HGBA1C in the last 72 hours. CBG: Recent Labs  Lab 01/07/19 0619 01/07/19 1147 01/07/19 1551 01/07/19 2107 01/08/19 0618  GLUCAP 168* 153* 86 173* 146*   Lipid Profile: No results for input(s): CHOL, HDL, LDLCALC, TRIG, CHOLHDL, LDLDIRECT in the last 72 hours. Thyroid Function Tests: No results for input(s): TSH, T4TOTAL, FREET4, T3FREE, THYROIDAB in the last 72 hours. Anemia Panel: No results for input(s): VITAMINB12, FOLATE, FERRITIN, TIBC, IRON, RETICCTPCT in the last 72 hours. Urine analysis:  Component Value Date/Time   COLORURINE YELLOW 11/03/2018 0341   APPEARANCEUR CLOUDY (A) 11/03/2018 0341   LABSPEC 1.024 11/03/2018 0341   PHURINE 6.0 11/03/2018 0341   GLUCOSEU >=500 (A) 11/03/2018 0341   HGBUR MODERATE (A) 11/03/2018 0341   HGBUR negative 11/01/2008 1121   BILIRUBINUR NEGATIVE 11/03/2018 0341   BILIRUBINUR neg. 12/21/2012 0933   KETONESUR 80 (A) 11/03/2018 0341   PROTEINUR 30 (A) 11/03/2018 0341   UROBILINOGEN 0.2 12/21/2012 0933   UROBILINOGEN 0.2 11/01/2008 1121   NITRITE NEGATIVE 11/03/2018 0341   LEUKOCYTESUR MODERATE (A) 11/03/2018 0341   Sepsis Labs: @LABRCNTIP (procalcitonin:4,lacticidven:4) )No results found for this or any previous visit (from the past 240 hour(s)).       Radiology Studies: No results  found.    Scheduled Meds: . amLODipine  10 mg Oral Daily  . atorvastatin  80 mg Oral q1800  . carvedilol  6.25 mg Oral BID WC  . clopidogrel  75 mg Oral Daily  . enoxaparin (LOVENOX) injection  40 mg Subcutaneous Q24H  . feeding supplement  1 Container Oral BID BM  . feeding supplement (PRO-STAT SUGAR FREE 64)  30 mL Oral TID  . insulin aspart  0-15 Units Subcutaneous TID WC  . insulin aspart  0-5 Units Subcutaneous QHS  . insulin aspart  4 Units Subcutaneous TID WC  . insulin glargine  14 Units Subcutaneous QHS  . linagliptin  5 mg Oral Daily  . lisinopril  40 mg Oral Daily  . pantoprazole  40 mg Oral Daily  . sertraline  100 mg Oral Daily  . sodium chloride flush  10-40 mL Intracatheter Q12H  . thiamine  100 mg Oral Daily   Continuous Infusions:   LOS: 66 days      Debbe Odea, MD [facility] Pager: [email or web address] Password [facility] 01/08/2019, 11:33 AM

## 2019-01-08 NOTE — Progress Notes (Addendum)
Physical Therapy Treatment Patient Details Name: Becky Stout MRN: [id number] DOB: 1955/12/23 Today's Date: 01/08/2019    History of Present Illness 63 y.o. female with Past medical history of B12 deficiency, type II DM not on therapy, HTN, IDA, obesity, WPW syndrome.Pt dx with acute CVA, MRI reveals multiple cerebral and brainstem lacunar infarcts, acute metabolic encepholopathy, Hypertensive emergency, DKA, agorophobia, N/V, hypokalemia, and FTT.      PT Comments    Pt in bed upon arrival. Pt agreeable to PT after max encouragement from nursing and PT. Pt mod I for all bed mobility including rolling each direction for self care and pad mgt from nursing. Pt performed supine and seated LE therex requiring frequent cuing and redirection for proper performance. Pt required max encouragement and education to agree to get out of bed. Pt agreed to ambulate to restroom because her doctor told her she could go home when she did that. Pt required min A to rise from low bed and low toilet and min guard for ambulation with RW. Pt refusing to ambulate in hallway. Pt is extremely fearful of falling and particular about where things are placed in room and how things are done. Pt agreed to sit up in recliner for exactly one hour. Pt encouraged to sit up longer but pt reporting she could only sit for an hour because then she had to get back in the bed to do other things such as watching tv. Pt educated on importance for sitting up in recliner and getting out of bed. Pt presents with decreased strength, ROM, balance, power and activity tolerance limiting functional mobility and would benefit from continued acute PT to improve.    Follow Up Recommendations  SNF     Equipment Recommendations  Wheelchair (measurements PT);Wheelchair cushion (measurements PT);3in1 (PT);Rolling walker with 5" wheels    Recommendations for Other Services       Precautions / Restrictions Precautions Precautions: Fall Precaution  Comments: extremely anxious, doesn't want to be touched (if at all possible) and fearful of falling, fearful of leaving room Restrictions Weight Bearing Restrictions: No    Mobility  Bed Mobility Overal bed mobility: Modified Independent Bed Mobility: Rolling;Supine to Sit Rolling: Modified independent (Device/Increase time)   Supine to sit: Modified independent (Device/Increase time)     General bed mobility comments: mod I with use of bed rails for rolling each way for pericare and pad mgt, mod I to get EOB  Transfers Overall transfer level: Needs assistance Equipment used: Rolling walker (2 wheeled) Transfers: Sit to/from Stand Sit to Stand: Min assist         General transfer comment: light min A to rise, pt does not want bed elevated at all, increased difficulty to rise from low bed at lowest point, light min A to rise from low toilet, good carryover of hand placement with RW use  Ambulation/Gait Ambulation/Gait assistance: Min guard Gait Distance (Feet): 25 Feet Assistive device: Rolling walker (2 wheeled) Gait Pattern/deviations: Trunk flexed;Wide base of support;Step-through pattern Gait velocity: dec   General Gait Details: pt ambulated to/from restroom with RW, pt with slow, guarded, cautious gait pattern with wide BOS, pt very fearful of falling, no unsteadiness or buckling noted, pt refuses to walk in hallway   Stairs             Wheelchair Mobility    Modified Rankin (Stroke Patients Only) Modified Rankin (Stroke Patients Only) Pre-Morbid Rankin Score: Moderate disability Modified Rankin: Moderately severe disability     Balance  Overall balance assessment: Needs assistance Sitting-balance support: Feet supported   Sitting balance - Comments: minguard for sitting balance   Standing balance support: Bilateral upper extremity supported Standing balance-Leahy Scale: Poor Standing balance comment: reliant on BUE support on RW                             Cognition Arousal/Alertness: Awake/alert Behavior During Therapy: Anxious Overall Cognitive Status: Impaired/Different from baseline Area of Impairment: Following commands;Safety/judgement;Awareness;Problem solving;Attention                   Current Attention Level: Focused Memory: Decreased recall of precautions;Decreased short-term memory Following Commands: Follows one step commands inconsistently Safety/Judgement: Decreased awareness of safety;Decreased awareness of deficits Awareness: Emergent Problem Solving: Slow processing;Decreased initiation;Difficulty sequencing;Requires verbal cues;Requires tactile cues General Comments: pt had multiple outbursts in which she called therapist an "idiot", nursing and PT able to provide enough redirection in session, cooperative and participated well last half of session, very particular, extremely fearful of falling, pt asking if she needed to use toilet paper after using restroom and asking how to wipe requiring cuing from nursing and PT      Exercises Total Joint Exercises Ankle Circles/Pumps: AROM;Both;10 reps Straight Leg Raises: AROM;Both;10 reps Long Arc Quad: AROM;Both;20 reps Marching in Standing: AROM;Both;20 reps;Seated General Exercises - Lower Extremity Toe Raises: AROM;Both;10 reps;Seated Heel Raises: AROM;Both;10 reps;Seated    General Comments General comments (skin integrity, edema, etc.): after max encouragement from nursing and PT pt agreed to sit in recliner for exactly one hour      Pertinent Vitals/Pain Pain Assessment: No/denies pain    Home Living                      Prior Function            PT Goals (current goals can now be found in the care plan section) Progress towards PT goals: Progressing toward goals    Frequency    Min 3X/week      PT Plan Current plan remains appropriate    Co-evaluation              AM-PAC PT "6 Clicks" Mobility   Outcome  Measure  Help needed turning from your back to your side while in a flat bed without using bedrails?: None Help needed moving from lying on your back to sitting on the side of a flat bed without using bedrails?: None Help needed moving to and from a bed to a chair (including a wheelchair)?: A Laughery Help needed standing up from a chair using your arms (e.g., wheelchair or bedside chair)?: A Bachtell Help needed to walk in hospital room?: A Lorenzen Help needed climbing 3-5 steps with a railing? : A Lot 6 Click Score: 19    End of Session Equipment Utilized During Treatment: Gait belt Activity Tolerance: Patient tolerated treatment well Patient left: with call bell/phone within reach;in chair;with chair alarm set;Other (comment)(pt refusing to take call bell anywhere except on her bed, pt states she does not call for help but rather tells staff a time for them to come back and expects needs to be met at that time) Nurse Communication: Mobility status;Other (comment)(pt refusing call bell) PT Visit Diagnosis: Other abnormalities of gait and mobility (R26.89)     Time: 6283-6629 PT Time Calculation (min) (ACUTE ONLY): 32 min  Charges:  $Therapeutic Exercise: 8-22 mins $Therapeutic Activity: 8-22 mins  Zachary George PT, DPT 4:00 PM,01/08/19    Yaron Grasse Drucilla Chalet 01/08/2019, 3:55 PM

## 2019-01-09 LAB — GLUCOSE, CAPILLARY
Glucose-Capillary: 155 mg/dL — ABNORMAL HIGH (ref 70–99)
Glucose-Capillary: 172 mg/dL — ABNORMAL HIGH (ref 70–99)
Glucose-Capillary: 101 mg/dL — ABNORMAL HIGH (ref 70–99)
Glucose-Capillary: 127 mg/dL — ABNORMAL HIGH (ref 70–99)

## 2019-01-09 NOTE — Progress Notes (Signed)
PROGRESS NOTE    Becky Stout   [id number]  DOB: [birth year]  DOA: 11/03/2018 PCP: Abner Greenspan, MD   Brief Narrative:  Becky Stout is a 63 year old female with PMH of DM 2, HTN, WPW syndrome, frequent falls, confusion, and weight loss was admitted for DKA, acute CVA and metabolic encephalopathy.  Patient has been medically stable for discharge for several weeks but ongoing prolonged hospital stay due to difficulty in SNF placement.   Subjective: She has no complaints.  She reports she enjoys reading her "dog books".  Denies headache, no chest pain, palpitations, no shortness of breath, no abdominal pain.  Assessment & Plan:    DKA (diabetic ketoacidoses)- presenting problem This was partly the initial reason for the patient's hospital admission and has resolved.  Hemoglobin A1c 7.9.  Blood sugars currently stable. --Lantus 14 units subcutaneously daily --NovoLog 4 units TIDAC --Tradjenta 5mg  PO daily --CBG's qAC/HS, ISS for further coverage  Acute CVA with left hemiparesis - presenting problem MRI on 10/10 revealed scattered small acute infarcts in both cerebral hemispheres and brainstem;  also noted chronic basal ganglia infarcts and moderately advanced cerebral atrophy. TTE 10/11 with  normal EF, impaired LV diastolic filling suggestive of diastolic heart failure, no thrombus was noted. Carotid ultrasound and transcranial Dopplers were unremarkable. --Neurology signed off 11/05/2018 --Completed 3-week course of dual antiplatelet therapy with Plavix --Continue aspirin 81 mg p.o. daily --Atorvastatin 80 mg p.o. daily -Awaiting skilled nursing facility placement- she continues to work with PT and her left hemiparesis has improved - she refuses to use the bathroom and prefers to be incontinent in the bed despite being told on a daily basis that she need to ambulate  WPW syndrome -No active issues  Essential hypertension BP 123/62 this morning, well  controlled. --Amlodipine 10 mg p.o. daily --Coreg 6.25 mg p.o. twice daily --Lisinopril 40 mg p.o. daily --Hydralazine 10 mg p.o. q8h prn SBP >160  Anxiety/depression and agoraphobia Cognitive decline Cognitive decline noted by family even prior hospital admission her family had concerns about her ability to continue to live alone and she has poor insight into her condition --evaluated by psychiatry and started on Zoloft for psychiatric concerns --Zoloft increased to 100 mg daily on 12/11  Hypokalemia and hypomagnesemia -Have been adequately replaced  GERD: Continue Protonix 40 mg p.o. daily  Obesity -Body mass index is 37.65 kg/m.     DVT prophylaxis: Lovenox Code Status: DO NOT RESUSCITATE Family Communication:  Disposition Plan: awaiting skilled nursing placement- due to her cognitive state and weakness, she is not safe to return to home unless there is someone who is willing to care for her Consultants:   Neurology  Psychiatry Procedures:   2D echo - 1. Left ventricular ejection fraction, by visual estimation, is 60 to 65%. The left ventricle has normal function. Normal left ventricular size. There is mildly increased left ventricular hypertrophy.  2. Elevated mean left atrial pressure.  3. Left ventricular diastolic Doppler parameters are consistent with impaired relaxation pattern of LV diastolic filling.  4. Global right ventricle has normal systolic function.The right ventricular size is normal. No increase in right ventricular wall thickness.  5. Left atrial size was normal.  6. Right atrial size was normal.  7. The mitral valve is normal in structure. No evidence of mitral valve regurgitation. No evidence of mitral stenosis.  8. The tricuspid valve is normal in structure. Tricuspid valve regurgitation was not visualized by color flow Doppler.  9. The aortic valve  is normal in structure. Aortic valve regurgitation was not visualized by color flow Doppler.  Structurally normal aortic valve, with no evidence of sclerosis or stenosis. 10. The pulmonic valve was normal in structure. Pulmonic valve regurgitation is mild by color flow Doppler.  11. The inferior vena cava is normal in size with greater than 50% respiratory variability, suggesting right atrial pressure of 3 mmHg. Antimicrobials:  Anti-infectives (From admission, onward)   Start     Dose/Rate Route Frequency Ordered Stop   11/03/18 1930  cefTRIAXone (ROCEPHIN) 1 g in sodium chloride 0.9 % 100 mL IVPB  Status:  Discontinued     1 g 200 mL/hr over 30 Minutes Intravenous Every 24 hours 11/03/18 1724 11/06/18 1118   11/03/18 0530  cefTRIAXone (ROCEPHIN) 1 g in sodium chloride 0.9 % 100 mL IVPB     1 g 200 mL/hr over 30 Minutes Intravenous  Once 11/03/18 0525 11/03/18 0629       Objective: Vitals:   01/09/19 0307 01/09/19 0500 01/09/19 0800 01/09/19 1140  BP: 123/62  (!) 128/56 120/60  Pulse: 73  74 71  Resp: 20  18 18   Temp: 98.4 F (36.9 C)  98.5 F (36.9 C) 98.7 F (37.1 C)  TempSrc: Oral  Oral Oral  SpO2: 97%  98% 99%  Weight:  99.5 kg    Height:        Intake/Output Summary (Last 24 hours) at 01/09/2019 1222 Last data filed at 01/09/2019 0345 Gross per 24 hour  Intake 240 ml  Output 600 ml  Net -360 ml   Filed Weights   01/07/19 0352 01/08/19 0500 01/09/19 0500  Weight: 99.9 kg 99 kg 99.5 kg    Examination: General exam: Appears comfortable  HEENT: PERRLA, oral mucosa moist, no sclera icterus or thrush Respiratory system: Clear to auscultation. Respiratory effort normal. Cardiovascular system: S1 & S2 heard,  No murmurs  Gastrointestinal system: Abdomen soft, non-tender, nondistended. Normal bowel sounds   Central nervous system: Alert and oriented. No focal neurological deficits. Extremities: No cyanosis, clubbing or edema Skin: No rashes or ulcers Psychiatry:  Mood & affect appropriate.   Data Reviewed: I have personally reviewed following labs and  imaging studies  CBC: Recent Labs  Lab 01/05/19 0805  WBC 9.4  HGB 12.5  HCT 37.0  MCV 90.7  PLT [id number]   Basic Metabolic Panel: Recent Labs  Lab 01/05/19 0805 01/07/19 0300  NA 138 139  K 3.2* 3.8  CL 102 105  CO2 25 19*  GLUCOSE 165* 148*  BUN 17 19  CREATININE 0.60 0.54  CALCIUM 9.2 9.2  MG 1.8  --    GFR: Estimated Creatinine Clearance: 84.6 mL/min (by C-G formula based on SCr of 0.54 mg/dL). Liver Function Tests: No results for input(s): AST, ALT, ALKPHOS, BILITOT, PROT, ALBUMIN in the last 168 hours. No results for input(s): LIPASE, AMYLASE in the last 168 hours. No results for input(s): AMMONIA in the last 168 hours. Coagulation Profile: No results for input(s): INR, PROTIME in the last 168 hours. Cardiac Enzymes: No results for input(s): CKTOTAL, CKMB, CKMBINDEX, TROPONINI in the last 168 hours. BNP (last 3 results) No results for input(s): PROBNP in the last 8760 hours. HbA1C: No results for input(s): HGBA1C in the last 72 hours. CBG: Recent Labs  Lab 01/08/19 1305 01/08/19 1632 01/08/19 2120 01/09/19 0555 01/09/19 1139  GLUCAP 189* 90 144* 155* 172*   Lipid Profile: No results for input(s): CHOL, HDL, LDLCALC, TRIG, CHOLHDL, LDLDIRECT in the last  72 hours. Thyroid Function Tests: No results for input(s): TSH, T4TOTAL, FREET4, T3FREE, THYROIDAB in the last 72 hours. Anemia Panel: No results for input(s): VITAMINB12, FOLATE, FERRITIN, TIBC, IRON, RETICCTPCT in the last 72 hours. Urine analysis:    Component Value Date/Time   COLORURINE YELLOW 11/03/2018 0341   APPEARANCEUR CLOUDY (A) 11/03/2018 0341   LABSPEC 1.024 11/03/2018 0341   PHURINE 6.0 11/03/2018 0341   GLUCOSEU >=500 (A) 11/03/2018 0341   HGBUR MODERATE (A) 11/03/2018 0341   HGBUR negative 11/01/2008 1121   BILIRUBINUR NEGATIVE 11/03/2018 0341   BILIRUBINUR neg. 12/21/2012 0933   KETONESUR 80 (A) 11/03/2018 0341   PROTEINUR 30 (A) 11/03/2018 0341   UROBILINOGEN 0.2 12/21/2012 0933    UROBILINOGEN 0.2 11/01/2008 1121   NITRITE NEGATIVE 11/03/2018 0341   LEUKOCYTESUR MODERATE (A) 11/03/2018 0341   Sepsis Labs: @LABRCNTIP (procalcitonin:4,lacticidven:4) )No results found for this or any previous visit (from the past 240 hour(s)).       Radiology Studies: No results found.    Scheduled Meds: . amLODipine  10 mg Oral Daily  . atorvastatin  80 mg Oral q1800  . carvedilol  6.25 mg Oral BID WC  . clopidogrel  75 mg Oral Daily  . enoxaparin (LOVENOX) injection  40 mg Subcutaneous Q24H  . feeding supplement  1 Container Oral BID BM  . feeding supplement (PRO-STAT SUGAR FREE 64)  30 mL Oral TID  . insulin aspart  0-15 Units Subcutaneous TID WC  . insulin aspart  0-5 Units Subcutaneous QHS  . insulin aspart  4 Units Subcutaneous TID WC  . insulin glargine  14 Units Subcutaneous QHS  . linagliptin  5 mg Oral Daily  . lisinopril  40 mg Oral Daily  . pantoprazole  40 mg Oral Daily  . sertraline  100 mg Oral Daily  . sodium chloride flush  10-40 mL Intracatheter Q12H  . thiamine  100 mg Oral Daily   Continuous Infusions:   LOS: 67 days   Time spent: 32 minutes spent on chart review, discussion with nursing staff, consultants, updating family and interview/physical exam; more than 50% of that time was spent in counseling and/or coordination of care.    Brycen Bean J [place] DO [facility] 01/09/2019, 12:22 PM

## 2019-01-10 LAB — GLUCOSE, CAPILLARY
Glucose-Capillary: 159 mg/dL — ABNORMAL HIGH (ref 70–99)
Glucose-Capillary: 150 mg/dL — ABNORMAL HIGH (ref 70–99)
Glucose-Capillary: 226 mg/dL — ABNORMAL HIGH (ref 70–99)
Glucose-Capillary: 166 mg/dL — ABNORMAL HIGH (ref 70–99)

## 2019-01-10 NOTE — Progress Notes (Signed)
Physical Therapy Treatment Patient Details Name: Becky Stout MRN: [id number] DOB: [birth year] Today's Date: 01/10/2019    History of Present Illness 63 y.o. female with Past medical history of B12 deficiency, type II DM not on therapy, HTN, IDA, obesity, WPW syndrome.Pt dx with acute CVA, MRI reveals multiple cerebral and brainstem lacunar infarcts, acute metabolic encepholopathy, Hypertensive emergency, DKA, agorophobia, N/V, hypokalemia, and FTT.      PT Comments    Pt performed gt training and functional mobility with very cooperative behavior throughout session 90% of the time.  Pt did require x1 bout of redirection.  Pt is mobilizing into and out of bed without assistance.  She remains to require min for transfers and min guard for gt training.  Pt continues to benefit from snf placement as she will require external assistance and her mother is unable to provided this based on her current presentation. Will continue PT per POC until placement is made.     Follow Up Recommendations  SNF     Equipment Recommendations  Wheelchair (measurements PT);Wheelchair cushion (measurements PT);3in1 (PT);Rolling walker with 5" wheels    Recommendations for Other Services       Precautions / Restrictions Precautions Precautions: Fall Precaution Comments: extremely anxious, doesn't want to be touched (if at all possible) and fearful of falling, fearful of leaving room Restrictions Weight Bearing Restrictions: No    Mobility  Bed Mobility Overal bed mobility: Modified Independent   Rolling: Modified independent (Device/Increase time)   Supine to sit: Modified independent (Device/Increase time) Sit to supine: Modified independent (Device/Increase time)   General bed mobility comments: No assistance needed.  Transfers Overall transfer level: Needs assistance Equipment used: Rolling walker (2 wheeled) Transfers: Sit to/from Stand Sit to Stand: Min assist         General transfer  comment: min assistance to boost into standing.  Performed from bed. commode and recliner.  Cues for hand placement to improve safety.  Ambulation/Gait Ambulation/Gait assistance: Min guard Gait Distance (Feet): 85 Feet Assistive device: Rolling walker (2 wheeled) Gait Pattern/deviations: Trunk flexed;Wide base of support;Step-through pattern Gait velocity: dec   General Gait Details: Pt performed increased gt training with cues for upper trunk control and RW safety.  Gt does not appear to be shuffled today.   Stairs             Wheelchair Mobility    Modified Rankin (Stroke Patients Only) Modified Rankin (Stroke Patients Only) Pre-Morbid Rankin Score: Moderate disability Modified Rankin: Moderately severe disability     Balance Overall balance assessment: Needs assistance   Sitting balance-Leahy Scale: Good Sitting balance - Comments: minguard for sitting balance     Standing balance-Leahy Scale: Poor                              Cognition Arousal/Alertness: Awake/alert Behavior During Therapy: Anxious Overall Cognitive Status: Impaired/Different from baseline Area of Impairment: Following commands;Safety/judgement;Awareness;Problem solving;Attention                 Orientation Level: Situation;Disoriented to Current Attention Level: Focused Memory: Decreased recall of precautions;Decreased short-term memory Following Commands: Follows one step commands inconsistently Safety/Judgement: Decreased awareness of safety;Decreased awareness of deficits Awareness: Emergent Problem Solving: Slow processing;Decreased initiation;Difficulty sequencing;Requires verbal cues;Requires tactile cues General Comments: Pt better behaved this session with PTA, she only had one outburst in which she called PTA a "moron, your name is moron."  She was able to be redirected  and apoligized for her behavior.      Exercises      General Comments         Pertinent Vitals/Pain Pain Assessment: No/denies pain    Home Living                      Prior Function            PT Goals (current goals can now be found in the care plan section) Acute Rehab PT Goals Patient Stated Goal: To walk back to bed. Potential to Achieve Goals: Fair Additional Goals Additional Goal #1: Patient to propel w/c x 100' with S Progress towards PT goals: Progressing toward goals    Frequency    Min 3X/week      PT Plan Current plan remains appropriate    Co-evaluation              AM-PAC PT "6 Clicks" Mobility   Outcome Measure  Help needed turning from your back to your side while in a flat bed without using bedrails?: None Help needed moving from lying on your back to sitting on the side of a flat bed without using bedrails?: None Help needed moving to and from a bed to a chair (including a wheelchair)?: A Bost Help needed standing up from a chair using your arms (e.g., wheelchair or bedside chair)?: A Kothari Help needed to walk in hospital room?: A Florio Help needed climbing 3-5 steps with a railing? : A Lot 6 Click Score: 19    End of Session Equipment Utilized During Treatment: Gait belt Activity Tolerance: Patient tolerated treatment well Patient left: in bed;with call bell/phone within reach;with bed alarm set Nurse Communication: Mobility status(need for purewick placement.) PT Visit Diagnosis: Other abnormalities of gait and mobility (R26.89)     Time: [id number] PT Time Calculation (min) (ACUTE ONLY): 23 min  Charges:  $Gait Training: 8-22 mins $Therapeutic Activity: 8-22 mins                     Erasmo Leventhal , PTA Acute Rehabilitation Services Pager [phone] Office [phone]     Ashima Shrake [name] 01/10/2019, 2:43 PM

## 2019-01-10 NOTE — Progress Notes (Signed)
PROGRESS NOTE    Becky Stout   [id number]  DOB: 19-Jun-1955  DOA: 11/03/2018 PCP: Abner Greenspan, MD   Brief Narrative:  Becky Stout is a 63 year old female with PMH of DM 2, HTN, WPW syndrome, frequent falls, confusion, and weight loss was admitted for DKA, acute CVA and metabolic encephalopathy.  Patient has been medically stable for discharge for several weeks but ongoing prolonged hospital stay due to difficulty in SNF placement.   Subjective: Patient seen and examined at bedside this morning, resting comfortably.  Sleeping but easily arousable.  She has no complaints.  Denies headache, no chest pain, palpitations, no shortness of breath, no abdominal pain.  Assessment & Plan:    DKA (diabetic ketoacidoses)- presenting problem This was partly the initial reason for the patient's hospital admission and has resolved.  Hemoglobin A1c 7.9.  Blood sugars currently stable. --Lantus 14 units subcutaneously daily --NovoLog 4 units TIDAC --Tradjenta 5mg  PO daily --CBG's qAC/HS, ISS for further coverage  Acute CVA with left hemiparesis - presenting problem MRI on 10/10 revealed scattered small acute infarcts in both cerebral hemispheres and brainstem;  also noted chronic basal ganglia infarcts and moderately advanced cerebral atrophy. TTE 10/11 with  normal EF, impaired LV diastolic filling suggestive of diastolic heart failure, no thrombus was noted. Carotid ultrasound and transcranial Dopplers were unremarkable. --Neurology signed off 11/05/2018 --Completed 3-week course of dual antiplatelet therapy with Plavix --Continue aspirin 81 mg p.o. daily --Atorvastatin 80 mg p.o. daily -Awaiting skilled nursing facility placement- she continues to work with PT and her left hemiparesis has improved - she refuses to use the bathroom and prefers to be incontinent in the bed despite being told on a daily basis that she need to ambulate  WPW syndrome -No active issues  Essential  hypertension BP 123/62 this morning, well controlled. --Amlodipine 10 mg p.o. daily --Coreg 6.25 mg p.o. twice daily --Lisinopril 40 mg p.o. daily --Hydralazine 10 mg p.o. q8h prn SBP >160  Anxiety/depression and agoraphobia Cognitive decline Cognitive decline noted by family even prior hospital admission her family had concerns about her ability to continue to live alone and she has poor insight into her condition --evaluated by psychiatry and started on Zoloft for psychiatric concerns --Zoloft increased to 100 mg daily on 12/11  Hypokalemia and hypomagnesemia -Have been adequately replaced  GERD: Continue Protonix 40 mg p.o. daily  Obesity -Body mass index is 37.65 kg/m.     DVT prophylaxis: Lovenox Code Status: DO NOT RESUSCITATE Family Communication:  Disposition Plan: awaiting skilled nursing placement. Due to her cognitive state and weakness, she is not safe to return to home unless there is someone who is willing to care for her Consultants:   Neurology  Psychiatry Procedures:   Transthoracic echocardiogram:  1. Left ventricular ejection fraction, by visual estimation, is 60 to 65%. The left ventricle has normal function. Normal left ventricular size. There is mildly increased left ventricular hypertrophy.  2. Elevated mean left atrial pressure.  3. Left ventricular diastolic Doppler parameters are consistent with impaired relaxation pattern of LV diastolic filling.  4. Global right ventricle has normal systolic function.The right ventricular size is normal. No increase in right ventricular wall thickness.  5. Left atrial size was normal.  6. Right atrial size was normal.  7. The mitral valve is normal in structure. No evidence of mitral valve regurgitation. No evidence of mitral stenosis.  8. The tricuspid valve is normal in structure. Tricuspid valve regurgitation was not visualized by color  flow Doppler.  9. The aortic valve is normal in structure. Aortic valve  regurgitation was not visualized by color flow Doppler. Structurally normal aortic valve, with no evidence of sclerosis or stenosis. 10. The pulmonic valve was normal in structure. Pulmonic valve regurgitation is mild by color flow Doppler. 11. The inferior vena cava is normal in size with greater than 50% respiratory variability, suggesting right atrial pressure of 3 mmHg.  Antimicrobials:  Anti-infectives (From admission, onward)   Start     Dose/Rate Route Frequency Ordered Stop   11/03/18 1930  cefTRIAXone (ROCEPHIN) 1 g in sodium chloride 0.9 % 100 mL IVPB  Status:  Discontinued     1 g 200 mL/hr over 30 Minutes Intravenous Every 24 hours 11/03/18 1724 11/06/18 1118   11/03/18 0530  cefTRIAXone (ROCEPHIN) 1 g in sodium chloride 0.9 % 100 mL IVPB     1 g 200 mL/hr over 30 Minutes Intravenous  Once 11/03/18 0525 11/03/18 0629       Objective: Vitals:   01/10/19 0346 01/10/19 0459 01/10/19 0836 01/10/19 1119  BP: 136/63  (!) 131/55 (!) 116/51  Pulse: 74  78 71  Resp: 16  17 17   Temp: 98.1 F (36.7 C)  98.5 F (36.9 C) 99.2 F (37.3 C)  TempSrc: Oral  Oral Oral  SpO2: 98%  99% 98%  Weight:  99.5 kg    Height:        Intake/Output Summary (Last 24 hours) at 01/10/2019 1143 Last data filed at 01/10/2019 0424 Gross per 24 hour  Intake --  Output 900 ml  Net -900 ml   Filed Weights   01/08/19 0500 01/09/19 0500 01/10/19 0459  Weight: 99 kg 99.5 kg 99.5 kg    Examination: General exam: Appears comfortable  HEENT: PERRLA, oral mucosa moist, no sclera icterus or thrush Respiratory system: Clear to auscultation. Respiratory effort normal. Cardiovascular system: S1 & S2 heard,  No murmurs  Gastrointestinal system: Abdomen soft, non-tender, nondistended. Normal bowel sounds   Central nervous system: Alert and oriented. No focal neurological deficits. Extremities: No cyanosis, clubbing or edema Skin: No rashes or ulcers Psychiatry:  Mood & affect appropriate.   Data  Reviewed: I have personally reviewed following labs and imaging studies  CBC: Recent Labs  Lab 01/05/19 0805  WBC 9.4  HGB 12.5  HCT 37.0  MCV 90.7  PLT 123XX123   Basic Metabolic Panel: Recent Labs  Lab 01/05/19 0805 01/07/19 0300  NA 138 139  K 3.2* 3.8  CL 102 105  CO2 25 19*  GLUCOSE 165* 148*  BUN 17 19  CREATININE 0.60 0.54  CALCIUM 9.2 9.2  MG 1.8  --    GFR: Estimated Creatinine Clearance: 84.6 mL/min (by C-G formula based on SCr of 0.54 mg/dL). Liver Function Tests: No results for input(s): AST, ALT, ALKPHOS, BILITOT, PROT, ALBUMIN in the last 168 hours. No results for input(s): LIPASE, AMYLASE in the last 168 hours. No results for input(s): AMMONIA in the last 168 hours. Coagulation Profile: No results for input(s): INR, PROTIME in the last 168 hours. Cardiac Enzymes: No results for input(s): CKTOTAL, CKMB, CKMBINDEX, TROPONINI in the last 168 hours. BNP (last 3 results) No results for input(s): PROBNP in the last 8760 hours. HbA1C: No results for input(s): HGBA1C in the last 72 hours. CBG: Recent Labs  Lab 01/09/19 1139 01/09/19 1610 01/09/19 2134 01/10/19 0605 01/10/19 1118  GLUCAP 172* 101* 127* 159* 150*   Lipid Profile: No results for input(s): CHOL, HDL,  LDLCALC, TRIG, CHOLHDL, LDLDIRECT in the last 72 hours. Thyroid Function Tests: No results for input(s): TSH, T4TOTAL, FREET4, T3FREE, THYROIDAB in the last 72 hours. Anemia Panel: No results for input(s): VITAMINB12, FOLATE, FERRITIN, TIBC, IRON, RETICCTPCT in the last 72 hours. Urine analysis:    Component Value Date/Time   COLORURINE YELLOW 11/03/2018 0341   APPEARANCEUR CLOUDY (A) 11/03/2018 0341   LABSPEC 1.024 11/03/2018 0341   PHURINE 6.0 11/03/2018 0341   GLUCOSEU >=500 (A) 11/03/2018 0341   HGBUR MODERATE (A) 11/03/2018 0341   HGBUR negative 11/01/2008 1121   BILIRUBINUR NEGATIVE 11/03/2018 0341   BILIRUBINUR neg. 12/21/2012 0933   KETONESUR 80 (A) 11/03/2018 0341   PROTEINUR  30 (A) 11/03/2018 0341   UROBILINOGEN 0.2 12/21/2012 0933   UROBILINOGEN 0.2 11/01/2008 1121   NITRITE NEGATIVE 11/03/2018 0341   LEUKOCYTESUR MODERATE (A) 11/03/2018 0341   Sepsis Labs: @LABRCNTIP (procalcitonin:4,lacticidven:4) )No results found for this or any previous visit (from the past 240 hour(s)).       Radiology Studies: No results found.    Scheduled Meds: . amLODipine  10 mg Oral Daily  . atorvastatin  80 mg Oral q1800  . carvedilol  6.25 mg Oral BID WC  . clopidogrel  75 mg Oral Daily  . enoxaparin (LOVENOX) injection  40 mg Subcutaneous Q24H  . feeding supplement  1 Container Oral BID BM  . feeding supplement (PRO-STAT SUGAR FREE 64)  30 mL Oral TID  . insulin aspart  0-15 Units Subcutaneous TID WC  . insulin aspart  0-5 Units Subcutaneous QHS  . insulin aspart  4 Units Subcutaneous TID WC  . insulin glargine  14 Units Subcutaneous QHS  . linagliptin  5 mg Oral Daily  . lisinopril  40 mg Oral Daily  . pantoprazole  40 mg Oral Daily  . sertraline  100 mg Oral Daily  . sodium chloride flush  10-40 mL Intracatheter Q12H  . thiamine  100 mg Oral Daily   Continuous Infusions:   LOS: 68 days   Time spent: 32 minutes spent on chart review, discussion with nursing staff, consultants, updating family and interview/physical exam; more than 50% of that time was spent in counseling and/or coordination of care.    Polk Minor J [place] DO [facility] 01/10/2019, 11:43 AM

## 2019-01-11 LAB — GLUCOSE, CAPILLARY
Glucose-Capillary: 162 mg/dL — ABNORMAL HIGH (ref 70–99)
Glucose-Capillary: 141 mg/dL — ABNORMAL HIGH (ref 70–99)
Glucose-Capillary: 95 mg/dL (ref 70–99)
Glucose-Capillary: 130 mg/dL — ABNORMAL HIGH (ref 70–99)

## 2019-01-11 NOTE — Progress Notes (Signed)
PROGRESS NOTE    Becky Stout   [id number]  DOB: 07/30/1955  DOA: 11/03/2018 PCP: Abner Greenspan, MD   Brief Narrative:  Becky Stout is a 63 year old female with PMH of DM 2, HTN, WPW syndrome, frequent falls, confusion, and weight loss was admitted for DKA, acute CVA and metabolic encephalopathy.  Patient has been medically stable for discharge for several weeks but ongoing prolonged hospital stay due to difficulty in SNF placement.   Subjective: Patient seen and examined at bedside this morning, resting comfortably.  Wants to know when she can discharge home to live with her mother.  No other specific complaints at this time.  Denies headache, no chest pain, palpitations, no shortness of breath, no abdominal pain.  Assessment & Plan:    DKA (diabetic ketoacidoses)- presenting problem This was partly the initial reason for the patient's hospital admission and has resolved.  Hemoglobin A1c 7.9.  Blood sugars currently stable. --Lantus 14 units subcutaneously daily --NovoLog 4 units TIDAC --Tradjenta 5mg  PO daily --CBG's qAC/HS, ISS for further coverage  Acute CVA with left hemiparesis - presenting problem MRI on 10/10 revealed scattered small acute infarcts in both cerebral hemispheres and brainstem;  also noted chronic basal ganglia infarcts and moderately advanced cerebral atrophy. TTE 10/11 with  normal EF, impaired LV diastolic filling suggestive of diastolic heart failure, no thrombus was noted. Carotid ultrasound and transcranial Dopplers were unremarkable. --Neurology signed off 11/05/2018 --Completed 3-week course of dual antiplatelet therapy with Plavix --Continue aspirin 81 mg p.o. daily --Atorvastatin 80 mg p.o. daily -Awaiting skilled nursing facility placement, she continues to work with PT and her left hemiparesis has improved; unsafe discharge home as her mother cannot provide adequate 24-hour care for her at this time.  WPW syndrome -No active  issues  Essential hypertension BP 123/62 this morning, well controlled. --Amlodipine 10 mg p.o. daily --Coreg 6.25 mg p.o. twice daily --Lisinopril 40 mg p.o. daily --Hydralazine 10 mg p.o. q8h prn SBP >160  Anxiety/depression and agoraphobia Cognitive decline Cognitive decline noted by family even prior hospital admission her family had concerns about her ability to continue to live alone and she has poor insight into her condition --evaluated by psychiatry and started on Zoloft for psychiatric concerns --Zoloft increased to 100 mg daily on 12/11  Hypokalemia and hypomagnesemia -Have been adequately replaced  GERD: Continue Protonix 40 mg p.o. daily  Obesity -Body mass index is 37.65 kg/m.     DVT prophylaxis: Lovenox Code Status: DO NOT RESUSCITATE Family Communication:  Disposition Plan: awaiting skilled nursing placement. Due to her cognitive state and weakness, she is not safe to return to home unless there is someone who is willing to care for her Consultants:   Neurology  Psychiatry Procedures:   Transthoracic echocardiogram:  1. Left ventricular ejection fraction, by visual estimation, is 60 to 65%. The left ventricle has normal function. Normal left ventricular size. There is mildly increased left ventricular hypertrophy.  2. Elevated mean left atrial pressure.  3. Left ventricular diastolic Doppler parameters are consistent with impaired relaxation pattern of LV diastolic filling.  4. Global right ventricle has normal systolic function.The right ventricular size is normal. No increase in right ventricular wall thickness.  5. Left atrial size was normal.  6. Right atrial size was normal.  7. The mitral valve is normal in structure. No evidence of mitral valve regurgitation. No evidence of mitral stenosis.  8. The tricuspid valve is normal in structure. Tricuspid valve regurgitation was not visualized by  color flow Doppler.  9. The aortic valve is normal in  structure. Aortic valve regurgitation was not visualized by color flow Doppler. Structurally normal aortic valve, with no evidence of sclerosis or stenosis. 10. The pulmonic valve was normal in structure. Pulmonic valve regurgitation is mild by color flow Doppler. 11. The inferior vena cava is normal in size with greater than 50% respiratory variability, suggesting right atrial pressure of 3 mmHg.  Antimicrobials:  Anti-infectives (From admission, onward)   Start     Dose/Rate Route Frequency Ordered Stop   11/03/18 1930  cefTRIAXone (ROCEPHIN) 1 g in sodium chloride 0.9 % 100 mL IVPB  Status:  Discontinued     1 g 200 mL/hr over 30 Minutes Intravenous Every 24 hours 11/03/18 1724 11/06/18 1118   11/03/18 0530  cefTRIAXone (ROCEPHIN) 1 g in sodium chloride 0.9 % 100 mL IVPB     1 g 200 mL/hr over 30 Minutes Intravenous  Once 11/03/18 0525 11/03/18 0629       Objective: Vitals:   01/10/19 2344 01/11/19 0317 01/11/19 0800 01/11/19 0834  BP: 113/60 (!) 132/59 128/72 (!) 118/51  Pulse: 74 70 70 73  Resp:    17  Temp: 98 F (36.7 C) 98.3 F (36.8 C)  98.5 F (36.9 C)  TempSrc: Oral Oral    SpO2: 97% 99%  98%  Weight:      Height:        Intake/Output Summary (Last 24 hours) at 01/11/2019 1009 Last data filed at 01/11/2019 0049 Gross per 24 hour  Intake 540 ml  Output 500 ml  Net 40 ml   Filed Weights   01/08/19 0500 01/09/19 0500 01/10/19 0459  Weight: 99 kg 99.5 kg 99.5 kg    Examination: General exam: Appears comfortable  HEENT: PERRLA, oral mucosa moist, no sclera icterus or thrush Respiratory system: Clear to auscultation. Respiratory effort normal. Cardiovascular system: S1 & S2 heard,  No murmurs  Gastrointestinal system: Abdomen soft, non-tender, nondistended. Normal bowel sounds   Central nervous system: Alert and oriented. No focal neurological deficits. Extremities: No cyanosis, clubbing or edema Skin: No rashes or ulcers Psychiatry:  Mood & affect  appropriate.  Poor insight.  Data Reviewed: I have personally reviewed following labs and imaging studies  CBC: Recent Labs  Lab 01/05/19 0805  WBC 9.4  HGB 12.5  HCT 37.0  MCV 90.7  PLT 123XX123   Basic Metabolic Panel: Recent Labs  Lab 01/05/19 0805 01/07/19 0300  NA 138 139  K 3.2* 3.8  CL 102 105  CO2 25 19*  GLUCOSE 165* 148*  BUN 17 19  CREATININE 0.60 0.54  CALCIUM 9.2 9.2  MG 1.8  --    GFR: Estimated Creatinine Clearance: 84.6 mL/min (by C-G formula based on SCr of 0.54 mg/dL). Liver Function Tests: No results for input(s): AST, ALT, ALKPHOS, BILITOT, PROT, ALBUMIN in the last 168 hours. No results for input(s): LIPASE, AMYLASE in the last 168 hours. No results for input(s): AMMONIA in the last 168 hours. Coagulation Profile: No results for input(s): INR, PROTIME in the last 168 hours. Cardiac Enzymes: No results for input(s): CKTOTAL, CKMB, CKMBINDEX, TROPONINI in the last 168 hours. BNP (last 3 results) No results for input(s): PROBNP in the last 8760 hours. HbA1C: No results for input(s): HGBA1C in the last 72 hours. CBG: Recent Labs  Lab 01/10/19 0605 01/10/19 1118 01/10/19 1725 01/10/19 2123 01/11/19 0617  GLUCAP 159* 150* 226* 166* 162*   Lipid Profile: No results for  input(s): CHOL, HDL, LDLCALC, TRIG, CHOLHDL, LDLDIRECT in the last 72 hours. Thyroid Function Tests: No results for input(s): TSH, T4TOTAL, FREET4, T3FREE, THYROIDAB in the last 72 hours. Anemia Panel: No results for input(s): VITAMINB12, FOLATE, FERRITIN, TIBC, IRON, RETICCTPCT in the last 72 hours. Urine analysis:    Component Value Date/Time   COLORURINE YELLOW 11/03/2018 0341   APPEARANCEUR CLOUDY (A) 11/03/2018 0341   LABSPEC 1.024 11/03/2018 0341   PHURINE 6.0 11/03/2018 0341   GLUCOSEU >=500 (A) 11/03/2018 0341   HGBUR MODERATE (A) 11/03/2018 0341   HGBUR negative 11/01/2008 1121   BILIRUBINUR NEGATIVE 11/03/2018 0341   BILIRUBINUR neg. 12/21/2012 0933   KETONESUR  80 (A) 11/03/2018 0341   PROTEINUR 30 (A) 11/03/2018 0341   UROBILINOGEN 0.2 12/21/2012 0933   UROBILINOGEN 0.2 11/01/2008 1121   NITRITE NEGATIVE 11/03/2018 0341   LEUKOCYTESUR MODERATE (A) 11/03/2018 0341   Sepsis Labs: @LABRCNTIP (procalcitonin:4,lacticidven:4) )No results found for this or any previous visit (from the past 240 hour(s)).       Radiology Studies: No results found.    Scheduled Meds: . amLODipine  10 mg Oral Daily  . atorvastatin  80 mg Oral q1800  . carvedilol  6.25 mg Oral BID WC  . clopidogrel  75 mg Oral Daily  . enoxaparin (LOVENOX) injection  40 mg Subcutaneous Q24H  . feeding supplement  1 Container Oral BID BM  . feeding supplement (PRO-STAT SUGAR FREE 64)  30 mL Oral TID  . insulin aspart  0-15 Units Subcutaneous TID WC  . insulin aspart  0-5 Units Subcutaneous QHS  . insulin aspart  4 Units Subcutaneous TID WC  . insulin glargine  14 Units Subcutaneous QHS  . linagliptin  5 mg Oral Daily  . lisinopril  40 mg Oral Daily  . pantoprazole  40 mg Oral Daily  . sertraline  100 mg Oral Daily  . sodium chloride flush  10-40 mL Intracatheter Q12H  . thiamine  100 mg Oral Daily   Continuous Infusions:   LOS: 69 days   Time spent: 32 minutes spent on chart review, discussion with nursing staff, consultants, updating family and interview/physical exam; more than 50% of that time was spent in counseling and/or coordination of care.    Meric Joye J [place] DO [facility] 01/11/2019, 10:09 AM

## 2019-01-12 LAB — GLUCOSE, CAPILLARY
Glucose-Capillary: 164 mg/dL — ABNORMAL HIGH (ref 70–99)
Glucose-Capillary: 159 mg/dL — ABNORMAL HIGH (ref 70–99)
Glucose-Capillary: 113 mg/dL — ABNORMAL HIGH (ref 70–99)
Glucose-Capillary: 175 mg/dL — ABNORMAL HIGH (ref 70–99)

## 2019-01-12 LAB — BASIC METABOLIC PANEL
Anion gap: 11 (ref 5–15)
BUN: 18 mg/dL (ref 8–23)
CO2: 23 mmol/L (ref 22–32)
Calcium: 9 mg/dL (ref 8.9–10.3)
Chloride: 105 mmol/L (ref 98–111)
Creatinine, Ser: 0.53 mg/dL (ref 0.44–1.00)
GFR calc Af Amer: >60 mL/min (ref >60)
GFR calc non Af Amer: >60 mL/min (ref >60)
Glucose, Bld: 167 mg/dL — ABNORMAL HIGH (ref 70–99)
Potassium: 3.4 mmol/L — ABNORMAL LOW (ref 3.5–5.1)
Sodium: 139 mmol/L (ref 135–145)

## 2019-01-12 LAB — CBC
HCT: 37.3 % (ref 36.0–46.0)
Hemoglobin: 12.3 g/dL (ref 12.0–15.0)
MCH: 30.5 pg (ref 26.0–34.0)
MCHC: 33 g/dL (ref 30.0–36.0)
MCV: 92.6 fL (ref 80.0–100.0)
Platelets: 287 10*3/uL (ref 150–400)
RBC: 4.03 MIL/uL (ref 3.87–5.11)
RDW: 13 % (ref 11.5–15.5)
WBC: 9.7 10*3/uL (ref 4.0–10.5)
nRBC: 0 % (ref 0.0–0.2)

## 2019-01-12 LAB — MAGNESIUM: Magnesium: 1.7 mg/dL (ref 1.7–2.4)

## 2019-01-12 NOTE — Progress Notes (Signed)
PROGRESS NOTE  Becky Stout [id number] DOB: 04/11/1955 DOA: 11/03/2018 PCP: Abner Greenspan, MD  HPI/Recap of past 18 hours: 63 year old female with past medical history of diabetes, hypertension, WPW syndrome, frequent falls confusion, acute CVA and metabolic encephalopathy..  Patient is waiting for nursing home placement.  Patient is said to not be participating in activities refusing to get up out of bed and just not wanting to do anything.  Subjective: Patient seen and examined at bedside her sister Morey Hummingbird was at bedside.  Patient denies any complaint she stated that she works and unable to take care of patient and if patient is discharged home will be going to stay with her 69 year old mother who cannot take care of herself self for this patient to be discharged home she has to be able to take care of herself.  Assessment/Plan: Principal Problem:   DKA (diabetic ketoacidoses) (HCC) Active Problems:   Hypothyroidism   B12 deficiency   WOLFF (WOLFE)-PARKINSON-WHITE (WPW) SYNDROME   Essential hypertension   Hyperlipidemia   Poorly controlled type 2 diabetes mellitus (HCC)   Anxiety   Failure to thrive in adult   Acute lower UTI   High anion gap metabolic acidosis   Cerebral thrombosis with cerebral infarction #1 essential hypertension stable continue amlodipine Coreg lisinopril and hydralazine.  2.  Morbid obesity BMI 37.6 patient does not want to participate in activities like getting out of bed.  3.  GERD continue Protonix p.o. daily  4.  Anxiety depression patient has been evaluated by psychiatry continue Zoloft which was increased to 200 mg on December 11  5.  Acute CVA with left hemiparesis.  Neurology evaluated and signed off already patient has completed 3 weeks of dual antiplatelet therapy with Plavix continue aspirin 81 mg daily continue atorvastatin 80 mg at bedtime awaiting skilled nursing facility transfer  6.  DKA this was the presenting problem but this has  resolved continue current blood pressure control with Lovenox and Lantus and Tradjenta  Code Status: DNR  Severity of Illness: The appropriate patient status for this patient is INPATIENT. Inpatient status is judged to be reasonable and necessary in order to provide the required intensity of service to ensure the patient's safety. The patient's presenting symptoms, physical exam findings, and initial radiographic and laboratory data in the context of their chronic comorbidities is felt to place them at high risk for further clinical deterioration. Furthermore, it is not anticipated that the patient will be medically stable for discharge from the hospital within 2 midnights of admission. The following factors support the patient status of inpatient.   Waiting for an nursing home placement patient is having difficulty being placed  * I certify that at the point of admission it is my clinical judgment that the patient will require inpatient hospital care spanning beyond 2 midnights from the point of admission due to high intensity of service, high risk for further deterioration and high frequency of surveillance required.*    Family Communication: Spoke with Morey Hummingbird at the bedside her sister  Disposition Plan: Discussed discharge planning with her Aundria Mems at bedside.  Patient is waiting for SNF   Consultants:  Neurology  Psychiatry  Procedures:  None  Antimicrobials:  None  DVT prophylaxis: Lovenox   Objective: Vitals:   01/11/19 2042 01/12/19 0024 01/12/19 0426 01/12/19 0814  BP: (!) 110/46 (!) 137/58 (!) 121/55 (!) 133/54  Pulse: 69 77 70 78  Resp: 18 18 16 12   Temp:  98.2 F (36.8 C)  98 F (36.7 C) 98.8 F (37.1 C)  TempSrc:  Oral Oral Oral  SpO2: 95% 99% 99% 100%  Weight:      Height:        Intake/Output Summary (Last 24 hours) at 01/12/2019 0914 Last data filed at 01/12/2019 0341 Gross per 24 hour  Intake --  Output 200 ml  Net -200 ml   Filed Weights    01/08/19 0500 01/09/19 0500 01/10/19 0459  Weight: 99 kg 99.5 kg 99.5 kg   Body mass index is 37.65 kg/m.  Exam:  . General: 63 y.o. year-old female well developed well nourished in no acute distress.  Alert and oriented x3.  Obese no distress . Cardiovascular: Regular rate and rhythm with no rubs or gallops.  No thyromegaly or JVD noted.   [name] Respiratory: Clear to auscultation with no wheezes or rales. Good inspiratory effort. . Abdomen: Soft nontender nondistended with normal bowel sounds x4 quadrants. . Musculoskeletal: No lower extremity edema. 2/4 pulses in all 4 extremities. . Skin: No ulcerative lesions noted or rashes, . Psychiatry: Mood is appropriate for condition and setting    Data Reviewed: CBC: Recent Labs  Lab 01/12/19 0535  WBC 9.7  HGB 12.3  HCT 37.3  MCV 92.6  PLT [id number]   Basic Metabolic Panel: Recent Labs  Lab 01/07/19 0300 01/12/19 0535  NA 139 139  K 3.8 3.4*  CL 105 105  CO2 19* 23  GLUCOSE 148* 167*  BUN 19 18  CREATININE 0.54 0.53  CALCIUM 9.2 9.0  MG  --  1.7   GFR: Estimated Creatinine Clearance: 84.6 mL/min (by C-G formula based on SCr of 0.53 mg/dL). Liver Function Tests: No results for input(s): AST, ALT, ALKPHOS, BILITOT, PROT, ALBUMIN in the last 168 hours. No results for input(s): LIPASE, AMYLASE in the last 168 hours. No results for input(s): AMMONIA in the last 168 hours. Coagulation Profile: No results for input(s): INR, PROTIME in the last 168 hours. Cardiac Enzymes: No results for input(s): CKTOTAL, CKMB, CKMBINDEX, TROPONINI in the last 168 hours. BNP (last 3 results) No results for input(s): PROBNP in the last 8760 hours. HbA1C: No results for input(s): HGBA1C in the last 72 hours. CBG: Recent Labs  Lab 01/11/19 0617 01/11/19 1124 01/11/19 1706 01/11/19 2152 01/12/19 0610  GLUCAP 162* 141* 95 130* 164*   Lipid Profile: No results for input(s): CHOL, HDL, LDLCALC, TRIG, CHOLHDL, LDLDIRECT in the last 72  hours. Thyroid Function Tests: No results for input(s): TSH, T4TOTAL, FREET4, T3FREE, THYROIDAB in the last 72 hours. Anemia Panel: No results for input(s): VITAMINB12, FOLATE, FERRITIN, TIBC, IRON, RETICCTPCT in the last 72 hours. Urine analysis:    Component Value Date/Time   COLORURINE YELLOW 11/03/2018 0341   APPEARANCEUR CLOUDY (A) 11/03/2018 0341   LABSPEC 1.024 11/03/2018 0341   PHURINE 6.0 11/03/2018 0341   GLUCOSEU >=500 (A) 11/03/2018 0341   HGBUR MODERATE (A) 11/03/2018 0341   HGBUR negative 11/01/2008 1121   BILIRUBINUR NEGATIVE 11/03/2018 0341   BILIRUBINUR neg. 12/21/2012 0933   KETONESUR 80 (A) 11/03/2018 0341   PROTEINUR 30 (A) 11/03/2018 0341   UROBILINOGEN 0.2 12/21/2012 0933   UROBILINOGEN 0.2 11/01/2008 1121   NITRITE NEGATIVE 11/03/2018 0341   LEUKOCYTESUR MODERATE (A) 11/03/2018 0341   Sepsis Labs: @LABRCNTIP (procalcitonin:4,lacticidven:4)  )No results found for this or any previous visit (from the past 240 hour(s)).    Studies: No results found.  Scheduled Meds: . amLODipine  10 mg Oral Daily  . atorvastatin  80  mg Oral q1800  . carvedilol  6.25 mg Oral BID WC  . clopidogrel  75 mg Oral Daily  . enoxaparin (LOVENOX) injection  40 mg Subcutaneous Q24H  . feeding supplement  1 Container Oral BID BM  . feeding supplement (PRO-STAT SUGAR FREE 64)  30 mL Oral TID  . insulin aspart  0-15 Units Subcutaneous TID WC  . insulin aspart  0-5 Units Subcutaneous QHS  . insulin aspart  4 Units Subcutaneous TID WC  . insulin glargine  14 Units Subcutaneous QHS  . linagliptin  5 mg Oral Daily  . lisinopril  40 mg Oral Daily  . pantoprazole  40 mg Oral Daily  . sertraline  100 mg Oral Daily  . sodium chloride flush  10-40 mL Intracatheter Q12H  . thiamine  100 mg Oral Daily    Continuous Infusions:   LOS: 70 days     Cristal Deer, MD [facility]  To reach me or the doctor on call, go to: [email or web address] Password [email or web address]  01/12/2019, 9:14 AM

## 2019-01-12 NOTE — Plan of Care (Signed)
  Problem: Activity: Goal: Risk for activity intolerance will decrease Outcome: Progressing   

## 2019-01-13 LAB — GLUCOSE, CAPILLARY
Glucose-Capillary: 177 mg/dL — ABNORMAL HIGH (ref 70–99)
Glucose-Capillary: 152 mg/dL — ABNORMAL HIGH (ref 70–99)
Glucose-Capillary: 141 mg/dL — ABNORMAL HIGH (ref 70–99)

## 2019-01-13 NOTE — Progress Notes (Addendum)
PROGRESS NOTE  Becky Stout [id number] DOB: [birth year] DOA: 11/03/2018 PCP: Abner Greenspan, MD  HPI/Recap of past 92 hours: 63 year old female with past medical history of diabetes, hypertension, WPW syndrome, frequent falls confusion, acute CVA and metabolic encephalopathy..  Patient is waiting for nursing home placement.  Patient is said to not be participating in activities refusing to get up out of bed and just not wanting to do anything.  Subjective: Patient seen and examined at bedside.  Her sister Morey Hummingbird was at bedside.  Patient denies any complaint.  Her assisted stated that she works and unable to take care of patient and if patient is discharged home will be going to stay with her 6 year old mother who cannot take care of herself and for this patient to be discharged home she has to be able to take care of herself.  January 13, 2019. Subjective: Patient seen and examined at bedside he denies any new complaint she was lying in bed.  Assessment/Plan: Principal Problem:   DKA (diabetic ketoacidoses) (HCC) Active Problems:   Hypothyroidism   B12 deficiency   WOLFF (WOLFE)-PARKINSON-WHITE (WPW) SYNDROME   Essential hypertension   Hyperlipidemia   Poorly controlled type 2 diabetes mellitus (HCC)   Anxiety   Failure to thrive in adult   Acute lower UTI   High anion gap metabolic acidosis   Cerebral thrombosis with cerebral infarction #1 essential hypertension stable continue amlodipine Coreg lisinopril and hydralazine.  2.  Morbid obesity BMI 37.6 patient does not want to participate in activities like getting out of bed.  3.  GERD continue Protonix p.o. daily  4.  Anxiety depression patient has been evaluated by psychiatry continue Zoloft which was increased to 200 mg on December 11  5.  Acute CVA with left hemiparesis.  Neurology evaluated and signed off already patient has completed 3 weeks of dual antiplatelet therapy with Plavix continue aspirin 81 mg daily  continue atorvastatin 80 mg at bedtime awaiting skilled nursing facility transfer  6.  DKA this was the presenting problem but this has resolved continue current blood pressure control with Lovenox and Lantus and Tradjenta  Code Status: DNR  Severity of Illness: The appropriate patient status for this patient is INPATIENT. Inpatient status is judged to be reasonable and necessary in order to provide the required intensity of service to ensure the patient's safety. The patient's presenting symptoms, physical exam findings, and initial radiographic and laboratory data in the context of their chronic comorbidities is felt to place them at high risk for further clinical deterioration. Furthermore, it is not anticipated that the patient will be medically stable for discharge from the hospital within 2 midnights of admission. The following factors support the patient status of inpatient.   Waiting for an nursing home placement patient is having difficulty being placed  * I certify that at the point of admission it is my clinical judgment that the patient will require inpatient hospital care spanning beyond 2 midnights from the point of admission due to high intensity of service, high risk for further deterioration and high frequency of surveillance required.*    Family Communication: Spoke with Morey Hummingbird at the bedside her sister  Disposition Plan: Discussed discharge planning with her Aundria Mems at bedside.  Patient is waiting for SNF   Consultants:  Neurology  Psychiatry  Procedures:  None  Antimicrobials:  None  DVT prophylaxis: Lovenox   Objective: Vitals:   01/13/19 0400 01/13/19 0820 01/13/19 1148 01/13/19 1552  BP:  (!) 137/58  137/63 (!) 124/52  Pulse:  74 72 61  Resp:  12 16 17   Temp:  98.8 F (37.1 C) 98.3 F (36.8 C) 98.7 F (37.1 C)  TempSrc:  Oral Oral Oral  SpO2:  99% 99% 99%  Weight: 99 kg     Height:        Intake/Output Summary (Last 24 hours) at  01/13/2019 1621 Last data filed at 01/13/2019 0834 Gross per 24 hour  Intake 120 ml  Output 900 ml  Net -780 ml   Filed Weights   01/09/19 0500 01/10/19 0459 01/13/19 0400  Weight: 99.5 kg 99.5 kg 99 kg   Body mass index is 37.46 kg/m.  Exam:  . General: 63 y.o. year-old female well developed well nourished in no acute distress.  Alert and oriented x3.  Obese no distress . Cardiovascular: Regular rate and rhythm with no rubs or gallops.  No thyromegaly or JVD noted.   [name] Respiratory: Clear to auscultation with no wheezes or rales. Good inspiratory effort. . Abdomen: Soft nontender nondistended with normal bowel sounds x4 quadrants. . Musculoskeletal: No lower extremity edema. 2/4 pulses in all 4 extremities. . Skin: No ulcerative lesions noted or rashes, . Psychiatry: Mood is appropriate for condition and setting    Data Reviewed: CBC: Recent Labs  Lab 01/12/19 0535  WBC 9.7  HGB 12.3  HCT 37.3  MCV 92.6  PLT [id number]   Basic Metabolic Panel: Recent Labs  Lab 01/07/19 0300 01/12/19 0535  NA 139 139  K 3.8 3.4*  CL 105 105  CO2 19* 23  GLUCOSE 148* 167*  BUN 19 18  CREATININE 0.54 0.53  CALCIUM 9.2 9.0  MG  --  1.7   GFR: Estimated Creatinine Clearance: 84.4 mL/min (by C-G formula based on SCr of 0.53 mg/dL). Liver Function Tests: No results for input(s): AST, ALT, ALKPHOS, BILITOT, PROT, ALBUMIN in the last 168 hours. No results for input(s): LIPASE, AMYLASE in the last 168 hours. No results for input(s): AMMONIA in the last 168 hours. Coagulation Profile: No results for input(s): INR, PROTIME in the last 168 hours. Cardiac Enzymes: No results for input(s): CKTOTAL, CKMB, CKMBINDEX, TROPONINI in the last 168 hours. BNP (last 3 results) No results for input(s): PROBNP in the last 8760 hours. HbA1C: No results for input(s): HGBA1C in the last 72 hours. CBG: Recent Labs  Lab 01/12/19 1141 01/12/19 1622 01/12/19 2129 01/13/19 0556 01/13/19 1551  GLUCAP  159* 113* 175* 177* 152*   Lipid Profile: No results for input(s): CHOL, HDL, LDLCALC, TRIG, CHOLHDL, LDLDIRECT in the last 72 hours. Thyroid Function Tests: No results for input(s): TSH, T4TOTAL, FREET4, T3FREE, THYROIDAB in the last 72 hours. Anemia Panel: No results for input(s): VITAMINB12, FOLATE, FERRITIN, TIBC, IRON, RETICCTPCT in the last 72 hours. Urine analysis:    Component Value Date/Time   COLORURINE YELLOW 11/03/2018 0341   APPEARANCEUR CLOUDY (A) 11/03/2018 0341   LABSPEC 1.024 11/03/2018 0341   PHURINE 6.0 11/03/2018 0341   GLUCOSEU >=500 (A) 11/03/2018 0341   HGBUR MODERATE (A) 11/03/2018 0341   HGBUR negative 11/01/2008 1121   BILIRUBINUR NEGATIVE 11/03/2018 0341   BILIRUBINUR neg. 12/21/2012 0933   KETONESUR 80 (A) 11/03/2018 0341   PROTEINUR 30 (A) 11/03/2018 0341   UROBILINOGEN 0.2 12/21/2012 0933   UROBILINOGEN 0.2 11/01/2008 1121   NITRITE NEGATIVE 11/03/2018 0341   LEUKOCYTESUR MODERATE (A) 11/03/2018 0341   Sepsis Labs: @LABRCNTIP (procalcitonin:4,lacticidven:4)  )No results found for this or any previous visit (from the past  240 hour(s)).    Studies: No results found.  Scheduled Meds: . amLODipine  10 mg Oral Daily  . atorvastatin  80 mg Oral q1800  . carvedilol  6.25 mg Oral BID WC  . clopidogrel  75 mg Oral Daily  . enoxaparin (LOVENOX) injection  40 mg Subcutaneous Q24H  . feeding supplement  1 Container Oral BID BM  . feeding supplement (PRO-STAT SUGAR FREE 64)  30 mL Oral TID  . insulin aspart  0-15 Units Subcutaneous TID WC  . insulin aspart  0-5 Units Subcutaneous QHS  . insulin aspart  4 Units Subcutaneous TID WC  . insulin glargine  14 Units Subcutaneous QHS  . linagliptin  5 mg Oral Daily  . lisinopril  40 mg Oral Daily  . pantoprazole  40 mg Oral Daily  . sertraline  100 mg Oral Daily  . sodium chloride flush  10-40 mL Intracatheter Q12H  . thiamine  100 mg Oral Daily    Continuous Infusions:   LOS: 71 days      Cristal Deer, MD [facility]  To reach me or the doctor on call, go to: [email or web address] Password [email or web address]  01/13/2019, 4:21 PM

## 2019-01-13 NOTE — Plan of Care (Signed)
  Problem: Education: Goal: Knowledge of General Education information will improve Description: Including pain rating scale, medication(s)/side effects and non-pharmacologic comfort measures Outcome: Progressing   Problem: Health Behavior/Discharge Planning: Goal: Ability to manage health-related needs will improve Outcome: Progressing   Problem: Clinical Measurements: Goal: Ability to maintain clinical measurements within normal limits will improve Outcome: Progressing Goal: Will remain free from infection Outcome: Progressing Goal: Diagnostic test results will improve Outcome: Progressing Goal: Respiratory complications will improve Outcome: Progressing Goal: Cardiovascular complication will be avoided Outcome: Progressing   Problem: Activity: Goal: Risk for activity intolerance will decrease Outcome: Progressing   Problem: Nutrition: Goal: Adequate nutrition will be maintained Outcome: Progressing   Problem: Coping: Goal: Level of anxiety will decrease Outcome: Progressing   Problem: Elimination: Goal: Will not experience complications related to bowel motility Outcome: Progressing Goal: Will not experience complications related to urinary retention Outcome: Progressing   Problem: Pain Managment: Goal: General experience of comfort will improve Outcome: Progressing   Problem: Safety: Goal: Ability to remain free from injury will improve Outcome: Progressing   Problem: Skin Integrity: Goal: Risk for impaired skin integrity will decrease Outcome: Progressing   Problem: Education: Goal: Knowledge of secondary prevention will improve Outcome: Progressing Goal: Knowledge of patient specific risk factors addressed and post discharge goals established will improve Outcome: Progressing Goal: Individualized Educational Video(s) Outcome: Progressing   Problem: Coping: Goal: Will verbalize positive feelings about self Outcome: Progressing Goal: Will identify  appropriate support needs Outcome: Progressing   Problem: Self-Care: Goal: Ability to participate in self-care as condition permits will improve Outcome: Progressing Goal: Verbalization of feelings and concerns over difficulty with self-care will improve Outcome: Progressing   Problem: Nutrition: Goal: Dietary intake will improve Outcome: Progressing   Problem: Ischemic Stroke/TIA Tissue Perfusion: Goal: Complications of ischemic stroke/TIA will be minimized Outcome: Progressing   Ival Bible, BSN, RN

## 2019-01-13 NOTE — Plan of Care (Signed)
  Problem: Pain Managment: Goal: General experience of comfort will improve Outcome: Progressing   

## 2019-01-14 DIAGNOSIS — R5381 Other malaise: Secondary | ICD-10-CM

## 2019-01-14 DIAGNOSIS — R4189 Other symptoms and signs involving cognitive functions and awareness: Secondary | ICD-10-CM

## 2019-01-14 LAB — GLUCOSE, CAPILLARY
Glucose-Capillary: 194 mg/dL — ABNORMAL HIGH (ref 70–99)
Glucose-Capillary: 206 mg/dL — ABNORMAL HIGH (ref 70–99)
Glucose-Capillary: 222 mg/dL — ABNORMAL HIGH (ref 70–99)
Glucose-Capillary: 100 mg/dL — ABNORMAL HIGH (ref 70–99)
Glucose-Capillary: 143 mg/dL — ABNORMAL HIGH (ref 70–99)

## 2019-01-14 NOTE — Progress Notes (Signed)
PROGRESS NOTE  Becky Stout [id number] DOB: 1955-12-03   PCP: Abner Greenspan, MD  Patient is from: home  DOA: 11/03/2018 LOS: 4  Brief Narrative / Interim history: 63 year old female with PMH of DM 2, HTN, WPW syndrome, frequent falls, confusion, and weight loss was admitted for DKA, acute CVA and metabolic encephalopathy. Patient has been medically stable for discharge for several weeks but ongoing prolonged hospital stay due to difficulty in SNF placement.  Subjective: No major events overnight of this morning.  No complaints.  Oriented x3 (self, place and time).  Objective: Vitals:   01/14/19 0401 01/14/19 0807 01/14/19 1214 01/14/19 1600  BP:  124/61 (!) 114/57 116/70  Pulse:  73 75 78  Resp:  19 17 18   Temp:  98.3 F (36.8 C) 98.6 F (37 C) 98.2 F (36.8 C)  TempSrc:  Oral Oral Oral  SpO2:  97% 96% 96%  Weight: 98 kg     Height:        Intake/Output Summary (Last 24 hours) at 01/14/2019 1700 Last data filed at 01/14/2019 1212 Gross per 24 hour  Intake 480 ml  Output --  Net 480 ml   Filed Weights   01/10/19 0459 01/13/19 0400 01/14/19 0401  Weight: 99.5 kg 99 kg 98 kg    Examination:  GENERAL: No acute distress.  Appears well.  HEENT: MMM.  Vision and hearing grossly intact.  NECK: Supple.  No apparent JVD.  RESP:  No IWOB. Good air movement bilaterally. CVS:  RRR. Heart sounds normal.  ABD/GI/GU: Bowel sounds present. Soft. Non tender.  MSK/EXT:  No apparent deformity or edema. Moves extremities. SKIN: no apparent skin lesion or wound NEURO: Awake, alert and oriented appropriately.  Motor 4/5 throughout. PSYCH: Calm. Normal affect.  Limited insight.  Procedures:  None  Assessment & Plan: DKA (diabetic ketoacidoses)-DKA resolved.  A1c 7.9%..  Blood sugars currently stable. Recent Labs    01/14/19 0602 01/14/19 1216 01/14/19 1621  GLUCAP 206* 222* 100*  -Continue Lantus 14 units daily, NovoLog 4 units AC, Tradjenta and SSI. -Continue  statin.  Acute CVA with left hemiparesis - presenting problem.  Left hemiparesis improved. MRI on 10/10 revealed scattered small acute infarcts in both cerebral hemispheres and brainstem;  also noted chronic basal ganglia infarcts and moderately advanced cerebral atrophy. TTE 10/11 with  normal EF, impaired LV diastolic filling suggestive of diastolic heart failure, no thrombus was noted. Carotid ultrasound and transcranial Dopplers were unremarkable. -Neurology signed off 11/05/2018 -Continue Plavix.  Completed 3-week course of DAPT with Plavix. -Continue atorvastatin 80 mg p.o. daily -Awaiting SNF.   WPW syndrome?  Reviewed her EKGs but did not see this. -No active issues  Essential hypertension: Normotensive -On amlodipine, Coreg, lisinopril and as needed hydralazine.  Anxiety/depression and agoraphobia Cognitive decline: PO -evaluated by psychiatry and started on Zoloft for psychiatric concerns -Zoloft increased to 100 mg daily on 12/11  Hypokalemia and hypomagnesemia: Resolved.  GERD: Continue Protonix 40 mg p.o. daily  Obesity: Body mass index is 37.65 kg/m.      Nutrition Problem: Inadequate oral intake Etiology: poor appetite  Signs/Symptoms: per patient/family report  Interventions: Prostat, Boost Breeze   DVT prophylaxis: Subcu Lovenox Code Status: DNR/DNI Family Communication: Patient and/or Therapist, sports. Disposition Plan: SNF Consultants: Neurology (off)   Microbiology summarized: 10/10-COVID-19 negative. 10/10-urine culture with multiple species. 10/11-urine culture negative. 10/10 blood cultures negative.  Sch Meds:  Scheduled Meds: . amLODipine  10 mg Oral Daily  . atorvastatin  80 mg Oral  q1800  . carvedilol  6.25 mg Oral BID WC  . clopidogrel  75 mg Oral Daily  . enoxaparin (LOVENOX) injection  40 mg Subcutaneous Q24H  . feeding supplement  1 Container Oral BID BM  . feeding supplement (PRO-STAT SUGAR FREE 64)  30 mL Oral TID  . insulin aspart   0-15 Units Subcutaneous TID WC  . insulin aspart  0-5 Units Subcutaneous QHS  . insulin aspart  4 Units Subcutaneous TID WC  . insulin glargine  14 Units Subcutaneous QHS  . linagliptin  5 mg Oral Daily  . lisinopril  40 mg Oral Daily  . pantoprazole  40 mg Oral Daily  . sertraline  100 mg Oral Daily  . sodium chloride flush  10-40 mL Intracatheter Q12H  . thiamine  100 mg Oral Daily   Continuous Infusions: PRN Meds:.acetaminophen, hydrALAZINE, ondansetron (ZOFRAN) IV, sodium chloride flush  Antimicrobials: Anti-infectives (From admission, onward)   Start     Dose/Rate Route Frequency Ordered Stop   11/03/18 1930  cefTRIAXone (ROCEPHIN) 1 g in sodium chloride 0.9 % 100 mL IVPB  Status:  Discontinued     1 g 200 mL/hr over 30 Minutes Intravenous Every 24 hours 11/03/18 1724 11/06/18 1118   11/03/18 0530  cefTRIAXone (ROCEPHIN) 1 g in sodium chloride 0.9 % 100 mL IVPB     1 g 200 mL/hr over 30 Minutes Intravenous  Once 11/03/18 0525 11/03/18 0629       I have personally reviewed the following labs and images: CBC: Recent Labs  Lab 01/12/19 0535  WBC 9.7  HGB 12.3  HCT 37.3  MCV 92.6  PLT 287   BMP &GFR Recent Labs  Lab 01/12/19 0535  NA 139  K 3.4*  CL 105  CO2 23  GLUCOSE 167*  BUN 18  CREATININE 0.53  CALCIUM 9.0  MG 1.7   Estimated Creatinine Clearance: 83.9 mL/min (by C-G formula based on SCr of 0.53 mg/dL). Liver & Pancreas: No results for input(s): AST, ALT, ALKPHOS, BILITOT, PROT, ALBUMIN in the last 168 hours. No results for input(s): LIPASE, AMYLASE in the last 168 hours. No results for input(s): AMMONIA in the last 168 hours. Diabetic: No results for input(s): HGBA1C in the last 72 hours. Recent Labs  Lab 01/13/19 1551 01/13/19 2119 01/14/19 0602 01/14/19 1216 01/14/19 1621  GLUCAP 152* 141* 206* 222* 100*   Cardiac Enzymes: No results for input(s): CKTOTAL, CKMB, CKMBINDEX, TROPONINI in the last 168 hours. No results for input(s): PROBNP  in the last 8760 hours. Coagulation Profile: No results for input(s): INR, PROTIME in the last 168 hours. Thyroid Function Tests: No results for input(s): TSH, T4TOTAL, FREET4, T3FREE, THYROIDAB in the last 72 hours. Lipid Profile: No results for input(s): CHOL, HDL, LDLCALC, TRIG, CHOLHDL, LDLDIRECT in the last 72 hours. Anemia Panel: No results for input(s): VITAMINB12, FOLATE, FERRITIN, TIBC, IRON, RETICCTPCT in the last 72 hours. Urine analysis:    Component Value Date/Time   COLORURINE YELLOW 11/03/2018 0341   APPEARANCEUR CLOUDY (A) 11/03/2018 0341   LABSPEC 1.024 11/03/2018 0341   PHURINE 6.0 11/03/2018 0341   GLUCOSEU >=500 (A) 11/03/2018 0341   HGBUR MODERATE (A) 11/03/2018 0341   HGBUR negative 11/01/2008 1121   BILIRUBINUR NEGATIVE 11/03/2018 0341   BILIRUBINUR neg. 12/21/2012 0933   KETONESUR 80 (A) 11/03/2018 0341   PROTEINUR 30 (A) 11/03/2018 0341   UROBILINOGEN 0.2 12/21/2012 0933   UROBILINOGEN 0.2 11/01/2008 1121   NITRITE NEGATIVE 11/03/2018 0341   LEUKOCYTESUR MODERATE (  A) 11/03/2018 0341   Sepsis Labs: Invalid input(s): PROCALCITONIN, [place]  Microbiology: No results found for this or any previous visit (from the past 240 hour(s)).  Radiology Studies: No results found.    Adrain Nesbit T. Flora  If 7PM-7AM, please contact night-coverage [email or web address] Password [email or web address] 01/14/2019, 5:00 PM

## 2019-01-14 NOTE — Progress Notes (Signed)
Occupational Therapy Treatment Patient Details Name: Becky Stout MRN: [id number] DOB: 22-May-1955 Today's Date: 01/14/2019    History of present illness 63 y.o. female with Past medical history of B12 deficiency, type II DM not on therapy, HTN, IDA, obesity, WPW syndrome.Pt dx with acute CVA, MRI reveals multiple cerebral and brainstem lacunar infarcts, acute metabolic encepholopathy, Hypertensive emergency, DKA, agorophobia, N/V, hypokalemia, and FTT.     OT comments  Pt very particular, but agreeable to therapy today. Pt making portion of bed eventhough she did not want to. Pt would not stop to wash hands at sink stating "I can't." Pt appears self limiting and no physical limitations as to why she cannot perform own ADL tasks and mobility more often. Pt performing own toilet hygiene with supervisionA/set-upA. Pt performing minguardA for transfers and mobility with RW. Pt would benefit from continued OT skilled services for ADL, safety and mobility. OT following acutely.    Follow Up Recommendations  SNF(progressing to home with [facility], but has to continent)    Equipment Recommendations       Recommendations for Other Services      Precautions / Restrictions Precautions Precautions: Fall Precaution Comments: anxious, very particular but receptive with redirection Restrictions Weight Bearing Restrictions: No       Mobility Bed Mobility Overal bed mobility: Modified Independent   Rolling: Modified independent (Device/Increase time)   Supine to sit: Modified independent (Device/Increase time) Sit to supine: Modified independent (Device/Increase time)      Transfers Overall transfer level: Needs assistance Equipment used: Rolling walker (2 wheeled) Transfers: Sit to/from Stand Sit to Stand: Min guard         General transfer comment: No physical assist required from commode; pt minguardA for stability for sit to stand    Balance Overall balance assessment: Needs  assistance Sitting-balance support: Feet supported Sitting balance-Leahy Scale: Good       Standing balance-Leahy Scale: Fair Standing balance comment: reliant on BUE support on RW                           ADL either performed or assessed with clinical judgement   ADL Overall ADL's : Needs assistance/impaired                                     Functional mobility during ADLs: Min guard General ADL Comments: Pt performing own toilet hygiene with supervisionA/set-upA. Pt exhibits severe self limiting behavior.     Vision       Perception     Praxis      Cognition Arousal/Alertness: Awake/alert Behavior During Therapy: WFL for tasks assessed/performed Overall Cognitive Status: Impaired/Different from baseline Area of Impairment: Following commands;Problem solving;Awareness;Safety/judgement                   Current Attention Level: Focused Memory: Decreased short-term memory Following Commands: Follows one step commands inconsistently Safety/Judgement: Decreased awareness of deficits Awareness: Emergent Problem Solving: Slow processing;Decreased initiation;Difficulty sequencing;Requires verbal cues General Comments: Pt very particular, but agreeable to therapy today. Pt making portion of bed eventhough she did not want to. Pt would not stop to wash hands at sink stating "I can't." Pt appears self limiting and no physical limitations as to why she cannot perform own ADL tasks more often.        Exercises     Shoulder Instructions  General Comments      Pertinent Vitals/ Pain       Pain Assessment: No/denies pain Faces Pain Scale: No hurt  Home Living                                          Prior Functioning/Environment              Frequency  Min 1X/week        Progress Toward Goals  OT Goals(current goals can now be found in the care plan section)  Progress towards OT goals: Progressing  toward goals  Acute Rehab OT Goals Patient Stated Goal: to go to bathroom OT Goal Formulation: With patient Time For Goal Achievement: 01/23/19 Potential to Achieve Goals: Fair ADL Goals Pt Will Perform Grooming: with supervision;standing Pt Will Perform Upper Body Dressing: with set-up;with supervision;sitting Pt Will Perform Lower Body Dressing: with min assist;sit to/from stand Pt Will Transfer to Toilet: with modified independence;bedside commode;ambulating Additional ADL Goal #1: Pt will follow multi-step commands for ADL/IADL tasks with min cues Additional ADL Goal #2: Pt will perform IADL tasks (making bed, cleaning counters and table) with [place] Discharge plan remains appropriate    Co-evaluation                 AM-PAC OT "6 Clicks" Daily Activity     Outcome Measure   Help from another person eating meals?: A Altic Help from another person taking care of personal grooming?: A Lot Help from another person toileting, which includes using toliet, bedpan, or urinal?: A Lot Help from another person bathing (including washing, rinsing, drying)?: A Lot Help from another person to put on and taking off regular upper body clothing?: A Lot Help from another person to put on and taking off regular lower body clothing?: A Lot 6 Click Score: 13    End of Session Equipment Utilized During Treatment: Gait belt;Rolling walker  OT Visit Diagnosis: Unsteadiness on feet (R26.81);Other abnormalities of gait and mobility (R26.89);Muscle weakness (generalized) (M62.81);History of falling (Z91.81);Other symptoms and signs involving cognitive function;Adult, failure to thrive (R62.7)   Activity Tolerance Treatment limited secondary to medical complications (Comment)   Patient Left in bed;with call bell/phone within reach;with bed alarm set   Nurse Communication Mobility status        Time: [id number] OT Time Calculation (min): 18 min  Charges: OT General  Charges $OT Visit: 1 Visit OT Treatments $Self Care/Home Management : 8-22 mins  Darryl Nestle) Marsa Aris OTR/L Acute Rehabilitation Services Pager: [phone] Office: [phone]    Audie Pinto 01/14/2019, 4:45 PM

## 2019-01-14 NOTE — Progress Notes (Signed)
Physical Therapy Treatment Patient Details Name: Becky Stout MRN: [id number] DOB: 25-Nov-1955 Today's Date: 01/14/2019    History of Present Illness 63 y.o. female with Past medical history of B12 deficiency, type II DM not on therapy, HTN, IDA, obesity, WPW syndrome.Pt dx with acute CVA, MRI reveals multiple cerebral and brainstem lacunar infarcts, acute metabolic encepholopathy, Hypertensive emergency, DKA, agorophobia, N/V, hypokalemia, and FTT.      PT Comments    Pt performed gt training and functional mobility with emphasis on progression of distance and decreased assistance.  She is performing all mobility at min guard assistance.  She continues to present with minor balance impairments and poor safety awareness but is much improved functionally this session.  She continues to require assistance and supervision and her 27yo mother cannot provide this.  Becky Stout will need to be functionally independent to d/c home.  Based on safety concern and need for assistance she continues to require snf placement.   Follow Up Recommendations  SNF     Equipment Recommendations  Wheelchair (measurements PT);Wheelchair cushion (measurements PT);3in1 (PT);Rolling walker with 5" wheels    Recommendations for Other Services       Precautions / Restrictions Precautions Precautions: Fall Precaution Comments: anxious, very particular but recpetive with redirection Restrictions Weight Bearing Restrictions: No    Mobility  Bed Mobility Overal bed mobility: Modified Independent   Rolling: Modified independent (Device/Increase time)   Supine to sit: Modified independent (Device/Increase time) Sit to supine: Modified independent (Device/Increase time)      Transfers Overall transfer level: Needs assistance Equipment used: Rolling walker (2 wheeled) Transfers: Sit to/from Stand Sit to Stand: Min guard         General transfer comment: Pt performed from bed and from commode.  Minor  instability coming to commode but able to self correct.  Ambulation/Gait Ambulation/Gait assistance: Min guard Gait Distance (Feet): 120 Feet Assistive device: Rolling walker (2 wheeled) Gait Pattern/deviations: Step-through pattern;Decreased stride length;Wide base of support;Drifts right/left Gait velocity: dec   General Gait Details: Cues for straight pathway and obstacle negotiation.   Stairs             Wheelchair Mobility    Modified Rankin (Stroke Patients Only) Modified Rankin (Stroke Patients Only) Pre-Morbid Rankin Score: Moderate disability Modified Rankin: Moderately severe disability     Balance Overall balance assessment: Needs assistance Sitting-balance support: Feet supported Sitting balance-Leahy Scale: Good       Standing balance-Leahy Scale: Fair                              Cognition Arousal/Alertness: Awake/alert Behavior During Therapy: WFL for tasks assessed/performed Overall Cognitive Status: Impaired/Different from baseline Area of Impairment: Following commands;Problem solving;Awareness;Safety/judgement                   Current Attention Level: Focused Memory: Decreased short-term memory Following Commands: Follows one step commands inconsistently(requires redirection to adhere to task.) Safety/Judgement: Decreased awareness of deficits Awareness: Emergent Problem Solving: Slow processing;Decreased initiation;Difficulty sequencing;Requires verbal cues General Comments: Pt remains very particular but easy to redirect with established rapport.  She is participating more in therapy and following commands better but still gets distracted and pesevrates on different things.      Exercises      General Comments        Pertinent Vitals/Pain Pain Assessment: No/denies pain Faces Pain Scale: No hurt    Home Living  Prior Function            PT Goals (current goals can now be  found in the care plan section) Acute Rehab PT Goals Patient Stated Goal: To walk back to bed. Potential to Achieve Goals: Fair Progress towards PT goals: Progressing toward goals    Frequency    Min 3X/week      PT Plan Discharge plan needs to be updated    Co-evaluation              AM-PAC PT "6 Clicks" Mobility   Outcome Measure  Help needed turning from your back to your side while in Becky flat bed without using bedrails?: None Help needed moving from lying on your back to sitting on the side of Becky flat bed without using bedrails?: None Help needed moving to and from Becky bed to Becky chair (including Becky wheelchair)?: Becky Stout Help needed standing up from Becky chair using your arms (e.g., wheelchair or bedside chair)?: Becky Stout Help needed to walk in hospital room?: Becky Stout Help needed climbing 3-5 steps with Becky railing? : Becky [place] 6 Click Score: 19    End of Session Equipment Utilized During Treatment: Gait belt Activity Tolerance: Patient tolerated treatment well Patient left: in bed;with call bell/phone within reach;with bed alarm set Nurse Communication: Mobility status PT Visit Diagnosis: Other abnormalities of gait and mobility (R26.89)     Time: [id number] PT Time Calculation (min) (ACUTE ONLY): 24 min  Charges:  $Gait Training: 8-22 mins $Therapeutic Activity: 8-22 mins                     Becky Stout , PTA Acute Rehabilitation Services Pager [phone] Office [phone]     Becky Stout Becky Stout 01/14/2019, 11:50 AM

## 2019-01-14 NOTE — Plan of Care (Signed)
  Problem: Education: Goal: Knowledge of General Education information will improve Description: Including pain rating scale, medication(s)/side effects and non-pharmacologic comfort measures Outcome: Progressing   Problem: Health Behavior/Discharge Planning: Goal: Ability to manage health-related needs will improve Outcome: Progressing   Problem: Clinical Measurements: Goal: Ability to maintain clinical measurements within normal limits will improve Outcome: Progressing Goal: Will remain free from infection Outcome: Progressing Goal: Diagnostic test results will improve Outcome: Progressing Goal: Respiratory complications will improve Outcome: Progressing Goal: Cardiovascular complication will be avoided Outcome: Progressing   Problem: Activity: Goal: Risk for activity intolerance will decrease Outcome: Progressing   Problem: Nutrition: Goal: Adequate nutrition will be maintained Outcome: Progressing   Problem: Coping: Goal: Level of anxiety will decrease Outcome: Progressing   Problem: Elimination: Goal: Will not experience complications related to bowel motility Outcome: Progressing Goal: Will not experience complications related to urinary retention Outcome: Progressing   Problem: Pain Managment: Goal: General experience of comfort will improve Outcome: Progressing   Problem: Safety: Goal: Ability to remain free from injury will improve Outcome: Progressing   Problem: Skin Integrity: Goal: Risk for impaired skin integrity will decrease Outcome: Progressing   Problem: Education: Goal: Knowledge of secondary prevention will improve Outcome: Progressing Goal: Knowledge of patient specific risk factors addressed and post discharge goals established will improve Outcome: Progressing Goal: Individualized Educational Video(s) Outcome: Progressing   Problem: Coping: Goal: Will verbalize positive feelings about self Outcome: Progressing Goal: Will identify  appropriate support needs Outcome: Progressing   Problem: Self-Care: Goal: Ability to participate in self-care as condition permits will improve Outcome: Progressing Goal: Verbalization of feelings and concerns over difficulty with self-care will improve Outcome: Progressing   Problem: Nutrition: Goal: Dietary intake will improve Outcome: Progressing   Problem: Ischemic Stroke/TIA Tissue Perfusion: Goal: Complications of ischemic stroke/TIA will be minimized Outcome: Progressing   Liana Crocker

## 2019-01-15 LAB — GLUCOSE, CAPILLARY
Glucose-Capillary: 167 mg/dL — ABNORMAL HIGH (ref 70–99)
Glucose-Capillary: 169 mg/dL — ABNORMAL HIGH (ref 70–99)
Glucose-Capillary: 84 mg/dL (ref 70–99)
Glucose-Capillary: 159 mg/dL — ABNORMAL HIGH (ref 70–99)

## 2019-01-15 NOTE — Progress Notes (Signed)
Physical Therapy Treatment Patient Details Name: Becky Stout MRN: [id number] DOB: July 12, 1955 Today's Date: 01/15/2019    History of Present Illness 63 y.o. female with Past medical history of B12 deficiency, type II DM not on therapy, HTN, IDA, obesity, WPW syndrome.Pt dx with acute CVA, MRI reveals multiple cerebral and brainstem lacunar infarcts, acute metabolic encepholopathy, Hypertensive emergency, DKA, agorophobia, N/V, hypokalemia, and FTT.      PT Comments    Pt performed gt training and trip to and from bathroom.  She remains fearful of falling but no LOB noted.  She has one minor instance with negotiation of door frame.  Based on need for min guard for safety she continues to require SNF placement.    Follow Up Recommendations  SNF     Equipment Recommendations  Wheelchair (measurements PT);Wheelchair cushion (measurements PT);3in1 (PT);Rolling walker with 5" wheels    Recommendations for Other Services       Precautions / Restrictions Precautions Precautions: Fall Precaution Comments: anxious, very particular but receptive with redirection Restrictions Weight Bearing Restrictions: No    Mobility  Bed Mobility Overal bed mobility: Modified Independent       Supine to sit: Modified independent (Device/Increase time) Sit to supine: Modified independent (Device/Increase time)   General bed mobility comments: No assistance needed.  Transfers Overall transfer level: Needs assistance Equipment used: Rolling walker (2 wheeled) Transfers: Sit to/from Stand Sit to Stand: Min guard         General transfer comment: No physical assist required from commode; pt minguardA for stability for sit to stand  Ambulation/Gait Ambulation/Gait assistance: Min guard Gait Distance (Feet): 130 Feet Assistive device: Rolling walker (2 wheeled) Gait Pattern/deviations: Step-through pattern;Decreased stride length;Wide base of support Gait velocity: dec   General Gait  Details: No drifting noted minor instability when turning where she ran RW into door frame.   Stairs             Wheelchair Mobility    Modified Rankin (Stroke Patients Only)       Balance Overall balance assessment: Needs assistance   Sitting balance-Leahy Scale: Good       Standing balance-Leahy Scale: Fair                              Cognition Arousal/Alertness: Awake/alert Behavior During Therapy: WFL for tasks assessed/performed Overall Cognitive Status: Impaired/Different from baseline                                 General Comments: Pt remains very particular but progressing well.  She performed all tasks without any outbursts.  She is now starting to perform pericare without prompting but she is not very thorough.      Exercises      General Comments        Pertinent Vitals/Pain Pain Assessment: No/denies pain Faces Pain Scale: No hurt Pain Location: generalized discomfort. Pain Descriptors / Indicators: Burning Pain Intervention(s): Monitored during session;Repositioned    Home Living                      Prior Function            PT Goals (current goals can now be found in the care plan section) Acute Rehab PT Goals Patient Stated Goal: to go to bathroom Potential to Achieve Goals: Good Progress towards PT goals: Progressing  toward goals    Frequency    Min 3X/week      PT Plan Current plan remains appropriate    Co-evaluation              AM-PAC PT "6 Clicks" Mobility   Outcome Measure  Help needed turning from your back to your side while in a flat bed without using bedrails?: None Help needed moving from lying on your back to sitting on the side of a flat bed without using bedrails?: None Help needed moving to and from a bed to a chair (including a wheelchair)?: A [place] Help needed standing up from a chair using your arms (e.g., wheelchair or bedside chair)?: A [place] Help needed  to walk in hospital room?: A [place] Help needed climbing 3-5 steps with a railing? : A [place] 6 Click Score: 20    End of Session Equipment Utilized During Treatment: Gait belt Activity Tolerance: Patient tolerated treatment well Patient left: in bed;with call bell/phone within reach;with bed alarm set Nurse Communication: Mobility status PT Visit Diagnosis: Other abnormalities of gait and mobility (R26.89)     Time: 1440-1459 PT Time Calculation (min) (ACUTE ONLY): 19 min  Charges:  $Gait Training: 8-22 mins                     Erasmo Leventhal , PTA Acute Rehabilitation Services Pager [phone] Office [phone]     Deronda Christian [name] 01/15/2019, 3:11 PM

## 2019-01-15 NOTE — Progress Notes (Signed)
PROGRESS NOTE  Becky Stout [id number] DOB: March 13, 1955   PCP: Abner Greenspan, MD  Patient is from: home  DOA: 11/03/2018 LOS: 42  Brief Narrative / Interim history: 63 year old female with PMH of DM 2, HTN, WPW syndrome, frequent falls, confusion, and weight loss was admitted for DKA, acute CVA and metabolic encephalopathy. Patient has been medically stable for discharge for several weeks but ongoing prolonged hospital stay due to difficulty in SNF placement.  Subjective: No major events overnight of this morning.  No complaints.  Asking for when she could go home.   Objective: Vitals:   01/15/19 0400 01/15/19 0500 01/15/19 0756 01/15/19 1144  BP: (!) 120/52  (!) 138/57 138/63  Pulse: 74  76 77  Resp: 18  18 19   Temp: 98.6 F (37 C)  98.1 F (36.7 C) 98.8 F (37.1 C)  TempSrc: Oral  Oral Oral  SpO2: 99%  100% 98%  Weight:  97 kg    Height:        Intake/Output Summary (Last 24 hours) at 01/15/2019 1436 Last data filed at 01/15/2019 1400 Gross per 24 hour  Intake 490 ml  Output --  Net 490 ml   Filed Weights   01/13/19 0400 01/14/19 0401 01/15/19 0500  Weight: 99 kg 98 kg 97 kg    Examination:  GENERAL: No acute distress.  Appears well.  HEENT: MMM.  Vision and hearing grossly intact.  NECK: Supple.  No apparent JVD.  RESP:  No IWOB. Good air movement bilaterally. CVS:  RRR. Heart sounds normal.  ABD/GI/GU: Bowel sounds present. Soft. Non tender.  MSK/EXT:  Moves extremities. No apparent deformity or edema.  SKIN: no apparent skin lesion or wound NEURO: Awake, alert and oriented appropriately.  Motor 4/5 throughout. PSYCH: Calm.  Limited insight.  Procedures:  None  Assessment & Plan: DKA (diabetic ketoacidoses)-DKA resolved.  A1c 7.9%..  Blood sugars currently stable. Recent Labs    01/14/19 2104 01/15/19 0631 01/15/19 1146  GLUCAP 143* 167* 169*  -Continue Lantus 14 units daily, NovoLog 4 units AC, Tradjenta and SSI. -Continue  statin.  Acute CVA with left hemiparesis - presenting problem.  Left hemiparesis improved. MRI on 10/10 revealed scattered small acute infarcts in both cerebral hemispheres and brainstem;  also noted chronic basal ganglia infarcts and moderately advanced cerebral atrophy. TTE 10/11 with  normal EF, impaired LV diastolic filling suggestive of diastolic heart failure, no thrombus was noted. Carotid ultrasound and transcranial Dopplers were unremarkable. -Neurology signed off 11/05/2018 -Continue Plavix.  Completed 3-week course of DAPT with Plavix. -Continue atorvastatin 80 mg p.o. daily -Awaiting SNF.   WPW syndrome?  Reviewed her EKGs but did not see this. -No active issues  Essential hypertension: Normotensive -On amlodipine, Coreg, lisinopril and as needed hydralazine.  Anxiety/depression and agoraphobia Cognitive decline: POA -evaluated by psychiatry and started on Zoloft for psychiatric concerns -Zoloft increased to 100 mg daily on 12/11  Hypokalemia and hypomagnesemia: Resolved. -Monitor intermittently.  GERD: Continue Protonix 40 mg p.o. daily  Obesity: Body mass index is 37.65 kg/m.     Nutrition Problem: Inadequate oral intake Etiology: poor appetite  Signs/Symptoms: per patient/family report  Interventions: Prostat, Boost Breeze   DVT prophylaxis: Subcu Lovenox Code Status: DNR/DNI Family Communication: Patient and/or Therapist, sports. Disposition Plan: SNF Consultants: Neurology (off)   Microbiology summarized: 10/10-COVID-19 negative. 10/10-urine culture with multiple species. 10/11-urine culture negative. 10/10 blood cultures negative.  Sch Meds:  Scheduled Meds: . amLODipine  10 mg Oral Daily  . atorvastatin  80 mg Oral q1800  . carvedilol  6.25 mg Oral BID WC  . clopidogrel  75 mg Oral Daily  . enoxaparin (LOVENOX) injection  40 mg Subcutaneous Q24H  . feeding supplement  1 Container Oral BID BM  . feeding supplement (PRO-STAT SUGAR FREE 64)  30 mL Oral  TID  . insulin aspart  0-15 Units Subcutaneous TID WC  . insulin aspart  0-5 Units Subcutaneous QHS  . insulin aspart  4 Units Subcutaneous TID WC  . insulin glargine  14 Units Subcutaneous QHS  . linagliptin  5 mg Oral Daily  . lisinopril  40 mg Oral Daily  . pantoprazole  40 mg Oral Daily  . sertraline  100 mg Oral Daily  . sodium chloride flush  10-40 mL Intracatheter Q12H  . thiamine  100 mg Oral Daily   Continuous Infusions: PRN Meds:.acetaminophen, hydrALAZINE, ondansetron (ZOFRAN) IV, sodium chloride flush  Antimicrobials: Anti-infectives (From admission, onward)   Start     Dose/Rate Route Frequency Ordered Stop   11/03/18 1930  cefTRIAXone (ROCEPHIN) 1 g in sodium chloride 0.9 % 100 mL IVPB  Status:  Discontinued     1 g 200 mL/hr over 30 Minutes Intravenous Every 24 hours 11/03/18 1724 11/06/18 1118   11/03/18 0530  cefTRIAXone (ROCEPHIN) 1 g in sodium chloride 0.9 % 100 mL IVPB     1 g 200 mL/hr over 30 Minutes Intravenous  Once 11/03/18 0525 11/03/18 0629       I have personally reviewed the following labs and images: CBC: Recent Labs  Lab 01/12/19 0535  WBC 9.7  HGB 12.3  HCT 37.3  MCV 92.6  PLT 287   BMP &GFR Recent Labs  Lab 01/12/19 0535  NA 139  K 3.4*  CL 105  CO2 23  GLUCOSE 167*  BUN 18  CREATININE 0.53  CALCIUM 9.0  MG 1.7   Estimated Creatinine Clearance: 83.5 mL/min (by C-G formula based on SCr of 0.53 mg/dL). Liver & Pancreas: No results for input(s): AST, ALT, ALKPHOS, BILITOT, PROT, ALBUMIN in the last 168 hours. No results for input(s): LIPASE, AMYLASE in the last 168 hours. No results for input(s): AMMONIA in the last 168 hours. Diabetic: No results for input(s): HGBA1C in the last 72 hours. Recent Labs  Lab 01/14/19 1216 01/14/19 1621 01/14/19 2104 01/15/19 0631 01/15/19 1146  GLUCAP 222* 100* 143* 167* 169*   Cardiac Enzymes: No results for input(s): CKTOTAL, CKMB, CKMBINDEX, TROPONINI in the last 168 hours. No  results for input(s): PROBNP in the last 8760 hours. Coagulation Profile: No results for input(s): INR, PROTIME in the last 168 hours. Thyroid Function Tests: No results for input(s): TSH, T4TOTAL, FREET4, T3FREE, THYROIDAB in the last 72 hours. Lipid Profile: No results for input(s): CHOL, HDL, LDLCALC, TRIG, CHOLHDL, LDLDIRECT in the last 72 hours. Anemia Panel: No results for input(s): VITAMINB12, FOLATE, FERRITIN, TIBC, IRON, RETICCTPCT in the last 72 hours. Urine analysis:    Component Value Date/Time   COLORURINE YELLOW 11/03/2018 0341   APPEARANCEUR CLOUDY (A) 11/03/2018 0341   LABSPEC 1.024 11/03/2018 0341   PHURINE 6.0 11/03/2018 0341   GLUCOSEU >=500 (A) 11/03/2018 0341   HGBUR MODERATE (A) 11/03/2018 0341   HGBUR negative 11/01/2008 1121   BILIRUBINUR NEGATIVE 11/03/2018 0341   BILIRUBINUR neg. 12/21/2012 0933   KETONESUR 80 (A) 11/03/2018 0341   PROTEINUR 30 (A) 11/03/2018 0341   UROBILINOGEN 0.2 12/21/2012 0933   UROBILINOGEN 0.2 11/01/2008 1121   NITRITE NEGATIVE 11/03/2018 0341  LEUKOCYTESUR MODERATE (A) 11/03/2018 0341   Sepsis Labs: Invalid input(s): PROCALCITONIN, [place]  Microbiology: No results found for this or any previous visit (from the past 240 hour(s)).  Radiology Studies: No results found.    Tomicka Lover T. Slater  If 7PM-7AM, please contact night-coverage [email or web address] Password [facility] 01/15/2019, 2:36 PM

## 2019-01-15 NOTE — Plan of Care (Signed)
  Problem: Activity: Goal: Risk for activity intolerance will decrease Outcome: Progressing   

## 2019-01-16 LAB — GLUCOSE, CAPILLARY
Glucose-Capillary: 155 mg/dL — ABNORMAL HIGH (ref 70–99)
Glucose-Capillary: 138 mg/dL — ABNORMAL HIGH (ref 70–99)
Glucose-Capillary: 108 mg/dL — ABNORMAL HIGH (ref 70–99)
Glucose-Capillary: 153 mg/dL — ABNORMAL HIGH (ref 70–99)
Glucose-Capillary: 163 mg/dL — ABNORMAL HIGH (ref 70–99)

## 2019-01-16 NOTE — Progress Notes (Signed)
PROGRESS NOTE  Becky Stout [id number] DOB: [birth year]   PCP: Abner Greenspan, MD  Patient is from: home  DOA: 11/03/2018 LOS: 64  Brief Narrative / Interim history: 63 year old female with PMH of DM 2, HTN, WPW syndrome, frequent falls, confusion/cognitive decline, and weight loss was admitted for DKA, acute CVA and metabolic encephalopathy. Patient has been medically stable for discharge for several weeks but ongoing prolonged hospital stay due to difficulty in SNF placement.  Subjective: No major events overnight of this morning.  No complaints.  She asked if she could go home today.  She says she talked to her mother and a sister which I do not think is the case.  She has cognitive decline although oriented fairly.  Objective: Vitals:   01/16/19 0338 01/16/19 0500 01/16/19 0743 01/16/19 1148  BP: (!) 114/47  131/61 (!) 128/56  Pulse: 70  71 64  Resp: 17  16 18   Temp: 98.2 F (36.8 C)  98.4 F (36.9 C) 98.2 F (36.8 C)  TempSrc: Oral  Oral Oral  SpO2: 97%  98% 99%  Weight:  97 kg    Height:        Intake/Output Summary (Last 24 hours) at 01/16/2019 1335 Last data filed at 01/16/2019 0740 Gross per 24 hour  Intake 600 ml  Output --  Net 600 ml   Filed Weights   01/14/19 0401 01/15/19 0500 01/16/19 0500  Weight: 98 kg 97 kg 97 kg    Examination:  GENERAL: No acute distress.  Appears well.  HEENT: MMM.  Vision and hearing grossly intact.  NECK: Supple.  No apparent JVD.  RESP:  No IWOB. Good air movement bilaterally. CVS:  RRR. Heart sounds normal.  ABD/GI/GU: Bowel sounds present. Soft. Non tender.  MSK/EXT:  Moves extremities. No apparent deformity or edema.  SKIN: no apparent skin lesion or wound NEURO: Awake, alert and oriented fairly.  Generalized weakness in all extremities. PSYCH: Calm.  Limited insight.  Some cognitive impairment.  Procedures:  None  Assessment & Plan: DKA (diabetic ketoacidoses)-DKA resolved.  A1c 7.9%..  Blood sugars  currently stable. Recent Labs    01/15/19 2120 01/16/19 0602 01/16/19 1147  GLUCAP 159* 155* 138*  -Continue Lantus 14 units daily, NovoLog 4 units AC, Tradjenta and SSI. -Continue statin.  Acute CVA with left hemiparesis - presenting problem.  Left hemiparesis improved. MRI on 10/10 revealed scattered small acute infarcts in both cerebral hemispheres and brainstem;  also noted chronic basal ganglia infarcts and moderately advanced cerebral atrophy. TTE 10/11 with  normal EF, impaired LV diastolic filling suggestive of diastolic heart failure, no thrombus was noted. Carotid ultrasound and transcranial Dopplers were unremarkable. -Neurology signed off 11/05/2018 -Completed 3-week course of DAPT. Continue Plavix.   -Continue atorvastatin 80 mg p.o. daily -Awaiting SNF.   WPW syndrome?  Reviewed her EKGs but did not see this. -No active issues  Essential hypertension: Normotensive -On amlodipine, Coreg, lisinopril and as needed hydralazine.  Anxiety/depression/Cognitive decline/impairments: POA -evaluated by psychiatry and started on Zoloft for psychiatric concerns -Zoloft increased to 100 mg daily on 12/11  Hypokalemia and hypomagnesemia: Resolved. -Monitor intermittently.  GERD: Continue Protonix 40 mg p.o. daily  Class II obesity: Body mass index is 37.65 kg/m.     Nutrition Problem: Inadequate oral intake Etiology: poor appetite  Signs/Symptoms: per patient/family report  Interventions: Prostat, Boost Breeze   DVT prophylaxis: Subcu Lovenox Code Status: DNR/DNI Family Communication: Patient and/or Therapist, sports. Disposition Plan: SNF Consultants: Neurology (off)   Microbiology  summarized: 10/10-COVID-19 negative. 10/10-urine culture with multiple species. 10/11-urine culture negative. 10/10 blood cultures negative.  Sch Meds:  Scheduled Meds: . amLODipine  10 mg Oral Daily  . atorvastatin  80 mg Oral q1800  . carvedilol  6.25 mg Oral BID WC  . clopidogrel  75  mg Oral Daily  . enoxaparin (LOVENOX) injection  40 mg Subcutaneous Q24H  . feeding supplement  1 Container Oral BID BM  . feeding supplement (PRO-STAT SUGAR FREE 64)  30 mL Oral TID  . insulin aspart  0-15 Units Subcutaneous TID WC  . insulin aspart  0-5 Units Subcutaneous QHS  . insulin aspart  4 Units Subcutaneous TID WC  . insulin glargine  14 Units Subcutaneous QHS  . linagliptin  5 mg Oral Daily  . lisinopril  40 mg Oral Daily  . pantoprazole  40 mg Oral Daily  . sertraline  100 mg Oral Daily  . sodium chloride flush  10-40 mL Intracatheter Q12H  . thiamine  100 mg Oral Daily   Continuous Infusions: PRN Meds:.acetaminophen, hydrALAZINE, ondansetron (ZOFRAN) IV, sodium chloride flush  Antimicrobials: Anti-infectives (From admission, onward)   Start     Dose/Rate Route Frequency Ordered Stop   11/03/18 1930  cefTRIAXone (ROCEPHIN) 1 g in sodium chloride 0.9 % 100 mL IVPB  Status:  Discontinued     1 g 200 mL/hr over 30 Minutes Intravenous Every 24 hours 11/03/18 1724 11/06/18 1118   11/03/18 0530  cefTRIAXone (ROCEPHIN) 1 g in sodium chloride 0.9 % 100 mL IVPB     1 g 200 mL/hr over 30 Minutes Intravenous  Once 11/03/18 0525 11/03/18 0629       I have personally reviewed the following labs and images: CBC: Recent Labs  Lab 01/12/19 0535  WBC 9.7  HGB 12.3  HCT 37.3  MCV 92.6  PLT 287   BMP &GFR Recent Labs  Lab 01/12/19 0535  NA 139  K 3.4*  CL 105  CO2 23  GLUCOSE 167*  BUN 18  CREATININE 0.53  CALCIUM 9.0  MG 1.7   Estimated Creatinine Clearance: 83.5 mL/min (by C-G formula based on SCr of 0.53 mg/dL). Liver & Pancreas: No results for input(s): AST, ALT, ALKPHOS, BILITOT, PROT, ALBUMIN in the last 168 hours. No results for input(s): LIPASE, AMYLASE in the last 168 hours. No results for input(s): AMMONIA in the last 168 hours. Diabetic: No results for input(s): HGBA1C in the last 72 hours. Recent Labs  Lab 01/15/19 1146 01/15/19 1537  01/15/19 2120 01/16/19 0602 01/16/19 1147  GLUCAP 169* 84 159* 155* 138*   Cardiac Enzymes: No results for input(s): CKTOTAL, CKMB, CKMBINDEX, TROPONINI in the last 168 hours. No results for input(s): PROBNP in the last 8760 hours. Coagulation Profile: No results for input(s): INR, PROTIME in the last 168 hours. Thyroid Function Tests: No results for input(s): TSH, T4TOTAL, FREET4, T3FREE, THYROIDAB in the last 72 hours. Lipid Profile: No results for input(s): CHOL, HDL, LDLCALC, TRIG, CHOLHDL, LDLDIRECT in the last 72 hours. Anemia Panel: No results for input(s): VITAMINB12, FOLATE, FERRITIN, TIBC, IRON, RETICCTPCT in the last 72 hours. Urine analysis:    Component Value Date/Time   COLORURINE YELLOW 11/03/2018 0341   APPEARANCEUR CLOUDY (A) 11/03/2018 0341   LABSPEC 1.024 11/03/2018 0341   PHURINE 6.0 11/03/2018 0341   GLUCOSEU >=500 (A) 11/03/2018 0341   HGBUR MODERATE (A) 11/03/2018 0341   HGBUR negative 11/01/2008 [place] 11/03/2018 0341   BILIRUBINUR neg. 12/21/2012 0933  KETONESUR 80 (A) 11/03/2018 0341   PROTEINUR 30 (A) 11/03/2018 0341   UROBILINOGEN 0.2 12/21/2012 0933   UROBILINOGEN 0.2 11/01/2008 1121   NITRITE NEGATIVE 11/03/2018 0341   LEUKOCYTESUR MODERATE (A) 11/03/2018 0341   Sepsis Labs: Invalid input(s): PROCALCITONIN, [place]  Microbiology: No results found for this or any previous visit (from the past 240 hour(s)).  Radiology Studies: No results found.    Brendy Ficek T. [place]  If 7PM-7AM, please contact night-coverage [email or web address] Password [email or web address] 01/16/2019, 1:35 PM

## 2019-01-16 NOTE — Progress Notes (Signed)
Physical Therapy Treatment Patient Details Name: CYERA GARDE MRN: [id number] DOB: 1955/10/17 Today's Date: 01/16/2019    History of Present Illness 63 y.o. female with Past medical history of B12 deficiency, type II DM not on therapy, HTN, IDA, obesity, WPW syndrome.Pt dx with acute CVA, MRI reveals multiple cerebral and brainstem lacunar infarcts, acute metabolic encepholopathy, Hypertensive emergency, DKA, agorophobia, N/V, hypokalemia, and FTT.      PT Comments    Pt supine in bed.  She was agreeable to perform toilet transfer, shower and gt training.,  Pt performed bed mobs with mod I, transfers with min guard, and gt with supervision.  Functionally she is progressing well. She continues to require max to total assistance to perform ADLs and would be best suited in a SNF.  She is starting to toilet when staff asks but still has moments of incontinence.    Follow Up Recommendations  SNF     Equipment Recommendations  Wheelchair (measurements PT);Wheelchair cushion (measurements PT);3in1 (PT);Rolling walker with 5" wheels    Recommendations for Other Services       Precautions / Restrictions Precautions Precautions: Fall Precaution Comments: anxious, very particular but receptive with redirection Restrictions Weight Bearing Restrictions: No    Mobility  Bed Mobility Overal bed mobility: Modified Independent       Supine to sit: Modified independent (Device/Increase time) Sit to supine: Modified independent (Device/Increase time)      Transfers Overall transfer level: Needs assistance Equipment used: Rolling walker (2 wheeled) Transfers: Sit to/from Stand Sit to Stand: Min guard         General transfer comment: touching patient arm for close min guard.  Ambulation/Gait Ambulation/Gait assistance: Supervision Gait Distance (Feet): 60 Feet Assistive device: Rolling walker (2 wheeled) Gait Pattern/deviations: Step-through pattern;Decreased stride  length Gait velocity: dec   General Gait Details: Close supervision for safety.  Pt is slow and guarded and very anxious   Stairs             Wheelchair Mobility    Modified Rankin (Stroke Patients Only) Modified Rankin (Stroke Patients Only) Pre-Morbid Rankin Score: Moderate disability Modified Rankin: Moderately severe disability     Balance Overall balance assessment: Needs assistance Sitting-balance support: Feet supported Sitting balance-Leahy Scale: Good Sitting balance - Comments: minguard for sitting balance     Standing balance-Leahy Scale: Fair Standing balance comment: reliant on BUE support on RW                            Cognition Arousal/Alertness: Awake/alert Behavior During Therapy: WFL for tasks assessed/performed Overall Cognitive Status: Impaired/Different from baseline Area of Impairment: Following commands;Problem solving;Awareness;Safety/judgement                 Orientation Level: Situation;Disoriented to Current Attention Level: Focused Memory: Decreased short-term memory Following Commands: Follows one step commands inconsistently Safety/Judgement: Decreased awareness of deficits Awareness: Emergent Problem Solving: Slow processing;Decreased initiation;Difficulty sequencing;Requires verbal cues General Comments: Pt continues to perform functional tasks with functional gains.  Pt is progressing well from a PT stand point.  She continues to present with cognitive and poor hygiene.  Pt has a large matt in the back of her head and spoke to her sister about coming to see if Joei would let her ( her sister) cut it.      Exercises      General Comments        Pertinent Vitals/Pain Pain Assessment: No/denies pain Faces Pain  Scale: No hurt Pain Location: generalized discomfort. Pain Descriptors / Indicators: Burning Pain Intervention(s): Monitored during session;Repositioned    Home Living                       Prior Function            PT Goals (current goals can now be found in the care plan section) Acute Rehab PT Goals Patient Stated Goal: to go to bathroom Potential to Achieve Goals: Good Progress towards PT goals: Progressing toward goals    Frequency    Min 3X/week      PT Plan Current plan remains appropriate    Co-evaluation              AM-PAC PT "6 Clicks" Mobility   Outcome Measure  Help needed turning from your back to your side while in a flat bed without using bedrails?: None Help needed moving from lying on your back to sitting on the side of a flat bed without using bedrails?: None Help needed moving to and from a bed to a chair (including a wheelchair)?: A Edwards Help needed standing up from a chair using your arms (e.g., wheelchair or bedside chair)?: A [place] Help needed to walk in hospital room?: A [place] Help needed climbing 3-5 steps with a railing? : A Rider 6 Click Score: 20    End of Session Equipment Utilized During Treatment: Gait belt Activity Tolerance: Patient tolerated treatment well Patient left: in bed;with call bell/phone within reach;with bed alarm set Nurse Communication: Mobility status PT Visit Diagnosis: Other abnormalities of gait and mobility (R26.89)     Time: [id number] PT Time Calculation (min) (ACUTE ONLY): 41 min  Charges:  $Gait Training: 8-22 mins $Therapeutic Activity: 23-37 mins                     Erasmo Leventhal , PTA Acute Rehabilitation Services Pager [phone] Office [phone]     Trinten Boudoin [name] 01/16/2019, 3:04 PM

## 2019-01-17 DIAGNOSIS — N3000 Acute cystitis without hematuria: Secondary | ICD-10-CM

## 2019-01-17 LAB — GLUCOSE, CAPILLARY
Glucose-Capillary: 181 mg/dL — ABNORMAL HIGH (ref 70–99)
Glucose-Capillary: 139 mg/dL — ABNORMAL HIGH (ref 70–99)
Glucose-Capillary: 119 mg/dL — ABNORMAL HIGH (ref 70–99)
Glucose-Capillary: 153 mg/dL — ABNORMAL HIGH (ref 70–99)

## 2019-01-17 NOTE — Progress Notes (Signed)
Pt slept well throughout night without issue, ambulated to BR with minimal assist and roll walker. Continent overnight

## 2019-01-17 NOTE — Progress Notes (Signed)
PROGRESS NOTE    Becky Stout  [id number] DOB: 1955-08-26 DOA: 11/03/2018 PCP: Becky Greenspan, MD  Brief Narrative: 63 year old female with PMH of DM 2, HTN, WPW syndrome, frequent falls, confusion, and weight loss was admitted for DKA, acute CVA and metabolic encephalopathy. Patient has been medically stable for discharge for several weeks but ongoing prolonged hospital stay due to difficulty in SNF placement.  Assessment & Plan:   Principal Problem:   DKA (diabetic ketoacidoses) ([place]) Active Problems:   Hypothyroidism   B12 deficiency   WOLFF (WOLFE)-PARKINSON-WHITE (WPW) SYNDROME   Essential hypertension   Hyperlipidemia   Poorly controlled type 2 diabetes mellitus (HCC)   Anxiety   Failure to thrive in adult   Acute lower UTI   High anion gap metabolic acidosis   Cerebral thrombosis with cerebral infarction  DKA (diabetic ketoacidoses)-DKA resolved.  A1c 7.9%.. Blood sugars currently stable.  Blood sugars last 24 hours 108, 163, 153, 181, 139. CBG (last 3)  Recent Labs    01/16/19 2119 01/17/19 0616 01/17/19 1206  GLUCAP 153* 181* 139*     Recent Labs (last 2 labs)        Recent Labs    01/14/19 2104 01/15/19 0631 01/15/19 1146  GLUCAP 143* 167* 169*    -Continue Lantus 14 units daily, NovoLog 4 units AC, Tradjenta and SSI. -Continue statin.  Acute CVA with left hemiparesis -presenting problem.  Left hemiparesis improved. MRI on 10/10 revealed scattered small acute infarcts in both cerebral hemispheres and brainstem; also noted chronic basal ganglia infarcts and moderately advanced cerebral atrophy. TTE 10/11 with normal EF, impaired LV diastolic filling suggestive of diastolic heart failure, no thrombus was noted. Carotid ultrasound and transcranial Dopplers were unremarkable. -Neurology signed off 11/05/2018 -Continue Plavix.  Completed 3-week course of DAPT with Plavix. -Continue atorvastatin 80 mg p.o. daily -Awaiting SNF.   WPW  syndrome?  Reviewed her EKGs but did not see this. -No active issues  Essential hypertension: Normotensive 128/65 -On amlodipine, Coreg, lisinopril and as needed hydralazine.  Anxiety/depression and agoraphobia Cognitive decline: POA -evaluated by psychiatry and started on Zoloft for psychiatric concerns -Zoloft increased to 100 mg daily on 12/11  Hypokalemia and hypomagnesemia: check labs  GERD:Continue Protonix 40 mg p.o. daily  Obesity: Body mass index is 37.65 kg/m.   Nutrition Problem: Inadequate oral intake Etiology: poor appetite     Signs/Symptoms: per patient/family report    Interventions: Prostat, Boost Breeze  Estimated body mass index is 36.71 kg/m as calculated from the following:   Height as of this encounter: 5\' 4"  (1.626 m).   Weight as of this encounter: 97 kg.  DVT prophylaxis: Subcu Lovenox Code Status: DNR/DNI Family Communication: Patient and/or RN. Disposition Plan: SNF Consultants: Neurology (off)   Subjective:  She is resting in bed.  Staff has no new concerns no events overnight Patient denies any new complaints she is anxious to go home She lives at home with her 42 year old mother Objective: Vitals:   01/17/19 0009 01/17/19 0500 01/17/19 0736 01/17/19 1207  BP: 126/63 134/68 (!) 136/57 128/65  Pulse: 71 70 74 69  Resp: 18 18 17 18   Temp: 98.1 F (36.7 C) (!) 97.5 F (36.4 C) 98.9 F (37.2 C) 98.4 F (36.9 C)  TempSrc: Oral Oral Oral Oral  SpO2: 98% 99% 97% 99%  Weight:      Height:        Intake/Output Summary (Last 24 hours) at 01/17/2019 1220 Last data filed at 01/17/2019 [id number] Gross  per 24 hour  Intake 130 ml  Output --  Net 130 ml   Filed Weights   01/14/19 0401 01/15/19 0500 01/16/19 0500  Weight: 98 kg 97 kg 97 kg    Examination:  General exam: Appears calm and comfortable  Respiratory system: Clear to auscultation. Respiratory effort normal. Cardiovascular system: S1 & S2 heard, RRR. No JVD,  murmurs, rubs, gallops or clicks. No pedal edema. Gastrointestinal system: Abdomen is nondistended, soft and nontender. No organomegaly or masses felt. Normal bowel sounds heard. Central nervous system: Alert and oriented. No focal neurological deficits. Extremities: Moves all extremities Skin: No rashes, lesions or ulcers Psychiatry: Impaired judgment and insight  Data Reviewed: I have personally reviewed following labs and imaging studies  CBC: Recent Labs  Lab 01/12/19 0535  WBC 9.7  HGB 12.3  HCT 37.3  MCV 92.6  PLT [id number]   Basic Metabolic Panel: Recent Labs  Lab 01/12/19 0535  NA 139  K 3.4*  CL 105  CO2 23  GLUCOSE 167*  BUN 18  CREATININE 0.53  CALCIUM 9.0  MG 1.7   GFR: Estimated Creatinine Clearance: 83.5 mL/min (by C-G formula based on SCr of 0.53 mg/dL). Liver Function Tests: No results for input(s): AST, ALT, ALKPHOS, BILITOT, PROT, ALBUMIN in the last 168 hours. No results for input(s): LIPASE, AMYLASE in the last 168 hours. No results for input(s): AMMONIA in the last 168 hours. Coagulation Profile: No results for input(s): INR, PROTIME in the last 168 hours. Cardiac Enzymes: No results for input(s): CKTOTAL, CKMB, CKMBINDEX, TROPONINI in the last 168 hours. BNP (last 3 results) No results for input(s): PROBNP in the last 8760 hours. HbA1C: No results for input(s): HGBA1C in the last 72 hours. CBG: Recent Labs  Lab 01/16/19 1653 01/16/19 2038 01/16/19 2119 01/17/19 0616 01/17/19 1206  GLUCAP 108* 163* 153* 181* 139*   Lipid Profile: No results for input(s): CHOL, HDL, LDLCALC, TRIG, CHOLHDL, LDLDIRECT in the last 72 hours. Thyroid Function Tests: No results for input(s): TSH, T4TOTAL, FREET4, T3FREE, THYROIDAB in the last 72 hours. Anemia Panel: No results for input(s): VITAMINB12, FOLATE, FERRITIN, TIBC, IRON, RETICCTPCT in the last 72 hours. Sepsis Labs: No results for input(s): PROCALCITON, LATICACIDVEN in the last 168 hours.  No  results found for this or any previous visit (from the past 240 hour(s)).       Radiology Studies: No results found.      Scheduled Meds: . amLODipine  10 mg Oral Daily  . atorvastatin  80 mg Oral q1800  . carvedilol  6.25 mg Oral BID WC  . clopidogrel  75 mg Oral Daily  . enoxaparin (LOVENOX) injection  40 mg Subcutaneous Q24H  . feeding supplement  1 Container Oral BID BM  . feeding supplement (PRO-STAT SUGAR FREE 64)  30 mL Oral TID  . insulin aspart  0-15 Units Subcutaneous TID WC  . insulin aspart  0-5 Units Subcutaneous QHS  . insulin aspart  4 Units Subcutaneous TID WC  . insulin glargine  14 Units Subcutaneous QHS  . linagliptin  5 mg Oral Daily  . lisinopril  40 mg Oral Daily  . pantoprazole  40 mg Oral Daily  . sertraline  100 mg Oral Daily  . sodium chloride flush  10-40 mL Intracatheter Q12H  . thiamine  100 mg Oral Daily   Continuous Infusions:   LOS: 75 days     Georgette Shell, MD [facility]  If 7PM-7AM, please contact night-coverage [email or web address] Password [email or web address] 01/17/2019,  12:20 PM

## 2019-01-17 NOTE — Plan of Care (Signed)
No signs or syptoms of infections at this time.

## 2019-01-18 LAB — GLUCOSE, CAPILLARY
Glucose-Capillary: 180 mg/dL — ABNORMAL HIGH (ref 70–99)
Glucose-Capillary: 183 mg/dL — ABNORMAL HIGH (ref 70–99)
Glucose-Capillary: 93 mg/dL (ref 70–99)
Glucose-Capillary: 159 mg/dL — ABNORMAL HIGH (ref 70–99)

## 2019-01-18 MED ORDER — INSULIN GLARGINE 100 UNIT/ML ~~LOC~~ SOLN
18.0000 [IU] | Freq: Every day | SUBCUTANEOUS | Status: DC
  Administered 2019-01-18 – 2019-01-30 (×13): 18 [IU] via SUBCUTANEOUS
  Filled 2019-01-18 (×14): qty 0.18

## 2019-01-18 NOTE — Progress Notes (Addendum)
PROGRESS NOTE    Becky Stout  [id number] DOB: [birth year] DOA: 11/03/2018 PCP: Abner Greenspan, MD   Brief Narrative: 63 year old female with PMH of DM 2, HTN, WPW syndrome, frequent falls, confusion, and weight loss was admitted for DKA, acute CVA and metabolic encephalopathy. Patient has been medically stable for discharge for several weeks but ongoing prolonged hospital stay due to difficulty in SNF placement.  Assessment & Plan:   Principal Problem:   DKA (diabetic ketoacidoses) ([place]) Active Problems:   Hypothyroidism   B12 deficiency   WOLFF (WOLFE)-PARKINSON-WHITE (WPW) SYNDROME   Essential hypertension   Hyperlipidemia   Poorly controlled type 2 diabetes mellitus (HCC)   Anxiety   Failure to thrive in adult   Acute lower UTI   High anion gap metabolic acidosis   Cerebral thrombosis with cerebral infarction   Acute cystitis without hematuria    #1 DKA resolved.  Blood sugar stable.  A1c 7.9%.  Increase Lantus 18 units daily, NovoLog 4 units 3 times a day, SSI, Tradjenta.  Continue statin.  CBG last 24 hours  CBG (last 3)  Recent Labs    01/17/19 1648 01/17/19 2117 01/18/19 0613  GLUCAP 119* 153* 180*   #2 hypertension-blood pressure 130/57 on Norvasc 10 mg daily, Coreg 6.25 mg twice a day.  Continue lisinopril 40 mg daily.   #3 anxiety depression and cognitive decline present on admission started on Zoloft by psych on 100 mg daily.  #4 electrolyte abnormalities hypokalemia hypomagnesemia-she withdrew her hands when lab came to draw labs today they will attempt later today.  #5 GERD on Protonix.  #6 hyperlipidemia on high-dose Lipitor 80 mg.  #7 acute stroke with left hemiparesis improving.MRI on 10/10 revealed scattered small acute infarcts in both cerebral hemispheres and brainstem; also noted chronic basal ganglia infarcts and moderately advanced cerebral atrophy. TTE 10/11 with normal EF, impaired LV diastolic filling suggestive of diastolic heart  failure, no thrombus was noted. Carotid ultrasound and transcranial Dopplers were unremarkable. -Neurology signed off 11/05/2018 -Continue Plavix. Completed 3-week course of DAPT with Plavix. -Continue atorvastatin 80 mg p.o. daily -Awaiting SNF.    Nutrition Problem: Inadequate oral intake Etiology: poor appetite     Signs/Symptoms: per patient/family report    Interventions: Prostat, Boost Breeze  Estimated body mass index is 37.09 kg/m as calculated from the following:   Height as of this encounter: 5\' 4"  (1.626 m).   Weight as of this encounter: 98 kg. DVT prophylaxis:Subcu Lovenox Code Status:DNR/DNI Family Communication:Patient and/or RN. Disposition Plan:SNF Consultants:Neurology (off)  Subjective: Patient is resting in bed await SNF no overnight events other than patient withdrew her hand when lab is trying to get blood work done today.  Objective: Vitals:   01/17/19 2320 01/18/19 0111 01/18/19 0340 01/18/19 0805  BP: 130/62  138/63 (!) 130/57  Pulse: 75  73 71  Resp: 19  17 16   Temp: 98.2 F (36.8 C)  98.4 F (36.9 C) 97.6 F (36.4 C)  TempSrc: Oral  Oral Oral  SpO2: 96%  97% 97%  Weight:  98 kg    Height:        Intake/Output Summary (Last 24 hours) at 01/18/2019 1103 Last data filed at 01/18/2019 0958 Gross per 24 hour  Intake 10 ml  Output --  Net 10 ml   Filed Weights   01/15/19 0500 01/16/19 0500 01/18/19 0111  Weight: 97 kg 97 kg 98 kg    Examination:  General exam: Appears calm and comfortable  Respiratory system:  Clear to auscultation. Respiratory effort normal. Cardiovascular system: S1 & S2 heard, RRR. No JVD, murmurs, rubs, gallops or clicks. No pedal edema. Gastrointestinal system: Abdomen is nondistended, soft and nontender. No organomegaly or masses felt. Normal bowel sounds heard. Central nervous system: Awake oriented left sided hemiparesis Extremities: Symmetric 5 x 5 power. Skin: No rashes, lesions or  ulcers Psychiatry: Impaired judgment and insight    Data Reviewed: I have personally reviewed following labs and imaging studies  CBC: Recent Labs  Lab 01/12/19 0535  WBC 9.7  HGB 12.3  HCT 37.3  MCV 92.6  PLT [id number]   Basic Metabolic Panel: Recent Labs  Lab 01/12/19 0535  NA 139  K 3.4*  CL 105  CO2 23  GLUCOSE 167*  BUN 18  CREATININE 0.53  CALCIUM 9.0  MG 1.7   GFR: Estimated Creatinine Clearance: 83.9 mL/min (by C-G formula based on SCr of 0.53 mg/dL). Liver Function Tests: No results for input(s): AST, ALT, ALKPHOS, BILITOT, PROT, ALBUMIN in the last 168 hours. No results for input(s): LIPASE, AMYLASE in the last 168 hours. No results for input(s): AMMONIA in the last 168 hours. Coagulation Profile: No results for input(s): INR, PROTIME in the last 168 hours. Cardiac Enzymes: No results for input(s): CKTOTAL, CKMB, CKMBINDEX, TROPONINI in the last 168 hours. BNP (last 3 results) No results for input(s): PROBNP in the last 8760 hours. HbA1C: No results for input(s): HGBA1C in the last 72 hours. CBG: Recent Labs  Lab 01/17/19 0616 01/17/19 1206 01/17/19 1648 01/17/19 2117 01/18/19 0613  GLUCAP 181* 139* 119* 153* 180*   Lipid Profile: No results for input(s): CHOL, HDL, LDLCALC, TRIG, CHOLHDL, LDLDIRECT in the last 72 hours. Thyroid Function Tests: No results for input(s): TSH, T4TOTAL, FREET4, T3FREE, THYROIDAB in the last 72 hours. Anemia Panel: No results for input(s): VITAMINB12, FOLATE, FERRITIN, TIBC, IRON, RETICCTPCT in the last 72 hours. Sepsis Labs: No results for input(s): PROCALCITON, LATICACIDVEN in the last 168 hours.  No results found for this or any previous visit (from the past 240 hour(s)).       Radiology Studies: No results found.      Scheduled Meds: . amLODipine  10 mg Oral Daily  . atorvastatin  80 mg Oral q1800  . carvedilol  6.25 mg Oral BID WC  . clopidogrel  75 mg Oral Daily  . enoxaparin (LOVENOX) injection   40 mg Subcutaneous Q24H  . feeding supplement  1 Container Oral BID BM  . feeding supplement (PRO-STAT SUGAR FREE 64)  30 mL Oral TID  . insulin aspart  0-15 Units Subcutaneous TID WC  . insulin aspart  0-5 Units Subcutaneous QHS  . insulin aspart  4 Units Subcutaneous TID WC  . insulin glargine  14 Units Subcutaneous QHS  . linagliptin  5 mg Oral Daily  . lisinopril  40 mg Oral Daily  . pantoprazole  40 mg Oral Daily  . sertraline  100 mg Oral Daily  . sodium chloride flush  10-40 mL Intracatheter Q12H  . thiamine  100 mg Oral Daily   Continuous Infusions:   LOS: 76 days     Georgette Shell, MD [facility]  If 7PM-7AM, please contact night-coverage [email or web address] Password [email or web address] 01/18/2019, 11:03 AM

## 2019-01-18 NOTE — Progress Notes (Signed)
The patient's sister Morey Hummingbird was here to visit today and help fix patient's matted hair. Carrie cut the matted knots out of the patient's hair and used a shampoo cap to clean the patient's hair. The patient was agreeable to the hair cut. Patient is agreeable to let the staff comb/brush her hair daily so it does not get matted again.

## 2019-01-18 NOTE — Plan of Care (Signed)
Patient is actively participating in bladder training. Patient will ambulate to bathroom for toileting q2hrs  if asked by staff.

## 2019-01-19 LAB — GLUCOSE, CAPILLARY
Glucose-Capillary: 187 mg/dL — ABNORMAL HIGH (ref 70–99)
Glucose-Capillary: 267 mg/dL — ABNORMAL HIGH (ref 70–99)
Glucose-Capillary: 94 mg/dL (ref 70–99)
Glucose-Capillary: 152 mg/dL — ABNORMAL HIGH (ref 70–99)

## 2019-01-19 MED ORDER — AMLODIPINE BESYLATE 5 MG PO TABS
5.0000 mg | ORAL_TABLET | Freq: Every day | ORAL | Status: DC
  Administered 2019-01-20 – 2019-01-25 (×6): 5 mg via ORAL
  Filled 2019-01-19 (×8): qty 1

## 2019-01-19 NOTE — Progress Notes (Signed)
PROGRESS NOTE    Becky Stout  [id number] DOB: 28-Oct-1955 DOA: 11/03/2018 PCP: Abner Greenspan, MD  Brief Narrative:63 year old female with PMH of DM 2, HTN, WPW syndrome, frequent falls, confusion, and weight loss was admitted for DKA, acute CVA and metabolic encephalopathy. Patient has been medically stable for discharge for several weeks but ongoing prolonged hospital stay due to difficulty in SNF placement. Assessment & Plan:   Principal Problem:   DKA (diabetic ketoacidoses) ([place]) Active Problems:   Hypothyroidism   B12 deficiency   WOLFF (WOLFE)-PARKINSON-WHITE (WPW) SYNDROME   Essential hypertension   Hyperlipidemia   Poorly controlled type 2 diabetes mellitus (HCC)   Anxiety   Failure to thrive in adult   Acute lower UTI   High anion gap metabolic acidosis   Cerebral thrombosis with cerebral infarction   Acute cystitis without hematuria   #1 DKA resolved.  Blood sugar stable.  A1c 7.9%.  Increased Lantus 18 units daily, NovoLog 4 units 3 times a day, SSI, Tradjenta.  Continue statin.  CBG last 24 hours  CBG (last 3)  Recent Labs    01/18/19 2143 01/19/19 0635 01/19/19 1218  GLUCAP 159* 187* 267*     #2 hypertension-blood pressure 121/59 on Norvasc 10 mg daily, Coreg 6.25 mg twice a day.  Continue lisinopril 40 mg daily.   Decrease Norvasc to 5 mg daily.  #3 anxiety depression and cognitive decline present on admission started on Zoloft by psych on 100 mg daily.  #4 electrolyte abnormalities hypokalemia hypomagnesemia-she withdrew her hands when lab came to draw labs today they will attempt later today.  #5 GERD on Protonix.  #6 hyperlipidemia on high-dose Lipitor 80 mg.  #7 acute stroke with left hemiparesis improving.MRI on 10/10 revealed scattered small acute infarcts in both cerebral hemispheres and brainstem; also noted chronic basal ganglia infarcts and moderately advanced cerebral atrophy. TTE 10/11 with normal EF, impaired LV diastolic  filling suggestive of diastolic heart failure, no thrombus was noted. Carotid ultrasound and transcranial Dopplers were unremarkable. -Neurology signed off 11/05/2018 -Continue Plavix. Completed 3-week course of DAPT with Plavix. -Continue atorvastatin 80 mg p.o. daily -Awaiting SNF.   Nutrition Problem: Inadequate oral intake Etiology: poor appetite     Signs/Symptoms: per patient/family report    Interventions: Prostat, Boost Breeze  Estimated body mass index is 37.46 kg/m as calculated from the following:   Height as of this encounter: 5\' 4"  (1.626 m).   Weight as of this encounter: 99 kg.  DVT prophylaxis:Subcu Lovenox Code Status:DNR/DNI Family Communication:none Disposition Plan:SNF Consultants:Neurology (off)    Subjective: No new complaints   Objective: Vitals:   01/18/19 2346 01/19/19 0400 01/19/19 0828 01/19/19 1134  BP: 124/60 (!) 119/58 (!) 124/54 (!) 121/59  Pulse: 68 66 69 72  Resp: 18 18 16 16   Temp: 98.6 F (37 C) 98.4 F (36.9 C) 98 F (36.7 C) 98.6 F (37 C)  TempSrc: Oral Oral Axillary Oral  SpO2: 98% 99% 100% 99%  Weight:  99 kg    Height:        Intake/Output Summary (Last 24 hours) at 01/19/2019 1429 Last data filed at 01/19/2019 1221 Gross per 24 hour  Intake 370 ml  Output --  Net 370 ml   Filed Weights   01/16/19 0500 01/18/19 0111 01/19/19 0400  Weight: 97 kg 98 kg 99 kg    Examination:  General exam: Appears calm and comfortable  Respiratory system: Clear to auscultation. Respiratory effort normal. Cardiovascular system: S1 & S2 heard,  RRR. No JVD, murmurs, rubs, gallops or clicks. No pedal edema. Gastrointestinal system: Abdomen is nondistended, soft and nontender. No organomegaly or masses felt. Normal bowel sounds heard. Central nervous system: Awake alert and oriented.  Left hemiparesis. Extremities: Symmetric 5 x 5 power. Skin: No rashes, lesions or ulcers Psychiatry: Judgement and insight appear normal.  Mood & affect appropriate.     Data Reviewed: I have personally reviewed following labs and imaging studies  CBC: No results for input(s): WBC, NEUTROABS, HGB, HCT, MCV, PLT in the last 168 hours. Basic Metabolic Panel: No results for input(s): NA, K, CL, CO2, GLUCOSE, BUN, CREATININE, CALCIUM, MG, PHOS in the last 168 hours. GFR: Estimated Creatinine Clearance: 84.4 mL/min (by C-G formula based on SCr of 0.53 mg/dL). Liver Function Tests: No results for input(s): AST, ALT, ALKPHOS, BILITOT, PROT, ALBUMIN in the last 168 hours. No results for input(s): LIPASE, AMYLASE in the last 168 hours. No results for input(s): AMMONIA in the last 168 hours. Coagulation Profile: No results for input(s): INR, PROTIME in the last 168 hours. Cardiac Enzymes: No results for input(s): CKTOTAL, CKMB, CKMBINDEX, TROPONINI in the last 168 hours. BNP (last 3 results) No results for input(s): PROBNP in the last 8760 hours. HbA1C: No results for input(s): HGBA1C in the last 72 hours. CBG: Recent Labs  Lab 01/18/19 1217 01/18/19 1639 01/18/19 2143 01/19/19 0635 01/19/19 1218  GLUCAP 183* 93 159* 187* 267*   Lipid Profile: No results for input(s): CHOL, HDL, LDLCALC, TRIG, CHOLHDL, LDLDIRECT in the last 72 hours. Thyroid Function Tests: No results for input(s): TSH, T4TOTAL, FREET4, T3FREE, THYROIDAB in the last 72 hours. Anemia Panel: No results for input(s): VITAMINB12, FOLATE, FERRITIN, TIBC, IRON, RETICCTPCT in the last 72 hours. Sepsis Labs: No results for input(s): PROCALCITON, LATICACIDVEN in the last 168 hours.  No results found for this or any previous visit (from the past 240 hour(s)).       Radiology Studies: No results found.      Scheduled Meds: . amLODipine  10 mg Oral Daily  . atorvastatin  80 mg Oral q1800  . carvedilol  6.25 mg Oral BID WC  . clopidogrel  75 mg Oral Daily  . enoxaparin (LOVENOX) injection  40 mg Subcutaneous Q24H  . feeding supplement  1 Container  Oral BID BM  . feeding supplement (PRO-STAT SUGAR FREE 64)  30 mL Oral TID  . insulin aspart  0-15 Units Subcutaneous TID WC  . insulin aspart  0-5 Units Subcutaneous QHS  . insulin aspart  4 Units Subcutaneous TID WC  . insulin glargine  18 Units Subcutaneous QHS  . linagliptin  5 mg Oral Daily  . lisinopril  40 mg Oral Daily  . pantoprazole  40 mg Oral Daily  . sertraline  100 mg Oral Daily  . sodium chloride flush  10-40 mL Intracatheter Q12H  . thiamine  100 mg Oral Daily   Continuous Infusions:   LOS: 77 days     Georgette Shell, MD [facility] If 7PM-7AM, please contact night-coverage [email or web address] Password [email or web address] 01/19/2019, 2:29 PM

## 2019-01-20 LAB — GLUCOSE, CAPILLARY
Glucose-Capillary: 158 mg/dL — ABNORMAL HIGH (ref 70–99)
Glucose-Capillary: 220 mg/dL — ABNORMAL HIGH (ref 70–99)
Glucose-Capillary: 128 mg/dL — ABNORMAL HIGH (ref 70–99)

## 2019-01-20 NOTE — Progress Notes (Signed)
PROGRESS NOTE    Torrance Decordova Kluger  [id number] DOB: [birth year] DOA: 11/03/2018 PCP: Abner Greenspan, MD  Brief Narrative:63 year old female with PMH of DM 2, HTN, WPW syndrome, frequent falls, confusion, and weight loss was admitted for DKA, acute CVA and metabolic encephalopathy. Patient has been medically stable for discharge for several weeks but ongoing prolonged hospital stay due to difficulty in SNF placement. Assessment & Plan:   Principal Problem:   DKA (diabetic ketoacidoses) ([place]) Active Problems:   Hypothyroidism   B12 deficiency   WOLFF (WOLFE)-PARKINSON-WHITE (WPW) SYNDROME   Essential hypertension   Hyperlipidemia   Poorly controlled type 2 diabetes mellitus (HCC)   Anxiety   Failure to thrive in adult   Acute lower UTI   High anion gap metabolic acidosis   Cerebral thrombosis with cerebral infarction   Acute cystitis without hematuria   #1 DKA resolved.  Blood sugar stable.  A1c 7.9%.  Increased Lantus 18 units daily, NovoLog 4 units 3 times a day, SSI, Tradjenta.  Continue statin.  CBG last 24 hours  CBG (last 3)  Recent Labs    01/20/19 0633 01/20/19 1122 01/20/19 1654  GLUCAP 158* 220* 128*     #2 hypertension-blood pressure 121/59 on Norvasc 10 mg daily, Coreg 6.25 mg twice a day.  Continue lisinopril 40 mg daily.   Decrease Norvasc to 5 mg daily.  #3 anxiety depression and cognitive decline present on admission started on Zoloft by psych on 100 mg daily.  #4 electrolyte abnormalities hypokalemia hypomagnesemia-she withdrew her hands when lab came to draw labs today they will attempt later today.  #5 GERD on Protonix.  #6 hyperlipidemia on high-dose Lipitor 80 mg.  #7 acute stroke with left hemiparesis improving.MRI on 10/10 revealed scattered small acute infarcts in both cerebral hemispheres and brainstem; also noted chronic basal ganglia infarcts and moderately advanced cerebral atrophy. TTE 10/11 with normal EF, impaired LV diastolic  filling suggestive of diastolic heart failure, no thrombus was noted. Carotid ultrasound and transcranial Dopplers were unremarkable. -Neurology signed off 11/05/2018 -Continue Plavix. Completed 3-week course of DAPT with Plavix. -Continue atorvastatin 80 mg p.o. daily -Awaiting SNF.    Estimated body mass index is 37.09 kg/m as calculated from the following:   Height as of this encounter: 5\' 4"  (1.626 m).   Weight as of this encounter: 98 kg.  DVT prophylaxis:Subcu Lovenox Code Status:DNR/DNI Family Communication:none Disposition Plan:SNF Consultants:Neurology (off)    Subjective: No new complaint no nausea no vomiting no fever no chills.  No chest pain.  Objective: Vitals:   01/20/19 0347 01/20/19 0726 01/20/19 1120 01/20/19 1524  BP: (!) 127/54 (!) 121/58 (!) 135/49 (!) 126/57  Pulse: 70 67 67 67  Resp: 18 16 16 20   Temp: 98.7 F (37.1 C) (!) 97.5 F (36.4 C) 98.7 F (37.1 C) 98.7 F (37.1 C)  TempSrc: Oral Oral Oral Oral  SpO2: 98% 100% 99% 98%  Weight:      Height:        Intake/Output Summary (Last 24 hours) at 01/20/2019 1829 Last data filed at 01/20/2019 1655 Gross per 24 hour  Intake 780 ml  Output --  Net 780 ml   Filed Weights   01/18/19 0111 01/19/19 0400 01/20/19 0139  Weight: 98 kg 99 kg 98 kg    Examination:  General exam: Appears calm and comfortable  Respiratory system: Clear to auscultation. Respiratory effort normal. Cardiovascular system: S1 & S2 heard, RRR. No JVD, murmurs, rubs, gallops or clicks. No pedal edema.  Gastrointestinal system: Abdomen is nondistended, soft and nontender. No organomegaly or masses felt. Normal bowel sounds heard. Central nervous system: Awake alert and oriented.  Left hemiparesis. Extremities: Symmetric 5 x 5 power. Skin: No rashes, lesions or ulcers Psychiatry: Judgement and insight appear normal. Mood & affect appropriate.     Data Reviewed: I have personally reviewed following labs and imaging  studies  CBC: No results for input(s): WBC, NEUTROABS, HGB, HCT, MCV, PLT in the last 168 hours. Basic Metabolic Panel: No results for input(s): NA, K, CL, CO2, GLUCOSE, BUN, CREATININE, CALCIUM, MG, PHOS in the last 168 hours. GFR: Estimated Creatinine Clearance: 83.9 mL/min (by C-G formula based on SCr of 0.53 mg/dL). Liver Function Tests: No results for input(s): AST, ALT, ALKPHOS, BILITOT, PROT, ALBUMIN in the last 168 hours. No results for input(s): LIPASE, AMYLASE in the last 168 hours. No results for input(s): AMMONIA in the last 168 hours. Coagulation Profile: No results for input(s): INR, PROTIME in the last 168 hours. Cardiac Enzymes: No results for input(s): CKTOTAL, CKMB, CKMBINDEX, TROPONINI in the last 168 hours. BNP (last 3 results) No results for input(s): PROBNP in the last 8760 hours. HbA1C: No results for input(s): HGBA1C in the last 72 hours. CBG: Recent Labs  Lab 01/19/19 1628 01/19/19 2134 01/20/19 0633 01/20/19 1122 01/20/19 1654  GLUCAP 94 152* 158* 220* 128*   Lipid Profile: No results for input(s): CHOL, HDL, LDLCALC, TRIG, CHOLHDL, LDLDIRECT in the last 72 hours. Thyroid Function Tests: No results for input(s): TSH, T4TOTAL, FREET4, T3FREE, THYROIDAB in the last 72 hours. Anemia Panel: No results for input(s): VITAMINB12, FOLATE, FERRITIN, TIBC, IRON, RETICCTPCT in the last 72 hours. Sepsis Labs: No results for input(s): PROCALCITON, LATICACIDVEN in the last 168 hours.  No results found for this or any previous visit (from the past 240 hour(s)).       Radiology Studies: No results found.      Scheduled Meds: . amLODipine  5 mg Oral Daily  . atorvastatin  80 mg Oral q1800  . carvedilol  6.25 mg Oral BID WC  . clopidogrel  75 mg Oral Daily  . enoxaparin (LOVENOX) injection  40 mg Subcutaneous Q24H  . feeding supplement  1 Container Oral BID BM  . feeding supplement (PRO-STAT SUGAR FREE 64)  30 mL Oral TID  . insulin aspart  0-15  Units Subcutaneous TID WC  . insulin aspart  0-5 Units Subcutaneous QHS  . insulin aspart  4 Units Subcutaneous TID WC  . insulin glargine  18 Units Subcutaneous QHS  . linagliptin  5 mg Oral Daily  . lisinopril  40 mg Oral Daily  . pantoprazole  40 mg Oral Daily  . sertraline  100 mg Oral Daily  . sodium chloride flush  10-40 mL Intracatheter Q12H  . thiamine  100 mg Oral Daily   Continuous Infusions:   LOS: 78 days     Berle Mull, MD [facility] If 7PM-7AM, please contact night-coverage [email or web address] Password [facility] 01/20/2019, 6:29 PM

## 2019-01-21 LAB — GLUCOSE, CAPILLARY
Glucose-Capillary: 121 mg/dL — ABNORMAL HIGH (ref 70–99)
Glucose-Capillary: 133 mg/dL — ABNORMAL HIGH (ref 70–99)
Glucose-Capillary: 244 mg/dL — ABNORMAL HIGH (ref 70–99)
Glucose-Capillary: 130 mg/dL — ABNORMAL HIGH (ref 70–99)

## 2019-01-21 NOTE — Progress Notes (Signed)
PROGRESS NOTE    Becky Stout  [id number] DOB: 1955/08/21 DOA: 11/03/2018 PCP: Abner Greenspan, MD   Brief Narrative:   63 year old lady with prior history of hypertension, type 2 diabetes, WPW syndrome, confusion, frequent falls admitted for DKA, acute CVA and metabolic encephalopathy.  All the active issues have been resolved and patient is currently waiting for discharge to SNF. Assessment & Plan:   Principal Problem:   DKA (diabetic ketoacidoses) ([place]) Active Problems:   Hypothyroidism   B12 deficiency   WOLFF (WOLFE)-PARKINSON-WHITE (WPW) SYNDROME   Essential hypertension   Hyperlipidemia   Poorly controlled type 2 diabetes mellitus (HCC)   Anxiety   Failure to thrive in adult   Acute lower UTI   High anion gap metabolic acidosis   Cerebral thrombosis with cerebral infarction   Acute cystitis without hematuria    Diabetic ketoacidosis Resolved Hemoglobin A1c at 7.9. CBG (last 3)  Recent Labs    01/20/19 1654 01/20/19 2103 01/21/19 0635  GLUCAP 128* 121* 133*   CBGs have been well controlled.  Continue with Lantus and sliding scale insulin and NovoLog 4 units 3 times daily AC along with linagliptin 5 mg daily.[name]  No changes in the medications.    Hypertension Well-controlled blood pressure parameters continue with Norvasc 5 mg daily, lisinopril 40 mg daily.  Coreg 6.25 mg twice daily, as needed hydralazine.   Hyperlipidemia Continue with statin.    History of hypokalemia and hypo-Magness anemia Replaced  no new labs in the last 3 weeks.   History of acute stroke with left hemiparesis MRI of the brain showed scattered small acute infarcts in both cerebral hemispheres and brainstem along with chronic basal ganglia infarction moderately advanced cerebral atrophy. Echocardiogram showed normal left ventricular ejection fraction with impaired left ventricular diastolic filling suggestive of diastolic heart failure.  Care Carotid duplex  unremarkable Transcranial Dopplers were unremarkable. Completed 3 weeks of Plavix Continue with statin. Neurology signed OFF   History of anxiety, depression Continue with Zoloft.   GERD Stable  DVT prophylaxis: Lovenox.  Code Status: DNR Family Communication: None at bedside) Disposition Plan: Pending social worker work-up  Consultants:   Neurology signed off   Procedures: None  Antimicrobials:none  Subjective: No new complaints at this time patient wants to go home. No chest pain, shortness of breath, nausea or vomiting.  Objective: Vitals:   01/20/19 2320 01/21/19 0346 01/21/19 0455 01/21/19 0726  BP: (!) 125/51 (!) 149/69  136/64  Pulse: 73 74  73  Resp: 18 18  16   Temp: 98.4 F (36.9 C) 97.6 F (36.4 C)  98.5 F (36.9 C)  TempSrc: Oral Oral  Oral  SpO2: 97% 100%  99%  Weight:   98.6 kg   Height:        Intake/Output Summary (Last 24 hours) at 01/21/2019 0946 Last data filed at 01/21/2019 0729 Gross per 24 hour  Intake 937 ml  Output --  Net 937 ml   Filed Weights   01/19/19 0400 01/20/19 0139 01/21/19 0455  Weight: 99 kg 98 kg 98.6 kg    Examination:  General exam: Appears calm and comfortable  Respiratory system: Clear to auscultation. Respiratory effort normal. Cardiovascular system: S1 & S2 heard, RRR. No pedal edema. Gastrointestinal system: Abdomen is nondistended, soft and nontender. Normal bowel sounds heard. Central nervous system: Alert and oriented.  Left hemiparesis Extremities: Symmetric 5 x 5 power. Skin: No rashes, lesions or ulcers Psychiatry:  Mood & affect appropriate.     Data  Reviewed: I have personally reviewed following labs and imaging studies  CBC: No results for input(s): WBC, NEUTROABS, HGB, HCT, MCV, PLT in the last 168 hours. Basic Metabolic Panel: No results for input(s): NA, K, CL, CO2, GLUCOSE, BUN, CREATININE, CALCIUM, MG, PHOS in the last 168 hours. GFR: Estimated Creatinine Clearance: 84.3 mL/min (by  C-G formula based on SCr of 0.53 mg/dL). Liver Function Tests: No results for input(s): AST, ALT, ALKPHOS, BILITOT, PROT, ALBUMIN in the last 168 hours. No results for input(s): LIPASE, AMYLASE in the last 168 hours. No results for input(s): AMMONIA in the last 168 hours. Coagulation Profile: No results for input(s): INR, PROTIME in the last 168 hours. Cardiac Enzymes: No results for input(s): CKTOTAL, CKMB, CKMBINDEX, TROPONINI in the last 168 hours. BNP (last 3 results) No results for input(s): PROBNP in the last 8760 hours. HbA1C: No results for input(s): HGBA1C in the last 72 hours. CBG: Recent Labs  Lab 01/20/19 0633 01/20/19 1122 01/20/19 1654 01/20/19 2103 01/21/19 0635  GLUCAP 158* 220* 128* 121* 133*   Lipid Profile: No results for input(s): CHOL, HDL, LDLCALC, TRIG, CHOLHDL, LDLDIRECT in the last 72 hours. Thyroid Function Tests: No results for input(s): TSH, T4TOTAL, FREET4, T3FREE, THYROIDAB in the last 72 hours. Anemia Panel: No results for input(s): VITAMINB12, FOLATE, FERRITIN, TIBC, IRON, RETICCTPCT in the last 72 hours. Sepsis Labs: No results for input(s): PROCALCITON, LATICACIDVEN in the last 168 hours.  No results found for this or any previous visit (from the past 240 hour(s)).       Radiology Studies: No results found.      Scheduled Meds: . amLODipine  5 mg Oral Daily  . atorvastatin  80 mg Oral q1800  . carvedilol  6.25 mg Oral BID WC  . clopidogrel  75 mg Oral Daily  . enoxaparin (LOVENOX) injection  40 mg Subcutaneous Q24H  . feeding supplement  1 Container Oral BID BM  . feeding supplement (PRO-STAT SUGAR FREE 64)  30 mL Oral TID  . insulin aspart  0-15 Units Subcutaneous TID WC  . insulin aspart  0-5 Units Subcutaneous QHS  . insulin aspart  4 Units Subcutaneous TID WC  . insulin glargine  18 Units Subcutaneous QHS  . linagliptin  5 mg Oral Daily  . lisinopril  40 mg Oral Daily  . pantoprazole  40 mg Oral Daily  . sertraline  100  mg Oral Daily  . sodium chloride flush  10-40 mL Intracatheter Q12H  . thiamine  100 mg Oral Daily   Continuous Infusions:   LOS: 79 days       Hosie Poisson, MD [facility]

## 2019-01-21 NOTE — Progress Notes (Signed)
Physical Therapy Treatment Patient Details Name: Becky Stout MRN: [id number] DOB: 02-23-1955 Today's Date: 01/21/2019    History of Present Illness 63 y.o. female with Past medical history of B12 deficiency, type II DM not on therapy, HTN, IDA, obesity, WPW syndrome.Pt dx with acute CVA, MRI reveals multiple cerebral and brainstem lacunar infarcts, acute metabolic encepholopathy, Hypertensive emergency, DKA, agorophobia, N/V, hypokalemia, and FTT.      PT Comments    Pt continues to improve physically and making great functional gains.  She is performing transfers and gt training with close supervision.  She continues to require cues for safety when mobilizing.  Pt attempted to wipe her own bottom this session but still required assistance to clean up thoroughly.  She continue to present with poor safety awareness.  Plan next session for continued progression of functional mobility.  Based on her continued need for supervision for safety she is most appropriate for SNF placement.     Follow Up Recommendations  SNF     Equipment Recommendations  Wheelchair (measurements PT);Wheelchair cushion (measurements PT);3in1 (PT);Rolling walker with 5" wheels    Recommendations for Other Services       Precautions / Restrictions Precautions Precautions: Fall Precaution Comments: anxious, very particular but receptive with redirection    Mobility  Bed Mobility Overal bed mobility: Modified Independent                Transfers Overall transfer level: Needs assistance Equipment used: Rolling walker (2 wheeled) Transfers: Sit to/from Stand Sit to Stand: Supervision         General transfer comment: no assistance needed this session to achieve standing.  Ambulation/Gait Ambulation/Gait assistance: Supervision Gait Distance (Feet): 80 Feet Assistive device: Rolling walker (2 wheeled) Gait Pattern/deviations: Step-through pattern Gait velocity: dec   General Gait Details:  Cues for safety with obstacles and for RW safety.   Stairs             Wheelchair Mobility    Modified Rankin (Stroke Patients Only) Modified Rankin (Stroke Patients Only) Pre-Morbid Rankin Score: Moderate disability Modified Rankin: Moderately severe disability     Balance Overall balance assessment: Needs assistance Sitting-balance support: Feet supported Sitting balance-Leahy Scale: Good       Standing balance-Leahy Scale: Fair                              Cognition Arousal/Alertness: Awake/alert Behavior During Therapy: WFL for tasks assessed/performed Overall Cognitive Status: Impaired/Different from baseline Area of Impairment: Orientation;Following commands;Safety/judgement;Problem solving                 Orientation Level: Situation;Disoriented to     Following Commands: Follows one step commands inconsistently Safety/Judgement: Decreased awareness of safety   Problem Solving: Requires verbal cues General Comments: Pt is more recpetive to intervention.  She really wants to go home and asks every session if the doctor will let her.  She is progressing toward goals but still has poor safety and decreased ability to perform ADLs with out step by step instruction.      Exercises      General Comments        Pertinent Vitals/Pain Pain Assessment: No/denies pain Faces Pain Scale: No hurt Pain Location: generalized discomfort. Pain Descriptors / Indicators: Burning Pain Intervention(s): Monitored during session;Repositioned    Home Living  Prior Function            PT Goals (current goals can now be found in the care plan section) Acute Rehab PT Goals Patient Stated Goal: to go home Potential to Achieve Goals: Good Progress towards PT goals: Progressing toward goals    Frequency    Min 3X/week      PT Plan Current plan remains appropriate    Co-evaluation              AM-PAC PT  "6 Clicks" Mobility   Outcome Measure  Help needed turning from your back to your side while in a flat bed without using bedrails?: None Help needed moving from lying on your back to sitting on the side of a flat bed without using bedrails?: None Help needed moving to and from a bed to a chair (including a wheelchair)?: A [place] Help needed standing up from a chair using your arms (e.g., wheelchair or bedside chair)?: A [place] Help needed to walk in hospital room?: A [place] Help needed climbing 3-5 steps with a railing? : A [place] 6 Click Score: 20    End of Session Equipment Utilized During Treatment: Gait belt Activity Tolerance: Patient tolerated treatment well Patient left: in bed;with call bell/phone within reach;with bed alarm set Nurse Communication: Mobility status PT Visit Diagnosis: Other abnormalities of gait and mobility (R26.89)     Time: [id number] PT Time Calculation (min) (ACUTE ONLY): 16 min  Charges:  $Gait Training: 8-22 mins                     Erasmo Leventhal , PTA Acute Rehabilitation Services Pager [phone] Office [phone]     Maejor Erven [name] 01/21/2019, 1:56 PM

## 2019-01-22 LAB — GLUCOSE, CAPILLARY
Glucose-Capillary: 130 mg/dL — ABNORMAL HIGH (ref 70–99)
Glucose-Capillary: 166 mg/dL — ABNORMAL HIGH (ref 70–99)
Glucose-Capillary: 102 mg/dL — ABNORMAL HIGH (ref 70–99)
Glucose-Capillary: 132 mg/dL — ABNORMAL HIGH (ref 70–99)

## 2019-01-22 NOTE — Progress Notes (Signed)
PROGRESS NOTE    Becky Stout  [id number] DOB: [birth year] DOA: 11/03/2018 PCP: Abner Greenspan, MD   Brief Narrative:  Patient is a 63 year old Caucasian female with past medical history significant for hypertension, type 2 diabetes, WPW syndrome, confusion and frequent falls.  Patient was admitted with DKA, acute CVA and metabolic encephalopathy.  All the active issues have been resolved and patient is currently waiting for discharge to SNF.  01/22/2019: Patient seen.  Patient is eager to be discharged back home.  I have communicated to the patient and that we will have a discussion with the sister in the morning.  For now, the plan remains to pursue placement.  Apparently, patient lives with her 75 year old mother.    Assessment & Plan:   Principal Problem:   DKA (diabetic ketoacidoses) ([place]) Active Problems:   Hypothyroidism   B12 deficiency   WOLFF (WOLFE)-PARKINSON-WHITE (WPW) SYNDROME   Essential hypertension   Hyperlipidemia   Poorly controlled type 2 diabetes mellitus (HCC)   Anxiety   Failure to thrive in adult   Acute lower UTI   High anion gap metabolic acidosis   Cerebral thrombosis with cerebral infarction   Acute cystitis without hematuria  Diabetic ketoacidosis Resolved Hemoglobin A1c at 7.9. CBG (last 3)  Recent Labs    01/22/19 0638 01/22/19 1139 01/22/19 1656  GLUCAP 130* 166* 102*   CBGs have been well controlled.  Continue with Lantus and sliding scale insulin and NovoLog 4 units 3 times daily AC along with linagliptin 5 mg daily.[name]  No changes in the medications.  Hypertension Well-controlled blood pressure parameters continue with Norvasc 5 mg daily, lisinopril 40 mg daily.  Coreg 6.25 mg twice daily, as needed hydralazine.  Hyperlipidemia Continue with statin.     History of hypokalemia and hypo-Magness anemia Continue to monitor and replete. Last lab work was done on 01/12/2019. Repeat lab work in a.m.  History of acute stroke with  left hemiparesis: MRI of the brain showed scattered small acute infarcts in both cerebral hemispheres and brainstem along with chronic basal ganglia infarction moderately advanced cerebral atrophy. Echocardiogram showed normal left ventricular ejection fraction with impaired left ventricular diastolic filling suggestive of diastolic heart failure.   Carotid duplex unremarkable Transcranial Dopplers were unremarkable. Completed 3 weeks of Plavix Continue with statin. Neurology signed OFF  History of anxiety, depression Continue with Zoloft.  GERD Stable  DVT prophylaxis: Lovenox.  Code Status: DNR Family Communication:   Disposition Plan: Pending    Consultants:   Neurology signed off   Procedures: None  Antimicrobials:none  Subjective: No new complaints  No chest pain No shortness of breath No fever or chills.  Objective: Vitals:   01/22/19 0437 01/22/19 0757 01/22/19 1134 01/22/19 1542  BP: 140/60 (!) 131/53 (!) 117/55 (!) 123/55  Pulse: 72 71 68 65  Resp: 17 15 16 17   Temp: [id number] F (36.6 C) 98.7 F (37.1 C) 98.2 F (36.8 C) 99 F (37.2 C)  TempSrc: Oral Oral Oral Oral  SpO2: 97% 99% 100% 100%  Weight:      Height:        Intake/Output Summary (Last 24 hours) at 01/22/2019 1744 Last data filed at 01/22/2019 0350 Gross per 24 hour  Intake 720 ml  Output 1 ml  Net 719 ml   Filed Weights   01/20/19 0139 01/21/19 0455 01/22/19 0128  Weight: 98 kg 98.6 kg 102 kg    Examination:  General exam: Appears calm and comfortable  Respiratory system: Clear  to auscultation. Respiratory effort normal. Cardiovascular system: S1 & S2 heard  Gastrointestinal system: Abdomen is obese, soft and nontender.  Organs are not palpable.    Central nervous system: Alert and oriented.  Patient moves all extremities.   Extremities: No leg edema  Data Reviewed: I have personally reviewed following labs and imaging studies  CBC: No results for input(s): WBC, NEUTROABS,  HGB, HCT, MCV, PLT in the last 168 hours. Basic Metabolic Panel: No results for input(s): NA, K, CL, CO2, GLUCOSE, BUN, CREATININE, CALCIUM, MG, PHOS in the last 168 hours. GFR: Estimated Creatinine Clearance: 85.8 mL/min (by C-G formula based on SCr of 0.53 mg/dL). Liver Function Tests: No results for input(s): AST, ALT, ALKPHOS, BILITOT, PROT, ALBUMIN in the last 168 hours. No results for input(s): LIPASE, AMYLASE in the last 168 hours. No results for input(s): AMMONIA in the last 168 hours. Coagulation Profile: No results for input(s): INR, PROTIME in the last 168 hours. Cardiac Enzymes: No results for input(s): CKTOTAL, CKMB, CKMBINDEX, TROPONINI in the last 168 hours. BNP (last 3 results) No results for input(s): PROBNP in the last 8760 hours. HbA1C: No results for input(s): HGBA1C in the last 72 hours. CBG: Recent Labs  Lab 01/21/19 1136 01/21/19 2141 01/22/19 0638 01/22/19 1139 01/22/19 1656  GLUCAP 244* 130* 130* 166* 102*   Lipid Profile: No results for input(s): CHOL, HDL, LDLCALC, TRIG, CHOLHDL, LDLDIRECT in the last 72 hours. Thyroid Function Tests: No results for input(s): TSH, T4TOTAL, FREET4, T3FREE, THYROIDAB in the last 72 hours. Anemia Panel: No results for input(s): VITAMINB12, FOLATE, FERRITIN, TIBC, IRON, RETICCTPCT in the last 72 hours. Sepsis Labs: No results for input(s): PROCALCITON, LATICACIDVEN in the last 168 hours.  No results found for this or any previous visit (from the past 240 hour(s)).   Radiology Studies: No results found.  Scheduled Meds: . amLODipine  5 mg Oral Daily  . atorvastatin  80 mg Oral q1800  . carvedilol  6.25 mg Oral BID WC  . clopidogrel  75 mg Oral Daily  . enoxaparin (LOVENOX) injection  40 mg Subcutaneous Q24H  . feeding supplement  1 Container Oral BID BM  . feeding supplement (PRO-STAT SUGAR FREE 64)  30 mL Oral TID  . insulin aspart  0-15 Units Subcutaneous TID WC  . insulin aspart  0-5 Units Subcutaneous QHS    . insulin aspart  4 Units Subcutaneous TID WC  . insulin glargine  18 Units Subcutaneous QHS  . linagliptin  5 mg Oral Daily  . lisinopril  40 mg Oral Daily  . pantoprazole  40 mg Oral Daily  . sertraline  100 mg Oral Daily  . sodium chloride flush  10-40 mL Intracatheter Q12H  . thiamine  100 mg Oral Daily   Continuous Infusions:   LOS: 80 days    Bonnell Public, MD [facility]

## 2019-01-22 NOTE — TOC Progression Note (Signed)
Transition of Care [facility]) - Progression Note    Patient Details  Name: Becky Stout MRN: [id number] Date of Birth: 1956-01-16  Transition of Care [facility]) CM/SW [place], [place] Phone Number: 01/22/2019, 10:05 AM  Clinical Narrative:   CSW continuing to follow for SNF placement. Family continuing to work on obtaining funds for possible private pay for SNF with patient's 401K, and patient continues to have no bed offers for SNF. CSW to follow.    Expected Discharge Plan: [place] Barriers to Discharge: Inadequate or no insurance  Expected Discharge Plan and Services Expected Discharge Plan: [place] In-house Referral: Clinical Social Work Discharge Planning Services: CM Consult Post Acute Care Choice: [place] arrangements for the past 2 months: Single Family Home                                       Social Determinants of Health (SDOH) Interventions    Readmission Risk Interventions No flowsheet data found.

## 2019-01-23 LAB — GLUCOSE, CAPILLARY
Glucose-Capillary: 158 mg/dL — ABNORMAL HIGH (ref 70–99)
Glucose-Capillary: 169 mg/dL — ABNORMAL HIGH (ref 70–99)
Glucose-Capillary: 86 mg/dL (ref 70–99)
Glucose-Capillary: 118 mg/dL — ABNORMAL HIGH (ref 70–99)

## 2019-01-23 NOTE — Progress Notes (Signed)
Physical Therapy Treatment Patient Details Name: Becky Stout MRN: [id number] DOB: 09/05/1955 Today's Date: 01/23/2019    History of Present Illness 63 y.o. female with Past medical history of B12 deficiency, type II DM not on therapy, HTN, IDA, obesity, WPW syndrome.Pt dx with acute CVA, MRI reveals multiple cerebral and brainstem lacunar infarcts, acute metabolic encepholopathy, Hypertensive emergency, DKA, agorophobia, N/V, hypokalemia, and FTT.      PT Comments    Pt progressing steadily towards physical therapy goals with encouragement needed to participate. Ambulating 200 feet with walker at a supervision level. Pt continues with decreased cognition including decreased awareness of deficits and ability to verbalize basic needs. Would need to be at a modified independent level with mobility and ADL's to discharge home with mother; extensive discussion with pt and pt sister regarding this. Continue to recommend SNF currently.    Follow Up Recommendations  SNF     Equipment Recommendations  3in1 (PT);Rolling walker with 5" wheels    Recommendations for Other Services       Precautions / Restrictions Precautions Precautions: Fall Precaution Comments: anxious, very particular but receptive with redirection Restrictions Weight Bearing Restrictions: No    Mobility  Bed Mobility Overal bed mobility: Modified Independent                Transfers Overall transfer level: Needs assistance Equipment used: Rolling walker (2 wheeled) Transfers: Sit to/from Stand Sit to Stand: Supervision            Ambulation/Gait Ambulation/Gait assistance: Supervision Gait Distance (Feet): 200 Feet Assistive device: Rolling walker (2 wheeled) Gait Pattern/deviations: Step-through pattern;Wide base of support Gait velocity: decreased   General Gait Details: Pt with slow and steady pace, utilizing wide BOS   Stairs             Wheelchair Mobility    Modified Rankin  (Stroke Patients Only) Modified Rankin (Stroke Patients Only) Pre-Morbid Rankin Score: Moderate disability Modified Rankin: Moderately severe disability     Balance Overall balance assessment: Needs assistance Sitting-balance support: Feet supported Sitting balance-Leahy Scale: Good     Standing balance support: Bilateral upper extremity supported Standing balance-Leahy Scale: Fair                              Cognition Arousal/Alertness: Awake/alert Behavior During Therapy: WFL for tasks assessed/performed Overall Cognitive Status: Impaired/Different from baseline Area of Impairment: Attention;Safety/judgement;Awareness                   Current Attention Level: Sustained     Safety/Judgement: Decreased awareness of deficits Awareness: Emergent Problem Solving: Requires verbal cues General Comments: Pt needs cues to verbalize basic needs i.e. need to use the bathroom. Still with poor understanding of deficits despite education.      Exercises      General Comments        Pertinent Vitals/Pain Pain Assessment: Faces Faces Pain Scale: No hurt    Home Living                      Prior Function            PT Goals (current goals can now be found in the care plan section) Acute Rehab PT Goals Patient Stated Goal: to go home PT Goal Formulation: With patient Time For Goal Achievement: 02/06/19 Potential to Achieve Goals: Good Progress towards PT goals: Progressing toward goals    Frequency  Min 3X/week      PT Plan Current plan remains appropriate    Co-evaluation              AM-PAC PT "6 Clicks" Mobility   Outcome Measure  Help needed turning from your back to your side while in a flat bed without using bedrails?: None Help needed moving from lying on your back to sitting on the side of a flat bed without using bedrails?: None Help needed moving to and from a bed to a chair (including a wheelchair)?: None Help  needed standing up from a chair using your arms (e.g., wheelchair or bedside chair)?: None Help needed to walk in hospital room?: None Help needed climbing 3-5 steps with a railing? : A Mis 6 Click Score: 23    End of Session Equipment Utilized During Treatment: Gait belt Activity Tolerance: Patient tolerated treatment well Patient left: in chair;with call bell/phone within reach;with chair alarm set;with family/visitor present Nurse Communication: Mobility status PT Visit Diagnosis: Other abnormalities of gait and mobility (R26.89)     Time: [id number] PT Time Calculation (min) (ACUTE ONLY): 26 min  Charges:  $Therapeutic Activity: 8-22 mins $Self Care/Home Management: 8-22                     [name], PT, DPT Acute Rehabilitation Services Pager [phone] Office [phone]    [name] 01/23/2019, 12:25 PM

## 2019-01-23 NOTE — Progress Notes (Signed)
PROGRESS NOTE    Becky Stout  [id number] DOB: 1955-10-07 DOA: 11/03/2018 PCP: Abner Greenspan, MD   Brief Narrative:  Patient is a 63 year old Caucasian female with past medical history significant for hypertension, type 2 diabetes, WPW syndrome, confusion and frequent falls.  Patient was admitted with DKA, acute CVA and metabolic encephalopathy.  All the active issues have been resolved and patient is currently waiting for discharge to SNF.  01/22/2019: Patient seen.  Patient is eager to be discharged back home.  I have communicated to the patient and that we will have a discussion with the sister in the morning.  For now, the plan remains to pursue placement.  Apparently, patient lives with her 64 year old mother.  01/23/2019: Patient seen alongside patient's sister, Morey Hummingbird; patient's nurse and physical therapist.  Updated patient's sister.  Discussed extensively with the patient.  Explained need for optimization prior to eventually discharged back to an 84 year old mother to the patient.  Pursue disposition as planned.  Assessment & Plan:   Principal Problem:   DKA (diabetic ketoacidoses) ([place]) Active Problems:   Hypothyroidism   B12 deficiency   WOLFF (WOLFE)-PARKINSON-WHITE (WPW) SYNDROME   Essential hypertension   Hyperlipidemia   Poorly controlled type 2 diabetes mellitus (HCC)   Anxiety   Failure to thrive in adult   Acute lower UTI   High anion gap metabolic acidosis   Cerebral thrombosis with cerebral infarction   Acute cystitis without hematuria  Diabetic ketoacidosis Resolved Hemoglobin A1c at 7.9. CBG (last 3)  Recent Labs    01/23/19 0621 01/23/19 1150 01/23/19 1642  GLUCAP 158* 169* 86   CBGs have been well controlled.  Continue with Lantus and sliding scale insulin and NovoLog 4 units 3 times daily AC along with linagliptin 5 mg daily.[name]  No changes in the medications.  Hypertension -Blood pressure is at goal.   -Continue Norvasc-BP once daily,  Coreg 6.25 Mg p.o. twice daily and lisinopril 40 Mg p.o. once daily.   -Goal blood pressure is below 130/80 mmHg.  Hyperlipidemia -Continue statin.  Patient is currently on Lipitor 80 mg p.o. once daily.     History of hypokalemia and hypo-Magness anemia Check renal panel and magnesium in the morning. Last lab work was done on 01/12/2019.  History of acute stroke with left hemiparesis: MRI of the brain showed scattered small acute infarcts in both cerebral hemispheres and brainstem along with chronic basal ganglia infarction moderately advanced cerebral atrophy. Echocardiogram showed normal left ventricular ejection fraction with impaired left ventricular diastolic filling suggestive of diastolic heart failure.   Carotid duplex unremarkable Transcranial Dopplers were unremarkable. Completed 3 weeks of Plavix Continue with statin. Neurology signed OFF  History of anxiety, depression Continue with Zoloft.  GERD Stable  DVT prophylaxis: Lovenox.  Code Status: DNR Family Communication:   Disposition Plan: Pending    Consultants:   Neurology signed off   Procedures: None  Antimicrobials:none  Subjective: No new complaints  No chest pain No shortness of breath No fever or chills.  Objective: Vitals:   01/23/19 0349 01/23/19 0849 01/23/19 1151 01/23/19 1523  BP: 135/61 129/61 135/64 (!) 128/54  Pulse: 76 67 72 65  Resp: 17 16 16 16   Temp: 97.9 F (36.6 C) 98.5 F (36.9 C) (!) 97.4 F (36.3 C) 98.6 F (37 C)  TempSrc: Oral Oral Oral Oral  SpO2: 98% 100% 100% 100%  Weight:      Height:        Intake/Output Summary (Last 24 hours)  at 01/23/2019 1745 Last data filed at 01/22/2019 2044 Gross per 24 hour  Intake 120 ml  Output --  Net 120 ml   Filed Weights   01/20/19 0139 01/21/19 0455 01/22/19 0128  Weight: 98 kg 98.6 kg 102 kg    Examination:  General exam: Appears calm and comfortable  Respiratory system: Clear to auscultation. Respiratory effort  normal. Cardiovascular system: S1 & S2 heard  Gastrointestinal system: Abdomen is obese, soft and nontender.  Organs are not palpable.    Central nervous system: Alert and oriented.  Patient moves all extremities.   Extremities: No leg edema  Data Reviewed: I have personally reviewed following labs and imaging studies  CBC: No results for input(s): WBC, NEUTROABS, HGB, HCT, MCV, PLT in the last 168 hours. Basic Metabolic Panel: No results for input(s): NA, K, CL, CO2, GLUCOSE, BUN, CREATININE, CALCIUM, MG, PHOS in the last 168 hours. GFR: Estimated Creatinine Clearance: 85.8 mL/min (by C-G formula based on SCr of 0.53 mg/dL). Liver Function Tests: No results for input(s): AST, ALT, ALKPHOS, BILITOT, PROT, ALBUMIN in the last 168 hours. No results for input(s): LIPASE, AMYLASE in the last 168 hours. No results for input(s): AMMONIA in the last 168 hours. Coagulation Profile: No results for input(s): INR, PROTIME in the last 168 hours. Cardiac Enzymes: No results for input(s): CKTOTAL, CKMB, CKMBINDEX, TROPONINI in the last 168 hours. BNP (last 3 results) No results for input(s): PROBNP in the last 8760 hours. HbA1C: No results for input(s): HGBA1C in the last 72 hours. CBG: Recent Labs  Lab 01/22/19 1656 01/22/19 2106 01/23/19 0621 01/23/19 1150 01/23/19 1642  GLUCAP 102* 132* 158* 169* 86   Lipid Profile: No results for input(s): CHOL, HDL, LDLCALC, TRIG, CHOLHDL, LDLDIRECT in the last 72 hours. Thyroid Function Tests: No results for input(s): TSH, T4TOTAL, FREET4, T3FREE, THYROIDAB in the last 72 hours. Anemia Panel: No results for input(s): VITAMINB12, FOLATE, FERRITIN, TIBC, IRON, RETICCTPCT in the last 72 hours. Sepsis Labs: No results for input(s): PROCALCITON, LATICACIDVEN in the last 168 hours.  No results found for this or any previous visit (from the past 240 hour(s)).   Radiology Studies: No results found.  Scheduled Meds: . amLODipine  5 mg Oral Daily  .  atorvastatin  80 mg Oral q1800  . carvedilol  6.25 mg Oral BID WC  . clopidogrel  75 mg Oral Daily  . enoxaparin (LOVENOX) injection  40 mg Subcutaneous Q24H  . feeding supplement  1 Container Oral BID BM  . feeding supplement (PRO-STAT SUGAR FREE 64)  30 mL Oral TID  . insulin aspart  0-15 Units Subcutaneous TID WC  . insulin aspart  0-5 Units Subcutaneous QHS  . insulin aspart  4 Units Subcutaneous TID WC  . insulin glargine  18 Units Subcutaneous QHS  . linagliptin  5 mg Oral Daily  . lisinopril  40 mg Oral Daily  . pantoprazole  40 mg Oral Daily  . sertraline  100 mg Oral Daily  . sodium chloride flush  10-40 mL Intracatheter Q12H  . thiamine  100 mg Oral Daily   Continuous Infusions:   LOS: 81 days    Bonnell Public, MD [facility]

## 2019-01-23 NOTE — Progress Notes (Signed)
Occupational Therapy Treatment Patient Details Name: Becky Stout MRN: [id number] DOB: [birth year] Today's Date: 01/23/2019    History of present illness 63 y.o. female with Past medical history of B12 deficiency, type II DM not on therapy, HTN, IDA, obesity, WPW syndrome.Pt dx with acute CVA, MRI reveals multiple cerebral and brainstem lacunar infarcts, acute metabolic encepholopathy, Hypertensive emergency, DKA, agorophobia, N/V, hypokalemia, and FTT.     OT comments  Pt making steady progress towards OT goals this session. Session focus on functional mobility, LB bathing/ dressing, toilet transfer/ hygiene. RN and NT assisting pt with bath upon arrival. Pt agreeable to complete bath with OTA. Pt completed LB bathing with supervision/ set-up and requires MOD A for LB dressing to don underwear. Pt completed standing grooming with supervision. Pt complete toilet transfer/ hygiene with supervision- min guard with RW. Pt continues to be limited by cognitive deficits and is particular about level of care. Continue to encourage pt to verbalize needs as pt needs to be continent to potentially progress to [facility].  Continue to recommend SNF currently, but hopeful pt will progress to [facility]. Will follow.    Follow Up Recommendations  SNF;Other (comment)(progressing to home with [facility], but has to continent)    Equipment Recommendations  Other (comment)(defer to next venue of care)    Recommendations for Other Services      Precautions / Restrictions Precautions Precautions: Fall Precaution Comments: anxious, very particular but receptive with redirection Restrictions Weight Bearing Restrictions: No       Mobility Bed Mobility Overal bed mobility: Modified Independent Bed Mobility: Sit to Supine       Sit to supine: Modified independent (Device/Increase time)   General bed mobility comments: No assistance needed.  Transfers Overall transfer level: Needs assistance Equipment used: Rolling walker  (2 wheeled) Transfers: Sit to/from Stand Sit to Stand: Supervision         General transfer comment: supervision from toilet    Balance Overall balance assessment: Needs assistance Sitting-balance support: Feet supported Sitting balance-Leahy Scale: Good     Standing balance support: Single extremity supported;During functional activity Standing balance-Leahy Scale: Poor Standing balance comment: at least one UE supported during functional tasks                           ADL either performed or assessed with clinical judgement   ADL Overall ADL's : Needs assistance/impaired     Grooming: Oral care;Standing;Supervision/safety;Set up       Lower Body Bathing: Supervison/ safety;Set up;Sit to/from stand Lower Body Bathing Details (indicate cue type and reason): pt able to complete minimal LB bathing but able to initiate with supervision/ set- up Upper Body Dressing : Minimal assistance;Sitting   Lower Body Dressing: Sit to/from stand;Moderate assistance Lower Body Dressing Details (indicate cue type and reason): MOD A to don underwear on BLEs but able to pull up to waist line via sit>stand Toilet Transfer: Min guard;BSC;Regular Toilet;RW;Ambulation;Grab bars Toilet Transfer Details (indicate cue type and reason): min guard for safety with RW, 3n1 over regular toilet Toileting- Clothing Manipulation and Hygiene: Supervision/safety;Sitting/lateral lean;Sit to/from stand       Functional mobility during ADLs: Min guard;Rolling walker General ADL Comments: session focus on functional mobility, LB bathing/dressing, standing grooming, toilet transfer/ hygiene     Vision Baseline Vision/History: Wears glasses Wears Glasses: At all times Patient Visual Report: No change from baseline     Perception     Praxis  Cognition Arousal/Alertness: Awake/alert Behavior During Therapy: WFL for tasks assessed/performed Overall Cognitive Status: Impaired/Different  from baseline Area of Impairment: Attention;Safety/judgement;Awareness;Problem solving                   Current Attention Level: Sustained     Safety/Judgement: Decreased awareness of deficits Awareness: Emergent Problem Solving: Requires verbal cues General Comments: pt with decreased awareness of deficits, requires continued cues to verbalize needs.        Exercises     Shoulder Instructions       General Comments      Pertinent Vitals/ Pain       Pain Assessment: No/denies pain Faces Pain Scale: No hurt  Home Living                                          Prior Functioning/Environment              Frequency  Min 1X/week        Progress Toward Goals  OT Goals(current goals can now be found in the care plan section)  Progress towards OT goals: Progressing toward goals  Acute Rehab OT Goals Patient Stated Goal: to go home OT Goal Formulation: With patient Time For Goal Achievement: 01/23/19 Potential to Achieve Goals: [place] Discharge plan remains appropriate    Co-evaluation                 AM-PAC OT "6 Clicks" Daily Activity     Outcome Measure   Help from another person eating meals?: A [place] Help from another person taking care of personal grooming?: A Lot Help from another person toileting, which includes using toliet, bedpan, or urinal?: A Lot Help from another person bathing (including washing, rinsing, drying)?: A Lot Help from another person to put on and taking off regular upper body clothing?: A Lot Help from another person to put on and taking off regular lower body clothing?: A Lot 6 Click Score: 13    End of Session Equipment Utilized During Treatment: Rolling walker  OT Visit Diagnosis: Unsteadiness on feet (R26.81);Other abnormalities of gait and mobility (R26.89);Muscle weakness (generalized) (M62.81);History of falling (Z91.81);Other symptoms and signs involving cognitive function;Adult,  failure to thrive (R62.7)   Activity Tolerance Patient tolerated treatment well   Patient Left in bed;with call bell/phone within reach;with bed alarm set   Nurse Communication Mobility status        Time: 1050-1104 OT Time Calculation (min): 14 min  Charges: OT General Charges $OT Visit: 1 Visit OT Treatments $Self Care/Home Management : 8-22 mins  Lanier Clam., COTA/L Acute Rehabilitation Services [phone]    Ihor Gully 01/23/2019, 1:22 PM

## 2019-01-24 LAB — GLUCOSE, CAPILLARY
Glucose-Capillary: 123 mg/dL — ABNORMAL HIGH (ref 70–99)
Glucose-Capillary: 166 mg/dL — ABNORMAL HIGH (ref 70–99)
Glucose-Capillary: 81 mg/dL (ref 70–99)
Glucose-Capillary: 152 mg/dL — ABNORMAL HIGH (ref 70–99)

## 2019-01-24 NOTE — Progress Notes (Signed)
PROGRESS NOTE    Becky Stout  [id number] DOB: 05-Nov-1955 DOA: 11/03/2018 PCP: Abner Greenspan, MD   Brief Narrative:  Patient is a 63 year old Caucasian female with past medical history significant for hypertension, type 2 diabetes, WPW syndrome, confusion and frequent falls.  Patient was admitted with DKA, acute CVA and metabolic encephalopathy.  All the active issues have been resolved and patient is currently waiting for discharge to SNF.  01/22/2019: Patient seen.  Patient is eager to be discharged back home.  I have communicated to the patient and that we will have a discussion with the sister in the morning.  For now, the plan remains to pursue placement.  Apparently, patient lives with her 1 year old mother.  01/23/2019: Patient seen alongside patient's sister, Morey Hummingbird; patient's nurse and physical therapist.  Updated patient's sister.  Discussed extensively with the patient.  Explained need for optimization prior to eventually discharged back to an 5 year old mother to the patient.  Pursue disposition as planned.  01/24/2019: Patient seen today.  No new changes.  Pursue disposition.  Assessment & Plan:   Principal Problem:   DKA (diabetic ketoacidoses) ([place]) Active Problems:   Hypothyroidism   B12 deficiency   WOLFF (WOLFE)-PARKINSON-WHITE (WPW) SYNDROME   Essential hypertension   Hyperlipidemia   Poorly controlled type 2 diabetes mellitus (HCC)   Anxiety   Failure to thrive in adult   Acute lower UTI   High anion gap metabolic acidosis   Cerebral thrombosis with cerebral infarction   Acute cystitis without hematuria  Diabetic ketoacidosis Resolved Hemoglobin A1c at 7.9. CBG (last 3)  Recent Labs    01/23/19 2130 01/24/19 0617 01/24/19 1223  GLUCAP 118* 123* 166*   CBGs have been well controlled.  Continue with Lantus and sliding scale insulin and NovoLog 4 units 3 times daily AC along with linagliptin 5 mg daily.[name]  No changes in the  medications.  Hypertension -Blood pressure is at goal.   -Continue Norvasc-BP once daily, Coreg 6.25 Mg p.o. twice daily and lisinopril 40 Mg p.o. once daily.   -Goal blood pressure is below 130/80 mmHg.  Hyperlipidemia -Continue statin.  Patient is currently on Lipitor 80 mg p.o. once daily.     History of hypokalemia and hypo-Magness anemia Check renal panel and magnesium in the morning. Last lab work was done on 01/12/2019.  History of acute stroke with left hemiparesis: MRI of the brain showed scattered small acute infarcts in both cerebral hemispheres and brainstem along with chronic basal ganglia infarction moderately advanced cerebral atrophy. Echocardiogram showed normal left ventricular ejection fraction with impaired left ventricular diastolic filling suggestive of diastolic heart failure.   Carotid duplex unremarkable Transcranial Dopplers were unremarkable. Completed 3 weeks of Plavix Continue with statin. Neurology signed OFF  History of anxiety, depression Continue with Zoloft.  GERD Stable  DVT prophylaxis: Lovenox.  Code Status: DNR Family Communication:   Disposition Plan: Pending    Consultants:   Neurology signed off   Procedures: None  Antimicrobials:none  Subjective: No new complaints  No chest pain No shortness of breath No fever or chills.  Objective: Vitals:   01/24/19 0403 01/24/19 0826 01/24/19 1226 01/24/19 1521  BP: 133/68 140/65 (!) 138/54 (!) 132/55  Pulse: 70 72 67 64  Resp: 16 12 16 16   Temp: 98.4 F (36.9 C) 97.8 F (36.6 C) 98.5 F (36.9 C) 98.8 F (37.1 C)  TempSrc: Oral Oral Oral Oral  SpO2: 99% 100% 100% 100%  Weight:      Height:  Intake/Output Summary (Last 24 hours) at 01/24/2019 1615 Last data filed at 01/24/2019 1240 Gross per 24 hour  Intake 600 ml  Output --  Net 600 ml   Filed Weights   01/20/19 0139 01/21/19 0455 01/22/19 0128  Weight: 98 kg 98.6 kg 102 kg    Examination:  General exam:  Appears calm and comfortable  Respiratory system: Clear to auscultation. Respiratory effort normal. Cardiovascular system: S1 & S2 heard  Gastrointestinal system: Abdomen is obese, soft and nontender.  Organs are not palpable.    Central nervous system: Alert and oriented.  Patient moves all extremities.   Extremities: No leg edema  Data Reviewed: I have personally reviewed following labs and imaging studies  CBC: No results for input(s): WBC, NEUTROABS, HGB, HCT, MCV, PLT in the last 168 hours. Basic Metabolic Panel: No results for input(s): NA, K, CL, CO2, GLUCOSE, BUN, CREATININE, CALCIUM, MG, PHOS in the last 168 hours. GFR: Estimated Creatinine Clearance: 85.8 mL/min (by C-G formula based on SCr of 0.53 mg/dL). Liver Function Tests: No results for input(s): AST, ALT, ALKPHOS, BILITOT, PROT, ALBUMIN in the last 168 hours. No results for input(s): LIPASE, AMYLASE in the last 168 hours. No results for input(s): AMMONIA in the last 168 hours. Coagulation Profile: No results for input(s): INR, PROTIME in the last 168 hours. Cardiac Enzymes: No results for input(s): CKTOTAL, CKMB, CKMBINDEX, TROPONINI in the last 168 hours. BNP (last 3 results) No results for input(s): PROBNP in the last 8760 hours. HbA1C: No results for input(s): HGBA1C in the last 72 hours. CBG: Recent Labs  Lab 01/23/19 1150 01/23/19 1642 01/23/19 2130 01/24/19 0617 01/24/19 1223  GLUCAP 169* 86 118* 123* 166*   Lipid Profile: No results for input(s): CHOL, HDL, LDLCALC, TRIG, CHOLHDL, LDLDIRECT in the last 72 hours. Thyroid Function Tests: No results for input(s): TSH, T4TOTAL, FREET4, T3FREE, THYROIDAB in the last 72 hours. Anemia Panel: No results for input(s): VITAMINB12, FOLATE, FERRITIN, TIBC, IRON, RETICCTPCT in the last 72 hours. Sepsis Labs: No results for input(s): PROCALCITON, LATICACIDVEN in the last 168 hours.  No results found for this or any previous visit (from the past 240 hour(s)).    Radiology Studies: No results found.  Scheduled Meds: . amLODipine  5 mg Oral Daily  . atorvastatin  80 mg Oral q1800  . carvedilol  6.25 mg Oral BID WC  . clopidogrel  75 mg Oral Daily  . enoxaparin (LOVENOX) injection  40 mg Subcutaneous Q24H  . feeding supplement  1 Container Oral BID BM  . feeding supplement (PRO-STAT SUGAR FREE 64)  30 mL Oral TID  . insulin aspart  0-15 Units Subcutaneous TID WC  . insulin aspart  0-5 Units Subcutaneous QHS  . insulin aspart  4 Units Subcutaneous TID WC  . insulin glargine  18 Units Subcutaneous QHS  . linagliptin  5 mg Oral Daily  . lisinopril  40 mg Oral Daily  . pantoprazole  40 mg Oral Daily  . sertraline  100 mg Oral Daily  . sodium chloride flush  10-40 mL Intracatheter Q12H  . thiamine  100 mg Oral Daily   Continuous Infusions:   LOS: 82 days    Bonnell Public, MD [facility]

## 2019-01-24 NOTE — Plan of Care (Signed)
  Problem: Activity: Goal: Risk for activity intolerance will decrease Outcome: Progressing   

## 2019-01-24 NOTE — Progress Notes (Signed)
Physical Therapy Treatment Patient Details Name: Becky Stout MRN: [id number] DOB: Aug 16, 1955 Today's Date: 01/24/2019    History of Present Illness 63 y.o. female with Past medical history of B12 deficiency, type II DM not on therapy, HTN, IDA, obesity, WPW syndrome.Pt dx with acute CVA, MRI reveals multiple cerebral and brainstem lacunar infarcts, acute metabolic encepholopathy, Hypertensive emergency, DKA, agorophobia, N/V, hypokalemia, and FTT.      PT Comments    Pt only agreeable to ambulate to bathroom today due to reported fatigue. Pt reports she is disappointed she is not going home today or tomorrow. Ambulating with walker at a supervision level, but continues to require assist for ADL's including peri care. Needs continued encouragement for progression.    Follow Up Recommendations  SNF     Equipment Recommendations  3in1 (PT);Rolling walker with 5" wheels    Recommendations for Other Services       Precautions / Restrictions Precautions Precautions: Fall Precaution Comments: anxious, very particular but receptive with redirection Restrictions Weight Bearing Restrictions: No    Mobility  Bed Mobility Overal bed mobility: Modified Independent                Transfers Overall transfer level: Needs assistance Equipment used: Rolling walker (2 wheeled) Transfers: Sit to/from Stand Sit to Stand: Min guard;Min assist         General transfer comment: Pt requiring up to min assist to rise from low bed height; min guard to stand from toilet  Ambulation/Gait Ambulation/Gait assistance: Supervision Gait Distance (Feet): 15 Feet Assistive device: Rolling walker (2 wheeled) Gait Pattern/deviations: Step-through pattern;Wide base of support Gait velocity: decreased   General Gait Details: Pt requiring supervision for safety, increased lateral sway, utilizing wide BOS   Stairs             Wheelchair Mobility    Modified Rankin (Stroke Patients  Only) Modified Rankin (Stroke Patients Only) Pre-Morbid Rankin Score: Moderate disability Modified Rankin: Moderately severe disability     Balance Overall balance assessment: Needs assistance Sitting-balance support: Feet supported Sitting balance-Leahy Scale: Good     Standing balance support: Single extremity supported;During functional activity Standing balance-Leahy Scale: Poor Standing balance comment: at least one UE supported during functional tasks                            Cognition Arousal/Alertness: Awake/alert Behavior During Therapy: WFL for tasks assessed/performed Overall Cognitive Status: Impaired/Different from baseline Area of Impairment: Attention;Safety/judgement;Awareness;Problem solving                   Current Attention Level: Sustained Memory: Decreased short-term memory Following Commands: Follows one step commands inconsistently Safety/Judgement: Decreased awareness of deficits Awareness: Emergent Problem Solving: Requires verbal cues        Exercises      General Comments        Pertinent Vitals/Pain Pain Assessment: Faces Faces Pain Scale: No hurt    Home Living                      Prior Function            PT Goals (current goals can now be found in the care plan section) Acute Rehab PT Goals Patient Stated Goal: to go home Potential to Achieve Goals: Good    Frequency    Min 3X/week      PT Plan Current plan remains appropriate    Co-evaluation  AM-PAC PT "6 Clicks" Mobility   Outcome Measure  Help needed turning from your back to your side while in a flat bed without using bedrails?: None Help needed moving from lying on your back to sitting on the side of a flat bed without using bedrails?: None Help needed moving to and from a bed to a chair (including a wheelchair)?: None Help needed standing up from a chair using your arms (e.g., wheelchair or bedside chair)?:  None Help needed to walk in hospital room?: None Help needed climbing 3-5 steps with a railing? : A Nourse 6 Click Score: 23    End of Session Equipment Utilized During Treatment: Gait belt Activity Tolerance: Other (comment)(self limiting) Patient left: in bed;with call bell/phone within reach;with bed alarm set Nurse Communication: Mobility status PT Visit Diagnosis: Other abnormalities of gait and mobility (R26.89)     Time: [id number] PT Time Calculation (min) (ACUTE ONLY): 13 min  Charges:  $Therapeutic Activity: 8-22 mins $Neuromuscular Re-education: 8-22 mins                    [name], PT, DPT Acute Rehabilitation Services Pager [phone] Office [phone]    [name] 01/24/2019, 5:17 PM

## 2019-01-25 LAB — GLUCOSE, CAPILLARY
Glucose-Capillary: 170 mg/dL — ABNORMAL HIGH (ref 70–99)
Glucose-Capillary: 165 mg/dL — ABNORMAL HIGH (ref 70–99)
Glucose-Capillary: 97 mg/dL (ref 70–99)
Glucose-Capillary: 112 mg/dL — ABNORMAL HIGH (ref 70–99)

## 2019-01-25 MED ORDER — AMLODIPINE BESYLATE 10 MG PO TABS
10.0000 mg | ORAL_TABLET | Freq: Every day | ORAL | Status: DC
  Administered 2019-01-26 – 2019-02-02 (×8): 10 mg via ORAL
  Filled 2019-01-25 (×8): qty 1

## 2019-01-25 NOTE — Progress Notes (Signed)
PROGRESS NOTE    Becky Stout  [id number] DOB: [birth year] DOA: 11/03/2018 PCP: Abner Greenspan, MD   Brief Narrative:  Patient is a 64 year old Caucasian female with past medical history significant for hypertension, type 2 diabetes, WPW syndrome, confusion and frequent falls.  Patient was admitted with DKA, acute CVA and metabolic encephalopathy.  All the active issues have been resolved and patient is currently waiting for discharge to SNF.  01/22/2019: Patient seen.  Patient is eager to be discharged back home.  I have communicated to the patient and that we will have a discussion with the sister in the morning.  For now, the plan remains to pursue placement.  Apparently, patient lives with her 37 year old mother.  01/23/2019: Patient seen alongside patient's sister, Morey Hummingbird; patient's nurse and physical therapist.  Updated patient's sister.  Discussed extensively with the patient.  Explained need for optimization prior to eventually discharged back to an 42 year old mother to the patient.  Pursue disposition as planned.  01/24/2019: Patient seen today.  No new changes.  Pursue disposition.  01/25/2019: Patient continues to want to go home. I do not see any significant change from prior situation. Will continue to pursue placement.  Assessment & Plan:   Principal Problem:   DKA (diabetic ketoacidoses) ([place]) Active Problems:   Hypothyroidism   B12 deficiency   WOLFF (WOLFE)-PARKINSON-WHITE (WPW) SYNDROME   Essential hypertension   Hyperlipidemia   Poorly controlled type 2 diabetes mellitus (H[facility])   Anxiety   Failure to thrive in adult   Acute lower UTI   High anion gap metabolic acidosis   Cerebral thrombosis with cerebral infarction   Acute cystitis without hematuria  Diabetic ketoacidosis Resolved Hemoglobin A1c at 7.9. CBG (last 3)  Recent Labs (last 2 labs)        Recent Labs    01/23/19 2130 01/24/19 0617 01/24/19 1223  GLUCAP 118* 123* 166*    CBGs have  been well controlled.  Continue with Lantus and sliding scale insulin and NovoLog 4 units 3 times daily AC along with linagliptin 5 mg daily.[name]  No changes in the medications. Hypertension -Blood pressure is not at goal.   -Increase Norvasc to 10 mg once daily, Coreg 6.25 Mg p.o. twice daily and lisinopril 40 Mg p.o. once daily.   -Goal blood pressure is below 130/80 mmHg.  Hyperlipidemia -Continue statin.  Patient is currently on Lipitor 80 mg p.o. once daily.     History of hypokalemia and hypo-Magness anemia Check renal panel and magnesium in the morning. Last lab work was done on 01/12/2019.  History of acute stroke with left hemiparesis: MRI of the brain showed scattered small acute infarcts in both cerebral hemispheres and brainstem along with chronic basal ganglia infarction moderately advanced cerebral atrophy. Echocardiogram showed normal left ventricular ejection fraction with impaired left ventricular diastolic filling suggestive of diastolic heart failure.   Carotid duplex unremarkable Transcranial Dopplers were unremarkable. Completed 3 weeks of Plavix Continue with statin. Neurology signed OFF  History of anxiety, depression Continue with Zoloft.  GERD Stable   DVT prophylaxis: Lovenox Code Status: DNR Family Communication:  Disposition Plan: pending SNF vs. Home with [facility] if improvement continues  Consultants:   Neurology (signed off)  Procedures:   none  Antimicrobials:   none    Subjective: Doing well today, resting in bed.  Objective: Vitals:   01/24/19 1938 01/25/19 0015 01/25/19 0427 01/25/19 0751  BP: (!) 126/54 (!) 131/59 139/68 (!) 146/62  Pulse: 70 71 70 78  Resp: 18 18 18 16   Temp: 97.7 F (36.5 C) 98.7 F (37.1 C) 97.9 F (36.6 C) 98.2 F (36.8 C)  TempSrc: Oral Oral Oral Oral  SpO2: 100% 100% 97% 100%  Weight:      Height:        Intake/Output Summary (Last 24 hours) at 01/25/2019 1042 Last data filed at 01/25/2019 0750 Gross  per 24 hour  Intake 600 ml  Output --  Net 600 ml   Filed Weights   01/20/19 0139 01/21/19 0455 01/22/19 0128  Weight: 98 kg 98.6 kg 102 kg    Examination:  General exam: Appears calm and comfortable  Respiratory system: Clear to auscultation. Respiratory effort normal. Cardiovascular system: S1 & S2 heard, RRR. No JVD, murmurs, rubs, gallops or clicks. No pedal edema. Gastrointestinal system: Abdomen is nondistended, soft and nontender. Central nervous system: Alert and oriented. Extremities: Neg Homman's  Data Reviewed: I have personally reviewed following labs and imaging studies CBG: Recent Labs  Lab 01/24/19 0617 01/24/19 1223 01/24/19 1641 01/24/19 2054 01/25/19 0615  GLUCAP 123* 166* 81 152* 170*   Urine analysis:    Component Value Date/Time   COLORURINE YELLOW 11/03/2018 0341   APPEARANCEUR CLOUDY (A) 11/03/2018 0341   LABSPEC 1.024 11/03/2018 0341   PHURINE 6.0 11/03/2018 0341   GLUCOSEU >=500 (A) 11/03/2018 0341   HGBUR MODERATE (A) 11/03/2018 0341   HGBUR negative 11/01/2008 1121   BILIRUBINUR NEGATIVE 11/03/2018 0341   BILIRUBINUR neg. 12/21/2012 0933   KETONESUR 80 (A) 11/03/2018 0341   PROTEINUR 30 (A) 11/03/2018 0341   UROBILINOGEN 0.2 12/21/2012 0933   UROBILINOGEN 0.2 11/01/2008 1121   NITRITE NEGATIVE 11/03/2018 0341   LEUKOCYTESUR MODERATE (A) 11/03/2018 0341    Scheduled Meds: . amLODipine  5 mg Oral Daily  . atorvastatin  80 mg Oral q1800  . carvedilol  6.25 mg Oral BID WC  . clopidogrel  75 mg Oral Daily  . enoxaparin (LOVENOX) injection  40 mg Subcutaneous Q24H  . feeding supplement  1 Container Oral BID BM  . feeding supplement (PRO-STAT SUGAR FREE 64)  30 mL Oral TID  . insulin aspart  0-15 Units Subcutaneous TID WC  . insulin aspart  0-5 Units Subcutaneous QHS  . insulin aspart  4 Units Subcutaneous TID WC  . insulin glargine  18 Units Subcutaneous QHS  . linagliptin  5 mg Oral Daily  . lisinopril  40 mg Oral Daily  .  pantoprazole  40 mg Oral Daily  . sertraline  100 mg Oral Daily  . sodium chloride flush  10-40 mL Intracatheter Q12H  . thiamine  100 mg Oral Daily   Continuous Infusions:   LOS: 83 days   Time spent: 15  Donnamae Jude, MD [facility] Pager [phone]  If 7PM-7AM, please contact night-coverage [email or web address] Password [facility] 01/25/2019, 10:42 AM

## 2019-01-26 DIAGNOSIS — E1310 Other specified diabetes mellitus with ketoacidosis without coma: Secondary | ICD-10-CM

## 2019-01-26 DIAGNOSIS — N3000 Acute cystitis without hematuria: Secondary | ICD-10-CM

## 2019-01-26 LAB — GLUCOSE, CAPILLARY
Glucose-Capillary: 156 mg/dL — ABNORMAL HIGH (ref 70–99)
Glucose-Capillary: 185 mg/dL — ABNORMAL HIGH (ref 70–99)
Glucose-Capillary: 178 mg/dL — ABNORMAL HIGH (ref 70–99)
Glucose-Capillary: 95 mg/dL (ref 70–99)

## 2019-01-26 NOTE — Progress Notes (Addendum)
PROGRESS NOTE  Becky Stout [id number] DOB: 03-09-1955 DOA: 11/03/2018 PCP: Abner Greenspan, MD  HPI/Recap of past 60 hours: 64 year old female with past medical history of diabetes, hypertension, WPW syndrome, frequent falls confusion, acute CVA and metabolic encephalopathy..  Patient is waiting for nursing home placement.  Patient is said to not be participating in activities refusing to get up out of bed and just not wanting to do anything.  Subjective: Patient seen and examined at bedside.  Her sister Morey Hummingbird was at bedside.  Patient denies any complaint.  Her assisted stated that she works and unable to take care of patient and if patient is discharged home will be going to stay with her 62 year old mother who cannot take care of herself and for this patient to be discharged home she has to be able to take care of herself.  January 13, 2019. Subjective: Patient seen and examined at bedside he denies any new complaint she was lying in bed.  January 26, 2019, Subjective: Patient seen and examined at bedside with her sister Morey Hummingbird. Patient stated she wants to go home.  It seems to me that her Sister Morey Hummingbird does not want to say it in front of her that she cannot come home.  She agreed  that patient can go home. when I ask her if her 62 year old mother can take care of her , she said yes but then she said please look into the chart and make sure everything is right for her to go home.  Per physical therapy note patient is still needing  assistance.  She did not want to go further down to the bathroom during her last physical therapy session because she complained of being tired.  She is still also soiling herself and needs to be changed so per previous discussion she is not able to go home to her 62 year old mother until she is able to do for herself.  Assessment/Plan: Principal Problem:   DKA (diabetic ketoacidoses) (HCC) Active Problems:   Hypothyroidism   B12 deficiency   WOLFF  (WOLFE)-PARKINSON-WHITE (WPW) SYNDROME   Essential hypertension   Hyperlipidemia   Poorly controlled type 2 diabetes mellitus (HCC)   Anxiety   Failure to thrive in adult   Acute lower UTI   High anion gap metabolic acidosis   Cerebral thrombosis with cerebral infarction   Acute cystitis without hematuria #1 essential hypertension stable continue amlodipine Coreg lisinopril and hydralazine.  2.  Morbid obesity BMI 37.6 patient does not want to participate in activities like getting out of bed.  3.  GERD continue Protonix p.o. daily  4.  Anxiety depression patient has been evaluated by psychiatry continue Zoloft which was increased to 200 mg on December 11  5.  Acute CVA with left hemiparesis.  Neurology evaluated and signed off already patient has completed 3 weeks of dual antiplatelet therapy with Plavix continue aspirin 81 mg daily continue atorvastatin 80 mg at bedtime awaiting skilled nursing facility transfer  6.  DKA this was the presenting problem but this has resolved continue current blood pressure control with Lovenox and Lantus and Tradjenta  7. debility.  Patient still taking physical therapy, she still needs assistance.  Code Status: DNR  Severity of Illness: The appropriate patient status for this patient is INPATIENT. Inpatient status is judged to be reasonable and necessary in order to provide the required intensity of service to ensure the patient's safety. The patient's presenting symptoms, physical exam findings, and initial radiographic and laboratory data in  the context of their chronic comorbidities is felt to place them at high risk for further clinical deterioration. Furthermore, it is not anticipated that the patient will be medically stable for discharge from the hospital within 2 midnights of admission. The following factors support the patient status of inpatient.   Waiting for an nursing home placement patient is having difficulty being placed  * I certify  that at the point of admission it is my clinical judgment that the patient will require inpatient hospital care spanning beyond 2 midnights from the point of admission due to high intensity of service, high risk for further deterioration and high frequency of surveillance required.*    Family Communication: Spoke with Morey Hummingbird at the bedside her sister  Disposition Plan: Discussed discharge planning with her Aundria Mems at bedside.  Patient is waiting for SNF   Consultants:  Neurology  Psychiatry  Procedures:  None  Antimicrobials:  None  DVT prophylaxis: Lovenox   Objective: Vitals:   01/25/19 2347 01/26/19 0336 01/26/19 0725 01/26/19 1116  BP: (!) 143/63 (!) 145/69 139/63 (!) 122/58  Pulse: 69 71 72 64  Resp: 16 16 16 17   Temp: 97.7 F (36.5 C) 98 F (36.7 C) 98 F (36.7 C) 98.2 F (36.8 C)  TempSrc: Oral Oral Oral Oral  SpO2: 100% 96% 99% 98%  Weight:      Height:        Intake/Output Summary (Last 24 hours) at 01/26/2019 1241 Last data filed at 01/26/2019 1034 Gross per 24 hour  Intake 250 ml  Output --  Net 250 ml   Filed Weights   01/20/19 0139 01/21/19 0455 01/22/19 0128  Weight: 98 kg 98.6 kg 102 kg   Body mass index is 38.6 kg/m.  Exam:  . General: 64 y.o. year-old female well developed well nourished in no acute distress.  Alert and oriented x3.  Obese no distress . Cardiovascular: Regular rate and rhythm with no rubs or gallops.  No thyromegaly or JVD noted.   [name] Respiratory: Clear to auscultation with no wheezes or rales. Good inspiratory effort. . Abdomen: Soft nontender nondistended with normal bowel sounds x4 quadrants. . Musculoskeletal: No lower extremity edema. 2/4 pulses in all 4 extremities. . Skin: No ulcerative lesions noted or rashes, . Psychiatry: Mood is appropriate for condition and setting    Data Reviewed: CBC: No results for input(s): WBC, NEUTROABS, HGB, HCT, MCV, PLT in the last 168 hours. Basic Metabolic Panel: No  results for input(s): NA, K, CL, CO2, GLUCOSE, BUN, CREATININE, CALCIUM, MG, PHOS in the last 168 hours. GFR: Estimated Creatinine Clearance: 85.8 mL/min (by C-G formula based on SCr of 0.53 mg/dL). Liver Function Tests: No results for input(s): AST, ALT, ALKPHOS, BILITOT, PROT, ALBUMIN in the last 168 hours. No results for input(s): LIPASE, AMYLASE in the last 168 hours. No results for input(s): AMMONIA in the last 168 hours. Coagulation Profile: No results for input(s): INR, PROTIME in the last 168 hours. Cardiac Enzymes: No results for input(s): CKTOTAL, CKMB, CKMBINDEX, TROPONINI in the last 168 hours. BNP (last 3 results) No results for input(s): PROBNP in the last 8760 hours. HbA1C: No results for input(s): HGBA1C in the last 72 hours. CBG: Recent Labs  Lab 01/25/19 1144 01/25/19 1603 01/25/19 2102 01/26/19 0602 01/26/19 1118  GLUCAP 165* 97 112* 156* 185*   Lipid Profile: No results for input(s): CHOL, HDL, LDLCALC, TRIG, CHOLHDL, LDLDIRECT in the last 72 hours. Thyroid Function Tests: No results for input(s): TSH, T4TOTAL, FREET4,  T3FREE, THYROIDAB in the last 72 hours. Anemia Panel: No results for input(s): VITAMINB12, FOLATE, FERRITIN, TIBC, IRON, RETICCTPCT in the last 72 hours. Urine analysis:    Component Value Date/Time   COLORURINE YELLOW 11/03/2018 0341   APPEARANCEUR CLOUDY (A) 11/03/2018 0341   LABSPEC 1.024 11/03/2018 0341   PHURINE 6.0 11/03/2018 0341   GLUCOSEU >=500 (A) 11/03/2018 0341   HGBUR MODERATE (A) 11/03/2018 0341   HGBUR negative 11/01/2008 1121   BILIRUBINUR NEGATIVE 11/03/2018 0341   BILIRUBINUR neg. 12/21/2012 0933   KETONESUR 80 (A) 11/03/2018 0341   PROTEINUR 30 (A) 11/03/2018 0341   UROBILINOGEN 0.2 12/21/2012 0933   UROBILINOGEN 0.2 11/01/2008 1121   NITRITE NEGATIVE 11/03/2018 0341   LEUKOCYTESUR MODERATE (A) 11/03/2018 0341   Sepsis Labs: @LABRCNTIP (procalcitonin:4,lacticidven:4)  )No results found for this or any previous  visit (from the past 240 hour(s)).    Studies: No results found.  Scheduled Meds: . amLODipine  10 mg Oral Daily  . atorvastatin  80 mg Oral q1800  . carvedilol  6.25 mg Oral BID WC  . clopidogrel  75 mg Oral Daily  . enoxaparin (LOVENOX) injection  40 mg Subcutaneous Q24H  . feeding supplement  1 Container Oral BID BM  . feeding supplement (PRO-STAT SUGAR FREE 64)  30 mL Oral TID  . insulin aspart  0-15 Units Subcutaneous TID WC  . insulin aspart  0-5 Units Subcutaneous QHS  . insulin aspart  4 Units Subcutaneous TID WC  . insulin glargine  18 Units Subcutaneous QHS  . linagliptin  5 mg Oral Daily  . lisinopril  40 mg Oral Daily  . pantoprazole  40 mg Oral Daily  . sertraline  100 mg Oral Daily  . sodium chloride flush  10-40 mL Intracatheter Q12H  . thiamine  100 mg Oral Daily    Continuous Infusions:   LOS: 84 days     Cristal Deer, MD [facility]  To reach me or the doctor on call, go to: [email or web address] Password [facility]  01/26/2019, 12:41 PM

## 2019-01-27 LAB — GLUCOSE, CAPILLARY
Glucose-Capillary: 117 mg/dL — ABNORMAL HIGH (ref 70–99)
Glucose-Capillary: 194 mg/dL — ABNORMAL HIGH (ref 70–99)
Glucose-Capillary: 107 mg/dL — ABNORMAL HIGH (ref 70–99)
Glucose-Capillary: 121 mg/dL — ABNORMAL HIGH (ref 70–99)

## 2019-01-27 NOTE — Progress Notes (Signed)
Patient got up to bathroom by herself. Instructed to use nurse call light to get help to the bathroom. Patient cleaned from having  loose stool with CHG swipes. A new bad and briefs paced on patient. Assisted back to bed and bed alarm rest. Bed in lowest position and call light within reach.

## 2019-01-27 NOTE — Progress Notes (Signed)
PROGRESS NOTE  Becky Stout Becky Stout Becky Stout DOB: 20-Jan-1956 DOA: 11/03/2018 PCP: Abner Greenspan, MD  HPI/Recap of past 95 hours: 64 year old female with past medical history of diabetes, hypertension, WPW syndrome, frequent falls confusion, acute CVA and metabolic encephalopathy..  Patient is waiting for nursing home placement.  Patient is said to not be participating in activities refusing to get up out of bed and just not wanting to do anything.  Subjective: Patient seen and examined at bedside.  Her sister Morey Hummingbird was at bedside.  Patient denies any complaint.  Her assisted stated that she works and unable to take care of patient and if patient is discharged home will be going to stay with her 16 year old mother who cannot take care of herself and for this patient to be discharged home she has to be able to take care of herself.  January 13, 2019. Subjective: Patient seen and examined at bedside he denies any new complaint she was lying in bed.  January 26, 2019, Subjective: Patient seen and examined at bedside with her sister Morey Hummingbird. Patient stated she wants to go home.  It seems to me that her Sister Morey Hummingbird does not want to say it in front of her that she cannot come home.  She agreed  that patient can go home. when I ask her if her 73 year old mother can take care of her , she said yes but then she said please look into the chart and make sure everything is right for her to go home.  Per physical therapy note patient is still needing  assistance.  She did not want to go further down to the bathroom during her last physical therapy session because she complained of being tired.  She is still also soiling herself and needs to be changed so per previous discussion she is not able to go home to her 19 year old mother until she is able to do for herself.  January 27, 2019. Subjective: Patient seen and examined at bedside no new complaints.  However nurse noted that patient is still soiling  herself even though she was able to get up to the bathroom herself she needed help to clean up from soiled stool.  Assessment/Plan: Principal Problem:   DKA (diabetic ketoacidoses) (HCC) Active Problems:   Hypothyroidism   B12 deficiency   WOLFF (WOLFE)-PARKINSON-WHITE (WPW) SYNDROME   Essential hypertension   Hyperlipidemia   Poorly controlled type 2 diabetes mellitus (HCC)   Anxiety   Failure to thrive in adult   Acute lower UTI   High anion gap metabolic acidosis   Cerebral thrombosis with cerebral infarction   Acute cystitis without hematuria #1 essential hypertension stable continue amlodipine Coreg lisinopril and hydralazine.  2.  Morbid obesity BMI 37.6 patient does not want to participate in activities like getting out of bed.  3.  GERD continue Protonix p.o. daily  4.  Anxiety depression patient has been evaluated by psychiatry continue Zoloft which was increased to 200 mg on December 11  5.  Acute CVA with left hemiparesis.  Neurology evaluated and signed off already patient has completed 3 weeks of dual antiplatelet therapy with Plavix continue aspirin 81 mg daily continue atorvastatin 80 mg at bedtime awaiting skilled nursing facility transfer  6.  DKA this was the presenting problem but this has resolved continue current blood pressure control with Lovenox and Lantus and Tradjenta  7. debility.  Patient still taking physical therapy, she still needs assistance.  Code Status: DNR  Severity of Illness: The  appropriate patient status for this patient is INPATIENT. Inpatient status is judged to be reasonable and necessary in order to provide the required intensity of service to ensure the patient's safety. The patient's presenting symptoms, physical exam findings, and initial radiographic and laboratory data in the context of their chronic comorbidities is felt to place them at high risk for further clinical deterioration. Furthermore, it is not anticipated that the  patient will be medically stable for discharge from the hospital within 2 midnights of admission. The following factors support the patient status of inpatient.   Waiting for an nursing home placement patient is having difficulty being placed  * I certify that at the point of admission it is my clinical judgment that the patient will require inpatient hospital care spanning beyond 2 midnights from the point of admission due to high intensity of service, high risk for further deterioration and high frequency of surveillance required.*    Family Communication: Spoke with Morey Hummingbird at the bedside her sister  Disposition Plan: Discussed discharge planning with her Aundria Mems at bedside.  Patient is waiting for SNF   Consultants:  Neurology  Psychiatry  Procedures:  None  Antimicrobials:  None  DVT prophylaxis: Lovenox   Objective: Vitals:   01/27/19 0401 01/27/19 0752 01/27/19 1122 01/27/19 1517  BP: (!) 127/56 (!) 134/56 (!) 105/57 (!) 122/57  Pulse: 63 68 64 65  Resp: 16 16 16 20   Temp: 98.4 F (36.9 C) 98.2 F (36.8 C) 97.9 F (36.6 C) 98.3 F (36.8 C)  TempSrc: Oral Oral Oral Oral  SpO2: 100% 100% 99% 99%  Weight:      Height:        Intake/Output Summary (Last 24 hours) at 01/27/2019 1759 Last data filed at 01/27/2019 1021 Gross per 24 hour  Intake 10 ml  Output --  Net 10 ml   Filed Weights   01/21/19 0455 01/22/19 0128 01/27/19 0314  Weight: 98.6 kg 102 kg 98 kg   Body mass index is 37.09 kg/m.  Exam:  . General: 64 y.o. year-old female well developed well nourished in no acute distress.  Alert and oriented x3.  Obese no distress . Cardiovascular: Regular rate and rhythm with no rubs or gallops.  No thyromegaly or JVD noted.   [name] Respiratory: Clear to auscultation with no wheezes or rales. Good inspiratory effort. . Abdomen: Soft nontender nondistended with normal bowel sounds x4 quadrants. . Musculoskeletal: No lower extremity edema. 2/4 pulses in all  4 extremities. . Skin: No ulcerative lesions noted or rashes, . Psychiatry: Mood is appropriate for condition and setting    Data Reviewed: CBC: No results for input(s): WBC, NEUTROABS, HGB, HCT, MCV, PLT in the last 168 hours. Basic Metabolic Panel: No results for input(s): NA, K, CL, CO2, GLUCOSE, BUN, CREATININE, CALCIUM, MG, PHOS in the last 168 hours. GFR: Estimated Creatinine Clearance: 83.9 mL/min (by C-G formula based on SCr of 0.53 mg/dL). Liver Function Tests: No results for input(s): AST, ALT, ALKPHOS, BILITOT, PROT, ALBUMIN in the last 168 hours. No results for input(s): LIPASE, AMYLASE in the last 168 hours. No results for input(s): AMMONIA in the last 168 hours. Coagulation Profile: No results for input(s): INR, PROTIME in the last 168 hours. Cardiac Enzymes: No results for input(s): CKTOTAL, CKMB, CKMBINDEX, TROPONINI in the last 168 hours. BNP (last 3 results) No results for input(s): PROBNP in the last 8760 hours. HbA1C: No results for input(s): HGBA1C in the last 72 hours. CBG: Recent Labs  Lab  01/26/19 1700 01/26/19 2111 01/27/19 0618 01/27/19 1123 01/27/19 1635  GLUCAP 178* 95 117* 194* 107*   Lipid Profile: No results for input(s): CHOL, HDL, LDLCALC, TRIG, CHOLHDL, LDLDIRECT in the last 72 hours. Thyroid Function Tests: No results for input(s): TSH, T4TOTAL, FREET4, T3FREE, THYROIDAB in the last 72 hours. Anemia Panel: No results for input(s): VITAMINB12, FOLATE, FERRITIN, TIBC, IRON, RETICCTPCT in the last 72 hours. Urine analysis:    Component Value Date/Time   COLORURINE YELLOW 11/03/2018 0341   APPEARANCEUR CLOUDY (A) 11/03/2018 0341   LABSPEC 1.024 11/03/2018 0341   PHURINE 6.0 11/03/2018 0341   GLUCOSEU >=500 (A) 11/03/2018 0341   HGBUR MODERATE (A) 11/03/2018 0341   HGBUR negative 11/01/2008 1121   BILIRUBINUR NEGATIVE 11/03/2018 0341   BILIRUBINUR neg. 12/21/2012 0933   KETONESUR 80 (A) 11/03/2018 0341   PROTEINUR 30 (A) 11/03/2018  0341   UROBILINOGEN 0.2 12/21/2012 0933   UROBILINOGEN 0.2 11/01/2008 1121   NITRITE NEGATIVE 11/03/2018 0341   LEUKOCYTESUR MODERATE (A) 11/03/2018 0341   Sepsis Labs: @LABRCNTIP (procalcitonin:4,lacticidven:4)  )No results found for this or any previous visit (from the past 240 hour(s)).    Studies: No results found.  Scheduled Meds: . amLODipine  10 mg Oral Daily  . atorvastatin  80 mg Oral q1800  . carvedilol  6.25 mg Oral BID WC  . clopidogrel  75 mg Oral Daily  . enoxaparin (LOVENOX) injection  40 mg Subcutaneous Q24H  . feeding supplement  1 Container Oral BID BM  . feeding supplement (PRO-STAT SUGAR FREE 64)  30 mL Oral TID  . insulin aspart  0-15 Units Subcutaneous TID WC  . insulin aspart  0-5 Units Subcutaneous QHS  . insulin aspart  4 Units Subcutaneous TID WC  . insulin glargine  18 Units Subcutaneous QHS  . linagliptin  5 mg Oral Daily  . lisinopril  40 mg Oral Daily  . pantoprazole  40 mg Oral Daily  . sertraline  100 mg Oral Daily  . sodium chloride flush  10-40 mL Intracatheter Q12H  . thiamine  100 mg Oral Daily    Continuous Infusions:   LOS: 85 days     Cristal Deer, MD [facility]  To reach me or the doctor on call, go to: [email or web address] Password [facility]  01/27/2019, 5:59 PM

## 2019-01-28 LAB — GLUCOSE, CAPILLARY
Glucose-Capillary: 151 mg/dL — ABNORMAL HIGH (ref 70–99)
Glucose-Capillary: 134 mg/dL — ABNORMAL HIGH (ref 70–99)
Glucose-Capillary: 100 mg/dL — ABNORMAL HIGH (ref 70–99)
Glucose-Capillary: 99 mg/dL (ref 70–99)

## 2019-01-28 NOTE — TOC Progression Note (Signed)
Transition of Care [facility]) - Progression Note    Patient Details  Name: Becky Stout MRN: [id number] Date of Birth: 1955-09-30  Transition of Care [facility]) CM/SW [place], [place] Phone Number: 01/28/2019, 10:13 AM  Clinical Narrative:   Patient continues to have no bed offers for SNF due to no payor source. CSW to continue to follow.    Expected Discharge Plan: [place] Barriers to Discharge: Inadequate or no insurance  Expected Discharge Plan and Services Expected Discharge Plan: [place] In-house Referral: Clinical Social Work Discharge Planning Services: CM Consult Post Acute Care Choice: [place] arrangements for the past 2 months: Single Family Home                                       Social Determinants of Health (SDOH) Interventions    Readmission Risk Interventions No flowsheet data found.

## 2019-01-28 NOTE — Progress Notes (Signed)
PROGRESS NOTE  Becky Stout [id number] DOB: November 05, 1955 DOA: 11/03/2018 PCP: Abner Greenspan, MD   LOS: 55 days   Brief narrative: As per HPI, 64 year old female with past medical history of diabetes, hypertension, WPW syndrome, frequent falls confusion, acute CVA and metabolic encephalopathy.  Patient is waiting for nursing home placement.  Patient is said to not be participating in activities refusing to get up out of bed and just not wanting to do anything. Per physical therapy note patient is still needing  assistance.  She did not want to go further down to the bathroom during her last physical therapy session because she complained of being tired.  She is still also soiling herself and needs to be changed so per previous discussion she is not able to go home to her 80 year old mother until she is able to do for herself.  Assessment/Plan:  Principal Problem:   DKA (diabetic ketoacidoses) (HCC) Active Problems:   Hypothyroidism   B12 deficiency   WOLFF (WOLFE)-PARKINSON-WHITE (WPW) SYNDROME   Essential hypertension   Hyperlipidemia   Poorly controlled type 2 diabetes mellitus (HCC)   Anxiety   Failure to thrive in adult   Acute lower UTI   High anion gap metabolic acidosis   Cerebral thrombosis with cerebral infarction   Acute cystitis without hematuria  essential hypertension.  stable on amlodipine Coreg lisinopril and hydralazine.  Morbid obesity BMI 37.6 patient does not want to participate in activities like getting out of bed.  GERD continue Protonix p.o. daily  Anxiety, depression:  patient has been evaluated by psychiatry. continue Zoloft which was increased to 200 mg on December 11  Acute CVA with left hemiparesis.  Neurology evaluated and signed off, patient has completed 3 weeks of dual antiplatelet therapy. Continue aspirin 81 mg daily continue atorvastatin 80 mg at bedtime. Awaiting skilled nursing facility transfer  DKA this was the presenting problem  but this has resolved- continue current blood pressure control with Lovenox and Lantus and Tradjenta  debility.   still needs assistance.  Awaiting skilled nursing facility placement.  VTE Prophylaxis: Lovenox  Code Status: DNR  Family Communication: None today.  Disposition Plan: Awaiting skilled nursing facility placement   Consultants:  Neurology  Psychiatry   Procedures:  None  Antibiotics:  Anti-infectives (From admission, onward)   Start     Dose/Rate Route Frequency Ordered Stop   11/03/18 1930  cefTRIAXone (ROCEPHIN) 1 g in sodium chloride 0.9 % 100 mL IVPB  Status:  Discontinued     1 g 200 mL/hr over 30 Minutes Intravenous Every 24 hours 11/03/18 1724 11/06/18 1118   11/03/18 0530  cefTRIAXone (ROCEPHIN) 1 g in sodium chloride 0.9 % 100 mL IVPB     1 g 200 mL/hr over 30 Minutes Intravenous  Once 11/03/18 0525 11/03/18 0629     Subjective: Today, patient denies interval complaints.  Denies shortness of breath, cough, nausea or vomiting.  Objective: Vitals:   01/28/19 0500 01/28/19 0818  BP: 132/67 (!) 127/58  Pulse: 83 70  Resp: 17 18  Temp: 98.1 F (36.7 C) 98.1 F (36.7 C)  SpO2: 97% 95%    Intake/Output Summary (Last 24 hours) at 01/28/2019 1110 Last data filed at 01/27/2019 1810 Gross per 24 hour  Intake 240 ml  Output --  Net 240 ml   Filed Weights   01/22/19 0128 01/27/19 0314 01/28/19 0310  Weight: 102 kg 98 kg 98 kg   Body mass index is 37.09 kg/m.   Physical Exam:  GENERAL: Patient is alert awake and oriented. Not in obvious distress.  Morbidly obese HENT: No scleral pallor or icterus. Pupils equally reactive to light. Oral mucosa is moist NECK: is supple, no palpable thyroid enlargement. CHEST: Clear to auscultation. No crackles or wheezes. Non tender on palpation. Diminished breath sounds bilaterally. CVS: S1 and S2 heard, no murmur. Regular rate and rhythm. No pericardial rub. ABDOMEN: Soft, non-tender, bowel sounds are  present. EXTREMITIES: No edema. CNS: Cranial nerves are intact. No focal motor or sensory deficits. SKIN: warm and dry without rashes.  Data Review: I have personally reviewed the following laboratory data and studies,  CBC: No results for input(s): WBC, NEUTROABS, HGB, HCT, MCV, PLT in the last 168 hours. Basic Metabolic Panel: No results for input(s): NA, K, CL, CO2, GLUCOSE, BUN, CREATININE, CALCIUM, MG, PHOS in the last 168 hours. Liver Function Tests: No results for input(s): AST, ALT, ALKPHOS, BILITOT, PROT, ALBUMIN in the last 168 hours. No results for input(s): LIPASE, AMYLASE in the last 168 hours. No results for input(s): AMMONIA in the last 168 hours. Cardiac Enzymes: No results for input(s): CKTOTAL, CKMB, CKMBINDEX, TROPONINI in the last 168 hours. BNP (last 3 results) No results for input(s): BNP in the last 8760 hours.  ProBNP (last 3 results) No results for input(s): PROBNP in the last 8760 hours.  CBG: Recent Labs  Lab 01/27/19 0618 01/27/19 1123 01/27/19 1635 01/27/19 2058 01/28/19 0615  GLUCAP 117* 194* 107* 121* 151*   No results found for this or any previous visit (from the past 240 hour(s)).   Studies: No results found.  Scheduled Meds: . amLODipine  10 mg Oral Daily  . atorvastatin  80 mg Oral q1800  . carvedilol  6.25 mg Oral BID WC  . clopidogrel  75 mg Oral Daily  . enoxaparin (LOVENOX) injection  40 mg Subcutaneous Q24H  . feeding supplement  1 Container Oral BID BM  . feeding supplement (PRO-STAT SUGAR FREE 64)  30 mL Oral TID  . insulin aspart  0-15 Units Subcutaneous TID WC  . insulin aspart  0-5 Units Subcutaneous QHS  . insulin aspart  4 Units Subcutaneous TID WC  . insulin glargine  18 Units Subcutaneous QHS  . linagliptin  5 mg Oral Daily  . lisinopril  40 mg Oral Daily  . pantoprazole  40 mg Oral Daily  . sertraline  100 mg Oral Daily  . sodium chloride flush  10-40 mL Intracatheter Q12H  . thiamine  100 mg Oral Daily     Continuous Infusions:   Flora Lipps, MD  [facility] 01/28/2019

## 2019-01-28 NOTE — Progress Notes (Signed)
Physical Therapy Treatment Patient Details Name: Becky Stout MRN: [id number] DOB: 1955-10-13 Today's Date: 01/28/2019    History of Present Illness 64 y.o. female with Past medical history of B12 deficiency, type II DM not on therapy, HTN, IDA, obesity, WPW syndrome.Pt dx with acute CVA, MRI reveals multiple cerebral and brainstem lacunar infarcts, acute metabolic encepholopathy, Hypertensive emergency, DKA, agorophobia, N/V, hypokalemia, and FTT.      PT Comments    Pt requiring encouragement today for out of bed activity. Upon therapist arrival pt reports she was already up in the chair today. Therapist encouraged pt to get OOB and try using bathroom, pt agreeable and ambulated within the room this session with supervision. Pt able to maintain standing balance with single UE support on RW while performing pericare with supervision and cues. Pt continues to require cues throughout session for safety and awareness. Pt would benefit from SNF level follow up therapy to maximize functional independence with mobility.     Follow Up Recommendations  SNF     Equipment Recommendations  3in1 (PT);Rolling walker with 5" wheels    Recommendations for Other Services       Precautions / Restrictions Precautions Precautions: Fall Precaution Comments: anxious, very particular but receptive with redirection Restrictions Weight Bearing Restrictions: No    Mobility  Bed Mobility Overal bed mobility: Modified Independent Bed Mobility: Sit to Supine;Supine to Sit     Supine to sit: Modified independent (Device/Increase time) Sit to supine: Modified independent (Device/Increase time)      Transfers Overall transfer level: Needs assistance Equipment used: Rolling walker (2 wheeled) Transfers: Sit to/from Stand Sit to Stand: Min guard;Min assist         General transfer comment: Pt requiring up to min assist to rise from low bed height; min guard to stand from  toilet  Ambulation/Gait Ambulation/Gait assistance: Supervision Gait Distance (Feet): 10 Feet(+10 ft) Assistive device: Rolling walker (2 wheeled) Gait Pattern/deviations: Step-through pattern;Wide base of support Gait velocity: decreased   General Gait Details: Pt requiring supervision for safety, increased lateral sway, utilizing wide BOS   Stairs             Wheelchair Mobility    Modified Rankin (Stroke Patients Only)       Balance Overall balance assessment: Needs assistance Sitting-balance support: Feet supported Sitting balance-Leahy Scale: Good     Standing balance support: Single extremity supported;During functional activity Standing balance-Leahy Scale: Poor Standing balance comment: at least one UE supported during functional tasks, standing balance with supervision while performing pericare following toileting today                            Cognition Arousal/Alertness: Awake/alert Behavior During Therapy: WFL for tasks assessed/performed Overall Cognitive Status: Impaired/Different from baseline Area of Impairment: Attention;Safety/judgement;Awareness;Problem solving                   Current Attention Level: Sustained Memory: Decreased short-term memory Following Commands: Follows one step commands with increased time Safety/Judgement: Decreased awareness of deficits     General Comments: pt with decreased awareness of deficits. Encouragement for OOB activity      Exercises      General Comments        Pertinent Vitals/Pain Faces Pain Scale: No hurt    Home Living                      Prior Function  PT Goals (current goals can now be found in the care plan section) Progress towards PT goals: Progressing toward goals    Frequency    Min 3X/week      PT Plan Current plan remains appropriate    Co-evaluation              AM-PAC PT "6 Clicks" Mobility   Outcome Measure  Help  needed turning from your back to your side while in a flat bed without using bedrails?: None Help needed moving from lying on your back to sitting on the side of a flat bed without using bedrails?: None Help needed moving to and from a bed to a chair (including a wheelchair)?: None Help needed standing up from a chair using your arms (e.g., wheelchair or bedside chair)?: None Help needed to walk in hospital room?: A [place] Help needed climbing 3-5 steps with a railing? : A [place] 6 Click Score: 22    End of Session   Activity Tolerance: Patient tolerated treatment well Patient left: in bed;with call bell/phone within reach;with bed alarm set Nurse Communication: Mobility status PT Visit Diagnosis: Other abnormalities of gait and mobility (R26.89)     Time: [id number] PT Time Calculation (min) (ACUTE ONLY): 14 min  Charges:  $Therapeutic Activity: 8-22 mins                     Netta Corrigan, PT, DPT, [facility] [place] 01/28/2019, 4:46 PM

## 2019-01-29 LAB — GLUCOSE, CAPILLARY
Glucose-Capillary: 148 mg/dL — ABNORMAL HIGH (ref 70–99)
Glucose-Capillary: 204 mg/dL — ABNORMAL HIGH (ref 70–99)
Glucose-Capillary: 67 mg/dL — ABNORMAL LOW (ref 70–99)
Glucose-Capillary: 120 mg/dL — ABNORMAL HIGH (ref 70–99)
Glucose-Capillary: 104 mg/dL — ABNORMAL HIGH (ref 70–99)

## 2019-01-29 NOTE — Progress Notes (Signed)
PROGRESS NOTE  Becky Stout [id number] DOB: 1955/11/08 DOA: 11/03/2018 PCP: Abner Greenspan, MD   LOS: 87 days   Brief narrative: As per HPI, 64 year old female with past medical history of diabetes, hypertension, WPW syndrome, frequent falls confusion, acute CVA and metabolic encephalopathy.  Patient is waiting for nursing home placement.  Patient is said to not be participating in activities refusing to get up out of bed and just not wanting to do anything. Per physical therapy note patient is still needing  assistance.  She did not want to go further down to the bathroom during her last physical therapy session because she complained of being tired.  She is still also soiling herself and needs to be changed so per previous discussion she is not able to go home to her 73 year old mother until she is able to do for herself.  Assessment/Plan:  Principal Problem:   DKA (diabetic ketoacidoses) (HCC) Active Problems:   Hypothyroidism   B12 deficiency   WOLFF (WOLFE)-PARKINSON-WHITE (WPW) SYNDROME   Essential hypertension   Hyperlipidemia   Poorly controlled type 2 diabetes mellitus (HCC)   Anxiety   Failure to thrive in adult   Acute lower UTI   High anion gap metabolic acidosis   Cerebral thrombosis with cerebral infarction   Acute cystitis without hematuria  essential hypertension.  stabl,e on amlodipine Coreg lisinopril and hydralazine.  Morbid obesity with BMI 37.6 - Patient does not want to participate in activities like getting out of bed.  GERD: continue Protonix p.o. daily  Anxiety, depression:  patient has been evaluated by psychiatry. continue Zoloft which was increased to 200 mg on December 11.  Acute CVA with left hemiparesis.  Neurology evaluated and signed off, patient has completed 3 weeks of dual antiplatelet therapy. Continue aspirin 81 mg daily continue atorvastatin 80 mg at bedtime. Awaiting skilled nursing facility transfer  DKA.  Resolved.  Continue  Lantus and Tradjenta.  Patient refuses labs.  Debility.   still needs assistance.  Awaiting skilled nursing facility placement.  VTE Prophylaxis: Lovenox  Code Status: DNR  Family Communication: None today.  Disposition Plan: Awaiting skilled nursing facility placement.   Consultants:  Neurology  Psychiatry   Procedures:  None  Antibiotics:  Anti-infectives (From admission, onward)   Start     Dose/Rate Route Frequency Ordered Stop   11/03/18 1930  cefTRIAXone (ROCEPHIN) 1 g in sodium chloride 0.9 % 100 mL IVPB  Status:  Discontinued     1 g 200 mL/hr over 30 Minutes Intravenous Every 24 hours 11/03/18 1724 11/06/18 1118   11/03/18 0530  cefTRIAXone (ROCEPHIN) 1 g in sodium chloride 0.9 % 100 mL IVPB     1 g 200 mL/hr over 30 Minutes Intravenous  Once 11/03/18 0525 11/03/18 0629     Subjective: Today, patient denies interval complaints.  Denies shortness of breath, cough nausea vomiting or abdominal pain.  Patient has refused labs.   Objective: Vitals:   01/29/19 0428 01/29/19 0804  BP: (!) 129/57 (!) 123/53  Pulse: 72 78  Resp:  17  Temp: 98.3 F (36.8 C) 98.8 F (37.1 C)  SpO2: 97% 99%    Intake/Output Summary (Last 24 hours) at 01/29/2019 1055 Last data filed at 01/28/2019 1840 Gross per 24 hour  Intake 240 ml  Output --  Net 240 ml   Filed Weights   01/28/19 0310 01/28/19 1949 01/29/19 0425  Weight: 98 kg 102 kg 97 kg   Body mass index is 36.71 kg/m.  Physical Exam: GENERAL: Patient is alert awake and oriented. Not in obvious distress.  Morbidly obese HENT: No scleral pallor or icterus. Pupils equally reactive to light. Oral mucosa is moist NECK: is supple, no palpable thyroid enlargement. CHEST: Clear to auscultation. No crackles or wheezes. Non tender on palpation. Diminished breath sounds bilaterally. CVS: S1 and S2 heard, no murmur. Regular rate and rhythm. No pericardial rub. ABDOMEN: Soft, non-tender, bowel sounds are  present. EXTREMITIES: No edema. CNS: Cranial nerves are intact. No focal motor or sensory deficits. SKIN: warm and dry without rashes.  Data Review: I have personally reviewed the following laboratory data and studies,  CBC: No results for input(s): WBC, NEUTROABS, HGB, HCT, MCV, PLT in the last 168 hours. Basic Metabolic Panel: No results for input(s): NA, K, CL, CO2, GLUCOSE, BUN, CREATININE, CALCIUM, MG, PHOS in the last 168 hours. Liver Function Tests: No results for input(s): AST, ALT, ALKPHOS, BILITOT, PROT, ALBUMIN in the last 168 hours. No results for input(s): LIPASE, AMYLASE in the last 168 hours. No results for input(s): AMMONIA in the last 168 hours. Cardiac Enzymes: No results for input(s): CKTOTAL, CKMB, CKMBINDEX, TROPONINI in the last 168 hours. BNP (last 3 results) No results for input(s): BNP in the last 8760 hours.  ProBNP (last 3 results) No results for input(s): PROBNP in the last 8760 hours.  CBG: Recent Labs  Lab 01/28/19 0615 01/28/19 1110 01/28/19 1546 01/28/19 2127 01/29/19 0607  GLUCAP 151* 134* 100* 99 148*   No results found for this or any previous visit (from the past 240 hour(s)).   Studies: No results found.  Scheduled Meds: . amLODipine  10 mg Oral Daily  . atorvastatin  80 mg Oral q1800  . carvedilol  6.25 mg Oral BID WC  . clopidogrel  75 mg Oral Daily  . enoxaparin (LOVENOX) injection  40 mg Subcutaneous Q24H  . feeding supplement  1 Container Oral BID BM  . feeding supplement (PRO-STAT SUGAR FREE 64)  30 mL Oral TID  . insulin aspart  0-15 Units Subcutaneous TID WC  . insulin aspart  0-5 Units Subcutaneous QHS  . insulin aspart  4 Units Subcutaneous TID WC  . insulin glargine  18 Units Subcutaneous QHS  . linagliptin  5 mg Oral Daily  . lisinopril  40 mg Oral Daily  . pantoprazole  40 mg Oral Daily  . sertraline  100 mg Oral Daily  . sodium chloride flush  10-40 mL Intracatheter Q12H  . thiamine  100 mg Oral Daily     Continuous Infusions:   Flora Lipps, MD  [facility] 01/29/2019

## 2019-01-29 NOTE — Progress Notes (Signed)
Pt refused morning labs.Dr.Denny informed.

## 2019-01-29 NOTE — Progress Notes (Signed)
Pt refused labs. MD Pokhrel has been notified.

## 2019-01-29 NOTE — Plan of Care (Signed)
  Problem: Education: Goal: Knowledge of General Education information will improve Description: Including pain rating scale, medication(s)/side effects and non-pharmacologic comfort measures Outcome: Progressing   Problem: Health Behavior/Discharge Planning: Goal: Ability to manage health-related needs will improve Outcome: Progressing   Problem: Clinical Measurements: Goal: Ability to maintain clinical measurements within normal limits will improve Outcome: Progressing Goal: Will remain free from infection Outcome: Progressing Goal: Diagnostic test results will improve Outcome: Progressing Goal: Respiratory complications will improve Outcome: Progressing Goal: Cardiovascular complication will be avoided Outcome: Progressing   Problem: Activity: Goal: Risk for activity intolerance will decrease Outcome: Progressing   Problem: Nutrition: Goal: Adequate nutrition will be maintained Outcome: Progressing   Problem: Coping: Goal: Level of anxiety will decrease Outcome: Progressing   Problem: Elimination: Goal: Will not experience complications related to bowel motility Outcome: Progressing Goal: Will not experience complications related to urinary retention Outcome: Progressing   Problem: Pain Managment: Goal: General experience of comfort will improve Outcome: Progressing   Problem: Safety: Goal: Ability to remain free from injury will improve Outcome: Progressing   Problem: Skin Integrity: Goal: Risk for impaired skin integrity will decrease Outcome: Progressing   Problem: Education: Goal: Knowledge of secondary prevention will improve Outcome: Progressing Goal: Knowledge of patient specific risk factors addressed and post discharge goals established will improve Outcome: Progressing Goal: Individualized Educational Video(s) Outcome: Progressing   Problem: Coping: Goal: Will verbalize positive feelings about self Outcome: Progressing Goal: Will identify  appropriate support needs Outcome: Progressing   Problem: Self-Care: Goal: Ability to participate in self-care as condition permits will improve Outcome: Progressing Goal: Verbalization of feelings and concerns over difficulty with self-care will improve Outcome: Progressing   Problem: Nutrition: Goal: Dietary intake will improve Outcome: Progressing   Problem: Ischemic Stroke/TIA Tissue Perfusion: Goal: Complications of ischemic stroke/TIA will be minimized Outcome: Progressing   Liana Crocker

## 2019-01-30 LAB — GLUCOSE, CAPILLARY
Glucose-Capillary: 133 mg/dL — ABNORMAL HIGH (ref 70–99)
Glucose-Capillary: 211 mg/dL — ABNORMAL HIGH (ref 70–99)
Glucose-Capillary: 60 mg/dL — ABNORMAL LOW (ref 70–99)
Glucose-Capillary: 117 mg/dL — ABNORMAL HIGH (ref 70–99)
Glucose-Capillary: 149 mg/dL — ABNORMAL HIGH (ref 70–99)

## 2019-01-30 NOTE — Plan of Care (Signed)
  Problem: Activity: Goal: Risk for activity intolerance will decrease Outcome: Progressing   

## 2019-01-30 NOTE — Progress Notes (Signed)
Physical Therapy Treatment Patient Details Name: Becky Stout MRN: [id number] DOB: Jan 07, 1956 Today's Date: 01/30/2019    History of Present Illness 64 y.o. female with Past medical history of B12 deficiency, type II DM not on therapy, HTN, IDA, obesity, WPW syndrome.Pt dx with acute CVA, MRI reveals multiple cerebral and brainstem lacunar infarcts, acute metabolic encepholopathy, Hypertensive emergency, DKA, agorophobia, N/V, hypokalemia, and FTT.      PT Comments    Pt performed gt training and functional mobility without resistance to participation.  She is performing, bed mobility, transfer and gt training with modified independence with use of RW.  Plan next session for stair training.  She remains limited in hygiene and requires step by step cues but is starting to complete more per staff.  Plan next session for stair training to prepare for return home if hygiene practices improve.  Pt is very motivated to return home and asks each session when she will be able to return home.  From a PT standpoint will update recommendation if stair training goes well.      Follow Up Recommendations  SNF     Equipment Recommendations  3in1 (PT);Rolling walker with 5" wheels    Recommendations for Other Services       Precautions / Restrictions Precautions Precautions: Fall Restrictions Weight Bearing Restrictions: No    Mobility  Bed Mobility Overal bed mobility: Modified Independent   Rolling: Modified independent (Device/Increase time)   Supine to sit: Modified independent (Device/Increase time)     General bed mobility comments: No assistance needed.  Transfers Overall transfer level: Modified independent Equipment used: Rolling walker (2 wheeled) Transfers: Sit to/from Stand Sit to Stand: Modified independent (Device/Increase time)         General transfer comment: No assistance needed from edge of bed or from toilet.  Ambulation/Gait Ambulation/Gait assistance:  Modified independent (Device/Increase time) Gait Distance (Feet): 150 Feet Assistive device: Rolling walker (2 wheeled) Gait Pattern/deviations: Step-through pattern;Wide base of support     General Gait Details: Pt remains with wide BOS but functioning with in normal limits and no assistance needed.  She was able to turn and back to seated surface without difficulty.   Stairs             Wheelchair Mobility    Modified Rankin (Stroke Patients Only)       Balance Overall balance assessment: Needs assistance Sitting-balance support: Feet supported Sitting balance-Leahy Scale: Normal       Standing balance-Leahy Scale: Good                              Cognition Arousal/Alertness: Awake/alert Behavior During Therapy: WFL for tasks assessed/performed Overall Cognitive Status: Impaired/Different from baseline                                 General Comments: Pt following all commands today and very receptive to participating.  She was dry on arrival and toileted during session.      Exercises      General Comments        Pertinent Vitals/Pain Pain Assessment: No/denies pain    Home Living                      Prior Function            PT Goals (current goals can now be  found in the care plan section) Acute Rehab PT Goals Patient Stated Goal: to go home Potential to Achieve Goals: Good Progress towards PT goals: Progressing toward goals    Frequency    Min 3X/week      PT Plan Current plan remains appropriate    Co-evaluation              AM-PAC PT "6 Clicks" Mobility   Outcome Measure  Help needed turning from your back to your side while in a flat bed without using bedrails?: None Help needed moving from lying on your back to sitting on the side of a flat bed without using bedrails?: None Help needed moving to and from a bed to a chair (including a wheelchair)?: None Help needed standing up from a  chair using your arms (e.g., wheelchair or bedside chair)?: None Help needed to walk in hospital room?: None Help needed climbing 3-5 steps with a railing? : A [employer] 6 Click Score: 23    End of Session Equipment Utilized During Treatment: Gait belt Activity Tolerance: Patient tolerated treatment well Patient left: in bed;with call bell/phone within reach;with bed alarm set Nurse Communication: Mobility status PT Visit Diagnosis: Other abnormalities of gait and mobility (R26.89)     Time: [id number] PT Time Calculation (min) (ACUTE ONLY): 17 min  Charges:  $Gait Training: 8-22 mins                     Erasmo Leventhal , PTA Acute Rehabilitation Services Pager [phone] Office [phone]     Yarden Hillis [name] 01/30/2019, 12:57 PM

## 2019-01-30 NOTE — Progress Notes (Signed)
PROGRESS NOTE  Becky Stout [id number] DOB: 08/13/1955 DOA: 11/03/2018 PCP: Abner Greenspan, MD   LOS: 88 days   Brief narrative: As per HPI, 64 year old female with past medical history of diabetes, hypertension, WPW syndrome, frequent falls confusion, acute CVA and metabolic encephalopathy.  Patient is waiting for nursing home placement.  Patient is said to not be participating in activities refusing to get up out of bed and just not wanting to do anything. Per physical therapy note patient is still needing  assistance.  She did not want to go further down to the bathroom during her last physical therapy session because she complained of being tired.  She is still also soiling herself and needs to be changed so per previous discussion she is not able to go home to her 70 year old mother until she is able to do for herself.  Assessment/Plan:  Principal Problem:   DKA (diabetic ketoacidoses) (HCC) Active Problems:   Hypothyroidism   B12 deficiency   WOLFF (WOLFE)-PARKINSON-WHITE (WPW) SYNDROME   Essential hypertension   Hyperlipidemia   Poorly controlled type 2 diabetes mellitus (HCC)   Anxiety   Failure to thrive in adult   Acute lower UTI   High anion gap metabolic acidosis   Cerebral thrombosis with cerebral infarction   Acute cystitis without hematuria  Essential hypertension.  stable on amlodipine, Coreg lisinopril and hydralazine.  Morbid obesity with BMI 37.6 - Patient does not want to participate in activities.  Seen by physical therapy 2 days back and still recommending skilled nursing facility.  GERD: continue Protonix p.o. daily  Anxiety, depression:  patient has been evaluated by psychiatry. continue Zoloft dose was increased to 200 mg on December 11.  Acute CVA with left hemiparesis.  Neurology is on board.  Patient has completed 3 weeks of dual antiplatelet therapy. Continue aspirin 81 mg, atorvastatin 80 mg at bedtime. Awaiting skilled nursing facility  placement.  DKA.  Resolved.  Continue Lantus and Tradjenta.  Patient refuses labs.  Debility.   still needs assistance.  Awaiting skilled nursing facility placement.  Seen by physical therapy.  VTE Prophylaxis: Lovenox  Code Status: DNR  Family Communication: None   Disposition Plan: Awaiting skilled nursing facility placement.  Difficult disposition due to lack of payer source and patient needing skilled nursing facility level of care.  Social worker is on board.   Consultants:  Neurology  Psychiatry   Procedures:  None  Antibiotics:  Anti-infectives (From admission, onward)   Start     Dose/Rate Route Frequency Ordered Stop   11/03/18 1930  cefTRIAXone (ROCEPHIN) 1 g in sodium chloride 0.9 % 100 mL IVPB  Status:  Discontinued     1 g 200 mL/hr over 30 Minutes Intravenous Every 24 hours 11/03/18 1724 11/06/18 1118   11/03/18 0530  cefTRIAXone (ROCEPHIN) 1 g in sodium chloride 0.9 % 100 mL IVPB     1 g 200 mL/hr over 30 Minutes Intravenous  Once 11/03/18 0525 11/03/18 0629     Subjective: Today, patient denies interval complaints.  Denies shortness of breath cough fever nausea or vomiting.  Objective: Vitals:   01/30/19 0342 01/30/19 0805  BP: 137/61 (!) 120/56  Pulse: 73 68  Resp: 16 16  Temp: 98.8 F (37.1 C) 98 F (36.7 C)  SpO2: 96% 98%   No intake or output data in the 24 hours ending 01/30/19 0949 Filed Weights   01/28/19 1949 01/29/19 0425 01/30/19 0344  Weight: 102 kg 97 kg 97 kg  Body mass index is 36.71 kg/m.   Physical Exam: GENERAL: Patient is alert awake and oriented. Not in obvious distress.  Morbidly obese HENT: No scleral pallor or icterus. Pupils equally reactive to light. Oral mucosa is moist NECK: is supple, no palpable thyroid enlargement. CHEST: Clear to auscultation. No crackles or wheezes. Non tender on palpation. Diminished breath sounds bilaterally. CVS: S1 and S2 heard, no murmur. Regular rate and rhythm. No pericardial  rub. ABDOMEN: Soft, non-tender, bowel sounds are present. EXTREMITIES: No edema. CNS: Cranial nerves are intact. No focal motor or sensory deficits. SKIN: warm and dry without rashes.  Data Review: I have personally reviewed the following laboratory data and studies,  CBC: No results for input(s): WBC, NEUTROABS, HGB, HCT, MCV, PLT in the last 168 hours. Basic Metabolic Panel: No results for input(s): NA, K, CL, CO2, GLUCOSE, BUN, CREATININE, CALCIUM, MG, PHOS in the last 168 hours. Liver Function Tests: No results for input(s): AST, ALT, ALKPHOS, BILITOT, PROT, ALBUMIN in the last 168 hours. No results for input(s): LIPASE, AMYLASE in the last 168 hours. No results for input(s): AMMONIA in the last 168 hours. Cardiac Enzymes: No results for input(s): CKTOTAL, CKMB, CKMBINDEX, TROPONINI in the last 168 hours. BNP (last 3 results) No results for input(s): BNP in the last 8760 hours.  ProBNP (last 3 results) No results for input(s): PROBNP in the last 8760 hours.  CBG: Recent Labs  Lab 01/29/19 1142 01/29/19 1548 01/29/19 1637 01/29/19 2131 01/30/19 0631  GLUCAP 204* 67* 120* 104* 133*   No results found for this or any previous visit (from the past 240 hour(s)).   Studies: No results found.  Scheduled Meds: . amLODipine  10 mg Oral Daily  . atorvastatin  80 mg Oral q1800  . carvedilol  6.25 mg Oral BID WC  . clopidogrel  75 mg Oral Daily  . enoxaparin (LOVENOX) injection  40 mg Subcutaneous Q24H  . feeding supplement  1 Container Oral BID BM  . feeding supplement (PRO-STAT SUGAR FREE 64)  30 mL Oral TID  . insulin aspart  0-15 Units Subcutaneous TID WC  . insulin aspart  0-5 Units Subcutaneous QHS  . insulin aspart  4 Units Subcutaneous TID WC  . insulin glargine  18 Units Subcutaneous QHS  . linagliptin  5 mg Oral Daily  . lisinopril  40 mg Oral Daily  . pantoprazole  40 mg Oral Daily  . sertraline  100 mg Oral Daily  . sodium chloride flush  10-40 mL  Intracatheter Q12H  . thiamine  100 mg Oral Daily    Continuous Infusions:   Flora Lipps, MD  [facility] 01/30/2019

## 2019-01-30 NOTE — Plan of Care (Signed)
  Problem: Education: Goal: Knowledge of General Education information will improve Description: Including pain rating scale, medication(s)/side effects and non-pharmacologic comfort measures Outcome: Progressing   Problem: Health Behavior/Discharge Planning: Goal: Ability to manage health-related needs will improve Outcome: Progressing   Problem: Clinical Measurements: Goal: Ability to maintain clinical measurements within normal limits will improve Outcome: Progressing Goal: Will remain free from infection Outcome: Progressing Goal: Diagnostic test results will improve Outcome: Progressing Goal: Respiratory complications will improve Outcome: Progressing Goal: Cardiovascular complication will be avoided Outcome: Progressing   Problem: Activity: Goal: Risk for activity intolerance will decrease Outcome: Progressing   Problem: Nutrition: Goal: Adequate nutrition will be maintained Outcome: Progressing   Problem: Coping: Goal: Level of anxiety will decrease Outcome: Progressing   Problem: Elimination: Goal: Will not experience complications related to bowel motility Outcome: Progressing Goal: Will not experience complications related to urinary retention Outcome: Progressing   Problem: Pain Managment: Goal: General experience of comfort will improve Outcome: Progressing   Problem: Safety: Goal: Ability to remain free from injury will improve Outcome: Progressing   Problem: Skin Integrity: Goal: Risk for impaired skin integrity will decrease Outcome: Progressing   Problem: Education: Goal: Knowledge of secondary prevention will improve Outcome: Progressing Goal: Knowledge of patient specific risk factors addressed and post discharge goals established will improve Outcome: Progressing Goal: Individualized Educational Video(s) Outcome: Progressing   Problem: Coping: Goal: Will verbalize positive feelings about self Outcome: Progressing Goal: Will identify  appropriate support needs Outcome: Progressing   Problem: Self-Care: Goal: Ability to participate in self-care as condition permits will improve Outcome: Progressing Goal: Verbalization of feelings and concerns over difficulty with self-care will improve Outcome: Progressing   Problem: Nutrition: Goal: Dietary intake will improve Outcome: Progressing   Problem: Ischemic Stroke/TIA Tissue Perfusion: Goal: Complications of ischemic stroke/TIA will be minimized Outcome: Progressing  Ival Bible, BSN, RN

## 2019-01-31 LAB — GLUCOSE, CAPILLARY
Glucose-Capillary: 134 mg/dL — ABNORMAL HIGH (ref 70–99)
Glucose-Capillary: 141 mg/dL — ABNORMAL HIGH (ref 70–99)
Glucose-Capillary: 91 mg/dL (ref 70–99)
Glucose-Capillary: 149 mg/dL — ABNORMAL HIGH (ref 70–99)

## 2019-01-31 MED ORDER — METFORMIN HCL 850 MG PO TABS
850.0000 mg | ORAL_TABLET | Freq: Two times a day (BID) | ORAL | Status: DC
  Administered 2019-01-31 – 2019-02-02 (×4): 850 mg via ORAL
  Filled 2019-01-31 (×5): qty 1

## 2019-01-31 MED ORDER — GLIPIZIDE ER 5 MG PO TB24: 5.0000 mg | ORAL_TABLET | Freq: Every day | ORAL | Status: DC

## 2019-01-31 NOTE — Progress Notes (Signed)
Inpatient Diabetes Program Recommendations  AACE/ADA: New Consensus Statement on Inpatient Glycemic Control (2015)  Target Ranges:  Prepandial:   less than 140 mg/dL      Peak postprandial:   less than 180 mg/dL (1-2 hours)      Critically ill patients:  140 - 180 mg/dL   Lab Results  Component Value Date   GLUCAP 141 (H) 01/31/2019   HGBA1C 7.9 (H) 11/03/2018    Review of Glycemic Control  Received page from [place], [place], regarding pt being discharged on diabetes meds. Ideally pt needs to go home on insulin, but not an option with pt being unwilling to monitor blood sugars and inject insulin. Had seen Dr. Cruzita Lederer, Endocrinologist, back in 2018, and was started on metformin ER 500 mg with dinner x 4 days and if tolerated, increase to 500 mg bid. Was given meter to check blood sugars. Did not return for f/u visit.   Inpatient Diabetes Program Recommendations:     Tradjenta 5 mg QD Metformin ER 500 mg bid  Will need f/u with PCP for diabetes management.  Thank you. Lorenda Peck, RD, LDN, CDE Inpatient Diabetes Coordinator [phone]

## 2019-01-31 NOTE — Progress Notes (Signed)
Physical Therapy Treatment Patient Details Name: Becky Stout MRN: [id number] DOB: 1955/05/21 Today's Date: 01/31/2019    History of Present Illness 64 y.o. female with Past medical history of B12 deficiency, type II DM not on therapy, HTN, IDA, obesity, WPW syndrome.Pt dx with acute CVA, MRI reveals multiple cerebral and brainstem lacunar infarcts, acute metabolic encepholopathy, Hypertensive emergency, DKA, agorophobia, N/V, hypokalemia, and FTT.      PT Comments    Pt performed gt training with focus of session on progression to stair training.  She required min guard for stair training to close supervision.  She continues to perform transfers, bed mobility and gt with MOD independence with use of RW.  Based on her functional gains and improvement with bowel and bladder incontinence she is ready to return home with HHPT follow up, RW and 3:1 commode chair.  She would benefit from an aide to come by weekly to assist in showering.  Will inform supervising PT of functional gains and need for updated recommendations.  Plan next session to retrial stair training and perform LE strengthening to improve stair training.     Follow Up Recommendations  Home health PT;Supervision - Intermittent     Equipment Recommendations  3in1 (PT);Rolling walker with 5" wheels    Recommendations for Other Services       Precautions / Restrictions Precautions Precautions: Fall Precaution Comments: very particular but receptive with redirection Restrictions Weight Bearing Restrictions: No    Mobility  Bed Mobility Overal bed mobility: Modified Independent Bed Mobility: Supine to Sit;Sit to Supine Rolling: Modified independent (Device/Increase time)   Supine to sit: Modified independent (Device/Increase time)     General bed mobility comments: No assistance needed.  Transfers Overall transfer level: Modified independent Equipment used: Rolling walker (2 wheeled) Transfers: Sit to/from Stand Sit  to Stand: Modified independent (Device/Increase time)         General transfer comment: Pt performed from low seated edge of bed without assistance.  Ambulation/Gait Ambulation/Gait assistance: Modified independent (Device/Increase time) Gait Distance (Feet): 200 Feet Assistive device: Rolling walker (2 wheeled) Gait Pattern/deviations: Step-through pattern;Wide base of support Gait velocity: decreased   General Gait Details: Pt remains with wide BOS but functioning with in normal limits and no assistance needed.  She was able to turn and back to seated surface without difficulty.   Stairs Stairs: Yes Stairs assistance: Min guard Stair Management: One rail Left Number of Stairs: 4 General stair comments: Pt performed forward to descend and backwards/sidways to descend.  She was guarded with flexed stance.   Wheelchair Mobility    Modified Rankin (Stroke Patients Only) Modified Rankin (Stroke Patients Only) Pre-Morbid Rankin Score: Moderate disability Modified Rankin: Moderately severe disability     Balance Overall balance assessment: Needs assistance Sitting-balance support: Feet supported Sitting balance-Leahy Scale: Normal     Standing balance support: Single extremity supported;During functional activity Standing balance-Leahy Scale: Good                              Cognition Arousal/Alertness: Awake/alert Behavior During Therapy: WFL for tasks assessed/performed Overall Cognitive Status: Within Functional Limits for tasks assessed(for tasks assessed.) Area of Impairment: Attention;Safety/judgement;Awareness;Problem solving                     Memory: Decreased short-term memory Following Commands: Follows one step commands with increased time       General Comments: Pt continues to follow commands well,  no redirection needed.  Performed all functional mobility without hesitation.      Exercises      General Comments         Pertinent Vitals/Pain Pain Assessment: No/denies pain Pain Score: 0-No pain Pain Intervention(s): Monitored during session    Home Living                      Prior Function            PT Goals (current goals can now be found in the care plan section) Acute Rehab PT Goals Patient Stated Goal: to go home Potential to Achieve Goals: Good Progress towards PT goals: Progressing toward goals    Frequency    Min 3X/week      PT Plan Discharge plan needs to be updated    Co-evaluation              AM-PAC PT "6 Clicks" Mobility   Outcome Measure  Help needed turning from your back to your side while in a flat bed without using bedrails?: None Help needed moving from lying on your back to sitting on the side of a flat bed without using bedrails?: None Help needed moving to and from a bed to a chair (including a wheelchair)?: None Help needed standing up from a chair using your arms (e.g., wheelchair or bedside chair)?: None Help needed to walk in hospital room?: None Help needed climbing 3-5 steps with a railing? : A Schadt 6 Click Score: 23    End of Session Equipment Utilized During Treatment: Gait belt Activity Tolerance: Patient tolerated treatment well Patient left: in bed;with call bell/phone within reach;with bed alarm set Nurse Communication: Mobility status PT Visit Diagnosis: Other abnormalities of gait and mobility (R26.89)     Time: 1209-1220 PT Time Calculation (min) (ACUTE ONLY): 11 min  Charges:  $Gait Training: 8-22 mins                     Erasmo Leventhal , PTA Acute Rehabilitation Services Pager [phone] Office [phone]     Becky Stout [name] 01/31/2019, 2:03 PM

## 2019-01-31 NOTE — Progress Notes (Signed)
PT has changed recommendations to [facility]. CM has reached out to patients sisters. They are in agreement with her discharging to Tammy's moms home. They have concerns about her managing insulin at home and patients mother is not willing to give her shots. CM reached out to MD and DM. Plan is to try her on oral meds and see how she does over next day or so and then d/c home with family on Saturday if she does well on oral diabetes meds.  Pt will need medication assistance at d/c. TOC following.

## 2019-01-31 NOTE — Plan of Care (Signed)
  Problem: Activity: Goal: Risk for activity intolerance will decrease Outcome: Progressing   

## 2019-01-31 NOTE — Progress Notes (Addendum)
PROGRESS NOTE  Becky Stout [id number] DOB: 1955-05-10 DOA: 11/03/2018 PCP: Abner Greenspan, MD   LOS: 15 days   Brief narrative: As per HPI, 64 year old female with past medical history of diabetes, hypertension, WPW syndrome, frequent falls confusion, acute CVA and metabolic encephalopathy.  Patient is waiting for nursing home placement.  Patient is said to not be participating in activities refusing to get up out of bed and just not wanting to do anything. Per physical therapy note patient is still needing assistance.  She did not want to go further down to the bathroom during her last physical therapy session because she complained of being tired.  She is still also soiling herself and needs to be changed so per previous discussion she is not able to go home to her 87 year old mother until she is able to do for herself.  Assessment/Plan:  Principal Problem:   DKA (diabetic ketoacidoses) (HCC) Active Problems:   Hypothyroidism   B12 deficiency   WOLFF (WOLFE)-PARKINSON-WHITE (WPW) SYNDROME   Essential hypertension   Hyperlipidemia   Poorly controlled type 2 diabetes mellitus (HCC)   Anxiety   Failure to thrive in adult   Acute lower UTI   High anion gap metabolic acidosis   Cerebral thrombosis with cerebral infarction   Acute cystitis without hematuria  Essential hypertension.  BP stable on amlodipine, Coreg, lisinopril and hydralazine. Monitor.  Morbid obesity with BMI 37.6 -PT on board. Recommending skilled nursing facility. Life style modification difficult due to lack of participation.  GERD: on Protonix  Anxiety, depression: seen by psychiatry. continue Zoloft at the current dose  Acute CVA with left hemiparesis.  Status post 3 weeks of dual antiplatelet therapy.  Continue aspirin Lipitor.  Awaiting skilled nursing facility placement.  DKA.  Resolved.  Continue Lantus and Tradjenta.  Patient has refused BMP.  Last A1c of 7.9 on 10/ 2020.  Debility.   Seen  by physical therapy 01/30/2019.  Still recommended skilled nursing facility.    VTE Prophylaxis: Lovenox  Code Status: DNR  Family Communication: None   Disposition Plan: Awaiting skilled nursing facility placement which is likely to be difficult due to lack of payer source.  Spoke with the social worker about it today.  Patient lives with her elderly mother in the 37s and physical therapy recommending skilled nursing facility therapy.   Will need continued PT evaluation while in the hospital to see if she would be more independent.  There is a possibility that she might be able to be discharged home in next few days if she continues to progress and demonstrate that she is independent to live by herself..   Consultants:  Neurology  Psychiatry  Procedures:  None  Antibiotics:  Anti-infectives (From admission, onward)   Start     Dose/Rate Route Frequency Ordered Stop   11/03/18 1930  cefTRIAXone (ROCEPHIN) 1 g in sodium chloride 0.9 % 100 mL IVPB  Status:  Discontinued     1 g 200 mL/hr over 30 Minutes Intravenous Every 24 hours 11/03/18 1724 11/06/18 1118   11/03/18 0530  cefTRIAXone (ROCEPHIN) 1 g in sodium chloride 0.9 % 100 mL IVPB     1 g 200 mL/hr over 30 Minutes Intravenous  Once 11/03/18 0525 11/03/18 0629     Subjective: Today, denies interval complaints.  Denies any chest pain, shortness of breath or fever.  Objective: Vitals:   01/30/19 2358 01/31/19 0403  BP: (!) 132/53 (!) 128/54  Pulse: 72 70  Resp: 17 18  Temp: 98.6 F (37 C) 98.5 F (36.9 C)  SpO2: 98% 97%    Intake/Output Summary (Last 24 hours) at 01/31/2019 0731 Last data filed at 01/30/2019 1200 Gross per 24 hour  Intake 360 ml  Output --  Net 360 ml   Filed Weights   01/29/19 0425 01/30/19 0344 01/31/19 0403  Weight: 97 kg 97 kg 100 kg   Body mass index is 37.84 kg/m.   Physical Exam: General: Awake and communicative, not in obvious distress, morbidly obese HENT: Normocephalic, pupils  equally reacting to light and accommodation.  No scleral pallor or icterus noted. Oral mucosa is moist.  Chest: Diminished breath sounds bilaterally.  No crackles or wheezes noted.  CVS: S1 &S2 heard. No murmur.  Regular rate and rhythm. Abdomen: Soft, nontender, nondistended.  Bowel sounds are heard.  Liver is not palpable, no abdominal mass palpated Extremities: No cyanosis, clubbing or edema.  Peripheral pulses are palpable. Psych: Alert, awake and oriented, normal mood CNS:  No cranial nerve deficits.  Power equal in all extremities.  No sensory deficits noted.  No cerebellar signs.   Skin: Warm and dry.  No rashes noted.   Data Review: I have personally reviewed the following laboratory data and studies,  CBC: No results for input(s): WBC, NEUTROABS, HGB, HCT, MCV, PLT in the last 168 hours. Basic Metabolic Panel: No results for input(s): NA, K, CL, CO2, GLUCOSE, BUN, CREATININE, CALCIUM, MG, PHOS in the last 168 hours. Liver Function Tests: No results for input(s): AST, ALT, ALKPHOS, BILITOT, PROT, ALBUMIN in the last 168 hours. No results for input(s): LIPASE, AMYLASE in the last 168 hours. No results for input(s): AMMONIA in the last 168 hours. Cardiac Enzymes: No results for input(s): CKTOTAL, CKMB, CKMBINDEX, TROPONINI in the last 168 hours. BNP (last 3 results) No results for input(s): BNP in the last 8760 hours.  ProBNP (last 3 results) No results for input(s): PROBNP in the last 8760 hours.  CBG: Recent Labs  Lab 01/30/19 1124 01/30/19 1614 01/30/19 1735 01/30/19 2137 01/31/19 0603  GLUCAP 211* 60* 117* 149* 134*   No results found for this or any previous visit (from the past 240 hour(s)).   Studies: No results found.  Scheduled Meds: . amLODipine  10 mg Oral Daily  . atorvastatin  80 mg Oral q1800  . carvedilol  6.25 mg Oral BID WC  . clopidogrel  75 mg Oral Daily  . enoxaparin (LOVENOX) injection  40 mg Subcutaneous Q24H  . feeding supplement  1  Container Oral BID BM  . feeding supplement (PRO-STAT SUGAR FREE 64)  30 mL Oral TID  . insulin aspart  0-15 Units Subcutaneous TID WC  . insulin aspart  0-5 Units Subcutaneous QHS  . insulin aspart  4 Units Subcutaneous TID WC  . insulin glargine  18 Units Subcutaneous QHS  . linagliptin  5 mg Oral Daily  . lisinopril  40 mg Oral Daily  . pantoprazole  40 mg Oral Daily  . sertraline  100 mg Oral Daily  . sodium chloride flush  10-40 mL Intracatheter Q12H  . thiamine  100 mg Oral Daily    Continuous Infusions:   Flora Lipps, MD  [facility] 01/31/2019

## 2019-01-31 NOTE — Progress Notes (Signed)
Occupational Therapy Treatment Patient Details Name: Becky Stout MRN: [id number] DOB: [birth year] Today's Date: 01/31/2019    History of present illness 64 y.o. female with Past medical history of B12 deficiency, type II DM not on therapy, HTN, IDA, obesity, WPW syndrome.Pt dx with acute CVA, MRI reveals multiple cerebral and brainstem lacunar infarcts, acute metabolic encepholopathy, Hypertensive emergency, DKA, agorophobia, N/V, hypokalemia, and FTT.     OT comments  Pt making progress with functional goals. Pt following all commands today and very receptive to participating.  She was dry on arrival and toileted during session. Pt became agitated with OT at end of session for not leaving RW within her reach and turning on chair alarm. OT will continue to follow acutely  Follow Up Recommendations  SNF;Other (comment)(HH if pt continent and can toilet herself)    Equipment Recommendations  None recommended by OT    Recommendations for Other Services      Precautions / Restrictions Precautions Precautions: Fall Precaution Comments: very particular but receptive with redirection Restrictions Weight Bearing Restrictions: No       Mobility Bed Mobility Overal bed mobility: Modified Independent Bed Mobility: Supine to Sit Rolling: Modified independent (Device/Increase time)   Supine to sit: Modified independent (Device/Increase time)     General bed mobility comments: No assistance needed.  Transfers Overall transfer level: Modified independent Equipment used: Rolling walker (2 wheeled) Transfers: Sit to/from Stand Sit to Stand: Modified independent (Device/Increase time)         General transfer comment: No assistance needed from edge of bed or from toilet or to and from shower (used grab bar)    Balance Overall balance assessment: Needs assistance Sitting-balance support: Feet supported Sitting balance-Leahy Scale: Good     Standing balance support: Single  extremity supported;During functional activity Standing balance-Leahy Scale: Good                             ADL either performed or assessed with clinical judgement   ADL Overall ADL's : Needs assistance/impaired     Grooming: Wash/dry hands;Wash/dry face;Supervision/safety;Standing   Upper Body Bathing: Supervision/ safety;Sitting   Lower Body Bathing: Supervison/ safety;Set up;Sit to/from stand   Upper Body Dressing : Supervision/safety;Sitting   Lower Body Dressing: Min guard;Sit to/from stand   Toilet Transfer: Modified Independent;Ambulation;RW;Cueing for safety;Regular Toilet;Grab bars;BSC   Toileting- Clothing Manipulation and Hygiene: Supervision/safety;Sitting/lateral lean;Sit to/from stand       Functional mobility during ADLs: Modified independent;Rolling walker;Cueing for safety       Vision Baseline Vision/History: Wears glasses Wears Glasses: At all times Patient Visual Report: No change from baseline     Perception     Praxis      Cognition Arousal/Alertness: Awake/alert Behavior During Therapy: WFL for tasks assessed/performed Overall Cognitive Status: Impaired/Different from baseline Area of Impairment: Attention;Safety/judgement;Awareness;Problem solving                     Memory: Decreased short-term memory Following Commands: Follows one step commands with increased time       General Comments: Pt following all commands today and very receptive to participating.  She was dry on arrival and toileted during session.        Exercises     Shoulder Instructions       General Comments      Pertinent Vitals/ Pain       Pain Assessment: No/denies pain Pain Score: 0-No pain Pain Intervention(s):  Monitored during session  Home Living                                          Prior Functioning/Environment              Frequency  Min 1X/week        Progress Toward Goals  OT  Goals(current goals can now be found in the care plan section)  Progress towards OT goals: Progressing toward goals  Acute Rehab OT Goals Patient Stated Goal: to go home  Plan Discharge plan remains appropriate    Co-evaluation                 AM-PAC OT "6 Clicks" Daily Activity     Outcome Measure   Help from another person eating meals?: None Help from another person taking care of personal grooming?: A [place] Help from another person toileting, which includes using toliet, bedpan, or urinal?: A [place] Help from another person bathing (including washing, rinsing, drying)?: A [place] Help from another person to put on and taking off regular upper body clothing?: None Help from another person to put on and taking off regular lower body clothing?: A Joa 6 Click Score: 20    End of Session Equipment Utilized During Treatment: Rolling walker;Other (comment)(3 in 1)  OT Visit Diagnosis: Unsteadiness on feet (R26.81);Other abnormalities of gait and mobility (R26.89);Muscle weakness (generalized) (M62.81);History of falling (Z91.81);Other symptoms and signs involving cognitive function;Adult, failure to thrive (R62.7)   Activity Tolerance Patient tolerated treatment well   Patient Left with call bell/phone within reach;in chair;with chair alarm set   Nurse Communication Other (comment)(informed RN that pt toileted)        Time: [id number] OT Time Calculation (min): 26 min  Charges: OT General Charges $OT Visit: 1 Visit OT Treatments $Self Care/Home Management : 8-22 mins $Therapeutic Activity: 8-22 mins     Britt Bottom 01/31/2019, 12:34 PM

## 2019-02-01 LAB — GLUCOSE, CAPILLARY
Glucose-Capillary: 158 mg/dL — ABNORMAL HIGH (ref 70–99)
Glucose-Capillary: 135 mg/dL — ABNORMAL HIGH (ref 70–99)
Glucose-Capillary: 106 mg/dL — ABNORMAL HIGH (ref 70–99)
Glucose-Capillary: 149 mg/dL — ABNORMAL HIGH (ref 70–99)

## 2019-02-01 LAB — SARS CORONAVIRUS 2 (TAT 6-24 HRS): SARS Coronavirus 2: NEGATIVE

## 2019-02-01 MED ORDER — METFORMIN HCL 850 MG PO TABS: 850.0000 mg | ORAL_TABLET | Freq: Two times a day (BID) | ORAL | 2 refills | Status: AC

## 2019-02-01 MED ORDER — AMLODIPINE BESYLATE 10 MG PO TABS: 10.0000 mg | ORAL_TABLET | Freq: Every day | ORAL | 2 refills | Status: DC

## 2019-02-01 MED ORDER — LINAGLIPTIN 5 MG PO TABS: 5.0000 mg | ORAL_TABLET | Freq: Every day | ORAL | 2 refills | Status: DC

## 2019-02-01 MED ORDER — AMLODIPINE BESYLATE 10 MG PO TABS: 10.0000 mg | ORAL_TABLET | Freq: Every day | ORAL | 2 refills | Status: AC

## 2019-02-01 MED ORDER — CARVEDILOL 6.25 MG PO TABS: 6.2500 mg | ORAL_TABLET | Freq: Two times a day (BID) | ORAL | 2 refills | Status: DC

## 2019-02-01 MED ORDER — METFORMIN HCL 850 MG PO TABS: 850.0000 mg | ORAL_TABLET | Freq: Two times a day (BID) | ORAL | 2 refills | Status: DC

## 2019-02-01 MED ORDER — CYANOCOBALAMIN 1000 MCG PO TABS: 1000.0000 ug | ORAL_TABLET | Freq: Every day | ORAL | 2 refills | Status: AC

## 2019-02-01 MED ORDER — LINAGLIPTIN 5 MG PO TABS: 5.0000 mg | ORAL_TABLET | Freq: Every day | ORAL | 2 refills | Status: AC

## 2019-02-01 MED ORDER — LISINOPRIL 40 MG PO TABS: 40.0000 mg | ORAL_TABLET | Freq: Every day | ORAL | 2 refills | Status: AC

## 2019-02-01 MED ORDER — LISINOPRIL 40 MG PO TABS: 40.0000 mg | ORAL_TABLET | Freq: Every day | ORAL | 2 refills | Status: DC

## 2019-02-01 MED ORDER — THIAMINE HCL 100 MG PO TABS: 100.0000 mg | ORAL_TABLET | Freq: Every day | ORAL | 0 refills | Status: AC

## 2019-02-01 MED ORDER — THIAMINE HCL 100 MG PO TABS: 100.0000 mg | ORAL_TABLET | Freq: Every day | ORAL | 0 refills | Status: DC

## 2019-02-01 MED ORDER — SERTRALINE HCL 100 MG PO TABS: 100.0000 mg | ORAL_TABLET | Freq: Every day | ORAL | 2 refills | Status: DC

## 2019-02-01 MED ORDER — VITAMIN D 25 MCG (1000 UNIT) PO TABS: 1000.0000 [IU] | ORAL_TABLET | Freq: Every day | ORAL | 2 refills | Status: AC

## 2019-02-01 MED ORDER — CLOPIDOGREL BISULFATE 75 MG PO TABS: 75.0000 mg | ORAL_TABLET | Freq: Every day | ORAL | 2 refills | Status: DC

## 2019-02-01 MED ORDER — ATORVASTATIN CALCIUM 80 MG PO TABS: 80.0000 mg | ORAL_TABLET | Freq: Every day | ORAL | 2 refills | Status: DC

## 2019-02-01 MED ORDER — SERTRALINE HCL 100 MG PO TABS: 100.0000 mg | ORAL_TABLET | Freq: Every day | ORAL | 2 refills | Status: AC

## 2019-02-01 MED ORDER — CARVEDILOL 6.25 MG PO TABS: 6.2500 mg | ORAL_TABLET | Freq: Two times a day (BID) | ORAL | 2 refills | Status: AC

## 2019-02-01 MED ORDER — ATORVASTATIN CALCIUM 80 MG PO TABS: 80.0000 mg | ORAL_TABLET | Freq: Every day | ORAL | 2 refills | Status: AC

## 2019-02-01 MED ORDER — CLOPIDOGREL BISULFATE 75 MG PO TABS: 75.0000 mg | ORAL_TABLET | Freq: Every day | ORAL | 2 refills | Status: AC

## 2019-02-01 MED FILL — CLOPIDOGREL 75 MG TABLET: 75 | 30 days supply | Qty: 30 | Fill #0

## 2019-02-01 MED FILL — CARVEDILOL 6.25 MG TABLET: 6.25 | 30 days supply | Qty: 60 | Fill #0

## 2019-02-01 MED FILL — VITAMIN B-12 1000 MCG TABS: 1000 | 30 days supply | Qty: 30 | Fill #0

## 2019-02-01 MED FILL — metFORMIN HCL 850 MG TABS: 850 | 30 days supply | Qty: 60 | Fill #0

## 2019-02-01 MED FILL — VITAMIN D3 1,000 UNIT TAB: 25 MCG | 30 days supply | Qty: 30 | Fill #0

## 2019-02-01 MED FILL — LISINOPRIL 40 MG TABS: 40 | 30 days supply | Qty: 30 | Fill #0

## 2019-02-01 MED FILL — VITAMIN B-1 100 MG TABS: 100 | 30 days supply | Qty: 30 | Fill #0

## 2019-02-01 MED FILL — ATORVASTATIN CALCIUM 80 MG: 80 | 30 days supply | Qty: 30 | Fill #0

## 2019-02-01 MED FILL — AMLODIPINE BESYLATE 10 MG T: 10 | 30 days supply | Qty: 30 | Fill #0

## 2019-02-01 MED FILL — TRADJENTA 5 MG TABLET: 5 | 30 days supply | Qty: 30 | Fill #0

## 2019-02-01 MED FILL — SERTRALINE HCL 100 MG TAB: 100 | 30 days supply | Qty: 30 | Fill #0

## 2019-02-01 NOTE — TOC Transition Note (Addendum)
Transition of Care [facility]) - CM/SW Discharge Note   Patient Details  Name: Becky Stout MRN: [id number] Date of Birth: March 31, 1955  [facility] CM/SW Contact:  Pollie Friar, RN Phone Number: 02/01/2019, 1:36 PM   Clinical Narrative:    Pt discharging home with charity [facility] services through [place].  DME for home has been delivered to the room.  CM provided MATCH to cover the cost of her d/c meds. Medications filled and stored in the main pharmacy.  CM provided application for medication assistance in [place] in D/c packet. Sister is aware and will get filled out and turned in. Pt has PcP.  CM has provided sister information about CBG meter from [place] and other supplies. They plan on getting her a meter. Sister to provide transport home.    Final next level of care: [place] Barriers to Discharge: Inadequate or no insurance, Barriers Unresolved (comment)   Patient Goals and CMS Choice   CMS Medicare.gov Compare Post Acute Care list provided to:: Patient Choice offered to / list presented to : Patient, Sibling  Discharge Placement                       Discharge Plan and Services In-house Referral: Clinical Social Work Discharge Planning Services: CM Consult Post Acute Care Choice: Guadalupe Guerra          DME Arranged: 3-N-1, Walker rolling DME Agency: [employer] Date DME Agency Contacted: 02/01/19   Representative spoke with at DME Agency: [facility]ack HH Arranged: RN, PT, OT, Nurse's Aide, Social Work [employer] Agency: [place] ([place]) Date [place]: 02/01/19   Representative spoke with at [place] services---Donna  Social Determinants of Health ([place]) Interventions     Readmission Risk Interventions No flowsheet data found.

## 2019-02-01 NOTE — Progress Notes (Signed)
Physical Therapy Treatment Patient Details Name: Becky Stout MRN: [id number] DOB: [birth year] Today's Date: 02/01/2019    History of Present Illness 64 y.o. female with Past medical history of B12 deficiency, type II DM not on therapy, HTN, IDA, obesity, WPW syndrome.Pt dx with acute CVA, MRI reveals multiple cerebral and brainstem lacunar infarcts, acute metabolic encepholopathy, Hypertensive emergency, DKA, agorophobia, N/V, hypokalemia, and FTT.      PT Comments    Pt performed retrial of stair training this session.  She continues to require min guard when ascending stairs.  Pt continues to be on track to return home.  Pt performed LE HEP and PT issued HEP for continued home use.  Pt is progressing well.      Follow Up Recommendations  Home health PT;Supervision - Intermittent     Equipment Recommendations  3in1 (PT);Rolling walker with 5" wheels    Recommendations for Other Services       Precautions / Restrictions Precautions Precautions: Fall Restrictions Weight Bearing Restrictions: No    Mobility  Bed Mobility Overal bed mobility: Modified Independent Bed Mobility: Supine to Sit;Sit to Supine Rolling: Modified independent (Device/Increase time)   Supine to sit: Modified independent (Device/Increase time) Sit to supine: Modified independent (Device/Increase time)   General bed mobility comments: No assistance needed.  Transfers Overall transfer level: Modified independent Equipment used: Rolling walker (2 wheeled) Transfers: Sit to/from Stand Sit to Stand: Modified independent (Device/Increase time)            Ambulation/Gait Ambulation/Gait assistance: Modified independent (Device/Increase time) Gait Distance (Feet): 200 Feet Assistive device: Rolling walker (2 wheeled) Gait Pattern/deviations: Step-through pattern;Wide base of support Gait velocity: decreased   General Gait Details: Pt remains with wide BOS but functioning with in normal limits and  no assistance needed.  She was able to turn and back to seated surface without difficulty.   Stairs Stairs: Yes Stairs assistance: Min guard Stair Management: One rail Left Number of Stairs: 4 General stair comments: Pt performed forward to descend and backwards/sidways to descend.  She was guarded with flexed stance.  Cues for quad activation when ascending stairs.   Wheelchair Mobility    Modified Rankin (Stroke Patients Only) Modified Rankin (Stroke Patients Only) Pre-Morbid Rankin Score: Moderate disability Modified Rankin: Moderate disability     Balance Overall balance assessment: Needs assistance Sitting-balance support: Feet supported Sitting balance-Leahy Scale: Normal     Standing balance support: Single extremity supported;During functional activity Standing balance-Leahy Scale: Good                              Cognition Arousal/Alertness: Awake/alert Behavior During Therapy: WFL for tasks assessed/performed Overall Cognitive Status: Within Functional Limits for tasks assessed                                 General Comments: Pt continues to follow commands well, no redirection needed.  Performed all functional mobility without hesitation.      Exercises General Exercises - Lower Extremity Long Arc Quad: AROM;Both;10 reps;Seated Hip ABduction/ADduction: AROM;Both;10 reps;Supine Straight Leg Raises: Both;10 reps;Supine;AROM Hip Flexion/Marching: AROM;Both;10 reps;Seated    General Comments        Pertinent Vitals/Pain Pain Assessment: No/denies pain Faces Pain Scale: No hurt Pain Location: generalized discomfort. Pain Descriptors / Indicators: Burning Pain Intervention(s): Monitored during session;Repositioned    Home Living  Prior Function            PT Goals (current goals can now be found in the care plan section) Acute Rehab PT Goals Patient Stated Goal: to go home Potential to  Achieve Goals: Good Progress towards PT goals: Progressing toward goals    Frequency    Min 3X/week      PT Plan Current plan remains appropriate    Co-evaluation              AM-PAC PT "6 Clicks" Mobility   Outcome Measure  Help needed turning from your back to your side while in a flat bed without using bedrails?: None Help needed moving from lying on your back to sitting on the side of a flat bed without using bedrails?: None Help needed moving to and from a bed to a chair (including a wheelchair)?: None Help needed standing up from a chair using your arms (e.g., wheelchair or bedside chair)?: None Help needed to walk in hospital room?: None Help needed climbing 3-5 steps with a railing? : A Krikorian 6 Click Score: 23    End of Session   Activity Tolerance: Patient tolerated treatment well Patient left: in bed;with call bell/phone within reach;with bed alarm set Nurse Communication: Mobility status PT Visit Diagnosis: Other abnormalities of gait and mobility (R26.89)     Time: [id number] PT Time Calculation (min) (ACUTE ONLY): 19 min  Charges:  $Gait Training: 8-22 mins                     Erasmo Leventhal , PTA Acute Rehabilitation Services Pager [phone] Office [phone]     Becky Stout [name] 02/01/2019, 10:38 AM

## 2019-02-01 NOTE — Progress Notes (Signed)
PROGRESS NOTE  Becky Stout [id number] DOB: 1955-07-24 DOA: 11/03/2018 PCP: Abner Greenspan, MD   LOS: 90 days   Brief narrative: As per HPI, 64 year old female with past medical history of diabetes, hypertension, WPW syndrome, frequent falls confusion, acute CVA and metabolic encephalopathy.  Patient is waiting for nursing home placement.  Patient is said to not be participating in activities refusing to get up out of bed and just not wanting to do anything. Per physical therapy note patient is still needing assistance.  She did not want to go further down to the bathroom during her last physical therapy session because she complained of being tired.    Assessment/Plan:  Principal Problem:   DKA (diabetic ketoacidoses) (HCC) Active Problems:   Hypothyroidism   B12 deficiency   WOLFF (WOLFE)-PARKINSON-WHITE (WPW) SYNDROME   Essential hypertension   Hyperlipidemia   Poorly controlled type 2 diabetes mellitus (HCC)   Anxiety   Failure to thrive in adult   Acute lower UTI   High anion gap metabolic acidosis   Cerebral thrombosis with cerebral infarction   Acute cystitis without hematuria  Essential hypertension.  BP stable on amlodipine, Coreg, lisinopril and hydralazine. Monitor.  Morbid obesity with BMI 37.6 - PT on board. Recommending skilled nursing facility but unable to place due to lack of payer source.  GERD: on Protonix  Anxiety, depression: seen by psychiatry. continue Zoloft at the current dose on discharge  Acute CVA with left hemiparesis.  Status post 3 weeks of dual antiplatelet therapy.  Continue aspirin, Lipitor on discharge.[name]  Has been seen by PT regularly and patient has been slightly mobile.  PT recommends home health PT on discharge  DKA.  Resolved.  Continue Lantus and Tradjenta.  Patient has refused BMP.  Last A1c of 7.9 on 10/ 2020.  Patient refusing insulin and needles so have started the Metformin and Tradjenta while in the hospital.  Latest POC  today was 135.  Very high chance of noncompliance on insulin regimen  Debility.   Seen by physical therapy 01/31/2019 and recommended home health at this time.  VTE Prophylaxis: Lovenox  Code Status: DNR  Family Communication: None   Disposition Plan: Physical therapy have changed the recommendation to home health PT.  Social worker on board.  Plan is disposition home with 91 years old mom.  Patient's sisters were also contacted by the case manager.  Patient would need intermittent supervision on discharge.  We will also consider for home health PT, RN, OT, social worker in 3-1, rolling walker with wheels.  Family willing for discharge on 02/02/2019 and request Covid test.  Spoke with  social worker about it. We will request  COVID test today.  Likely disposition home with home health tomorrow.  Prescriptions have been signed in sent to transitional care pharmacy [facility] today.  Home health orders have also been signed including DME for 3in1 and rolling walker.  Consultants:  Neurology  Psychiatry   Procedures:  None  Antibiotics:  Anti-infectives (From admission, onward)   Start     Dose/Rate Route Frequency Ordered Stop   11/03/18 1930  cefTRIAXone (ROCEPHIN) 1 g in sodium chloride 0.9 % 100 mL IVPB  Status:  Discontinued     1 g 200 mL/hr over 30 Minutes Intravenous Every 24 hours 11/03/18 1724 11/06/18 1118   11/03/18 0530  cefTRIAXone (ROCEPHIN) 1 g in sodium chloride 0.9 % 100 mL IVPB     1 g 200 mL/hr over 30 Minutes Intravenous  Once 11/03/18 0525 11/03/18 0629     Subjective: Today, she denies any interval complaints.  Denies shortness of breath cough fever chills.   Objective: Vitals:   02/01/19 0739 02/01/19 1136  BP: (!) 138/57 (!) 118/54  Pulse: 71 67  Resp: 15 14  Temp: 98.4 F (36.9 C) 98.6 F (37 C)  SpO2: 100% 98%    Intake/Output Summary (Last 24 hours) at 02/01/2019 1308 Last data filed at 02/01/2019 0903 Gross per 24 hour  Intake 20 ml  Output -   Net 20 ml   Filed Weights   01/30/19 0344 01/31/19 0403 02/01/19 0622  Weight: 97 kg 100 kg 100 kg   Body mass index is 37.84 kg/m.   Physical Exam: General: Morbidly obese not in obvious distress HENT: Normocephalic, pupils equally reacting to light and accommodation.  No scleral pallor or icterus noted. Oral mucosa is moist.  Chest:  Clear breath sounds.  Diminished breath sounds bilaterally. No crackles or wheezes.  CVS: S1 &S2 heard. No murmur.  Regular rate and rhythm. Abdomen: Soft, nontender, nondistended.  Bowel sounds are heard.  Liver is not palpable, no abdominal mass palpated Extremities: No cyanosis, clubbing or edema.  Peripheral pulses are palpable. Psych: Alert, awake and oriented, normal mood CNS:  No cranial nerve deficits.  Power equal in all extremities.  No sensory deficits noted.  No cerebellar signs.   Skin: Warm and dry.  No rashes noted.  Data Review: I have personally reviewed the following laboratory data and studies,  CBC: No results for input(s): WBC, NEUTROABS, HGB, HCT, MCV, PLT in the last 168 hours. Basic Metabolic Panel: No results for input(s): NA, K, CL, CO2, GLUCOSE, BUN, CREATININE, CALCIUM, MG, PHOS in the last 168 hours. Liver Function Tests: No results for input(s): AST, ALT, ALKPHOS, BILITOT, PROT, ALBUMIN in the last 168 hours. No results for input(s): LIPASE, AMYLASE in the last 168 hours. No results for input(s): AMMONIA in the last 168 hours. Cardiac Enzymes: No results for input(s): CKTOTAL, CKMB, CKMBINDEX, TROPONINI in the last 168 hours. BNP (last 3 results) No results for input(s): BNP in the last 8760 hours.  ProBNP (last 3 results) No results for input(s): PROBNP in the last 8760 hours.  CBG: Recent Labs  Lab 01/31/19 1204 01/31/19 1647 01/31/19 2107 02/01/19 0622 02/01/19 1138  GLUCAP 141* 91 149* 158* 135*   No results found for this or any previous visit (from the past 240 hour(s)).   Studies: No results  found.  Scheduled Meds: . amLODipine  10 mg Oral Daily  . atorvastatin  80 mg Oral q1800  . carvedilol  6.25 mg Oral BID WC  . clopidogrel  75 mg Oral Daily  . enoxaparin (LOVENOX) injection  40 mg Subcutaneous Q24H  . feeding supplement  1 Container Oral BID BM  . feeding supplement (PRO-STAT SUGAR FREE 64)  30 mL Oral TID  . linagliptin  5 mg Oral Daily  . lisinopril  40 mg Oral Daily  . metFORMIN  850 mg Oral BID WC  . pantoprazole  40 mg Oral Daily  . sertraline  100 mg Oral Daily  . sodium chloride flush  10-40 mL Intracatheter Q12H  . thiamine  100 mg Oral Daily    Continuous Infusions:   Flora Lipps, MD  [facility] 02/01/2019

## 2019-02-02 LAB — GLUCOSE, CAPILLARY
Glucose-Capillary: 152 mg/dL — ABNORMAL HIGH (ref 70–99)
Glucose-Capillary: 271 mg/dL — ABNORMAL HIGH (ref 70–99)

## 2019-02-02 NOTE — Progress Notes (Signed)
Physical Therapy Treatment Patient Details Name: Becky Stout MRN: [id number] DOB: 1955-10-07 Today's Date: 02/02/2019    History of Present Illness Pt is a 64 y.o. female with Past medical history of B12 deficiency, type II DM not on therapy, HTN, IDA, obesity, WPW syndrome.Pt dx with acute CVA, MRI reveals multiple cerebral and brainstem lacunar infarcts, acute metabolic encepholopathy, Hypertensive emergency, DKA, agorophobia, N/V, hypokalemia, and FTT.      PT Comments    Pt continuing to make steady progress and remains on track for d/c home with f/u HHPT services. She participated in bed mobility, transfers, gait training and general LE strengthening therex this session. Pt's lunch had just arrived as PT arrived; therefore, pt not wanting to walk out into hallway this session. Plan is to d/c home soon with family support.   Pt would continue to benefit from skilled physical therapy services at this time while admitted and after d/c to address the below listed limitations in order to improve overall safety and independence with functional mobility.    Follow Up Recommendations  Home health PT;Supervision - Intermittent     Equipment Recommendations  3in1 (PT);Rolling walker with 5" wheels    Recommendations for Other Services       Precautions / Restrictions Precautions Precautions: Fall Restrictions Weight Bearing Restrictions: No    Mobility  Bed Mobility Overal bed mobility: Modified Independent                Transfers Overall transfer level: Needs assistance Equipment used: Rolling walker (2 wheeled) Transfers: Sit to/from Stand Sit to Stand: Min guard         General transfer comment: pt struggling a bit to achieve standing position from bed (at a standard height); however, able to perform with increased time, no physical assistance needed  Ambulation/Gait Ambulation/Gait assistance: Supervision Gait Distance (Feet): 20 Feet Assistive device:  Rolling walker (2 wheeled) Gait Pattern/deviations: Step-through pattern;Wide base of support Gait velocity: decreased   General Gait Details: pt overall steady with navigating within her room, to and from bathroom with RW and min guard for safety   Stairs             Wheelchair Mobility    Modified Rankin (Stroke Patients Only) Modified Rankin (Stroke Patients Only) Pre-Morbid Rankin Score: Moderate disability Modified Rankin: Moderate disability     Balance Overall balance assessment: Needs assistance Sitting-balance support: Feet supported Sitting balance-Leahy Scale: Normal     Standing balance support: During functional activity Standing balance-Leahy Scale: Fair                              Cognition Arousal/Alertness: Awake/alert Behavior During Therapy: WFL for tasks assessed/performed Overall Cognitive Status: Within Functional Limits for tasks assessed                                        Exercises General Exercises - Lower Extremity Ankle Circles/Pumps: AROM;Both;20 reps;Seated Long Arc Quad: AROM;Strengthening;Both;20 reps;Seated Hip ABduction/ADduction: Standing;AROM;Strengthening;Both;10 reps Hip Flexion/Marching: AROM;Strengthening;Both;20 reps;Seated Mini-Sqauts: AROM;Strengthening;10 reps    General Comments        Pertinent Vitals/Pain Pain Assessment: No/denies pain    Home Living                      Prior Function  PT Goals (current goals can now be found in the care plan section) Acute Rehab PT Goals PT Goal Formulation: With patient Time For Goal Achievement: 02/06/19 Potential to Achieve Goals: Good Progress towards PT goals: Progressing toward goals    Frequency    Min 3X/week      PT Plan Current plan remains appropriate    Co-evaluation              AM-PAC PT "6 Clicks" Mobility   Outcome Measure  Help needed turning from your back to your side while in  a flat bed without using bedrails?: None Help needed moving from lying on your back to sitting on the side of a flat bed without using bedrails?: None Help needed moving to and from a bed to a chair (including a wheelchair)?: None Help needed standing up from a chair using your arms (e.g., wheelchair or bedside chair)?: None Help needed to walk in hospital room?: None Help needed climbing 3-5 steps with a railing? : A Eichhorst 6 Click Score: 23    End of Session   Activity Tolerance: Patient tolerated treatment well Patient left: in bed;with call bell/phone within reach Nurse Communication: Mobility status PT Visit Diagnosis: Other abnormalities of gait and mobility (R26.89)     Time: [id number] PT Time Calculation (min) (ACUTE ONLY): 16 min  Charges:  $Therapeutic Activity: 8-22 mins                     Anastasio Champion, DPT  Acute Rehabilitation Services Pager [phone] Office [place] 02/02/2019, 12:21 PM

## 2019-02-02 NOTE — Progress Notes (Signed)
Pt given discharge summary and discharged via sister and transportation.

## 2019-02-02 NOTE — Discharge Summary (Signed)
Physician Discharge Summary  Patient ID: KROSBY BRING MRN: [id number] DOB/AGE: [birth year] 64 y.o.  Admit date: 11/03/2018 Discharge date: 02/02/2019  Admission Diagnoses:  Discharge Diagnoses:  Principal Problem:   DKA (diabetic ketoacidoses) ([place])   Acute CVA with left hemiparesis  Active Problems:   Hypothyroidism   B12 deficiency   WOLFF (WOLFE)-PARKINSON-WHITE (WPW) SYNDROME   Essential hypertension   Hyperlipidemia   Poorly controlled type 2 diabetes mellitus (HCC)   Anxiety   Failure to thrive in adult   Acute lower UTI   High anion gap metabolic acidosis   Cerebral thrombosis with cerebral infarction   Acute cystitis without hematuria   Discharged Condition: stable  Hospital Course:  Patient is a 64 year old Caucasian female with past medical history significant for hypertension, type 2 diabetes, WPW syndrome, confusion and frequent falls.  Patient was admitted with DKA, suspected acute CVA, but negative imaging studies and metabolic encephalopathy.  Patient had prolonged hospital stay.  DKA has resolved.  With physical and Occupational Therapy input, patient's mobility has improved significantly.  The initial plan was to discharge patient to skilled nursing facility for subacute rehabilitation.  Over time, patient has improved significantly and will be discharged back home with home health PT/OT/nursing and social work follow-up.  Patient will follow with a primary care provider on discharge.   Anion gap acidosis, possibly secondary to diabetic ketoacidosis: -Resolved. -HbA1c was 7.9%. -PCP will kindly continue to monitor blood sugar on discharge.  Hypertension: -Blood pressure is at goal.   -Continue to monitor closely on discharge.  Hyperlipidemia: -Continue Lipitor 80 mg p.o. once daily.  Electrolyte abnormalities: Resolved. Continue to monitor potassium and magnesium level on discharge.  History of acute CVA with left-sided hemiparesis: -MRI of the brain  showed scattered small acute infarcts in both cerebral hemispheres and brainstem along with chronic basal ganglia infarction moderately advanced cerebral atrophy. -Echocardiogram showed normal left ventricular ejection fraction with impaired left ventricular diastolic filling suggestive of diastolic heart failure.   -Carotid duplex unremarkable -Transcranial Dopplers were unremarkable.  -Continue with statin. -Discharge patient home with home health PT/OT/Nursing and social services.  Consults: neurology and psychiatry  Significant Diagnostic Studies:  MRI of the brain revealed: 1. Incomplete, severely motion degraded examination. 2. Scattered small acute infarcts in both cerebral hemispheres and brainstem. 3. Chronic basal ganglia infarcts and moderately advanced cerebral Atrophy.  CT Head without contrast revealed: 1. No acute intracranial findings. 2. Atrophy and white matter microvascular disease. 3. Chronic deep white matter infarction in the RIGHT external capsule  Discharge Exam: Blood pressure 129/80, pulse 75, temperature 97.7 F (36.5 C), temperature source Oral, resp. rate 16, height 5\' 4"  (1.626 m), weight 99 kg, SpO2 98 %.  Disposition: Discharge disposition: 06-Home-Health Care Svc   Discharge Instructions    Diet - low sodium heart healthy   Complete by: As directed    Diet Carb Modified   Complete by: As directed    Increase activity slowly   Complete by: As directed      Allergies as of 02/02/2019      Reactions   Shellfish Allergy Anaphylaxis   Can't breathe   Gluten Meal    Gluten free   Hydrocod Polst-cpm Polst Er    REACTION: makes cough worse   Oseltamivir Phosphate    REACTION: hives   Tetracycline    REACTION: hives      Medication List    STOP taking these medications   aspirin EC 81 MG tablet  levothyroxine 50 MCG tablet Commonly known as: SYNTHROID     TAKE these medications   amLODipine 10 MG tablet Commonly known as:  NORVASC Take 1 tablet (10 mg total) by mouth daily.   atorvastatin 80 MG tablet Commonly known as: LIPITOR Take 1 tablet (80 mg total) by mouth daily at 6 PM.   carvedilol 6.25 MG tablet Commonly known as: COREG Take 1 tablet (6.25 mg total) by mouth 2 (two) times daily with a meal.   cholecalciferol 25 MCG (1000 UT) tablet Commonly known as: VITAMIN D3 Take 1 tablet (1,000 Units total) by mouth daily.   clopidogrel 75 MG tablet Commonly known as: PLAVIX Take 1 tablet (75 mg total) by mouth daily.   cyanocobalamin 1000 MCG tablet Take 1 tablet (1,000 mcg total) by mouth daily.   linagliptin 5 MG Tabs tablet Commonly known as: TRADJENTA Take 1 tablet (5 mg total) by mouth daily.   lisinopril 40 MG tablet Commonly known as: ZESTRIL Take 1 tablet (40 mg total) by mouth daily.   metFORMIN 850 MG tablet Commonly known as: GLUCOPHAGE Take 1 tablet (850 mg total) by mouth 2 (two) times daily with a meal.   sertraline 100 MG tablet Commonly known as: ZOLOFT Take 1 tablet (100 mg total) by mouth daily.   thiamine 100 MG tablet Take 1 tablet (100 mg total) by mouth daily.            Durable Medical Equipment  (From admission, onward)         Start     Ordered   12/22/18 0555  For home use only DME standard manual wheelchair with seat cushion  Once    Comments: Patient suffers from ambulatory dysfunction which impairs their ability to perform daily activities like walking in the home.  A walking aid will not resolve issue with performing activities of daily living. A wheelchair will allow patient to safely perform daily activities. Patient can safely propel the wheelchair in the home or has a caregiver who can provide assistance. Length of need 6 months. Accessories: elevating leg rests, wheel locks, extensions and anti-tippers.   12/22/18 0554   12/22/18 0554  For home use only DME 3 n 1  Once     12/22/18 0553   12/22/18 0554  For home use only DME Walker rolling   Once    Comments: 5" wheels  Question:  Patient needs a walker to treat with the following condition  Answer:  Ambulatory dysfunction   12/22/18 0553         Follow-up Information    [facility] Follow up.   Why: The home health agency will contact you for the first home visit. Contact information: [phone]          Signed: Bonnell Public 02/02/2019, 1:01 PM

## 2019-02-04 ENCOUNTER — Telehealth: Payer: Self-pay | Admitting: *Deleted

## 2019-02-04 ENCOUNTER — Telehealth: Payer: Self-pay

## 2019-02-04 NOTE — Telephone Encounter (Signed)
Tried to call and complete TCM and schedule hospital follow up visit. Mailbox is full and cannot accept any messages

## 2019-02-04 NOTE — Telephone Encounter (Signed)
Patient's sister Becky Stout wanted to let Becky Stout know that Becky Stout came home from the hospital Saturday. Becky Stout stated that her other sister had to call 911 this morning because she could not get Becky Stout up. Becky Stout stated that when the paramedics came this morning Becky Stout's blood pressure was 220/106 and they tried to get her to go to the ER, but she refused. Becky Stout stated that Becky Stout is dragging her left foot now and complains of it hurting.Becky Stout stated that Becky Stout her other sister advised her that now Becky Stout is vomiting with any exertion. Becky Stout stated that Becky Stout is refusing to go back to the hospital and wants to know what Becky Stout recommends? Becky Stout stated that her sister's blood pressure now per [employer] is 185/93.

## 2019-02-05 NOTE — Telephone Encounter (Signed)
Spoke to [place] and she said pt isn't doing any better today. She is still vomiting quite often. They left a large water cup beside her last night and when they woke up this morning she had filled the cup up with vomit. Pt isn't walking or moving much she just sits in her chair all day and they have to try and get her from her chair to the bedside commode but that's really hard to do also. Morey Hummingbird said her vomit is turing bright red but the family isn't sure if it's blood or if it's red due to her only wanting to drink grape juice and then mixing with her bile. Morey Hummingbird said she is hardly drinking or eating, they can get her to drink grape juice and usually she will only eat about 2 bite of food and that's it. Morey Hummingbird did realize that before she went to the hospital in Oct she was vomiting often at home but Morey Hummingbird thought it was because she was eating old food, but now pt is back home and family has her eating fresh food she is still throwing up but she wasn't doing that in the hospital so Morey Hummingbird isn't sure if it's because they were giving her GERD med in the hospital or if this is a new issue of her vomiting often since coming home  Morey Hummingbird did say their other sister Margarita Grizzle has called [facility] to have a HH eval to see if there is any services they can offer to help pt with. So if you get a call from them that's why

## 2019-02-05 NOTE — Telephone Encounter (Signed)
That sounds good- keep me posted  If she changes her mind about going to ER at any time- please do take her  Thanks

## 2019-02-05 NOTE — Telephone Encounter (Signed)
Thanks for letting me know.   I doubt there is much they can do if she refuses medical care (I talked to her sister Becky Stout yesterday)  Please ask if she is doing any better today?   How is the n/v ? Is she taking in fluids?

## 2019-02-08 ENCOUNTER — Emergency Department: Payer: Self-pay

## 2019-02-08 ENCOUNTER — Telehealth: Payer: Self-pay | Admitting: Family Medicine

## 2019-02-08 ENCOUNTER — Emergency Department
Admission: EM | Admit: 2019-02-08 | Discharge: 2019-03-05 | Disposition: A | Discharge status: Skilled Nursing Facility | Payer: Self-pay | Attending: Emergency Medicine | Admitting: Emergency Medicine

## 2019-02-08 ENCOUNTER — Telehealth: Payer: Self-pay | Admitting: Cardiovascular Disease

## 2019-02-08 DIAGNOSIS — Z7984 Long term (current) use of oral hypoglycemic drugs: Secondary | ICD-10-CM | POA: Insufficient documentation

## 2019-02-08 DIAGNOSIS — Z20822 Contact with and (suspected) exposure to covid-19: Secondary | ICD-10-CM | POA: Insufficient documentation

## 2019-02-08 DIAGNOSIS — R531 Weakness: Secondary | ICD-10-CM | POA: Insufficient documentation

## 2019-02-08 DIAGNOSIS — E876 Hypokalemia: Secondary | ICD-10-CM | POA: Insufficient documentation

## 2019-02-08 DIAGNOSIS — Z8673 Personal history of transient ischemic attack (TIA), and cerebral infarction without residual deficits: Secondary | ICD-10-CM | POA: Insufficient documentation

## 2019-02-08 DIAGNOSIS — E119 Type 2 diabetes mellitus without complications: Secondary | ICD-10-CM | POA: Insufficient documentation

## 2019-02-08 DIAGNOSIS — Z9119 Patient's noncompliance with other medical treatment and regimen: Secondary | ICD-10-CM | POA: Insufficient documentation

## 2019-02-08 DIAGNOSIS — F0390 Unspecified dementia without behavioral disturbance: Secondary | ICD-10-CM | POA: Insufficient documentation

## 2019-02-08 DIAGNOSIS — I110 Hypertensive heart disease with heart failure: Secondary | ICD-10-CM | POA: Insufficient documentation

## 2019-02-08 DIAGNOSIS — R410 Disorientation, unspecified: Secondary | ICD-10-CM | POA: Insufficient documentation

## 2019-02-08 DIAGNOSIS — E039 Hypothyroidism, unspecified: Secondary | ICD-10-CM | POA: Insufficient documentation

## 2019-02-08 DIAGNOSIS — Z79899 Other long term (current) drug therapy: Secondary | ICD-10-CM | POA: Insufficient documentation

## 2019-02-08 DIAGNOSIS — I5022 Chronic systolic (congestive) heart failure: Secondary | ICD-10-CM | POA: Insufficient documentation

## 2019-02-08 DIAGNOSIS — I456 Pre-excitation syndrome: Secondary | ICD-10-CM | POA: Insufficient documentation

## 2019-02-08 DIAGNOSIS — Z7902 Long term (current) use of antithrombotics/antiplatelets: Secondary | ICD-10-CM | POA: Insufficient documentation

## 2019-02-08 LAB — COMPREHENSIVE METABOLIC PANEL
ALT: 30 U/L (ref 0–44)
AST: 27 U/L (ref 15–41)
Albumin: 3.2 g/dL — ABNORMAL LOW (ref 3.5–5.0)
Alkaline Phosphatase: 67 U/L (ref 38–126)
Anion gap: 12 (ref 5–15)
BUN: 9 mg/dL (ref 8–23)
CO2: 23 mmol/L (ref 22–32)
Calcium: 8.6 mg/dL — ABNORMAL LOW (ref 8.9–10.3)
Chloride: 100 mmol/L (ref 98–111)
Creatinine, Ser: 0.57 mg/dL (ref 0.44–1.00)
GFR calc Af Amer: >60 mL/min (ref >60)
GFR calc non Af Amer: >60 mL/min (ref >60)
Glucose, Bld: 106 mg/dL — ABNORMAL HIGH (ref 70–99)
Potassium: 2.9 mmol/L — ABNORMAL LOW (ref 3.5–5.1)
Sodium: 135 mmol/L (ref 135–145)
Total Bilirubin: 0.8 mg/dL (ref 0.3–1.2)
Total Protein: 6.1 g/dL — ABNORMAL LOW (ref 6.5–8.1)

## 2019-02-08 LAB — CBC
HCT: 34.5 % — ABNORMAL LOW (ref 36.0–46.0)
Hemoglobin: 11.5 g/dL — ABNORMAL LOW (ref 12.0–15.0)
MCH: 30.7 pg (ref 26.0–34.0)
MCHC: 33.3 g/dL (ref 30.0–36.0)
MCV: 92.2 fL (ref 80.0–100.0)
Platelets: 279 10*3/uL (ref 150–400)
RBC: 3.74 MIL/uL — ABNORMAL LOW (ref 3.87–5.11)
RDW: 13.6 % (ref 11.5–15.5)
WBC: 7.5 10*3/uL (ref 4.0–10.5)
nRBC: 0 % (ref 0.0–0.2)

## 2019-02-08 MED ORDER — POTASSIUM CHLORIDE CRYS ER 20 MEQ PO TBCR
40.0000 meq | EXTENDED_RELEASE_TABLET | Freq: Once | ORAL | Status: AC
  Administered 2019-02-08: 40 meq via ORAL
  Filled 2019-02-08: qty 2

## 2019-02-08 MED ORDER — LORAZEPAM 1 MG PO TABS
1.0000 mg | ORAL_TABLET | Freq: Once | ORAL | Status: AC
  Administered 2019-02-08: 1 mg via ORAL
  Filled 2019-02-08: qty 1

## 2019-02-08 NOTE — Telephone Encounter (Signed)
Pt refuses transport to the hospital for life threatening medical conditions- dehydration/ diarrhea/ stroke.   Letter written re: medical necessity of hospitalization

## 2019-02-08 NOTE — ED Notes (Signed)
Pt refused meal tray °

## 2019-02-08 NOTE — ED Notes (Addendum)
Pt calm and cooperative. Denies SI/HI and AVH.

## 2019-02-08 NOTE — NC FL2 (Deleted)
[place] LEVEL OF CARE SCREENING TOOL     IDENTIFICATION  Patient Name: Becky Stout Birthdate: 1955/03/21 Sex: female Admission Date (Current Location): 02/08/2019  [facility] and [place] Number:  Engineering geologist and Address:         Provider Number:    Attending Physician Name and Address:  Harvest Dark, MD  Relative Name and Phone Number:       Current Level of Care: Hospital Recommended Level of Care: [place] Prior Approval Number:    Date Approved/Denied:   PASRR Number: Pending  Discharge Plan: SNF    Current Diagnoses: Patient Active Problem List   Diagnosis Date Noted  . Acute cystitis without hematuria   . Cerebral thrombosis with cerebral infarction 11/04/2018  . Failure to thrive in adult 11/03/2018  . Acute lower UTI 11/03/2018  . High anion gap metabolic acidosis [id number]  . DKA (diabetic ketoacidoses) ([place]) 11/03/2018  . Anxiety 09/15/2015  . Obesity 05/22/2015  . Hyponatremia 01/10/2014  . Facial rash 06/18/2013  . Joint pain 06/18/2013  . Frequent urination 12/21/2012  . Colon cancer screening 04/27/2012  . Routine general medical examination at a health care facility 04/10/2011  . Other screening mammogram 04/08/2011  . Gluten intolerance 04/08/2011  . Hyperlipidemia 03/24/2010  . Poorly controlled type 2 diabetes mellitus ([place]) 03/24/2010  . MAMMOGRAM, ABNORMAL 03/24/2010  . Tachycardia 03/14/2008  . B12 deficiency 11/12/2007  . Hypothyroidism 11/06/2007  . Essential hypertension 10/26/2007  . WOLFF (WOLFE)-PARKINSON-WHITE (WPW) SYNDROME 04/30/2007  . SUPRAVENTRICULAR TACHYCARDIA 05/18/2006    Orientation RESPIRATION BLADDER Height & Weight     Self, Time, Situation, Place  Normal Continent(occasional episodes of incontinence) Weight:   Height:     BEHAVIORAL SYMPTOMS/MOOD NEUROLOGICAL BOWEL NUTRITION STATUS      Continent Diet  AMBULATORY STATUS COMMUNICATION OF NEEDS Skin    Extensive Assist Verbally Normal                       Personal Care Assistance Level of Assistance  Bathing, Feeding, Dressing Bathing Assistance: Limited assistance Feeding assistance: Limited assistance Dressing Assistance: Limited assistance     Functional Limitations Info    Sight Info: Adequate Hearing Info: Adequate Speech Info: Adequate    SPECIAL CARE FACTORS FREQUENCY  PT (By licensed PT), OT (By licensed OT)     PT Frequency: min 5xweekly OT Frequency: min 5xweekly            Contractures      Additional Factors Info                  Current Medications (02/08/2019):  This is the current hospital active medication list No current facility-administered medications for this encounter.   Current Outpatient Medications  Medication Sig Dispense Refill  . amLODipine (NORVASC) 10 MG tablet Take 1 tablet (10 mg total) by mouth daily. 30 tablet 2  . atorvastatin (LIPITOR) 80 MG tablet Take 1 tablet (80 mg total) by mouth daily at 6 PM. 30 tablet 2  . carvedilol (COREG) 6.25 MG tablet Take 1 tablet (6.25 mg total) by mouth 2 (two) times daily with a meal. 30 tablet 2  . cholecalciferol (VITAMIN D3) 25 MCG (1000 UT) tablet Take 1 tablet (1,000 Units total) by mouth daily. 30 tablet 2  . clopidogrel (PLAVIX) 75 MG tablet Take 1 tablet (75 mg total) by mouth daily. 30 tablet 2  . cyanocobalamin 1000 MCG tablet Take 1 tablet (1,000 mcg  total) by mouth daily. 30 tablet 2  . linagliptin (TRADJENTA) 5 MG TABS tablet Take 1 tablet (5 mg total) by mouth daily. 30 tablet 2  . lisinopril (ZESTRIL) 40 MG tablet Take 1 tablet (40 mg total) by mouth daily. 30 tablet 2  . metFORMIN (GLUCOPHAGE) 850 MG tablet Take 1 tablet (850 mg total) by mouth 2 (two) times daily with a meal. 60 tablet 2  . sertraline (ZOLOFT) 100 MG tablet Take 1 tablet (100 mg total) by mouth daily. 30 tablet 2  . thiamine 100 MG tablet Take 1 tablet (100 mg total) by mouth daily. 30 tablet 0      Discharge Medications: Please see discharge summary for a list of discharge medications.  Relevant Imaging Results:  Relevant Lab Results:   Additional Information Private [id number]  Anselm Pancoast, RN
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
## 2019-02-08 NOTE — ED Notes (Signed)
Pt to CT scan.

## 2019-02-08 NOTE — Care Management (Signed)
RN CM: Becky Stout, advanced home care, confirmed home health was not allowed to see patient at family request until 1/14 when nurse was allowed in the home however family was not admitted as patient was deemed unsafe for home and in need of higher level of care. Patient was refusing any home health services and refusing to return to the hospital.

## 2019-02-08 NOTE — ED Provider Notes (Signed)
4:08 PM Assumed care for off going team.   Blood pressure (!) 155/59, pulse 67, temperature 98.7 F (37.1 C), temperature source Oral, resp. rate 18, SpO2 100 %.  See their HPI for full report but in brief   Per off going team.  IVC from home refusing to walk. Admitted from Oct-jan 9 for strokes and was able eventually able to walk and was d/c to home. Pt AO here and denies any threat to self. IVC was rescinded. D/w Sister and she was walking 2 days but has not walked for the past 4 days. PT has come to home but refused to get out of bed. Needs SNF. PT/SW consulted. Psych involved because she wanted to go home and do not think she has capacity. But now pt is okay to go to SNF.     Vanessa Wabash, MD 02/08/19 2104

## 2019-02-08 NOTE — ED Triage Notes (Signed)
Pt to [facility] with EMS under IVC.  IVC paper states the patient is "refusing medical treatment."  EMS reports the patient has had a TIA and is unable to walk and ifearful of falling.

## 2019-02-08 NOTE — ED Notes (Signed)
Pt asking for help to use the bathroom.  Pt then apologized and stated she was unable to transfer into the wheelchair (2 Rn's were ready to assist her).  Pt stated she is unable to get to the bathroom at home as well.   Pt is wearing a brief at this time.

## 2019-02-08 NOTE — Consult Note (Signed)
  64 year old patient who presents with noncompliance with medical treatment for stroke and lower extremity weakness.  Patient was brought in by sister and mother due to her lack of ability to care for herself.  Psychiatry was consulted and throughout the course of the evaluation determined that patient had no previous psychiatric history and had no current psychiatric complaints she denies SI denies HI denies AVH.  Patient was spoken to briefly regarding the necessity of some of these treatments if she were to ever improve her condition.  Patient showed some confusion during this conversation but ultimately agreed to accept the medical treatment recommended by ER physicians.  No formal capacity decision made at this time.  Patient does not require inpatient psychiatric hospitalization.  Case discussed with EDP

## 2019-02-08 NOTE — ED Notes (Signed)
This Probation officer and Academic librarian cleaned patient, put on clean gown and changed bed linen.  Patient's soiled nightgown placed in belongings bag and on patient's bed per patient request.  Pt cooperative.

## 2019-02-08 NOTE — Telephone Encounter (Signed)
3 attempts to schedule fu appt from recall list.   Deleting recall.   

## 2019-02-08 NOTE — ED Notes (Signed)
IVC  RESCINDED  PENDING  MEDICAL  WORKUP

## 2019-02-08 NOTE — ED Notes (Signed)
Pt fighting and unwilling to complete CT scan. Pt covered in feces. Returned to [employer]

## 2019-02-08 NOTE — TOC Initial Note (Addendum)
Transition of Care [facility]) - Initial/Assessment Note    Patient Details  Name: Becky Stout MRN: [id number] Date of Birth: April 08, 1955  [facility] CM/SW Contact:    Anselm Pancoast, RN Phone Number: 02/08/2019, 4:59 PM  Clinical Narrative:                 Seeking SNF placement. Family will private pay until Medicaid approved. Patient is spending down her savings in order for Medicaid application. Spoke with sister who states patient will need placement as she lives with her elderly mother who is not able to assist as much in care. Patient refusing to work with PT at this time.   Expected Discharge Plan: [place]     Patient Goals and CMS Choice Patient states their goals for this hospitalization and ongoing recovery are:: get back to walking      Expected Discharge Plan and Services Expected Discharge Plan: [place] arrangements for the past 2 months: Single Family Home                                      Prior Living Arrangements/Services Living arrangements for the past 2 months: Single Family Home Lives with:: Parents Patient language and need for interpreter reviewed:: Yes Do you feel safe going back to the place where you live?: Yes      Need for Family Participation in Patient Care: Yes (Comment) Care giver support system in place?: Yes (comment) Current home services: DME(walker) Criminal Activity/Legal Involvement Pertinent to Current Situation/Hospitalization: No - Comment as needed  Activities of Daily Living      Permission Sought/Granted                  Emotional Assessment Appearance:: Appears older than stated age Attitude/Demeanor/Rapport: Engaged Affect (typically observed): Accepting Orientation: : Oriented to Self, Oriented to Place, Oriented to  Time, Oriented to Situation Alcohol / Substance Use: Never Used Psych Involvement: Yes (comment)(cleared by psych)  Admission  diagnosis:  beh med eval ems Patient Active Problem List   Diagnosis Date Noted  . Acute cystitis without hematuria   . Cerebral thrombosis with cerebral infarction 11/04/2018  . Failure to thrive in adult 11/03/2018  . Acute lower UTI 11/03/2018  . High anion gap metabolic acidosis [id number]  . DKA (diabetic ketoacidoses) ([place]) 11/03/2018  . Anxiety 09/15/2015  . Obesity 05/22/2015  . Hyponatremia 01/10/2014  . Facial rash 06/18/2013  . Joint pain 06/18/2013  . Frequent urination 12/21/2012  . Colon cancer screening 04/27/2012  . Routine general medical examination at a health care facility 04/10/2011  . Other screening mammogram 04/08/2011  . Gluten intolerance 04/08/2011  . Hyperlipidemia 03/24/2010  . Poorly controlled type 2 diabetes mellitus ([place]) 03/24/2010  . MAMMOGRAM, ABNORMAL 03/24/2010  . Tachycardia 03/14/2008  . B12 deficiency 11/12/2007  . Hypothyroidism 11/06/2007  . Essential hypertension 10/26/2007  . WOLFF (WOLFE)-PARKINSON-WHITE (WPW) SYNDROME 04/30/2007  . SUPRAVENTRICULAR TACHYCARDIA 05/18/2006   PCP:  Abner Greenspan, MD Pharmacy:   [facility] [address] [place] - [place] [address] [place] [address] Phone: [phone] Fax: [place], [place] - [address] [place] [address] Phone: [phone] Fax: [phone]     Social Determinants of Health (SDOH) Interventions  Readmission Risk Interventions No flowsheet data found.

## 2019-02-08 NOTE — ED Notes (Signed)
Pt refused to work with PT 

## 2019-02-08 NOTE — Progress Notes (Signed)
PT Cancellation Note  Patient Details Name: Becky Stout MRN: [id number] DOB: 1955-03-14   Cancelled Treatment:    Reason Eval/Treat Not Completed: Patient declined, no reason specified Pt initially willing to participate, needed light assist to get to sitting but generally did well.  She, however, could not don her slippers even with plenty of time/enouragement to try and figure it out.  Once PT assisted with getting them on she said "Can we do this tomorrow?"  PT made numerous attempts to encourage her to participate.  She had ultimately decided she was not going to and repeatedly (and pleasantly) continued to say no to walking/PT today.  Kreg Shropshire, DPT 02/08/2019, 5:46 PM

## 2019-02-08 NOTE — NC FL2 (Signed)
[place] LEVEL OF CARE SCREENING TOOL     IDENTIFICATION  Patient Name: Becky Stout Birthdate: 1955/02/13 Sex: female Admission Date (Current Location): 02/08/2019  [facility] and [place] Number:  Engineering geologist and Address:         Provider Number:    Attending Physician Name and Address:  Harvest Dark, MD  Relative Name and Phone Number:       Current Level of Care: Hospital Recommended Level of Care: [place] Prior Approval Number:    Date Approved/Denied:   PASRR Number: [id number]  Discharge Plan: SNF    Current Diagnoses: Patient Active Problem List   Diagnosis Date Noted  . Acute cystitis without hematuria   . Cerebral thrombosis with cerebral infarction 11/04/2018  . Failure to thrive in adult 11/03/2018  . Acute lower UTI 11/03/2018  . High anion gap metabolic acidosis [id number]  . DKA (diabetic ketoacidoses) ([place]) 11/03/2018  . Anxiety 09/15/2015  . Obesity 05/22/2015  . Hyponatremia 01/10/2014  . Facial rash 06/18/2013  . Joint pain 06/18/2013  . Frequent urination 12/21/2012  . Colon cancer screening 04/27/2012  . Routine general medical examination at a health care facility 04/10/2011  . Other screening mammogram 04/08/2011  . Gluten intolerance 04/08/2011  . Hyperlipidemia 03/24/2010  . Poorly controlled type 2 diabetes mellitus ([place]) 03/24/2010  . MAMMOGRAM, ABNORMAL 03/24/2010  . Tachycardia 03/14/2008  . B12 deficiency 11/12/2007  . Hypothyroidism 11/06/2007  . Essential hypertension 10/26/2007  . WOLFF (WOLFE)-PARKINSON-WHITE (WPW) SYNDROME 04/30/2007  . SUPRAVENTRICULAR TACHYCARDIA 05/18/2006    Orientation RESPIRATION BLADDER Height & Weight     Self, Time, Situation, Place  Normal Continent(occasional episodes of incontinence) Weight:   Height:     BEHAVIORAL SYMPTOMS/MOOD NEUROLOGICAL BOWEL NUTRITION STATUS      Continent Diet  AMBULATORY STATUS COMMUNICATION OF NEEDS Skin    Extensive Assist Verbally Normal                       Personal Care Assistance Level of Assistance  Bathing, Feeding, Dressing Bathing Assistance: Limited assistance Feeding assistance: Limited assistance Dressing Assistance: Limited assistance     Functional Limitations Info    Sight Info: Adequate Hearing Info: Adequate Speech Info: Adequate    SPECIAL CARE FACTORS FREQUENCY  PT (By licensed PT), OT (By licensed OT)     PT Frequency: min 5xweekly OT Frequency: min 5xweekly            Contractures      Additional Factors Info                  Current Medications (02/08/2019):  This is the current hospital active medication list No current facility-administered medications for this encounter.   Current Outpatient Medications  Medication Sig Dispense Refill  . amLODipine (NORVASC) 10 MG tablet Take 1 tablet (10 mg total) by mouth daily. 30 tablet 2  . atorvastatin (LIPITOR) 80 MG tablet Take 1 tablet (80 mg total) by mouth daily at 6 PM. 30 tablet 2  . carvedilol (COREG) 6.25 MG tablet Take 1 tablet (6.25 mg total) by mouth 2 (two) times daily with a meal. 30 tablet 2  . cholecalciferol (VITAMIN D3) 25 MCG (1000 UT) tablet Take 1 tablet (1,000 Units total) by mouth daily. 30 tablet 2  . clopidogrel (PLAVIX) 75 MG tablet Take 1 tablet (75 mg total) by mouth daily. 30 tablet 2  . cyanocobalamin 1000 MCG tablet Take 1 tablet (1,000 mcg  total) by mouth daily. 30 tablet 2  . linagliptin (TRADJENTA) 5 MG TABS tablet Take 1 tablet (5 mg total) by mouth daily. 30 tablet 2  . lisinopril (ZESTRIL) 40 MG tablet Take 1 tablet (40 mg total) by mouth daily. 30 tablet 2  . metFORMIN (GLUCOPHAGE) 850 MG tablet Take 1 tablet (850 mg total) by mouth 2 (two) times daily with a meal. 60 tablet 2  . sertraline (ZOLOFT) 100 MG tablet Take 1 tablet (100 mg total) by mouth daily. 30 tablet 2  . thiamine 100 MG tablet Take 1 tablet (100 mg total) by mouth daily. 30 tablet 0      Discharge Medications: Please see discharge summary for a list of discharge medications.  Relevant Imaging Results:  Relevant Lab Results:   Additional Information Private [id number]  Anselm Pancoast, RN

## 2019-02-09 ENCOUNTER — Emergency Department: Payer: Self-pay

## 2019-02-09 MED ORDER — POTASSIUM CHLORIDE CRYS ER 20 MEQ PO TBCR
20.0000 meq | EXTENDED_RELEASE_TABLET | Freq: Two times a day (BID) | ORAL | Status: AC
  Administered 2019-02-09 – 2019-02-12 (×6): 20 meq via ORAL
  Filled 2019-02-09 (×6): qty 1

## 2019-02-09 MED ORDER — POTASSIUM CHLORIDE CRYS ER 20 MEQ PO TBCR
40.0000 meq | EXTENDED_RELEASE_TABLET | Freq: Once | ORAL | Status: AC
  Administered 2019-02-09: 40 meq via ORAL
  Filled 2019-02-09: qty 2

## 2019-02-09 NOTE — ED Provider Notes (Signed)
-----------------------------------------   2:02 AM on 02/09/2019 -----------------------------------------  CT head interpreted per Dr. Golden Circle:  Chronic atrophic and ischemic changes similar to that noted on the  prior exam.    ----------------------------------------- 6:25 AM on 02/09/2019 -----------------------------------------  No events overnight.  Patient remains in the ED pending clinical social work disposition.   Paulette Blanch, MD 02/09/19 [phone]

## 2019-02-09 NOTE — ED Notes (Signed)
Pt given meal tray.

## 2019-02-09 NOTE — ED Notes (Signed)
Patient easily aroused with physical therapy, having PT evaluation at this time, will continue to monitor.

## 2019-02-09 NOTE — ED Notes (Signed)
Patient ate 50% of breakfast, and had beverage, no signs of distress.

## 2019-02-09 NOTE — Evaluation (Addendum)
Physical Therapy Evaluation Patient Details Name: Becky Stout MRN: [id number] DOB: 1955-11-02 Today's Date: 02/09/2019   History of Present Illness  Becky Stout is a 64 y.o. female  s/p UTI, scattered embolic CVAs and confusion with a fall. PMHx: B12 deficiency, type 2 diabetes not on treatment, hypertension, obesity, WPW syndrome. Pt DC to home after extensive admission at [facility], pt became weak, emetic, altered, and less mobile, refusing [place] services set up for her.  Clinical Impression  Pt seen with above diagnosis. Pt currently with functional limitations due to the deficits listed below (see "PT Problem List"). At time of eval, pt has K+ 2.9 from prior day, no labs this date, no clear reason for hypokalemia seen in MD notes. Upon entry, pt in bed, made awake and agreeable to participate. Pt is clam, cooperative, and interactive, but disoriented to situation at times and requires frequent redirection. She has anxiety regarding her movement and general activity, and requires frequent redirection and encouragement to participate in basic activity. The patient is a limited historian, and unable to really give details about the what happened at home over the last few days since being DC from [facility]. Of note, pt has several weeks of PT/OT notes from [facility] detailing cognition impairment which would indicate a DC to home alone somewhat concerning to this author. [facility] has notes regarding emesis at home and bed bound status, however pt is not able to corroborate any detail. Pt requires minA to come to sitting at EOB, where after 30-45sec she is able to sit unsupported with fair trunk control. Pt reluctance to attempt AMB but with two attempts to come to standing, pt requires modA, struggling with establishing upright stance with RW, unable to come fully upright at all, collapses onto bed each time in less than 20sec. Functional mobility assessment demonstrates increased effort/time requirements, poor  tolerance, and need for physical assistance, whereas the patient performed these at a higher level of independence PTA. Limitations at evaluation are striking given extensive rehab progress at [facility] which culminated just 7 days ago, wherein pt was rising to stadning without physical support and AMB up to 244ft c RW. Pt will benefit from skilled PT intervention to increase independence and safety with basic mobility in preparation for discharge to the venue listed below.       Follow Up Recommendations SNF;Supervision for mobility/OOB;Supervision/Assistance - 24 hour    Equipment Recommendations       Recommendations for Other Services       Precautions / Restrictions Precautions Precautions: Fall Restrictions Weight Bearing Restrictions: No      Mobility  Bed Mobility Overal bed mobility: Needs Assistance Bed Mobility: Supine to Sit;Sit to Supine     Supine to sit: Min assist Sit to supine: Min assist   General bed mobility comments: maximal effort and 2-3 minutes to come to sitting, eventually given minA as a courtesy.  Transfers Overall transfer level: Needs assistance Equipment used: Rolling walker (2 wheeled) Transfers: Sit to/from Stand Sit to Stand: Mod assist         General transfer comment: ModA provided, pt struggling with trunk righting to upright, has limited tolerance to standing, and leans against bed throughout. 2 attempts made, then pt more anxous d/t instability in legs  Ambulation/Gait                Stairs            Wheelchair Mobility    Modified Rankin (Stroke Patients Only)  Balance   Sitting-balance support: Single extremity supported;Feet supported Sitting balance-Leahy Scale: Poor     Standing balance support: Bilateral upper extremity supported;During functional activity Standing balance-Leahy Scale: Zero                               Pertinent Vitals/Pain Pain Assessment: No/denies pain    Home  Living Family/patient expects to be discharged to:: Private residence Living Arrangements: Alone Available Help at Discharge: Family;Available PRN/intermittently(Sister/mother) Type of Home: Apartment Home Access: Level entry     Home Layout: One level Home Equipment: (unknown at this time)      Prior Function           Comments: Previously independent with cane household distances several months agol AMB up to 278ft at [facility] prior to DC this month     Hand Dominance   Dominant Hand: Left    Extremity/Trunk Assessment                Communication   Communication: No difficulties  Cognition Arousal/Alertness: Awake/alert Behavior During Therapy: Restless;Impulsive                     Orientation Level: Situation;Disoriented to   Memory: Decreased short-term memory Following Commands: Follows one step commands with increased time Safety/Judgement: Decreased awareness of deficits Awareness: Emergent Problem Solving: Requires verbal cues General Comments: mildly anxious about all proposed activity, urgent need to cease and return to supine to rest, but easily encourged and redirected.      General Comments      Exercises     Assessment/Plan    PT Assessment Patient needs continued PT services  PT Problem List Decreased strength;Decreased activity tolerance;Decreased balance;Decreased mobility;Decreased knowledge of use of DME;Decreased cognition;Decreased safety awareness       PT Treatment Interventions DME instruction;Gait training;Functional mobility training;Therapeutic activities;Therapeutic exercise;Balance training;Neuromuscular re-education;Patient/family education;Cognitive remediation    PT Goals (Current goals can be found in the Care Plan section)  Acute Rehab PT Goals Patient Stated Goal: to go home PT Goal Formulation: With patient Time For Goal Achievement: 02/23/19 Potential to Achieve Goals: Poor    Frequency Min 2X/week    Barriers to discharge Decreased caregiver support;Inaccessible home environment      Co-evaluation               AM-PAC PT "6 Clicks" Mobility  Outcome Measure Help needed turning from your back to your side while in a flat bed without using bedrails?: A [place] Help needed moving from lying on your back to sitting on the side of a flat bed without using bedrails?: A [place] Help needed moving to and from a bed to a chair (including a wheelchair)?: A Lot Help needed standing up from a chair using your arms (e.g., wheelchair or bedside chair)?: A Lot Help needed to walk in hospital room?: Total Help needed climbing 3-5 steps with a railing? : Total 6 Click Score: 12    End of Session   Activity Tolerance: Patient tolerated treatment well;Patient limited by fatigue Patient left: in bed Nurse Communication: Mobility status PT Visit Diagnosis: Other abnormalities of gait and mobility (R26.89);Unsteadiness on feet (R26.81);Difficulty in walking, not elsewhere classified (R26.2);Muscle weakness (generalized) (M62.81)    Time: [id number] PT Time Calculation (min) (ACUTE ONLY): 19 min   Charges:   PT Evaluation $PT Eval Moderate Complexity: 1 Mod          12:13 PM,  02/09/19 Etta Grandchild, PT, DPT Physical Therapist - [facility]  [phone] ([place])    Louise C 02/09/2019, 12:06 PM

## 2019-02-09 NOTE — ED Notes (Signed)
Patients sister called Margarita Grizzle) wanted to check status of patient and to let her know that her sister and mother loved her.

## 2019-02-09 NOTE — ED Notes (Signed)
Pt. Currently sleeping in 19 Hallway bed.  Awaiting placement.

## 2019-02-09 NOTE — ED Notes (Signed)
VOL/Pending Placement 

## 2019-02-09 NOTE — ED Notes (Signed)
Patient is alert and oriented, she is pleasant, asking for food, offered snack, but she refused , she is drinking grape juice, will continue to monitor.

## 2019-02-09 NOTE — ED Notes (Signed)
Pt returned from CT °

## 2019-02-09 NOTE — ED Notes (Signed)
Patient turned, and brief changed, no signs of distress, Patient is pleasant and cooperative.

## 2019-02-10 NOTE — ED Notes (Signed)
Pt given breakfast tray

## 2019-02-10 NOTE — Progress Notes (Signed)
Patient does not have capacity to make medical decisions per Dr Sheliah Mends, see note from 02/08/19.  Psychiatrically clear.  Waylan Boga, PMHNP

## 2019-02-10 NOTE — ED Notes (Signed)
Gave pt dinner tray  

## 2019-02-10 NOTE — ED Notes (Signed)
Pt brief changed by this tech and Katie EDT.

## 2019-02-10 NOTE — ED Notes (Signed)
VOL/Pending Placement 

## 2019-02-10 NOTE — ED Provider Notes (Signed)
-----------------------------------------   5:27 AM on 02/10/2019 -----------------------------------------   Blood pressure (!) 130/47, pulse 79, temperature 98.9 F (37.2 C), temperature source Oral, resp. rate 16, SpO2 95 %.  The patient has been calm and cooperative, resting comfortably currently.  There have been no acute events since the last update.  Awaiting disposition plan from Behavioral Medicine and/or Social Work team(s).    Harvest Dark, MD 02/10/19 [phone]

## 2019-02-10 NOTE — Progress Notes (Signed)
CSW faxed out patient's clinical information to SNF's in [facility] for review. Patient is currently uninsured and according to previous [facility] staff member's note family is willing to private pay for placement until Medicaid is approved.   Madilyn Fireman, MSW, LCSW-A Transitions of Care  Clinical Social Worker  [facility] Emergency Departments  Medical ICU [phone]

## 2019-02-10 NOTE — ED Notes (Signed)
Pt brief and pad changed.

## 2019-02-11 NOTE — ED Notes (Signed)
Pt cleaned, depend and linen changed by nurse techs and this Probation officer.  Pt had to be told she could not refuse to be clean and dry while in the ER.  Pt refuses to lay on her side or change positions.

## 2019-02-11 NOTE — ED Notes (Signed)
VOL  PENDING  PLACEMENT 

## 2019-02-11 NOTE — ED Notes (Signed)
Hourly rounding reveals patient in room. No complaints, stable, in no acute distress. Q15 minute rounds and monitoring via Rover and Officer to continue.   

## 2019-02-11 NOTE — ED Provider Notes (Signed)
-----------------------------------------   5:01 AM on 02/11/2019 -----------------------------------------   Blood pressure (!) 138/58, pulse 76, temperature 98.5 F (36.9 C), temperature source Oral, resp. rate 16, SpO2 95 %.  The patient had no acute events since last update.  Calm and cooperative at this time.  Disposition is pending per Psychiatry/Behavioral Medicine team recommendations.     Alfred Levins, [place], MD 02/11/19 [phone]

## 2019-02-11 NOTE — ED Notes (Signed)
Report to include Situation, Background, Assessment, and Recommendations received from Amy RN. Patient alert and oriented, warm and dry, in no acute distress. Patient denies SI, HI, AVH and pain. Patient made aware of Q15 minute rounds and Rover and Officer presence for their safety. Patient instructed to come to me with needs or concerns.   

## 2019-02-11 NOTE — ED Notes (Signed)
Pts wet  brief and chux changed.

## 2019-02-12 LAB — RESPIRATORY PANEL BY RT PCR (FLU A&B, COVID)
Influenza A by PCR: NEGATIVE
Influenza B by PCR: NEGATIVE
SARS Coronavirus 2 by RT PCR: NEGATIVE

## 2019-02-12 MED ORDER — LISINOPRIL 10 MG PO TABS
40.0000 mg | ORAL_TABLET | Freq: Every day | ORAL | Status: DC
  Administered 2019-02-12 – 2019-03-04 (×18): 40 mg via ORAL
  Filled 2019-02-12 (×21): qty 4

## 2019-02-12 MED ORDER — SERTRALINE HCL 50 MG PO TABS
100.0000 mg | ORAL_TABLET | Freq: Every day | ORAL | Status: DC
  Administered 2019-02-12 – 2019-03-05 (×21): 100 mg via ORAL
  Filled 2019-02-12 (×21): qty 2

## 2019-02-12 MED ORDER — CARVEDILOL 6.25 MG PO TABS
6.2500 mg | ORAL_TABLET | Freq: Two times a day (BID) | ORAL | Status: DC
  Administered 2019-02-12 – 2019-03-05 (×40): 6.25 mg via ORAL
  Filled 2019-02-12 (×39): qty 1

## 2019-02-12 MED ORDER — AMLODIPINE BESYLATE 5 MG PO TABS
10.0000 mg | ORAL_TABLET | Freq: Every day | ORAL | Status: DC
  Administered 2019-02-12 – 2019-03-04 (×19): 10 mg via ORAL
  Filled 2019-02-12 (×21): qty 2

## 2019-02-12 MED ORDER — THIAMINE HCL 100 MG PO TABS
100.0000 mg | ORAL_TABLET | Freq: Every day | ORAL | Status: DC
  Administered 2019-02-12 – 2019-03-05 (×21): 100 mg via ORAL
  Filled 2019-02-12 (×21): qty 1

## 2019-02-12 MED ORDER — LINAGLIPTIN 5 MG PO TABS
5.0000 mg | ORAL_TABLET | Freq: Every day | ORAL | Status: DC
  Administered 2019-02-12 – 2019-03-04 (×20): 5 mg via ORAL
  Filled 2019-02-12 (×23): qty 1

## 2019-02-12 MED ORDER — VITAMIN D 25 MCG (1000 UNIT) PO TABS
1000.0000 [IU] | ORAL_TABLET | Freq: Every day | ORAL | Status: DC
  Administered 2019-02-12 – 2019-03-05 (×21): 1000 [IU] via ORAL
  Filled 2019-02-12 (×21): qty 1

## 2019-02-12 MED ORDER — CLOPIDOGREL BISULFATE 75 MG PO TABS
75.0000 mg | ORAL_TABLET | Freq: Every day | ORAL | Status: DC
  Administered 2019-02-12 – 2019-03-05 (×22): 75 mg via ORAL
  Filled 2019-02-12 (×22): qty 1

## 2019-02-12 MED ORDER — ATORVASTATIN CALCIUM 20 MG PO TABS
80.0000 mg | ORAL_TABLET | Freq: Every day | ORAL | Status: DC
  Administered 2019-02-12 – 2019-03-04 (×21): 80 mg via ORAL
  Filled 2019-02-12 (×21): qty 4

## 2019-02-12 MED ORDER — METFORMIN HCL 850 MG PO TABS
850.0000 mg | ORAL_TABLET | Freq: Two times a day (BID) | ORAL | Status: DC
  Administered 2019-02-12 – 2019-03-05 (×40): 850 mg via ORAL
  Filled 2019-02-12 (×48): qty 1

## 2019-02-12 MED ORDER — PANTOPRAZOLE SODIUM 40 MG PO TBEC
40.0000 mg | DELAYED_RELEASE_TABLET | Freq: Every day | ORAL | Status: DC
  Administered 2019-02-12 – 2019-03-05 (×21): 40 mg via ORAL
  Filled 2019-02-12 (×21): qty 1

## 2019-02-12 NOTE — ED Notes (Signed)
Patient dried at this time 

## 2019-02-12 NOTE — ED Notes (Signed)
Hourly rounding reveals patient in room. No complaints, stable, in no acute distress. Q15 minute rounds and monitoring via Rover and Officer to continue.   

## 2019-02-12 NOTE — Progress Notes (Signed)
PT Cancellation Note  Patient Details Name: Becky Stout MRN: [id number] DOB: 1955/07/12   Cancelled Treatment:    Reason Eval/Treat Not Completed: Patient declined, no reason specified.  Pt declined, stating that she has not felt well today and that she is afraid she is going to fall.  PT explained benefit of therapy for fall prevention and pt was still adamant about not participating.  Will re-attempt later when pt is appropriate.  Roxanne Gates, PT, DPT  Roxanne Gates 02/12/2019, 6:10 PM

## 2019-02-12 NOTE — ED Notes (Signed)
Patient dries at this time.

## 2019-02-12 NOTE — ED Notes (Signed)
Pt cleaned, applied a dry diaper and placed pure wick with help of Hewan RN

## 2019-02-12 NOTE — ED Notes (Signed)
Report off to hewan rn

## 2019-02-12 NOTE — ED Notes (Signed)
Report to include Situation, Background, Assessment, and Recommendations received from Amy RN. Patient alert and oriented, warm and dry, in no acute distress. Patient denies SI, HI, AVH and pain. Patient made aware of Q15 minute rounds and Rover and Officer presence for their safety. Patient instructed to come to me with needs or concerns.   

## 2019-02-12 NOTE — ED Notes (Signed)
Resumed care from [place] rn.  Pt watching tv.  Pt calm and cooperative.

## 2019-02-12 NOTE — ED Provider Notes (Signed)
-----------------------------------------   3:10 AM on 02/12/2019 -----------------------------------------   Blood pressure (!) 129/49, pulse 77, temperature 98.2 F (36.8 C), temperature source Oral, resp. rate 18, SpO2 97 %.  The patient is calm and cooperative at this time.  There have been no acute events since the last update.  Awaiting disposition plan from Behavioral Medicine and/or Social Work team(s).   Hinda Kehr, MD 02/12/19 [phone]

## 2019-02-12 NOTE — ED Notes (Signed)
Pt ate about 75% of meal. Pt also given two grap juices. Pt lights turned back off and pt resting at this time.

## 2019-02-12 NOTE — ED Notes (Signed)
Pt given meal tray.

## 2019-02-13 NOTE — ED Notes (Signed)
Pt. Resting in bed.  Pt. Has no questions or concerns at this time.

## 2019-02-13 NOTE — ED Notes (Signed)
Hourly rounding reveals patient in room. No complaints, stable, in no acute distress. Q15 minute rounds and monitoring via Rover and Officer to continue.   

## 2019-02-13 NOTE — ED Notes (Signed)
VOL/Pending placement 

## 2019-02-13 NOTE — Care Management (Addendum)
TOC RN CM: Patient is refusing therapy which will disqualify her from SNF. Will try to locate a long term care bed. \  Discussed case with Otila Kluver @ [facility]-case being reviewed for possible LTC placement under private pay.

## 2019-02-13 NOTE — ED Notes (Signed)
Pt and pts bed cleansed; new chucks, new linen, new purewick in place. Hildred Alamin, EDT, Mimi, EDT, and [place], RN assisted this tech in doing this. Pts lights turned off per request; pt resting at this time.

## 2019-02-13 NOTE — ED Provider Notes (Signed)
-----------------------------------------   5:40 AM on 02/13/2019 -----------------------------------------   Blood pressure 136/80, pulse 69, temperature 98.6 F (37 C), temperature source Oral, resp. rate 17, SpO2 96 %.  The patient is calm and cooperative at this time.  There have been no acute events since the last update.  Awaiting disposition plan from Behavioral Medicine and/or Social Work team(s).   Paulette Blanch, MD 02/13/19 [phone]

## 2019-02-14 NOTE — Care Management (Signed)
TOC RN CM: Reviewed case with Principle, LTC to assist with private pay and facility resistance to accept patient due to no insurance.

## 2019-02-14 NOTE — ED Notes (Signed)
Pt was checked for wetness and was found to be dry. Purewick still being used, canister was emptied out and replaced with a new one.

## 2019-02-14 NOTE — ED Notes (Signed)
Report off to rebecca rn 

## 2019-02-14 NOTE — ED Notes (Signed)
Resumed care from [facility].  Pt sleeping.

## 2019-02-14 NOTE — ED Provider Notes (Signed)
-----------------------------------------   7:10 AM on 02/14/2019 -----------------------------------------   Blood pressure (!) 162/61, pulse 67, temperature 98.2 F (36.8 C), temperature source Oral, resp. rate 17, SpO2 97 %.  The patient is calm and cooperative at this time.  There have been no acute events since the last update.  Awaiting disposition plan from Behavioral Medicine and/or Social Work team(s).    Merlyn Lot, MD 02/14/19 [phone]

## 2019-02-14 NOTE — ED Notes (Signed)
Patient was given a sandwhich tray and juice upon request.

## 2019-02-14 NOTE — ED Notes (Signed)
meds given  Pt calm and cooperative.

## 2019-02-14 NOTE — ED Notes (Signed)
Breakfast tray given and eaten.

## 2019-02-14 NOTE — Progress Notes (Signed)
Physical Therapy Treatment Patient Details Name: Becky Stout MRN: [id number] DOB: 1956/01/20 Today's Date: 02/14/2019    History of Present Illness Becky Stout is a 64 y.o. female  s/p UTI, scattered embolic CVAs and confusion with a fall. PMHx: B12 deficiency, type 2 diabetes not on treatment, hypertension, obesity, WPW syndrome. Pt DC to home after extensive admission at [place], pt became weak, emetic, altered, and less mobile, refusing [place] services set up for her.    PT Comments    Pt in bed upon entry, lights out, awake, interactive. Pt continues to present with cognitive impairment consistent with several weeks of PT notes at [facility] last month. Pt generally with flat affect, apprehension to nearly all movement OOB, high falls anxiety, poor insight into ramification of prolonged bed-rest despite frequent education. Pt requires redirection in session, perseverating about return to supine, distracted each time RN enters the room, but generally is easy to redirect to task. Pt refuses not only attempted standing, but generally will not come forward to EOB enough to allow feet to rest on floor as she is confident that she will fall despite reassurance that [facility] Curia will provide safety assist. Pt is fully oriented to location and situation but in describing her circumstances for arriving to the ED demonstrates limited insight, confident that had she just stayed at home bed bound she would have eventually be strong enough to move independently. Will continue to follow but her low drive and desire to improve her physical independence with basic mobility will be her biggest hurdle to making improvements. Pt really needs more encouragement for mobility and daily OOB to chair with nursing team. Of note, bed adjustment controls not functioning at time of visit, RN made aware but not remedied while in room.     Follow Up Recommendations  SNF;Supervision for mobility/OOB;Supervision/Assistance - 24 hour     Equipment Recommendations  None recommended by PT    Recommendations for Other Services       Precautions / Restrictions Precautions Precautions: Fall Restrictions Weight Bearing Restrictions: No    Mobility  Bed Mobility Overal bed mobility: Needs Assistance       Supine to sit: Supervision Sit to supine: Supervision   General bed mobility comments: Sits at EOB for exercises, refuses to scoot hips forward d/t fears of falling,a asks to return to supine several times.  Transfers                    Ambulation/Gait                 Stairs             Wheelchair Mobility    Modified Rankin (Stroke Patients Only)       Balance Overall balance assessment: History of Falls Sitting-balance support: Single extremity supported;Feet unsupported;Feet supported Sitting balance-Leahy Scale: Good                                      Cognition Arousal/Alertness: Awake/alert Behavior During Therapy: WFL for tasks assessed/performed Overall Cognitive Status: History of cognitive impairments - at baseline                                 General Comments: Poor insight of situation despite full orientation; perseverates, has flat affect, easily distrated, motivation is poor, and pt  is anxious about falling.      Exercises Other Exercises Other Exercises: Seated LAQ 2x10 bilat, AA/ROM (partial assist at end-range due to limited TKE ROM) Other Exercises: Seated Marching 1x10 bilat, tactile cues to encourage full range Other Exercises: Seated closed chain ankle DF 1x15 bilat, heavy cues without    General Comments        Pertinent Vitals/Pain Pain Assessment: No/denies pain(asked several times, gives unspecific responses, appears comfortable)    Home Living                      Prior Function            PT Goals (current goals can now be found in the care plan section) Acute Rehab PT Goals Patient Stated  Goal: to go home PT Goal Formulation: With patient Time For Goal Achievement: 02/23/19 Potential to Achieve Goals: Poor Progress towards PT goals: Progressing toward goals    Frequency    Min 2X/week      PT Plan Current plan remains appropriate    Co-evaluation              AM-PAC PT "6 Clicks" Mobility   Outcome Measure  Help needed turning from your back to your side while in a flat bed without using bedrails?: A Life Help needed moving from lying on your back to sitting on the side of a flat bed without using bedrails?: A [place] Help needed moving to and from a bed to a chair (including a wheelchair)?: A Lot Help needed standing up from a chair using your arms (e.g., wheelchair or bedside chair)?: A Lot Help needed to walk in hospital room?: Total Help needed climbing 3-5 steps with a railing? : Total 6 Click Score: 12    End of Session   Activity Tolerance: Patient tolerated treatment well;Patient limited by fatigue Patient left: in bed Nurse Communication: Mobility status PT Visit Diagnosis: Other abnormalities of gait and mobility (R26.89);Unsteadiness on feet (R26.81);Difficulty in walking, not elsewhere classified (R26.2);Muscle weakness (generalized) (M62.81)     Time: [id number] PT Time Calculation (min) (ACUTE ONLY): 19 min  Charges:  $Therapeutic Exercise: 8-22 mins                     2:17 PM, 02/14/19 Etta Grandchild, PT, DPT Physical Therapist - [facility]  [phone] ([place])    Marlin C 02/14/2019, 2:09 PM

## 2019-02-14 NOTE — ED Notes (Signed)
Lunch given.

## 2019-02-14 NOTE — ED Notes (Signed)
Pt checked for wetness by this tech and Wells Guiles, RN; pt is wet and had a bowel movement (dark stool noted). Pt's brief, gown, and blankets changed. When cleaning pt this tech noted redness to her bottom and placed a pillow to keep her off her bottom for some time. Lights are turned off as requested by the pt.

## 2019-02-14 NOTE — ED Notes (Signed)
PT at bedside with patient

## 2019-02-15 NOTE — ED Notes (Signed)
Pt brief checked by this writer, pt remains dry, urine canister remains less then half full. Pt resting quietly with lights off and has no further request at this time. This Probation officer introduced self to pt and advised her that if she should need anything to let me know.

## 2019-02-15 NOTE — ED Notes (Signed)
Pt given meal tray.

## 2019-02-15 NOTE — ED Provider Notes (Signed)
-----------------------------------------   2:29 AM on 02/15/2019 -----------------------------------------   Blood pressure 127/64, pulse 73, temperature 98 F (36.7 C), temperature source Oral, resp. rate 18, SpO2 96 %.  The patient had no acute events since last update.  Calm and cooperative at this time.  Disposition is pending per Psychiatry/Behavioral Medicine team recommendations.     Rudene Re, MD 02/15/19 [phone]

## 2019-02-15 NOTE — ED Notes (Signed)
Checked patient at this time patient clean and dry at this time.

## 2019-02-15 NOTE — ED Notes (Signed)
Patient dried and changed gown

## 2019-02-15 NOTE — Care Management (Signed)
TOC RN CM: Bed request sent to [facility] for possible placement.   Sent requested information to Principle, LTC to continue to pursue possible placement.

## 2019-02-15 NOTE — ED Notes (Signed)
This tech and Wells Guiles, RN into check pt for wetness; pt remains dried at this time. The canister containing pts urine is still under half way full. Pillow was moved to her left side to relief some pressure off of her bottom. Pt thankful and continues to rest. No concerns at this time. Will continue to monitor Q15 minute rounds.

## 2019-02-15 NOTE — ED Notes (Signed)
VOL/Pending Placement 

## 2019-02-15 NOTE — ED Notes (Signed)
Pt brief checked by this writer, pt remains dry, urine canister remains less then half full. Pt resting quietly with lights off and has no further request at this time.

## 2019-02-15 NOTE — ED Notes (Signed)
Patient checked at this time patient is clean and dry

## 2019-02-15 NOTE — Social Work (Signed)
TOC SW: Faxed FL2 - [facility], [place] and [facility]: [place].  Both facilities have availability for private pay patients.

## 2019-02-16 NOTE — ED Notes (Signed)
Emptied canister of urine. Checked pt. Pt clean and dry.

## 2019-02-16 NOTE — ED Notes (Addendum)
Changed purewick and made sure pt clean and dry. No output at this time. Attempted to get pt to drink water. Took one sip.

## 2019-02-16 NOTE — ED Notes (Signed)
Pt brief checked by this writer, pt remains dry, urine canister remains less then half full. Pt resting quietly with lights off and has no further request at this time. Pt offered to be repositioned and declined.

## 2019-02-16 NOTE — ED Provider Notes (Signed)
-----------------------------------------   7:43 AM on 02/16/2019 -----------------------------------------   Blood pressure (!) 148/55, pulse 67, temperature 98.1 F (36.7 C), temperature source Oral, resp. rate 16, SpO2 98 %.  The patient is calm and cooperative at this time.  There have been no acute events since the last update.  Awaiting disposition plan from Behavioral Medicine and/or Social Work team(s).   Hinda Kehr, MD 02/16/19 [phone]

## 2019-02-16 NOTE — ED Notes (Signed)
Pt given breakfast tray

## 2019-02-16 NOTE — ED Notes (Signed)
Pt checked by this EDT pt is clean and dry at this time. Pt external suction cath container was emptied noted to be 344mL.

## 2019-02-16 NOTE — ED Notes (Signed)
Pt given lunch tray and grape juice.

## 2019-02-16 NOTE — ED Notes (Signed)
Pt. Sleeping in bed 20A at this time.

## 2019-02-17 MED ORDER — SODIUM CHLORIDE 0.9 % IV BOLUS: 1000.0000 mL | Freq: Once | INTRAVENOUS | Status: DC
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
## 2019-02-17 NOTE — ED Notes (Signed)
Attempted to start IV, pt. Refused and began to get very upset.  Pt. Told why IV was needed, pt. Became more upset.  MD notified.  Pt. Encouraged to drink fluids

## 2019-02-17 NOTE — ED Notes (Signed)
Pt clean and dry at this time. 

## 2019-02-17 NOTE — ED Notes (Signed)
Pt asking about bedroom slippers that was taken away from her earlier today. Pt informed that the bedroom slippers are in her belongings bag and locked up.

## 2019-02-17 NOTE — ED Notes (Signed)
Pt had huge bowel movement. Cleaned pt and applied new purewick, tubing, and cannister.

## 2019-02-17 NOTE — ED Provider Notes (Signed)
-----------------------------------------   7:31 AM on 02/17/2019 -----------------------------------------   BP 137/63 (BP Location: Left Arm)   Pulse 73   Temp 98 F (36.7 C) (Oral)   Resp 16   SpO2 98%   No acute events overnight. Vitals reviewed. Patient remains medically cleared.  Disposition is pending per Psychiatry/Behavioral Medicine team recommendations.    Lavonia Drafts, MD 02/17/19 [phone]
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
## 2019-02-18 ENCOUNTER — Other Ambulatory Visit: Payer: Self-pay

## 2019-02-18 LAB — BASIC METABOLIC PANEL
Anion gap: 12 (ref 5–15)
BUN: 19 mg/dL (ref 8–23)
CO2: 22 mmol/L (ref 22–32)
Calcium: 9.1 mg/dL (ref 8.9–10.3)
Chloride: 103 mmol/L (ref 98–111)
Creatinine, Ser: 0.7 mg/dL (ref 0.44–1.00)
GFR calc Af Amer: >60 mL/min (ref >60)
GFR calc non Af Amer: >60 mL/min (ref >60)
Glucose, Bld: 197 mg/dL — ABNORMAL HIGH (ref 70–99)
Potassium: 4.3 mmol/L (ref 3.5–5.1)
Sodium: 137 mmol/L (ref 135–145)

## 2019-02-18 LAB — CBC
HCT: 42 % (ref 36.0–46.0)
Hemoglobin: 13.7 g/dL (ref 12.0–15.0)
MCH: 30.6 pg (ref 26.0–34.0)
MCHC: 32.6 g/dL (ref 30.0–36.0)
MCV: 94 fL (ref 80.0–100.0)
Platelets: 287 10*3/uL (ref 150–400)
RBC: 4.47 MIL/uL (ref 3.87–5.11)
RDW: 13.1 % (ref 11.5–15.5)
WBC: 9.5 10*3/uL (ref 4.0–10.5)
nRBC: 0 % (ref 0.0–0.2)

## 2019-02-18 LAB — URINALYSIS, COMPLETE (UACMP) WITH MICROSCOPIC
Bacteria, UA: NONE SEEN
Bilirubin Urine: NEGATIVE
Glucose, UA: NEGATIVE mg/dL
Hgb urine dipstick: NEGATIVE
Ketones, ur: NEGATIVE mg/dL
Leukocytes,Ua: NEGATIVE
Nitrite: NEGATIVE
Protein, ur: NEGATIVE mg/dL
Specific Gravity, Urine: 1.02 (ref 1.005–1.030)
pH: 5 (ref 5.0–8.0)

## 2019-02-18 MED ORDER — LOPERAMIDE HCL 2 MG PO CAPS: 2.0000 mg | ORAL_CAPSULE | ORAL | Status: DC | PRN

## 2019-02-18 NOTE — Care Management (Addendum)
TOC RN CM: Incoming call from [facility]  Requesting callback to [phone] with responsible party contact information. Also requesting fax be sent with latest nursing and MD notes.   RN CM faxed requested information to [facility].

## 2019-02-18 NOTE — ED Notes (Signed)
Pt provided with meal tray. Pt took chicken off of meal tray and grape juice and asked this tech to throw the rest away.

## 2019-02-18 NOTE — ED Notes (Signed)
Resumed care from samantha rn.  Pt alert.  Resting quietly.  Pt waiting on placement.

## 2019-02-18 NOTE — ED Notes (Signed)
Report off to sarah rn  

## 2019-02-18 NOTE — ED Notes (Signed)
Patient with greenish drainage all over front of gown, she states that she vomited, Nurse changed gown, cleaned her chest area , gave her a extra blanket, held her medications due to n/v, will continue to monitor, q 15 minute checks for safety.

## 2019-02-18 NOTE — ED Notes (Signed)
Pt checked and dry at this time. 

## 2019-02-18 NOTE — ED Notes (Signed)
Pt cleansed of moderate amount of dark brown liquid stool. While turning pt to put on new attends pt passed another large amount of stool again. Dr Owens Shark informed.

## 2019-02-18 NOTE — ED Notes (Signed)
VOL  PENDING  PLACEMENT 

## 2019-02-18 NOTE — Progress Notes (Addendum)
Physical Therapy Treatment Patient Details Name: Becky Stout MRN: [id number] DOB: December 20, 1955 Today's Date: 02/18/2019    History of Present Illness Becky Stout is a 64 y.o. female  s/p UTI, scattered embolic CVAs and confusion with a fall. PMHx: B12 deficiency, type 2 diabetes not on treatment, hypertension, obesity, WPW syndrome. Pt DC to home after extensive admission at [place], pt became weak, emetic, altered, and less mobile, refusing [place] services set up for her.    PT Comments    Pt in bed upon entry, lights up, watching TV, pt in same low bed that was not functioning properly last week. Pt agreeable to PT session, has no memory of author despite working with him twice last week. Pt agreeable to sit EOB for exercises, then performs supine exercises as well. Pt requires cues to attend to task and encouragement to continue in PT session, but she is fixated on return to supine, asking calmly after each exercise. Pt declines transfers training, still reporting concerns of falling and continued weakness. Pt asked if she desires to AMB again one day, she reports unsure as she knows how difficult it will be, but she knows her sister and mother desire this of her. Pt seems to have limited insight into how her refusal to attempt OOB each day continues to abet a decline in strength. RN asked if patient had been OOB this weekend, but she reports she is not aware. RN made aware of malfunctioning bed controls, author concerned that pt essentially unable to sit-up, she reports she is aware already. Pt should be getting OOB daily with nursing, but this does not appear to be happening.    Follow Up Recommendations  SNF;Supervision for mobility/OOB;Supervision/Assistance - 24 hour     Equipment Recommendations  None recommended by PT    Recommendations for Other Services       Precautions / Restrictions Precautions Precautions: Fall Restrictions Weight Bearing Restrictions: No    Mobility   Bed Mobility   Bed Mobility: Supine to Sit;Sit to Supine     Supine to sit: Modified independent (Device/Increase time);Supervision Sit to supine: Supervision;Modified independent (Device/Increase time)   General bed mobility comments: moving with improved ease this date, extensive cues for sitting in a way conducive to easy trunk support.  Transfers Overall transfer level: (Pt refuses, reports too weak, fearful of falling)                  Ambulation/Gait                 Stairs             Wheelchair Mobility    Modified Rankin (Stroke Patients Only)       Balance                                            Cognition Arousal/Alertness: Awake/alert Behavior During Therapy: WFL for tasks assessed/performed;Restless Overall Cognitive Status: History of cognitive impairments - at baseline Area of Impairment: Safety/judgement;Problem solving                   Current Attention Level: Alternating   Following Commands: Follows one step commands with increased time Safety/Judgement: Decreased awareness of deficits Awareness: Anticipatory;Emergent Problem Solving: Slow processing;Decreased initiation General Comments: Poor insight of situation despite full orientation; perseverates, has flat affect, easily distrated, motivation is poor,  and pt is anxious about falling.      Exercises General Exercises - Upper Extremity Shoulder Flexion: AROM;Both;Seated;Limitations;10 reps Shoulder Flexion Limitations: seated BUE hand to head General Exercises - Lower Extremity Long Arc Quad: Strengthening;Both;Seated;15 reps;Limitations;AAROM Long Arc Quad Limitations: does not have strength for rull range. Heel Slides: AROM;Both;Supine;15 reps Hip ABduction/ADduction: Standing;AROM;Strengthening;Both;15 reps Straight Leg Raises: Both;10 reps;Supine;AROM Hip Flexion/Marching: AROM;Strengthening;Both;Seated;15 reps Other Exercises Other  Exercises: single UE overhead reach c paired cervical extension/upward gaze 1x10 Bilat Other Exercises: Hooklying bridging 1x10    General Comments        Pertinent Vitals/Pain Pain Assessment: No/denies pain    Home Living                      Prior Function            PT Goals (current goals can now be found in the care plan section) Acute Rehab PT Goals Patient Stated Goal: to go home PT Goal Formulation: With patient Time For Goal Achievement: 02/23/19 Potential to Achieve Goals: Poor Progress towards PT goals: Not progressing toward goals - comment    Frequency    Min 2X/week      PT Plan Current plan remains appropriate    Co-evaluation              AM-PAC PT "6 Clicks" Mobility   Outcome Measure  Help needed turning from your back to your side while in a flat bed without using bedrails?: A [place] Help needed moving from lying on your back to sitting on the side of a flat bed without using bedrails?: A [place] Help needed moving to and from a bed to a chair (including a wheelchair)?: A Lot Help needed standing up from a chair using your arms (e.g., wheelchair or bedside chair)?: A Lot Help needed to walk in hospital room?: Total Help needed climbing 3-5 steps with a railing? : Total 6 Click Score: 12    End of Session   Activity Tolerance: Patient tolerated treatment well;Patient limited by fatigue Patient left: in bed Nurse Communication: Mobility status PT Visit Diagnosis: Other abnormalities of gait and mobility (R26.89);Unsteadiness on feet (R26.81);Difficulty in walking, not elsewhere classified (R26.2);Muscle weakness (generalized) (M62.81)     Time: 1001-1016 PT Time Calculation (min) (ACUTE ONLY): 15 min  Charges:  $Therapeutic Exercise: 8-22 mins                     12:24 PM, 02/18/19 Etta Grandchild, PT, DPT Physical Therapist - [facility]  [phone] ([place])      Jesse Nosbisch  C 02/18/2019, 12:18 PM

## 2019-02-18 NOTE — ED Notes (Addendum)
Pt not hungry and did not eat dinner tray.

## 2019-02-18 NOTE — ED Notes (Signed)
Pt moved into new room. Pt given remote for TV. Pt refusing to let me change her gown or sheets that looked soiled. Pt did allow me to change her pillow case. Pt resting comfortably at this time. Pt given call bell.

## 2019-02-18 NOTE — ED Provider Notes (Signed)
-----------------------------------------   10:04 AM on 02/18/2019 -----------------------------------------  Blood pressure (!) 114/47, pulse 72, temperature 98.3 F (36.8 C), temperature source Oral, resp. rate 18, SpO2 95 %.  The patient is calm and cooperative at this time.  There have been no acute events since the last update.    Patient has been cleared by psychiatry service and we are awaiting further recommendations by social work.  Lab work was rechecked and within normal limits, potassium improved.  Urinalysis also unremarkable.  Given ongoing diarrhea, will order loperamide for use as needed.  Patient remains pending placement by social work.   Blake Divine, MD 02/18/19 1444
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
## 2019-02-18 NOTE — ED Notes (Signed)
Pt incontinent of liquid stool. Pt cleaned and barrier cream placed on pt bottom. Pt agreed to blood draw. This tech obtained blood from pt and sent it to the lab.

## 2019-02-18 NOTE — ED Notes (Signed)
Med given  Pt alert   Watching tv.

## 2019-02-19 NOTE — ED Notes (Signed)
Obtained temperature reading for pt to complete VS. Informed pt her sister Morey Hummingbird called and she was pleased to her that.

## 2019-02-19 NOTE — ED Notes (Signed)
Pt provided w/ dinner tray

## 2019-02-19 NOTE — Social Work (Addendum)
TOC SW:  Spoke with Cecille Rubin (patient's sister) and informed her about [place] decision.  Cecille Rubin stated that she was aware of [facility] decision.  Cecille Rubin was given contact information for [facility], and has reached out to them for bed availability.  This social worker asked Cecille Rubin if she would be willing to consider facilities in [place], and she said yes. Family supportive and active in helping get patient setttled in facility. This Education officer, museum will send [place], to facilities in [place].  Social Worker sent [employer] to facilities in [place], [place], [place] and [place].

## 2019-02-19 NOTE — NC FL2 (Signed)
[place] LEVEL OF CARE SCREENING TOOL     IDENTIFICATION  Patient Name: Becky Stout Birthdate: 1956/01/07 Sex: female Admission Date (Current Location): 02/08/2019  [facility] and [place] Number:  Engineering geologist and Address:  [facility], [address], [place], [place] [address]      Provider Number:    Attending Physician Name and Address:  No att. providers found  Relative Name and Phone Number:       Current Level of Care: Hospital Recommended Level of Care: Memory Care, Other (Comment)(Long Term Care) Prior Approval Number:    Date Approved/Denied:   PASRR Number: [id number]  Discharge Plan: SNF    Current Diagnoses: Patient Active Problem List   Diagnosis Date Noted  . Acute cystitis without hematuria   . Cerebral thrombosis with cerebral infarction 11/04/2018  . Failure to thrive in adult 11/03/2018  . Acute lower UTI 11/03/2018  . High anion gap metabolic acidosis [id number]  . DKA (diabetic ketoacidoses) ([place]) 11/03/2018  . Anxiety 09/15/2015  . Obesity 05/22/2015  . Hyponatremia 01/10/2014  . Facial rash 06/18/2013  . Joint pain 06/18/2013  . Frequent urination 12/21/2012  . Colon cancer screening 04/27/2012  . Routine general medical examination at a health care facility 04/10/2011  . Other screening mammogram 04/08/2011  . Gluten intolerance 04/08/2011  . Hyperlipidemia 03/24/2010  . Poorly controlled type 2 diabetes mellitus ([place]) 03/24/2010  . MAMMOGRAM, ABNORMAL 03/24/2010  . Tachycardia 03/14/2008  . B12 deficiency 11/12/2007  . Hypothyroidism 11/06/2007  . Essential hypertension 10/26/2007  . WOLFF (WOLFE)-PARKINSON-WHITE (WPW) SYNDROME 04/30/2007  . SUPRAVENTRICULAR TACHYCARDIA 05/18/2006    Orientation RESPIRATION BLADDER Height & Weight     Self, Time(Intermittent confusion)  Normal Continent(times of incontinence: wears adult diapers) Weight: 218 lb 4.1 oz (99 kg) Height:  5\' 4"   (162.6 cm)  BEHAVIORAL SYMPTOMS/MOOD NEUROLOGICAL BOWEL NUTRITION STATUS      Continent(intermittent incontinence: wears adult diapers) Diet  AMBULATORY STATUS COMMUNICATION OF NEEDS Skin   Extensive Assist Verbally Normal                       Personal Care Assistance Level of Assistance  Bathing, Feeding, Dressing Bathing Assistance: Limited assistance Feeding assistance: Limited assistance Dressing Assistance: Limited assistance     Functional Limitations Info    Sight Info: Adequate Hearing Info: Adequate Speech Info: Adequate    SPECIAL CARE FACTORS FREQUENCY  PT (By licensed PT), OT (By licensed OT)     PT Frequency: min 5xweekly OT Frequency: min 5xweekly            Contractures      Additional Factors Info                  Current Medications (02/19/2019):  This is the current hospital active medication list Current Facility-Administered Medications  Medication Dose Route Frequency Provider Last Rate Last Admin  . amLODipine (NORVASC) tablet 10 mg  10 mg Oral Daily Vanessa [place], MD   10 mg at 02/19/19 0915  . atorvastatin (LIPITOR) tablet 80 mg  80 mg Oral q1800 Vanessa Anderson, MD   80 mg at 02/18/19 1710  . carvedilol (COREG) tablet 6.25 mg  6.25 mg Oral BID WC Vanessa Custer, MD   6.25 mg at 02/19/19 0913  . cholecalciferol (VITAMIN D3) tablet 1,000 Units  1,000 Units Oral Daily Vanessa [place], MD   1,000 Units at 02/19/19 0915  . clopidogrel (PLAVIX)  tablet 75 mg  75 mg Oral Daily Vanessa Piggott, MD   75 mg at 02/19/19 [id number]  . linagliptin (TRADJENTA) tablet 5 mg  5 mg Oral Daily Vanessa Blunt, MD   5 mg at 02/19/19 0912  . lisinopril (ZESTRIL) tablet 40 mg  40 mg Oral Daily Vanessa [place], MD   40 mg at 02/19/19 0913  . loperamide (IMODIUM) capsule 2 mg  2 mg Oral PRN Blake Divine, MD      . metFORMIN (GLUCOPHAGE) tablet 850 mg  850 mg Oral BID WC Vanessa Herrick, MD   850 mg at 02/19/19 0912  . pantoprazole (PROTONIX) EC tablet 40 mg  40 mg Oral  Daily Vanessa [place], MD   40 mg at 02/19/19 0915  . sertraline (ZOLOFT) tablet 100 mg  100 mg Oral Daily Vanessa Corson, MD   100 mg at 02/19/19 [id number]  . sodium chloride 0.9 % bolus 1,000 mL  1,000 mL Intravenous Once Gregor Hams, MD      . thiamine tablet 100 mg  100 mg Oral Daily Vanessa Wineglass, MD   100 mg at 02/19/19 [id number]   Current Outpatient Medications  Medication Sig Dispense Refill  . amLODipine (NORVASC) 10 MG tablet Take 1 tablet (10 mg total) by mouth daily. 30 tablet 2  . atorvastatin (LIPITOR) 80 MG tablet Take 1 tablet (80 mg total) by mouth daily at 6 PM. 30 tablet 2  . carvedilol (COREG) 6.25 MG tablet Take 1 tablet (6.25 mg total) by mouth 2 (two) times daily with a meal. 30 tablet 2  . cholecalciferol (VITAMIN D3) 25 MCG (1000 UT) tablet Take 1 tablet (1,000 Units total) by mouth daily. 30 tablet 2  . clopidogrel (PLAVIX) 75 MG tablet Take 1 tablet (75 mg total) by mouth daily. 30 tablet 2  . cyanocobalamin 1000 MCG tablet Take 1 tablet (1,000 mcg total) by mouth daily. 30 tablet 2  . linagliptin (TRADJENTA) 5 MG TABS tablet Take 1 tablet (5 mg total) by mouth daily. 30 tablet 2  . lisinopril (ZESTRIL) 40 MG tablet Take 1 tablet (40 mg total) by mouth daily. 30 tablet 2  . metFORMIN (GLUCOPHAGE) 850 MG tablet Take 1 tablet (850 mg total) by mouth 2 (two) times daily with a meal. 60 tablet 2  . omeprazole (PRILOSEC) 20 MG capsule Take 20 mg by mouth daily.    . sertraline (ZOLOFT) 100 MG tablet Take 1 tablet (100 mg total) by mouth daily. 30 tablet 2  . thiamine 100 MG tablet Take 1 tablet (100 mg total) by mouth daily. 30 tablet 0     Discharge Medications: Please see discharge summary for a list of discharge medications.  Relevant Imaging Results:  Relevant Lab Results:   Additional Information Private Pay [phone]  Adelene Amas, [place]

## 2019-02-19 NOTE — ED Notes (Signed)
Pt provided w/ lunch tray and grape juice.

## 2019-02-19 NOTE — ED Notes (Signed)
Pt resting in bed. Respirations are equal and unlabored, no signs of acute distress.

## 2019-02-19 NOTE — ED Notes (Signed)
Patient given breakfast tray. Patient reports she is not hungry at this time but tray left at bedside incase she changes her mind

## 2019-02-19 NOTE — NC FL2 (Signed)
[place] LEVEL OF CARE SCREENING TOOL     IDENTIFICATION  Patient Name: Becky Stout Birthdate: 1955/12/04 Sex: female Admission Date (Current Location): 02/08/2019  [facility] and [place] Number:  Engineering geologist and Address:         Provider Number:    Attending Physician Name and Address:  No att. providers found  Relative Name and Phone Number:       Current Level of Care: Hospital Recommended Level of Care: [place] Prior Approval Number:    Date Approved/Denied:   PASRR Number: [id number]  Discharge Plan: SNF    Current Diagnoses: Patient Active Problem List   Diagnosis Date Noted  . Acute cystitis without hematuria   . Cerebral thrombosis with cerebral infarction 11/04/2018  . Failure to thrive in adult 11/03/2018  . Acute lower UTI 11/03/2018  . High anion gap metabolic acidosis [id number]  . DKA (diabetic ketoacidoses) ([place]) 11/03/2018  . Anxiety 09/15/2015  . Obesity 05/22/2015  . Hyponatremia 01/10/2014  . Facial rash 06/18/2013  . Joint pain 06/18/2013  . Frequent urination 12/21/2012  . Colon cancer screening 04/27/2012  . Routine general medical examination at a health care facility 04/10/2011  . Other screening mammogram 04/08/2011  . Gluten intolerance 04/08/2011  . Hyperlipidemia 03/24/2010  . Poorly controlled type 2 diabetes mellitus ([place]) 03/24/2010  . MAMMOGRAM, ABNORMAL 03/24/2010  . Tachycardia 03/14/2008  . B12 deficiency 11/12/2007  . Hypothyroidism 11/06/2007  . Essential hypertension 10/26/2007  . WOLFF (WOLFE)-PARKINSON-WHITE (WPW) SYNDROME 04/30/2007  . SUPRAVENTRICULAR TACHYCARDIA 05/18/2006    Orientation RESPIRATION BLADDER Height & Weight     Self, Time, Situation, Place  Normal Continent(occasional episodes of incontinence) Weight: 218 lb 4.1 oz (99 kg) Height:  5\' 4"  (162.6 cm)  BEHAVIORAL SYMPTOMS/MOOD NEUROLOGICAL BOWEL NUTRITION STATUS      Continent Diet  AMBULATORY STATUS  COMMUNICATION OF NEEDS Skin   Extensive Assist Verbally Normal                       Personal Care Assistance Level of Assistance  Bathing, Feeding, Dressing Bathing Assistance: Limited assistance Feeding assistance: Limited assistance Dressing Assistance: Limited assistance     Functional Limitations Info    Sight Info: Adequate Hearing Info: Adequate Speech Info: Adequate    SPECIAL CARE FACTORS FREQUENCY  PT (By licensed PT), OT (By licensed OT)     PT Frequency: min 5xweekly OT Frequency: min 5xweekly            Contractures      Additional Factors Info                  Current Medications (02/19/2019):  This is the current hospital active medication list Current Facility-Administered Medications  Medication Dose Route Frequency Provider Last Rate Last Admin  . amLODipine (NORVASC) tablet 10 mg  10 mg Oral Daily Vanessa [place], MD   10 mg at 02/19/19 0915  . atorvastatin (LIPITOR) tablet 80 mg  80 mg Oral q1800 Vanessa Byron, MD   80 mg at 02/18/19 1710  . carvedilol (COREG) tablet 6.25 mg  6.25 mg Oral BID WC Vanessa Hughson, MD   6.25 mg at 02/19/19 0913  . cholecalciferol (VITAMIN D3) tablet 1,000 Units  1,000 Units Oral Daily Vanessa Mackey, MD   1,000 Units at 02/19/19 0915  . clopidogrel (PLAVIX) tablet 75 mg  75 mg Oral Daily Vanessa Ridg[place], MD   75 mg at  02/19/19 0914  . linagliptin (TRADJENTA) tablet 5 mg  5 mg Oral Daily Vanessa Raiford, MD   5 mg at 02/19/19 0912  . lisinopril (ZESTRIL) tablet 40 mg  40 mg Oral Daily Vanessa [place], MD   40 mg at 02/19/19 0913  . loperamide (IMODIUM) capsule 2 mg  2 mg Oral PRN Blake Divine, MD      . metFORMIN (GLUCOPHAGE) tablet 850 mg  850 mg Oral BID WC Vanessa [place], MD   850 mg at 02/19/19 0912  . pantoprazole (PROTONIX) EC tablet 40 mg  40 mg Oral Daily Vanessa Richardson, MD   40 mg at 02/19/19 0915  . sertraline (ZOLOFT) tablet 100 mg  100 mg Oral Daily Vanessa [place], MD   100 mg at 02/19/19 [id number]  . sodium  chloride 0.9 % bolus 1,000 mL  1,000 mL Intravenous Once Gregor Hams, MD      . thiamine tablet 100 mg  100 mg Oral Daily Vanessa , MD   100 mg at 02/19/19 [id number]   Current Outpatient Medications  Medication Sig Dispense Refill  . amLODipine (NORVASC) 10 MG tablet Take 1 tablet (10 mg total) by mouth daily. 30 tablet 2  . atorvastatin (LIPITOR) 80 MG tablet Take 1 tablet (80 mg total) by mouth daily at 6 PM. 30 tablet 2  . carvedilol (COREG) 6.25 MG tablet Take 1 tablet (6.25 mg total) by mouth 2 (two) times daily with a meal. 30 tablet 2  . cholecalciferol (VITAMIN D3) 25 MCG (1000 UT) tablet Take 1 tablet (1,000 Units total) by mouth daily. 30 tablet 2  . clopidogrel (PLAVIX) 75 MG tablet Take 1 tablet (75 mg total) by mouth daily. 30 tablet 2  . cyanocobalamin 1000 MCG tablet Take 1 tablet (1,000 mcg total) by mouth daily. 30 tablet 2  . linagliptin (TRADJENTA) 5 MG TABS tablet Take 1 tablet (5 mg total) by mouth daily. 30 tablet 2  . lisinopril (ZESTRIL) 40 MG tablet Take 1 tablet (40 mg total) by mouth daily. 30 tablet 2  . metFORMIN (GLUCOPHAGE) 850 MG tablet Take 1 tablet (850 mg total) by mouth 2 (two) times daily with a meal. 60 tablet 2  . omeprazole (PRILOSEC) 20 MG capsule Take 20 mg by mouth daily.    . sertraline (ZOLOFT) 100 MG tablet Take 1 tablet (100 mg total) by mouth daily. 30 tablet 2  . thiamine 100 MG tablet Take 1 tablet (100 mg total) by mouth daily. 30 tablet 0     Discharge Medications: Please see discharge summary for a list of discharge medications.  Relevant Imaging Results:  Relevant Lab Results:   Additional Information Private [phone]  Adelene Amas, [place]

## 2019-02-19 NOTE — ED Provider Notes (Signed)
-----------------------------------------   6:08 AM on 02/19/2019 -----------------------------------------   Blood pressure 134/75, pulse 80, temperature 98.7 F (37.1 C), temperature source Oral, resp. rate 18, height 5\' 4"  (1.626 m), weight 99 kg, SpO2 96 %.  The patient is sleeping at this time.  There have been no acute events since the last update.  Awaiting disposition plan from Behavioral Medicine and/or Social Work team(s).   Paulette Blanch, MD 02/19/19 [phone]

## 2019-02-19 NOTE — ED Notes (Signed)
Pt changed into a clean brief. 1 unmeasured urine. Nothing needed from staff at this time/ Pt denies pain

## 2019-02-19 NOTE — ED Notes (Signed)
ED tech Alma Friendly and new ED tech cleaned pt of stool and urine and applied barrier cream. Sheets were changed and pt was put in new brief.

## 2019-02-19 NOTE — Social Work (Signed)
TOC SW: Spoke with Almyra Free at [facility] to check on status for patient.  Almyra Free informed this social worker that because Ms. Cieslak will only be able to have private pay for 6-12 months and will need Medicaid transfer, they will not be able to take her.  Almyra Free stated she spoke with Ms. Eddie Dibbles sister and she advised her to contact [facility] which will be able to take private pay to [facility].  This Education officer, museum will contact Ms. Casique's family to figure out next step and [facility] for bed availability.

## 2019-02-19 NOTE — ED Notes (Signed)
Pt's sister Jim Desanctis called and I provided an update for her.

## 2019-02-19 NOTE — NC FL2 (Signed)
[place] LEVEL OF CARE SCREENING TOOL     IDENTIFICATION  Patient Name: Becky Stout Birthdate: 1955/04/20 Sex: female Admission Date (Current Location): 02/08/2019  [facility] and [place] Number:  Engineering geologist and Address:  [facility], [address] [place], [place] [address]      Provider Number:    Attending Physician Name and Address:  No att. providers found  Relative Name and Phone Number:       Current Level of Care: Hospital Recommended Level of Care: Memory Care, Other (Comment)(Long Term Care) Prior Approval Number:    Date Approved/Denied:   PASRR Number: [id number]  Discharge Plan: SNF    Current Diagnoses: Patient Active Problem List   Diagnosis Date Noted  . Acute cystitis without hematuria   . Cerebral thrombosis with cerebral infarction 11/04/2018  . Failure to thrive in adult 11/03/2018  . Acute lower UTI 11/03/2018  . High anion gap metabolic acidosis [id number]  . DKA (diabetic ketoacidoses) ([place]) 11/03/2018  . Anxiety 09/15/2015  . Obesity 05/22/2015  . Hyponatremia 01/10/2014  . Facial rash 06/18/2013  . Joint pain 06/18/2013  . Frequent urination 12/21/2012  . Colon cancer screening 04/27/2012  . Routine general medical examination at a health care facility 04/10/2011  . Other screening mammogram 04/08/2011  . Gluten intolerance 04/08/2011  . Hyperlipidemia 03/24/2010  . Poorly controlled type 2 diabetes mellitus ([place]) 03/24/2010  . MAMMOGRAM, ABNORMAL 03/24/2010  . Tachycardia 03/14/2008  . B12 deficiency 11/12/2007  . Hypothyroidism 11/06/2007  . Essential hypertension 10/26/2007  . WOLFF (WOLFE)-PARKINSON-WHITE (WPW) SYNDROME 04/30/2007  . SUPRAVENTRICULAR TACHYCARDIA 05/18/2006    Orientation RESPIRATION BLADDER Height & Weight     Self, Time(Intermittent confusion)  Normal Continent(times of incontinence: wears adult diapers) Weight: 218 lb 4.1 oz (99 kg) Height:  5\' 4"   (162.6 cm)  BEHAVIORAL SYMPTOMS/MOOD NEUROLOGICAL BOWEL NUTRITION STATUS      Continent(intermittent incontinence: wears adult diapers) Diet  AMBULATORY STATUS COMMUNICATION OF NEEDS Skin   Extensive Assist Verbally Normal                       Personal Care Assistance Level of Assistance  Bathing, Feeding, Dressing Bathing Assistance: Limited assistance Feeding assistance: Limited assistance Dressing Assistance: Limited assistance     Functional Limitations Info    Sight Info: Adequate Hearing Info: Adequate Speech Info: Adequate    SPECIAL CARE FACTORS FREQUENCY  PT (By licensed PT), OT (By licensed OT)     PT Frequency: min 5xweekly OT Frequency: min 5xweekly            Contractures      Additional Factors Info                  Current Medications (02/19/2019):  This is the current hospital active medication list Current Facility-Administered Medications  Medication Dose Route Frequency Provider Last Rate Last Admin  . amLODipine (NORVASC) tablet 10 mg  10 mg Oral Daily Vanessa Lakemore, MD   10 mg at 02/19/19 0915  . atorvastatin (LIPITOR) tablet 80 mg  80 mg Oral q1800 Vanessa [place], MD   80 mg at 02/18/19 1710  . carvedilol (COREG) tablet 6.25 mg  6.25 mg Oral BID WC Vanessa [place]erfield, MD   6.25 mg at 02/19/19 0913  . cholecalciferol (VITAMIN D3) tablet 1,000 Units  1,000 Units Oral Daily Vanessa [place], MD   1,000 Units at 02/19/19 0915  . clopidogrel (PLAVIX)  tablet 75 mg  75 mg Oral Daily Vanessa Quem[place], MD   75 mg at 02/19/19 [id number]  . linagliptin (TRADJENTA) tablet 5 mg  5 mg Oral Daily Vanessa [place], MD   5 mg at 02/19/19 0912  . lisinopril (ZESTRIL) tablet 40 mg  40 mg Oral Daily Vanessa [place], MD   40 mg at 02/19/19 0913  . loperamide (IMODIUM) capsule 2 mg  2 mg Oral PRN Blake Divine, MD      . metFORMIN (GLUCOPHAGE) tablet 850 mg  850 mg Oral BID WC Vanessa Descanso, MD   850 mg at 02/19/19 0912  . pantoprazole (PROTONIX) EC tablet 40 mg  40 mg Oral  Daily Vanessa [place], MD   40 mg at 02/19/19 0915  . sertraline (ZOLOFT) tablet 100 mg  100 mg Oral Daily Vanessa [place], MD   100 mg at 02/19/19 [id number]  . sodium chloride 0.9 % bolus 1,000 mL  1,000 mL Intravenous Once Gregor Hams, MD      . thiamine tablet 100 mg  100 mg Oral Daily Vanessa [place], MD   100 mg at 02/19/19 [id number]   Current Outpatient Medications  Medication Sig Dispense Refill  . amLODipine (NORVASC) 10 MG tablet Take 1 tablet (10 mg total) by mouth daily. 30 tablet 2  . atorvastatin (LIPITOR) 80 MG tablet Take 1 tablet (80 mg total) by mouth daily at 6 PM. 30 tablet 2  . carvedilol (COREG) 6.25 MG tablet Take 1 tablet (6.25 mg total) by mouth 2 (two) times daily with a meal. 30 tablet 2  . cholecalciferol (VITAMIN D3) 25 MCG (1000 UT) tablet Take 1 tablet (1,000 Units total) by mouth daily. 30 tablet 2  . clopidogrel (PLAVIX) 75 MG tablet Take 1 tablet (75 mg total) by mouth daily. 30 tablet 2  . cyanocobalamin 1000 MCG tablet Take 1 tablet (1,000 mcg total) by mouth daily. 30 tablet 2  . linagliptin (TRADJENTA) 5 MG TABS tablet Take 1 tablet (5 mg total) by mouth daily. 30 tablet 2  . lisinopril (ZESTRIL) 40 MG tablet Take 1 tablet (40 mg total) by mouth daily. 30 tablet 2  . metFORMIN (GLUCOPHAGE) 850 MG tablet Take 1 tablet (850 mg total) by mouth 2 (two) times daily with a meal. 60 tablet 2  . omeprazole (PRILOSEC) 20 MG capsule Take 20 mg by mouth daily.    . sertraline (ZOLOFT) 100 MG tablet Take 1 tablet (100 mg total) by mouth daily. 30 tablet 2  . thiamine 100 MG tablet Take 1 tablet (100 mg total) by mouth daily. 30 tablet 0     Discharge Medications: Please see discharge summary for a list of discharge medications.  Relevant Imaging Results:  Relevant Lab Results:   Additional Information Private Pay [phone]  Adelene Amas, [place]

## 2019-02-19 NOTE — Progress Notes (Signed)
Physical Therapy Treatment Patient Details Name: Becky Stout MRN: [id number] DOB: [birth year] Today's Date: 02/19/2019    History of Present Illness Becky Stout is a 64 y.o. female  s/p UTI, scattered embolic CVAs and confusion with a fall. PMHx: B12 deficiency, type 2 diabetes not on treatment, hypertension, obesity, WPW syndrome. Pt DC to home after extensive admission at [facility], pt became weak, emetic, altered, and less mobile, refusing [place] services set up for her.    PT Comments    Pt in room asleep upon entry, pt recently in a new room no longer in low bed, but now in ED gurney. This makes patient even more anxious about falling than previous days, pt outright refusing PT session initially, but eventually agreeable to try some supine leg exercises after which she is agreeable to go throughout the full set of supine exercises from previous day. Similar to baseline behavior, pt continues to require encouragement throughout, as she frequently and impulsively tries to bring session to an end confabulating some reasoning that is not reality-based. She refers to her new ED room as a new apartment. Pt interactive throughout, pleasant, responds well to encouragement and recognition.     Follow Up Recommendations  SNF;Supervision for mobility/OOB;Supervision/Assistance - 24 hour     Equipment Recommendations  None recommended by PT    Recommendations for Other Services       Precautions / Restrictions Precautions Precautions: Fall Precaution Comments: very particular but receptive with redirection Restrictions Weight Bearing Restrictions: No    Mobility  Bed Mobility Overal bed mobility: (Pt refuses bed mobility at this time; already had irrational falls anxiety while in low bed, but now that she is back in ED gurney, she is more anxious about falling than ever.)                Transfers                    Ambulation/Gait                 Stairs              Wheelchair Mobility    Modified Rankin (Stroke Patients Only)       Balance Overall balance assessment: History of Falls;Mild deficits observed, not formally tested;Modified Independent                                          Cognition Arousal/Alertness: Awake/alert Behavior During Therapy: WFL for tasks assessed/performed;Restless Overall Cognitive Status: History of cognitive impairments - at baseline                                 General Comments: Poor insight of situation despite full orientation; perseverates, has flat affect, easily distrated, motivation is poor, and pt is anxious about falling.      Exercises General Exercises - Lower Extremity Heel Slides: 10 reps;AROM;Both;Supine;Strengthening Hip ABduction/ADduction: AROM;Strengthening;Both;10 reps;Supine Straight Leg Raises: Both;10 reps;Supine;AROM;Strengthening Hip Flexion/Marching: AROM;Strengthening;Both;Supine;10 reps Other Exercises Other Exercises: Hooklying bridging 1x10    General Comments        Pertinent Vitals/Pain Pain Assessment: No/denies pain Pain Intervention(s): Monitored during session    Home Living                      Prior  Function            PT Goals (current goals can now be found in the care plan section) Acute Rehab PT Goals Patient Stated Goal: to go home PT Goal Formulation: With patient Time For Goal Achievement: 02/23/19 Potential to Achieve Goals: Poor Progress towards PT goals: Progressing toward goals    Frequency    Min 2X/week      PT Plan Current plan remains appropriate    Co-evaluation              AM-PAC PT "6 Clicks" Mobility   Outcome Measure  Help needed turning from your back to your side while in a flat bed without using bedrails?: A [place] Help needed moving from lying on your back to sitting on the side of a flat bed without using bedrails?: A [place] Help needed moving to and  from a bed to a chair (including a wheelchair)?: A Lot Help needed standing up from a chair using your arms (e.g., wheelchair or bedside chair)?: A Lot Help needed to walk in hospital room?: Total Help needed climbing 3-5 steps with a railing? : Total 6 Click Score: 12    End of Session   Activity Tolerance: Patient tolerated treatment well;Patient limited by fatigue Patient left: in bed;with call bell/phone within reach Nurse Communication: Mobility status PT Visit Diagnosis: Other abnormalities of gait and mobility (R26.89);Unsteadiness on feet (R26.81);Difficulty in walking, not elsewhere classified (R26.2);Muscle weakness (generalized) (M62.81)     Time: 1530-1540 PT Time Calculation (min) (ACUTE ONLY): 10 min  Charges:  $Therapeutic Activity: 8-22 mins                     4:08 PM, 02/19/19 Etta Grandchild, PT, DPT Physical Therapist - [facility]  [phone] ([place])    Peytin Dechert C 02/19/2019, 4:03 PM

## 2019-02-20 NOTE — ED Notes (Signed)
Pt provided lunch tray; declines at this time.

## 2019-02-20 NOTE — ED Notes (Signed)
Patient is checked for incontinence. Patient was dry and had not voided or had BM. Patient was returned to position of comfort. Call light in reach, lights dimmed

## 2019-02-20 NOTE — ED Notes (Signed)
VOL/Pending placement 

## 2019-02-20 NOTE — ED Notes (Signed)
Pt provided dinner tray; able to feed self. 

## 2019-02-20 NOTE — ED Provider Notes (Signed)
-----------------------------------------   4:01 AM on 02/20/2019 -----------------------------------------   Blood pressure 112/64, pulse 77, temperature 98.2 F (36.8 C), temperature source Oral, resp. rate 18, height 5\' 4"  (1.626 m), weight 99 kg, SpO2 98 %.  The patient had no acute events since last update.  Calm and cooperative at this time.  Disposition is pending per Psychiatry/Behavioral Medicine team recommendations.     Alfred Levins, [place], MD 02/20/19 [phone]

## 2019-02-20 NOTE — ED Notes (Signed)
Pt provided breakfast tray; able to feed self. 

## 2019-02-20 NOTE — ED Notes (Signed)
Patient was awoken by staff and assessed for need to be cleaned of incontinence. Patient has Purewick in place. On assessment patient is very dry in pubic region, purewick is removed at this time for relief. Patient had clean brief in place but new brief was placed for comfort. Lights dimmed and call light in reach

## 2019-02-20 NOTE — ED Notes (Signed)
Patient changed by this Probation officer and Arboriculturist

## 2019-02-21 LAB — GLUCOSE, CAPILLARY: Glucose-Capillary: 118 mg/dL — ABNORMAL HIGH (ref 70–99)

## 2019-02-21 NOTE — ED Notes (Signed)
Pt able to eat a small amount of chicken on dinner tray. Metformin will be gvien.

## 2019-02-21 NOTE — ED Notes (Signed)
Purewick applied along with a clean brief. Patient remained calm and cooperative during procedure. Room darkened per patient's request.

## 2019-02-21 NOTE — ED Notes (Signed)
Patient refused to have light on in room, raise head of bed or eat her lunch. Patient was encouraged to eat her lunch and offer was made to assist which the patient declined. Meal left at bedside in case patient changed her mind.

## 2019-02-21 NOTE — ED Notes (Signed)
Patient was changed by this Probation officer and ED techs. Patient had small runny BM. Patient cleaned and barrier cream placed.

## 2019-02-21 NOTE — Progress Notes (Signed)
PT Cancellation Note  Patient Details Name: Becky Stout MRN: [id number] DOB: [birth year]   Cancelled Treatment:    Reason Eval/Treat Not Completed: PT screened, no needs identified, will sign off. Pt has been participatory with PT over the past two weeks, but in a very limited capacity, apprehension regarding falls precluding meaningful interventions. Pt has not been especially motivated to return to AMB. Pt would find greater functional benefit with daily OOB with nursing, however pt has not been willing to OOB with PT. Given patient's lack of motivation and limited participation, pt warrants no skilled PT services at this time. Would encourage daily mobility with nursing as the patient will allow. PT signing off.   3:04 PM, 02/21/19 Etta Grandchild, PT, DPT Physical Therapist - [facility]  [phone] ([place])   [name] 02/21/2019, 2:54 PM

## 2019-02-21 NOTE — ED Provider Notes (Signed)
-----------------------------------------   4:36 AM on 02/21/2019 -----------------------------------------   Blood pressure 130/62, pulse 83, temperature 98.6 F (37 C), temperature source Oral, resp. rate 15, height 1.626 m (5\' 4" ), weight 99 kg, SpO2 99 %.  The patient is calm and cooperative at this time.  There have been no acute events since the last update.  Awaiting disposition plan from Behavioral Medicine and/or Social Work team(s).   Hinda Kehr, MD 02/21/19 [phone]

## 2019-02-21 NOTE — ED Notes (Signed)
Patient refused dinner meal. Patient was calm and cooperative. Patient states she will eat tomorrow. MD aware.

## 2019-02-22 NOTE — ED Notes (Signed)
Messaged [name] CSW for update on pt placement this morning. Informed still working on placement at this time.

## 2019-02-22 NOTE — ED Notes (Signed)
VOL/Pending Placement 

## 2019-02-22 NOTE — ED Notes (Signed)
Provided pt w/ lunch tray.

## 2019-02-22 NOTE — ED Notes (Signed)
Called and provided update to Pt's sister Virgia Land.

## 2019-02-22 NOTE — ED Notes (Signed)
Checked in on pt and found her to be comfortable in bed. Informed pt that her sister Morey Hummingbird called and she was pleased to that.

## 2019-02-22 NOTE — ED Notes (Signed)
VOL  PENDING  PLACEMENT 

## 2019-02-22 NOTE — ED Notes (Signed)
Pt lying in bed asleep (eyes closed with equal unlabored RR). Nothing needed from staff at this time

## 2019-02-22 NOTE — ED Notes (Signed)
Dinner tray provided for pt. Pt comfortable in bed watching TV.

## 2019-02-22 NOTE — ED Notes (Signed)
Pt was finished w/ lunch and I turned down her lights per request.

## 2019-02-22 NOTE — ED Notes (Signed)
Ed techs Kayla and Cassie cleaned pt of stool and urine. Barrier cream was applied and a new brief and gown were put on.

## 2019-02-22 NOTE — ED Notes (Signed)
This RN and Surveyor, minerals checked on pt and ensured she was dry and clean with purwick in place

## 2019-02-22 NOTE — ED Provider Notes (Signed)
1:22 AM 1/29  Blood pressure 132/72, pulse 72, temperature 98.6 F (37 C), temperature source Oral, resp. rate 18, height 5\' 4"  (1.626 m), weight 99 kg, SpO2 99 %.  The patient is calm and cooperative at this time.  There have been no acute events since the last update.  Awaiting disposition plan from Behavioral Medicine team.   Vanessa [place], MD 02/22/19 [phone]

## 2019-02-23 LAB — GLUCOSE, CAPILLARY: Glucose-Capillary: 165 mg/dL — ABNORMAL HIGH (ref 70–99)

## 2019-02-23 NOTE — ED Notes (Signed)
Upon entering pt room, pt is resting in bed. Respirations are equal and unlabored, no signs of acute distress.

## 2019-02-23 NOTE — ED Notes (Signed)
Pt given dinner tray and grape juice. States she is not hungry but will drink the grape juice.

## 2019-02-23 NOTE — ED Notes (Signed)
Patient awoken so that brief could be changed and skin cleansed. Patient with small amount of loose dark brown stool. Skin cleaned, barrier cream applied to buttocks and dry brief placed. Chux changed with a new one. Lights back out for patient comfort. Patient expressed her appreciation for warm blanket and all "that we did."

## 2019-02-23 NOTE — ED Notes (Signed)
Pt given lunch tray. Encouraged pt to sit up in bed and turn on lights. Pt refused stating she would "eat later".  Call bell within reach, stretcher locked in lowest position.

## 2019-02-23 NOTE — ED Notes (Signed)
Pt given meal tray and grape juice to drink. Pt denies any further needs at this time.

## 2019-02-23 NOTE — ED Notes (Signed)
Pt noted to have had a BM, pt changed and peri care provided. Gown and linnens changed.  Pt became very agitated with staff while being changed, began calling staff "idiots" and telling staff to "get your hands off me".  Call bell within reach, stretcher locked and in lowest position.

## 2019-02-23 NOTE — ED Notes (Signed)
Pt ate 40% of her lunch. Pt dry, toileting offered. Call bell within reach, stretcher locked in lowest position.

## 2019-02-23 NOTE — TOC Progression Note (Signed)
Transition of Care [facility]) - Progression Note    Patient Details  Name: Becky Stout MRN: [id number] Date of Birth: 1955-04-10  [facility]) CM/SW [place], LCSW Phone Number: 02/23/2019, 11:23 AM  Clinical Narrative:   TOC CM/SW team continue to wait for SNF bed offer.  SNF bed request extended.     Expected Discharge Plan: [place]    Expected Discharge Plan and Services Expected Discharge Plan: [place] arrangements for the past 2 months: Single Family Home                                       Social Determinants of Health (SDOH) Interventions    Readmission Risk Interventions No flowsheet data found.

## 2019-02-23 NOTE — ED Notes (Signed)
Pt resting comfortably at this time. Toileting offered, pt dry at this time. Call bell within reach, bed locked in lowest position.

## 2019-02-23 NOTE — ED Provider Notes (Signed)
-----------------------------------------   6:19 AM on 02/23/2019 -----------------------------------------   Blood pressure (!) 142/68, pulse 80, temperature 98.7 F (37.1 C), temperature source Oral, resp. rate 17, height 5\' 4"  (1.626 m), weight 99 kg, SpO2 98 %.  The patient is calm and cooperative at this time.  There have been no acute events since the last update.  Awaiting disposition plan from Social Work team(s).   Paulette Blanch, MD 02/23/19 [phone]

## 2019-02-23 NOTE — ED Notes (Signed)
Pt dry at this time, purewick in place.  Call bell within reach, stretcher locked in lowest position. Lights dimmed

## 2019-02-23 NOTE — ED Notes (Signed)
Patient is resting comfortably. Lights remain out in room for patient comfort. Will continue to round hourly.

## 2019-02-23 NOTE — ED Notes (Signed)
Pt dry at this time. Call bell within reach, stretcher locked in lowest position.

## 2019-02-23 NOTE — ED Notes (Signed)
Patient continues to sleep soundly. Will continue to monitor. LIghts are out in room for patient comfort.

## 2019-02-24 LAB — GLUCOSE, CAPILLARY: Glucose-Capillary: 159 mg/dL — ABNORMAL HIGH (ref 70–99)

## 2019-02-24 NOTE — ED Notes (Signed)
This RN and Melody, NT cleaned pt of stool and urine. Pt stated throughout cleaning her that she could not turn. Pt swatted at this RN and Melody multiple times throughout changing her and stated that she did not need to be changed. This RN and Melody were able to provide pericare, change brief, purewick, and replace sacral pad.

## 2019-02-24 NOTE — ED Notes (Signed)
Pt asleep, lunch tray placed on bedside table in rm.

## 2019-02-24 NOTE — ED Provider Notes (Signed)
-----------------------------------------   6:37 AM on 02/24/2019 -----------------------------------------   Blood pressure 116/60, pulse 73, temperature 98.5 F (36.9 C), temperature source Oral, resp. rate 18, height 5\' 4"  (1.626 m), weight 99 kg, SpO2 100 %.  The patient is sleeping at this time.  There have been no acute events since the last update.  Awaiting disposition plan from Behavioral Medicine and/or Social Work team(s).   Paulette Blanch, MD 02/24/19 [phone]

## 2019-02-24 NOTE — ED Notes (Signed)
Pt given grape juice. Pt denies any further needs at this time.

## 2019-02-25 NOTE — ED Provider Notes (Signed)
-----------------------------------------   5:16 AM on 02/25/2019 -----------------------------------------   Blood pressure (!) 110/50, pulse 75, temperature 98.5 F (36.9 C), temperature source Oral, resp. rate 15, height 1.626 m (5\' 4" ), weight 99 kg, SpO2 97 %.  The patient is calm and cooperative at this time.  There have been no acute events since the last update.  Awaiting disposition plan from Behavioral Medicine and/or Social Work team(s).   Hinda Kehr, MD 02/25/19 [phone]

## 2019-02-25 NOTE — ED Notes (Signed)
Pt lying in bed asleep, eyes closed with equal unlabored RR> nothing needed from staff at this time

## 2019-02-25 NOTE — ED Notes (Signed)
Pt lying in bed asleep, eyes closed with equal unlabored RR> nothing needed from staff at this time. environment secured with sitter and security nearby.

## 2019-02-25 NOTE — ED Notes (Signed)
Pt was cleaned and changed into a new brief by this RN and Hildred Alamin, NT. 1 unmeasured liquid stool, large, brown. Pt was placed into a new gown and lights were turned down to aide in comfort . Nothing needed from staff at this time

## 2019-02-25 NOTE — ED Notes (Addendum)
Pt brief and bed checked, pt is currently clean and dry at this time. Purewick canister emptied. Pt has no complaints at this time.

## 2019-02-25 NOTE — Social Work (Signed)
TOC SW:  Sent Fl2 and chart notes, to [facility], Judithann Sheen [phone] for possible bed availability.

## 2019-02-26 DIAGNOSIS — F0390 Unspecified dementia without behavioral disturbance: Secondary | ICD-10-CM

## 2019-02-26 HISTORY — DX: Unspecified dementia, unspecified severity, without behavioral disturbance, psychotic disturbance, mood disturbance, and anxiety: F03.90

## 2019-02-26 LAB — GLUCOSE, CAPILLARY: Glucose-Capillary: 162 mg/dL — ABNORMAL HIGH (ref 70–99)

## 2019-02-26 NOTE — ED Provider Notes (Signed)
-----------------------------------------   4:53 AM on 02/26/2019 -----------------------------------------   Blood pressure 117/68, pulse 73, temperature 98.5 F (36.9 C), temperature source Oral, resp. rate 18, height 5\' 4"  (1.626 m), weight 99 kg, SpO2 98 %.  The patient had no acute events since last update.  Calm and cooperative at this time.  Disposition is pending per Psychiatry/Behavioral Medicine team recommendations.     Alfred Levins, [place], MD 02/26/19 [phone]

## 2019-02-26 NOTE — ED Notes (Signed)
Provided pt w/ H20 to drink at bedside.

## 2019-02-26 NOTE — Social Work (Signed)
TOC SW: Spoke with Alvira Philips at [facility] [phone], will need to refax Fl2 info since their fax machine broke down yesterday.

## 2019-02-26 NOTE — ED Notes (Signed)
Pt has lunch at bedside.

## 2019-02-26 NOTE — ED Notes (Signed)
Pt provided dinner tray at bedside by Toney Sang

## 2019-02-26 NOTE — ED Notes (Signed)
Woke pt up to let her know I was going to be her nurse again today. Pt awoke easily. I told pt lunch should be coming soon.

## 2019-02-26 NOTE — Social Work (Signed)
TOC SW:  Spoke with patient's sister Morey Hummingbird [phone].  I informed her that I was informed by Alvira Philips at [facility], that the reason they are unable to accept the patient was because the patient does not have a diagnosis of dementia/Alzheimer's and for memory care the patient needs to have this diagnosis. RN [facility] reached out to MD, for questions related to patient diagnosis.   This SW explained to [place] even if there is a reevaluation and there is a change in diagnosis, patient does not have Medicaid or SS Disability and will still be considered private pay.  [place] explained to this SW patient has 28,000 in her 65K and they were hoping to use that for private pay.  This SW explained to [place] the amount of 28,000 would not cover private pay long-term memory care for more than a three/four months, depending on the facility and then the family would need to cover the costs, until Medicaid/SS Disability was approved.  Morey Hummingbird stated they were not able to cover for private pay expenses.  This SW advised that private care in the home may be a less expensive option and would also give them more time to prepare, file for Medicaid/SS Disability and get it approved.  This SW also advised Morey Hummingbird to consider becoming patient's legal guardian, POA and/or MPOA, to make it easier for future care decision since patient's capacity is diminished. Morey Hummingbird stated she needs to speak with her other sister Keane Police, about this and will contact this SW by Friday 2/5 with decision about patient's care.  This SW, reminded Morey Hummingbird this patient is not appropriate for inpatient and needs placement in appropriate setting for long-term needs.

## 2019-02-27 NOTE — ED Notes (Signed)
VOL  PENDING  PLACEMENT 

## 2019-02-27 NOTE — ED Notes (Signed)
Pt offered meal tray t this time. Pt st 'I will eat later". This Rn attempted to feed pt or coached pt to eat. Pt refuses this RN assistance. Pt drinking grape juice at this time.

## 2019-02-27 NOTE — ED Notes (Signed)
Offered pt meal tray but pt declined. Meal tray placed on bedside table in case pt changes her mind.

## 2019-02-27 NOTE — ED Provider Notes (Signed)
Patient continues to exhibit intermittent confusion, I suspect this is due to dementia   Lavonia Drafts, MD 02/27/19 1442

## 2019-02-27 NOTE — ED Notes (Signed)
Patient cleaned and brief changed

## 2019-02-27 NOTE — ED Notes (Signed)
This [facility] requesting missing medication due at 1700.

## 2019-02-27 NOTE — ED Notes (Signed)
Resumed care from yessica rn.  Pt sleeping.

## 2019-02-27 NOTE — ED Notes (Addendum)
Pt soiled in feces and urine. Pt cleaned by this RN and EDt Olivia Mackie. Pt instructed to roll in order for this RN to clean her up. Pt biting and hitting/swatting at this RN and at [place]. Pt insulting this Rn "you are an idiot" "do not touch me" This RN attempting to explained pt she had a BM/soiled brief and she need to be cleaned. Pt uncooperative a this time.

## 2019-02-27 NOTE — ED Notes (Signed)
Patient set up with breakfast tray 

## 2019-02-27 NOTE — Social Work (Signed)
TOC SW: Spoke to patient's sister Keane Police (lives in [place]).  Lorrie state "under no circumstances can she (the patient) have [private] home health, because she would have to live with my mother and it is very stressful for her to have her [the patient] there." I reminded Lorrie the patient is not appropriate for inpatient and she'll need to go to a facility.  Lorrie stated "I thought you were looking to find a facility with private pay", this SW informed Keane Police we've been actively looking for payment with private pay but there are few beds available but we will continue trying.

## 2019-02-27 NOTE — ED Notes (Signed)
This RN and  EDT Tracey assistance able to roll pt on her left side in order to provide peri care and change incontinence pad.

## 2019-02-28 LAB — GLUCOSE, CAPILLARY: Glucose-Capillary: 151 mg/dL — ABNORMAL HIGH (ref 70–99)

## 2019-02-28 NOTE — ED Notes (Signed)
Patient did eat banana from breakfast tray and bites from lunch tray. Patient asked for and received a cup of grape juice. Patient was informed that a tech and this Probation officer will be in soon to change her brief. Patient declined. Will continue to monitor.

## 2019-02-28 NOTE — ED Notes (Signed)
Pt sleeping. 

## 2019-02-28 NOTE — ED Notes (Addendum)
Pt had soiled brief. Sonja RN an Environmental manager cleaned and changed brief and chuck. Pt wanted to roll herself and when tech would try and help with holding of her hip she became angry and would swat at tech. Pt tried swatting/hitting tech twice in the matter of changing her brief saying "youre hurting me". Tech was clearly just laying hands on pts hip.   pts vitals taken by RN  pts room tided up.  lw edt

## 2019-02-28 NOTE — ED Provider Notes (Signed)
-----------------------------------------   5:25 AM on 02/28/2019 -----------------------------------------   Blood pressure 124/62, pulse 75, temperature 98.2 F (36.8 C), temperature source Oral, resp. rate 18, height 5\' 4"  (1.626 m), weight 99 kg, SpO2 98 %.  The patient had no acute events since last update.  Calm and cooperative at this time.  Disposition is pending per Psychiatry/Behavioral Medicine team recommendations.     Alfred Levins, [place], MD 02/28/19 [phone]

## 2019-02-28 NOTE — NC FL2 (Addendum)
[place] LEVEL OF CARE SCREENING TOOL     IDENTIFICATION  Patient Name: Becky Stout Birthdate: 06-Aug-1955 Sex: female Admission Date (Current Location): 02/08/2019  [facility] and [place] Number:  Engineering geologist and Address:  [facility], [address], [place], [place] [address]      Provider Number:    Attending Physician Name and Address:  No att. providers found  Relative Name and Phone Number:       Current Level of Care: Hospital Recommended Level of Care: Memory Care, Other (Comment)(Long Term Care) Prior Approval Number:    Date Approved/Denied:   PASRR Number: [id number]  Discharge Plan: SNF    Current Diagnoses: DEMENTIAWOUTBHVDIST - Dementia without behavioral disturbance, unspecified dementia type [facility]) Patient Active Problem List   Diagnosis Date Noted  . Acute cystitis without hematuria   . Cerebral thrombosis with cerebral infarction 11/04/2018  . Failure to thrive in adult 11/03/2018  . Acute lower UTI 11/03/2018  . High anion gap metabolic acidosis [id number]  . DKA (diabetic ketoacidoses) ([place]) 11/03/2018  . Anxiety 09/15/2015  . Obesity 05/22/2015  . Hyponatremia 01/10/2014  . Facial rash 06/18/2013  . Joint pain 06/18/2013  . Frequent urination 12/21/2012  . Colon cancer screening 04/27/2012  . Routine general medical examination at a health care facility 04/10/2011  . Other screening mammogram 04/08/2011  . Gluten intolerance 04/08/2011  . Hyperlipidemia 03/24/2010  . Poorly controlled type 2 diabetes mellitus ([place]) 03/24/2010  . MAMMOGRAM, ABNORMAL 03/24/2010  . Tachycardia 03/14/2008  . B12 deficiency 11/12/2007  . Hypothyroidism 11/06/2007  . Essential hypertension 10/26/2007  . WOLFF (WOLFE)-PARKINSON-WHITE (WPW) SYNDROME 04/30/2007  . SUPRAVENTRICULAR TACHYCARDIA 05/18/2006    Orientation RESPIRATION BLADDER Height & Weight     Self, Time(Intermittent confusion)  Normal  Continent(times of incontinence: wears adult diapers) Weight: 218 lb 4.1 oz (99 kg) Height:  5\' 4"  (162.6 cm)  BEHAVIORAL SYMPTOMS/MOOD NEUROLOGICAL BOWEL NUTRITION STATUS      Continent(intermittent incontinence: wears adult diapers) Diet  AMBULATORY STATUS COMMUNICATION OF NEEDS Skin   Extensive Assist Verbally Normal                       Personal Care Assistance Level of Assistance  Bathing, Feeding, Dressing Bathing Assistance: Limited assistance Feeding assistance: Limited assistance Dressing Assistance: Limited assistance     Functional Limitations Info    Sight Info: Adequate Hearing Info: Adequate Speech Info: Adequate    SPECIAL CARE FACTORS FREQUENCY  PT (By licensed PT), OT (By licensed OT)     PT Frequency: min 5xweekly OT Frequency: min 5xweekly            Contractures      Additional Factors Info                  Current Medications (02/28/2019):  This is the current hospital active medication list Current Facility-Administered Medications  Medication Dose Route Frequency Provider Last Rate Last Admin  . amLODipine (NORVASC) tablet 10 mg  10 mg Oral Daily Vanessa [place], MD   10 mg at 02/28/19 0955  . atorvastatin (LIPITOR) tablet 80 mg  80 mg Oral q1800 Vanessa Comal, MD   80 mg at 02/27/19 1743  . carvedilol (COREG) tablet 6.25 mg  6.25 mg Oral BID WC Vanessa Lancaster, MD   6.25 mg at 02/28/19 0802  . cholecalciferol (VITAMIN D3) tablet 1,000 Units  1,000 Units Oral Daily Vanessa [place], MD  1,000 Units at 02/28/19 0955  . clopidogrel (PLAVIX) tablet 75 mg  75 mg Oral Daily Vanessa [place], MD   75 mg at 02/28/19 0955  . linagliptin (TRADJENTA) tablet 5 mg  5 mg Oral Daily Vanessa Eudora, MD   5 mg at 02/28/19 0955  . lisinopril (ZESTRIL) tablet 40 mg  40 mg Oral Daily Vanessa [place], MD   Stopped at 02/28/19 1002  . loperamide (IMODIUM) capsule 2 mg  2 mg Oral PRN Blake Divine, MD      . metFORMIN (GLUCOPHAGE) tablet 850 mg  850 mg Oral BID WC  Vanessa [place], MD   850 mg at 02/28/19 0802  . pantoprazole (PROTONIX) EC tablet 40 mg  40 mg Oral Daily Vanessa Mackey, MD   40 mg at 02/28/19 0955  . sertraline (ZOLOFT) tablet 100 mg  100 mg Oral Daily Vanessa [place], MD   100 mg at 02/28/19 0955  . sodium chloride 0.9 % bolus 1,000 mL  1,000 mL Intravenous Once Gregor Hams, MD      . thiamine tablet 100 mg  100 mg Oral Daily Vanessa [place], MD   100 mg at 02/28/19 [id number]   Current Outpatient Medications  Medication Sig Dispense Refill  . amLODipine (NORVASC) 10 MG tablet Take 1 tablet (10 mg total) by mouth daily. 30 tablet 2  . atorvastatin (LIPITOR) 80 MG tablet Take 1 tablet (80 mg total) by mouth daily at 6 PM. 30 tablet 2  . carvedilol (COREG) 6.25 MG tablet Take 1 tablet (6.25 mg total) by mouth 2 (two) times daily with a meal. 30 tablet 2  . cholecalciferol (VITAMIN D3) 25 MCG (1000 UT) tablet Take 1 tablet (1,000 Units total) by mouth daily. 30 tablet 2  . clopidogrel (PLAVIX) 75 MG tablet Take 1 tablet (75 mg total) by mouth daily. 30 tablet 2  . cyanocobalamin 1000 MCG tablet Take 1 tablet (1,000 mcg total) by mouth daily. 30 tablet 2  . linagliptin (TRADJENTA) 5 MG TABS tablet Take 1 tablet (5 mg total) by mouth daily. 30 tablet 2  . lisinopril (ZESTRIL) 40 MG tablet Take 1 tablet (40 mg total) by mouth daily. 30 tablet 2  . metFORMIN (GLUCOPHAGE) 850 MG tablet Take 1 tablet (850 mg total) by mouth 2 (two) times daily with a meal. 60 tablet 2  . omeprazole (PRILOSEC) 20 MG capsule Take 20 mg by mouth daily.    . sertraline (ZOLOFT) 100 MG tablet Take 1 tablet (100 mg total) by mouth daily. 30 tablet 2  . thiamine 100 MG tablet Take 1 tablet (100 mg total) by mouth daily. 30 tablet 0     Discharge Medications: Please see discharge summary for a list of discharge medications.  Relevant Imaging Results:  Relevant Lab Results:   Additional Information Private Pay [id number]  Adelene Amas, [place]

## 2019-02-28 NOTE — ED Notes (Signed)
Patient's dinner tray at bedside.

## 2019-02-28 NOTE — ED Notes (Signed)
Patient's brief was changed. Patient was incontinent of urine. Patient  Was calm and cooperative with procedure. Patient spilled her grape juice on the floor. EVS was called to clean.

## 2019-02-28 NOTE — ED Notes (Signed)
Gave pt breakfast tray with juice. 

## 2019-02-28 NOTE — ED Notes (Signed)
Pt was given lunch tray with juice.

## 2019-02-28 NOTE — ED Notes (Signed)
Lunch tray at bedside. Patient refused to eat it, but tray left at bedside.

## 2019-02-28 NOTE — ED Notes (Signed)
This RN and Melanie, EDT at bedside, pt provided with bed bath at this time. This RN explained to patient who initially refused had been several days since she had had bath and cleanliness was important while in the hospital. Pt allowed this RN and Threasa Beards, EDT to clean patient. Pt's hair washed and teeth brushed. While rolling patient to get stool soiled brief/chuck and sheets out from under patient, pt noted to become agitated and violent with staff, repeatedly stating "idiots! Idiots!" and swinging/attempting to bite staff. This RN explained to patient that patient could not be left in stool soiled sheets/chucks/brief. Pt placed on clean sheets, placed in clean gown. Pt noted to be calm when clean blankets placed on patient at the end of her bed bath. Lights dimmed for comfort. Bed alarm replaced on patient. Pt denies further needs. Awaiting breakfast.

## 2019-02-28 NOTE — ED Notes (Signed)
Medications administered per MD order. Pt resting in bed with meal tray at bedside at this time. Pt able to take all medications without difficulty.

## 2019-03-01 LAB — GLUCOSE, CAPILLARY
Glucose-Capillary: 154 mg/dL — ABNORMAL HIGH (ref 70–99)
Glucose-Capillary: 155 mg/dL — ABNORMAL HIGH (ref 70–99)

## 2019-03-01 NOTE — ED Notes (Signed)
This RN spoke with Manuela Schwartz (SW), requesting pt placement status update and  information regarding physical therapy. SW st, pt refused PT in the past and no new updates regarding NSF placement.

## 2019-03-01 NOTE — ED Notes (Signed)
Pt woke to this RN speaking her name and lower leg pressure; pt was sleeping with even and unlabored respirations; pt denies needs at this time (fluids and blanket directly offered); call bell light at bedside

## 2019-03-01 NOTE — ED Notes (Signed)
Pt able to take all medications w/o difficulty. Pt able to drink water and grape juice w/o difficulty.

## 2019-03-01 NOTE — ED Notes (Signed)
This RN administered medication as MD orders. Pt able to take medications w/o difficulty,.

## 2019-03-01 NOTE — ED Provider Notes (Signed)
-----------------------------------------   5:23 AM on 03/01/2019 -----------------------------------------   Blood pressure (!) 121/58, pulse 79, temperature 98.2 F (36.8 C), temperature source Oral, resp. rate 18, height 1.626 m (5\' 4" ), weight 99 kg, SpO2 97 %.  The patient is calm and cooperative at this time.  There have been no acute events since the last update.  Awaiting disposition plan from Behavioral Medicine and/or Social Work team(s).   Hinda Kehr, MD 03/01/19 [phone]

## 2019-03-01 NOTE — ED Notes (Signed)
Pt calm and cooperative. Pt's brief is clean and dry at this time. This RN checked for pressure points due to pt unwilling to be repositioned. This RN did not noticed pressure points on ankles, calfves, back, sacrum, buttocks.  This RN encourage pt to eat breakfast, pt ate 40% of her meal and 5oz of grape juice.  Pt is calm and cooperative at this time.  This Rn will continue to monitor pt.

## 2019-03-01 NOTE — ED Notes (Signed)
This RN offered pt to reposition pt on her side in order to get her out supine post ion. Pt st "no, thank you. I'm ok for now". This RN insisted and educated pt that repositioning is important in order to prevent skin breakdown and pressure points. Pt refuses this RN's interventions at this time. St "don't touch me. Close my door and turn off the lights" This RN will continue to monitor pt.

## 2019-03-01 NOTE — ED Notes (Signed)
This RN asked pt how does pt ambulate at home. Pt st "I use a walker".

## 2019-03-01 NOTE — ED Notes (Signed)
This RN at bedside with a walker  offering assistance to ambulate pt. Pt refuses to stand/walk at this time.  This RN offered pt a recliner in order to repositioned pt. Pt st "no, I don't need it". Pt refuses any assistance/intervention at this time.  This RN offered active ROM, pt st "leave me alone idiot". Pt refuses to perform passive ROM.

## 2019-03-01 NOTE — ED Notes (Signed)
Pt provided with lunch tray a this time. Pt able to eat about 25% of her meal. Pt given grape juice 8oz,and water per pt's request.

## 2019-03-01 NOTE — ED Notes (Signed)
Pt provided with dinner tray and grape juice at this time.

## 2019-03-01 NOTE — ED Notes (Addendum)
This RN offered to performed peri care/brief change. Pt st "no, I am ot wet". This RN advised/educated pt about skin care and the importance of ADLs.  Pt allowing this RN to change brief. Brief changed. Pt able to turn on her own. Passive ROM.

## 2019-03-02 NOTE — ED Notes (Addendum)
Pt brief was soiled with urine and feces. Pt cleaned with soap and water and brief changed at this time. Pt clean and dry and barrier cream applied to pt's bottom.

## 2019-03-02 NOTE — Progress Notes (Signed)
Physical Therapy Evaluation Patient Details Name: Becky Stout MRN: [id number] DOB: Sep 11, 1955 Today's Date: 03/02/2019   History of Present Illness  Becky Stout is a 64 y.o. female  s/p UTI, scattered embolic CVAs and confusion with a fall. PMHx: B12 deficiency, type 2 diabetes not on treatment, hypertension, obesity, WPW syndrome. Pt DC to home after extensive admission at [facility], pt became weak, emetic, altered, and less mobile, refusing HH services set up for her Pt was participatory with PT in the last few weeks, but in a very limited capacity, apprehension regarding falls precluding meaningful interventions. Pt has not been especially motivated to return to AMB. Today there was a new referral to evaluate patient. Patient refused to sit up or ambulate.  Given patient's lack of motivation and limited participation, pt warrants no skilled PT services at this time.  Clinical Impression  Patient was evaluated today as last week she was [facility] due to not wanting to participate in therapy. She allowed PT to test her leg strength and she performed rolling left and right. She refused to attempt supine<>sit, or transfer assessment or gait assessment. She states that she can do all these things. She will not cooperate in PT assessment and therefore no skilled PT is recommended at this time.    Follow Up Recommendations Supervision/Assistance - 24 hour    Equipment Recommendations  None recommended by PT    Recommendations for Other Services       Precautions / Restrictions Restrictions Weight Bearing Restrictions: No      Mobility  Bed Mobility Overal bed mobility: Modified Independent Bed Mobility: Rolling Rolling: Modified independent (Device/Increase time)   Supine to sit: (patient refused) Sit to supine: (patient refused)      Transfers Overall transfer level: (patient refused)                  Ambulation/Gait Ambulation/Gait assistance: (patient refused)               Stairs            Wheelchair Mobility    Modified Rankin (Stroke Patients Only)       Balance Overall balance assessment: (patient refused)                                           Pertinent Vitals/Pain Pain Assessment: No/denies pain    Home Living Family/patient expects to be discharged to:: Private residence Living Arrangements: Parent Available Help at Discharge: Family Type of Home: House Home Access: Stairs to enter Entrance Stairs-Rails: Right;Left;Can reach both Technical brewer of Steps: 4 Home Layout: One level Home Equipment: Shower seat      Prior Function Level of Independence: Independent with assistive device(s)               Hand Dominance   Dominant Hand: Right    Extremity/Trunk Assessment   Upper Extremity Assessment Upper Extremity Assessment: Overall WFL for tasks assessed    Lower Extremity Assessment Lower Extremity Assessment: Generalized weakness(-3/5 BLE hip flexors)       Communication   Communication: No difficulties(aat times she makes no sense)  Cognition Arousal/Alertness: Awake/alert Behavior During Therapy: WFL for tasks assessed/performed;Flat affect Overall Cognitive Status: No family/caregiver present to determine baseline cognitive functioning  General Comments      Exercises     Assessment/Plan    PT Assessment All further PT needs can be met in the next venue of care  PT Problem List         PT Treatment Interventions      PT Goals (Current goals can be found in the Care Plan section)       Frequency     Barriers to discharge        Co-evaluation               AM-PAC PT "6 Clicks" Mobility  Outcome Measure Help needed turning from your back to your side while in a flat bed without using bedrails?: None Help needed moving from lying on your back to sitting on the side of a flat bed without  using bedrails?: (patient refused) Help needed moving to and from a bed to a chair (including a wheelchair)?: (patient refused) Help needed standing up from a chair using your arms (e.g., wheelchair or bedside chair)?: (patient refused) Help needed to walk in hospital room?: (patient refused) Help needed climbing 3-5 steps with a railing? : (patient refused) 6 Click Score: 4    End of Session     Patient left: in bed;with bed alarm set Nurse Communication: Mobility status PT Visit Diagnosis: (patient refused)    Time: 4290-3795 PT Time Calculation (min) (ACUTE ONLY): 20 min   Charges:   PT Evaluation $PT Eval Low Complexity: 1 Low            Alanson Puls, PT 03/02/2019, 2:33 PM

## 2019-03-02 NOTE — ED Notes (Signed)
Pt brief soiled with urine and feces. Pt brief and pad change. Pt is clean and dry and repositioned at this time.

## 2019-03-02 NOTE — ED Notes (Addendum)
Pt declines offer for ROM, getting of bed, toileting, oral care. Brief dry. Pt refuses FSBS.

## 2019-03-02 NOTE — Progress Notes (Addendum)
CSW faxed updated clinical information to facilities for review, including therapy notes.  CSW requested Caryl Pina, RN order PT consult for this patient as it has been several weeks since her last evaluation.  Madilyn Fireman, MSW, LCSW-A Transitions of Care  Clinical Social Worker  [facility] Emergency Departments  Medical ICU [phone]

## 2019-03-02 NOTE — ED Notes (Addendum)
Pt alert to self and place. Six old meal trays removed from pt's room. Pt states she ate "something" at dinner. Pt taking sips of grape juice currently from straw and cup in room. Pt declines offer to assist to chair, declines offer for ROM. Pt declines offer for oral care or other hygeine. Pt is currently wearing glasses. resps unlabored. Pt states "leave me alone". Pt moving around in bed voluntarily.

## 2019-03-03 LAB — GLUCOSE, CAPILLARY: Glucose-Capillary: 127 mg/dL — ABNORMAL HIGH (ref 70–99)

## 2019-03-03 NOTE — ED Notes (Signed)
Pt sleeping, resps unlabored with a rate of 12. Water at bedside, call bell at right side.

## 2019-03-03 NOTE — ED Provider Notes (Signed)
-----------------------------------------   6:18 AM on 03/03/2019 -----------------------------------------   Blood pressure 121/65, pulse 64, temperature 98.1 F (36.7 C), temperature source Oral, resp. rate 18, height 5\' 4"  (1.626 m), weight 99 kg, SpO2 96 %.  The patient is calm and cooperative at this time.  There have been no acute events since the last update.  Awaiting disposition plan from Behavioral Medicine and/or Social Work team(s).    Merlyn Lot, MD 03/03/19 [phone]

## 2019-03-03 NOTE — ED Notes (Signed)
Pt cleansed of incontinent urine and stool. Pt with redness noted to medial thighs. Pt left without brief to encourage air movement around genitals due to beginning redness of inner medial thigh skin and redness to perineum. Pt hitting staff and refusing to cooperate during cleansing. Explanation given to pt of need to cleanse of stool and urine multiple times.

## 2019-03-03 NOTE — ED Notes (Signed)
Pt awoken easily- pt hair washed with shower cap- pt ate a couple bites of food but requested lights back off

## 2019-03-03 NOTE — ED Notes (Signed)
Report to ariel, rn.  

## 2019-03-03 NOTE — ED Notes (Addendum)
Pt awoken easily- pt chucks pads and gown changed and clean blankets given- pt wiped down with bed bath wipes- pt sat up to be able to eat

## 2019-03-03 NOTE — ED Notes (Signed)
Pt given dinner tray with grape juice

## 2019-03-03 NOTE — ED Notes (Signed)
Pt continues to lay in bed with eyes closed and even respirations- placed pt lunch tray on pt side table

## 2019-03-04 LAB — RESPIRATORY PANEL BY RT PCR (FLU A&B, COVID)
Influenza A by PCR: NEGATIVE
Influenza B by PCR: NEGATIVE
SARS Coronavirus 2 by RT PCR: NEGATIVE

## 2019-03-04 LAB — GLUCOSE, CAPILLARY
Glucose-Capillary: 145 mg/dL — ABNORMAL HIGH (ref 70–99)
Glucose-Capillary: 132 mg/dL — ABNORMAL HIGH (ref 70–99)
Glucose-Capillary: 148 mg/dL — ABNORMAL HIGH (ref 70–99)
Glucose-Capillary: 123 mg/dL — ABNORMAL HIGH (ref 70–99)

## 2019-03-04 NOTE — ED Notes (Signed)
Re-introduced myself to the pt and discarded her left over meal tray.

## 2019-03-04 NOTE — ED Notes (Signed)
Pt provided dinner tray.

## 2019-03-04 NOTE — Social Work (Signed)
TOC SW:  Patient has bed acceptance at [facility] in [place], [place].  I spoke with Broadus John the Admissions Director who requested a rapid COVID test for patient. This SW made the request to the ED MD.  This SW contacted [place], patient's sister, and informed her of bed placement and financial responsibilities since patient is private pay.  Bed will is ready and available as soon as patient's COVID results are ready and negative.

## 2019-03-04 NOTE — ED Notes (Signed)
Pt's sister is planning on bringing some clothes for the pt this evening.

## 2019-03-04 NOTE — ED Notes (Signed)
Pt clean and dry at this time. Linens and brief changed at [facility] per report from [place], [place]. Will continue to monitor for incontinence. Pt request to rest until breakfast arrives.

## 2019-03-04 NOTE — ED Notes (Signed)
Called pt's sister Cassell Clement and provided update.

## 2019-03-04 NOTE — ED Notes (Signed)
Pt provided w/ lunch at bedside.

## 2019-03-04 NOTE — ED Notes (Signed)
Pt's sister dropped off clothes in anticipation for pt's placement tomorrow. Pt's clothes are at bedside in pt's belongings bag.

## 2019-03-04 NOTE — ED Notes (Signed)
Pt incontinent of urine. Bed linen soaked. Pt cleaned and changed. Bed linen changed. Pt repositioned in bed. No further needs at this time.

## 2019-03-04 NOTE — ED Provider Notes (Signed)
-----------------------------------------   3:43 AM on 03/04/2019 -----------------------------------------   Blood pressure 135/73, pulse 77, temperature 98.2 F (36.8 C), temperature source Oral, resp. rate 18, height 1.626 m (5\' 4" ), weight 99 kg, SpO2 95 %.  The patient is calm and cooperative at this time.  There have been no acute events since the last update.  Awaiting disposition plan from Behavioral Medicine and/or Social Work team(s).   Hinda Kehr, MD 03/04/19 [phone]

## 2019-03-04 NOTE — ED Notes (Signed)
Pt clean and dry resting in bed. No further needs at this time.

## 2019-03-04 NOTE — ED Notes (Signed)
Pt drank 1 grape juice and a sausage patty. Pt clean and dry

## 2019-03-05 LAB — GLUCOSE, CAPILLARY: Glucose-Capillary: 124 mg/dL — ABNORMAL HIGH (ref 70–99)

## 2019-03-05 MED ORDER — LORAZEPAM 1 MG PO TABS
1.0000 mg | ORAL_TABLET | Freq: Once | ORAL | Status: AC
  Administered 2019-03-05: 1 mg via ORAL
  Filled 2019-03-05: qty 1

## 2019-03-05 NOTE — Social Work (Signed)
TOC SW: Patient will be going to [facility], room#103, call report to [phone]. This SW will contact patient's sisters to update her.

## 2019-03-05 NOTE — ED Notes (Signed)
When attempting to do over d/c information, pt informed that she was going to go to [facility] for assistance with care. Pt states " no I don't go there". RN asked pt what she meant and pt states " I don't go to those places, Im not signing that, Im not going anywhere". Social work notified and are contacting pt sister at this time

## 2019-03-05 NOTE — ED Notes (Signed)
Social worker spoke to family regarding pt discharge. Pt refusing to sign discharge papers and states "I am not going anywhere". Social work states due to recent diagnosis of dementia and inability to care for self pt unable to refuse transfer to [facility]. Pt was also very aggressive and swatting and biting at staff when staff was cleaning pt up after an episode of bowel incontinence prior to [facility] arrival to transport. On arrival of [facility], pt continued to be aggressive with staff when attempting to move pt from bed to stretcher for transport. Pt was reminded several times that she was going to [facility] for further PT and evaluation to determine a potential for pt to be able to return home.

## 2019-03-05 NOTE — Social Work (Addendum)
TOC SW:  Spoke to patient at length about discharge plan and she refused to sign discharge paperwork.  Patient's sister Becky Stout spoke with patient several times and patient would agree with sister about signing paperwork and then ask this SW what I needed from her and when this SW told her she needed to sign discharge paperwork,patient refused to sign, stating "Maybe later there are things I need to do", when SW asked patient what do you need to do, patient replied "you know drink my juice and stuff".    This SW explained to patient she could not remain in the ED long-term, and she was going to a facility that would give her the same level of care she received in ED.  Patient would listen carefully and then revert to stating "she couldn't do anything or go anywhere because I have things I need to do".  Patient has a difficult time understanding why she couldn't return home at this time, and difficulty with understanding long-term scenarios.  This SW spoke with ED MD and nurse about patient's refusal to sign paperwork.  Patient has been diagnosed with Dementia and is unable to make decisions for herself at this time and will be transferred to the facility this morning.  This SW spoke with patient's sister Becky Stout, and explained to sister patient will be transferred today. Patient's sister Becky Stout agreed with this decision and will meet patient at [facility] this afternoon.

## 2019-03-05 NOTE — ED Provider Notes (Addendum)
-----------------------------------------   7:57 AM on 03/05/2019 -----------------------------------------  Blood pressure (!) 117/57, pulse 65, temperature 98.1 F (36.7 C), temperature source Oral, resp. rate 16, height 5\' 4"  (1.626 m), weight 99 kg, SpO2 98 %.  The patient is calm and cooperative at this time.  There have been no acute events since the last update.  Awaiting disposition plan from Behavioral Medicine team.   Blake Divine, MD 03/05/19 0757  ----------------------------------------- 9:30 AM on 03/05/2019 -----------------------------------------  Patient accepted for placement to [facility].   Blake Divine, MD 03/05/19 0930

## 2020-05-23 IMAGING — MR MR HEAD W/O CM
9 series · 48 of 48 positions shown · non-contrast
Comparison: Head CT 11/03/2018

CLINICAL DATA: Altered mental status.

EXAM:
MRI HEAD WITHOUT CONTRAST
TECHNIQUE: Multiplanar, multiecho pulse sequences of the brain and surrounding
structures were obtained without intravenous contrast.

[Series 3: DWI · axial · 4.0mm · 1.17mm/px · z∈[-16,+128]mm · 9 of 66 slices shown (1 of 6)]
[im 1/66]
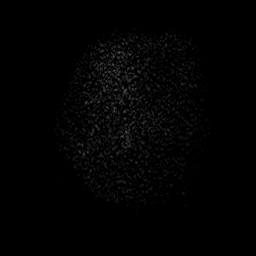
[im 9/66]
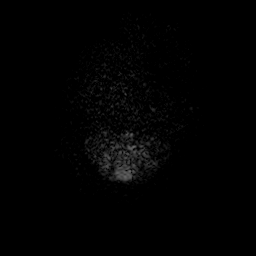
[im 17/66]
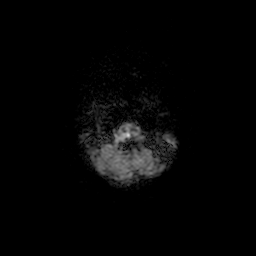
[im 25/66]
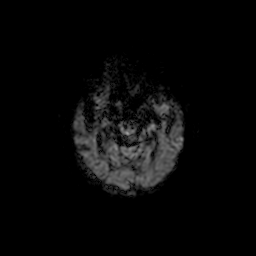
[im 33/66]
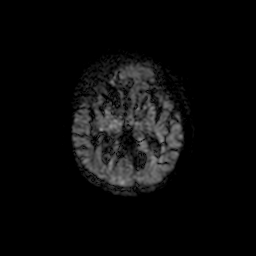
[im 41/66]
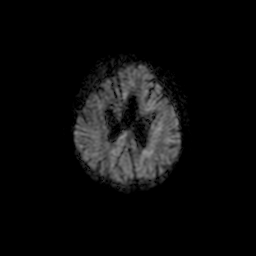
[im 49/66]
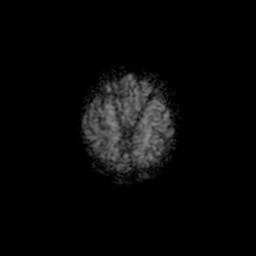
[im 57/66]
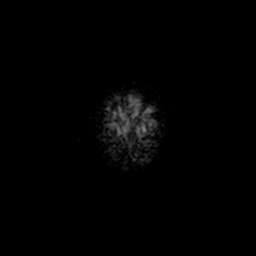
[im 66/66]
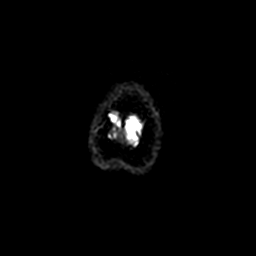

[Series 4: T1 · sagittal · 5.0mm · 0.47mm/px · 2 of 20 slices shown]
[im 1/20]
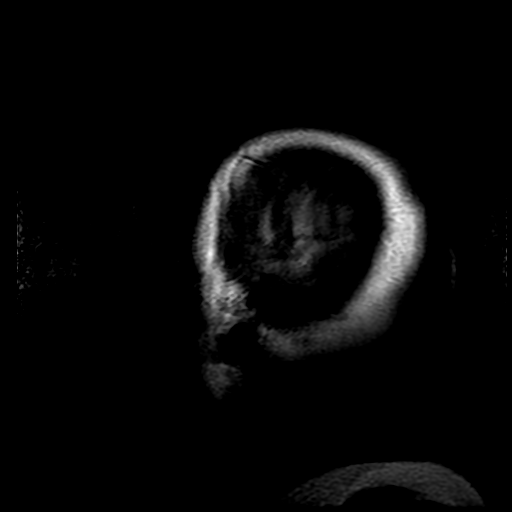
[im 20/20]
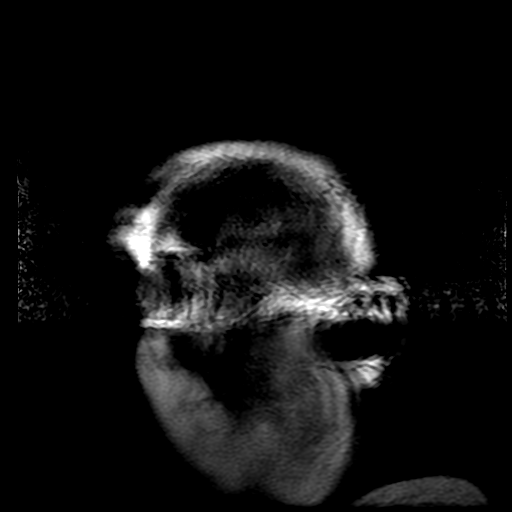

[Series 5: T2 · axial · 5.0mm · 0.86mm/px · z∈[-14,+135]mm · 3 of 26 slices shown]
[im 1/26]
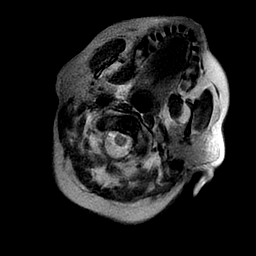
[im 13/26]
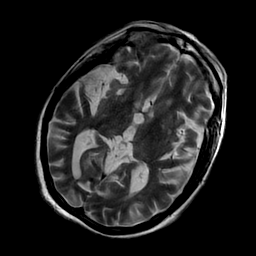
[im 26/26]
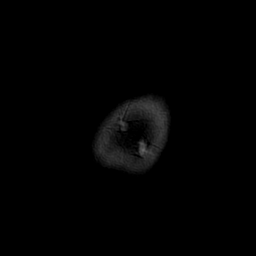

[Series 6: FLAIR · axial · 3.0mm · 0.86mm/px · z∈[-14,+135]mm · 3 of 26 slices shown]
[im 1/26]
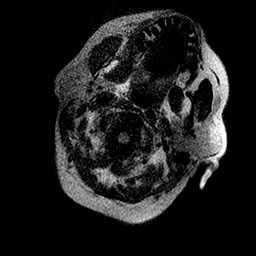
[im 13/26]
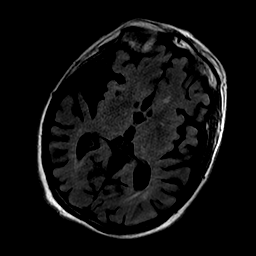
[im 26/26]
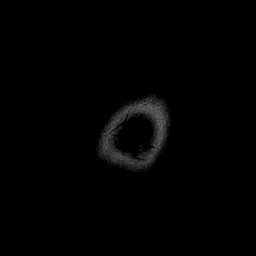

[Series 7: DWI · coronal · 4.0mm · 1.09mm/px · 10 of 84 slices shown (2 of 6)]
[im 1/84]
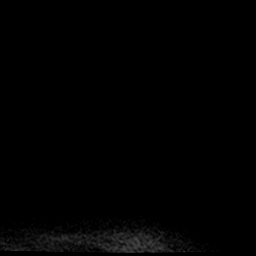
[im 10/84]
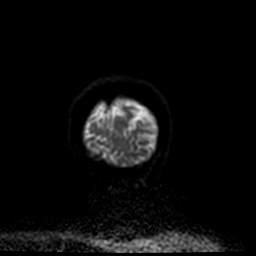
[im 19/84]
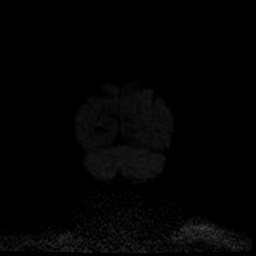
[im 28/84]
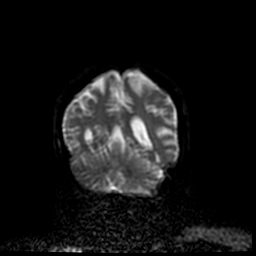
[im 37/84]
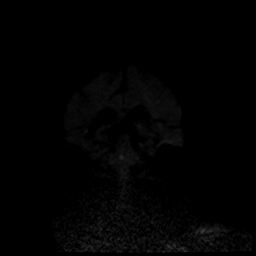
[im 47/84]
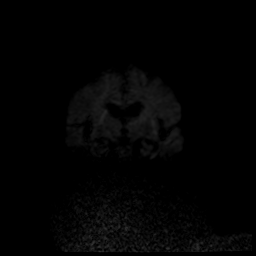
[im 56/84]
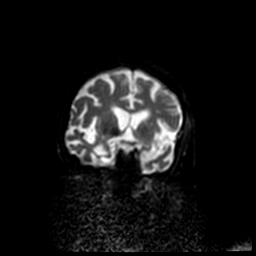
[im 65/84]
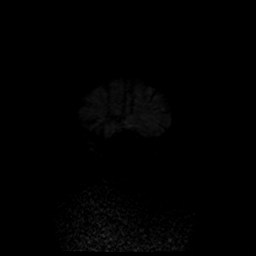
[im 74/84]
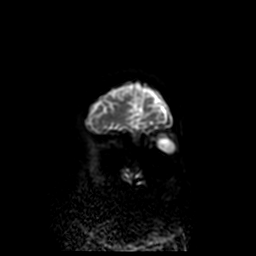
[im 84/84]
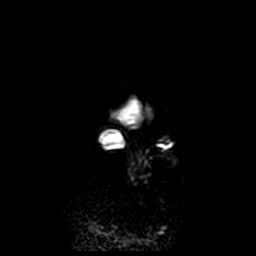

[Series 8: DWI · axial · 4.0mm · 1.17mm/px · z∈[-17,+127]mm · 8 of 66 slices shown (3 of 6)]
[im 1/66]
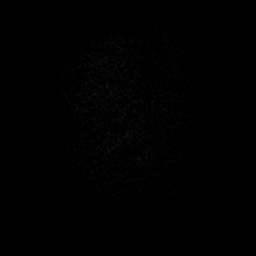
[im 10/66]
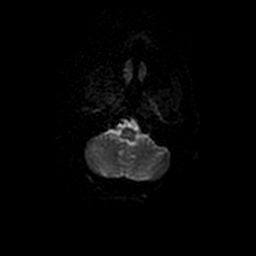
[im 19/66]
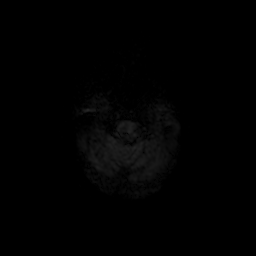
[im 28/66]
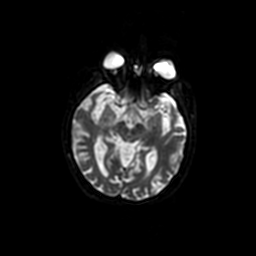
[im 38/66]
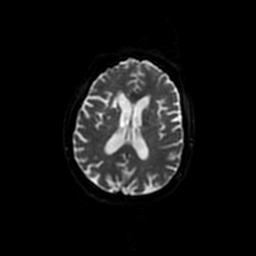
[im 47/66]
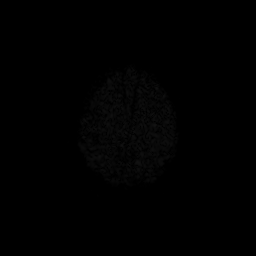
[im 56/66]
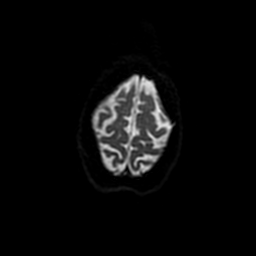
[im 66/66]
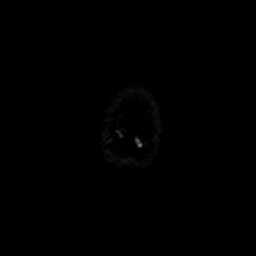

[Series 300: DWI · axial · 4.0mm · 1.17mm/px · z∈[-16,+128]mm · 4 of 33 slices shown (4 of 6)]
[im 1/33]
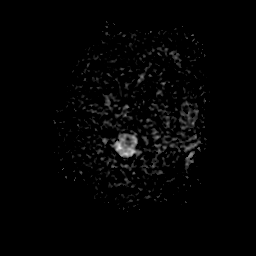
[im 11/33]
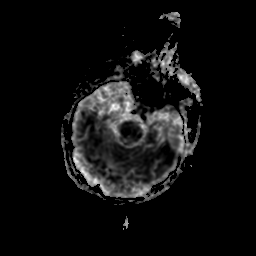
[im 22/33]
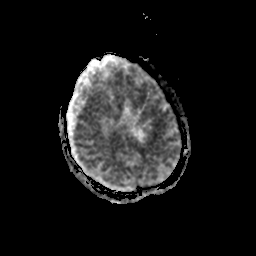
[im 33/33]
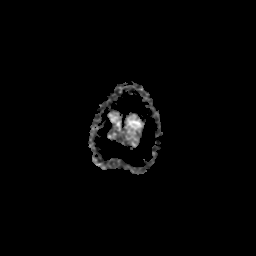

[Series 700: DWI · coronal · 4.0mm · 1.09mm/px · 5 of 42 slices shown (5 of 6)]
[im 1/42]
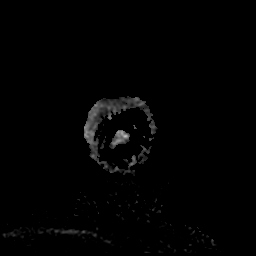
[im 11/42]
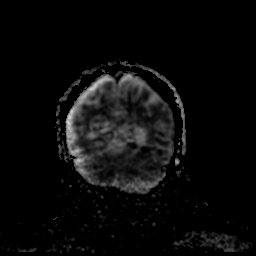
[im 21/42]
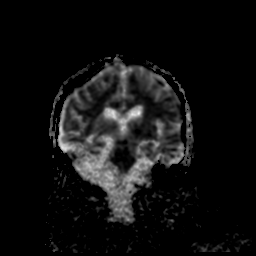
[im 31/42]
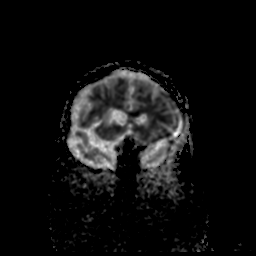
[im 42/42]
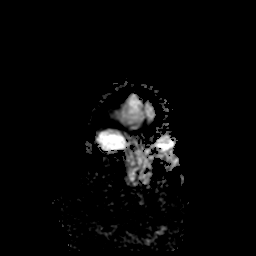

[Series 800: DWI · axial · 4.0mm · 1.17mm/px · z∈[-17,+127]mm · 4 of 33 slices shown (6 of 6)]
[im 1/33]
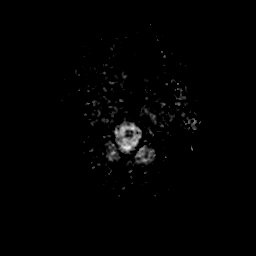
[im 11/33]
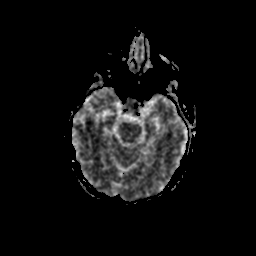
[im 22/33]
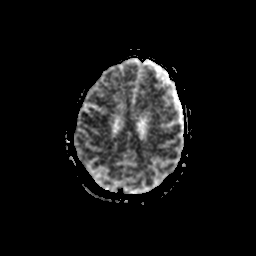
[im 33/33]
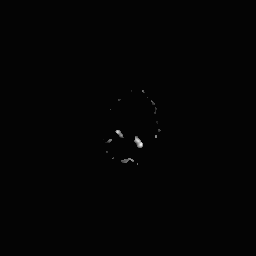

[48 of 48 positions shown; findings below may reference images not displayed]

FINDINGS: The examination had to be discontinued prior to completion due to
patient confusion and combativeness. Axial and coronal diffusion,
sagittal T1, axial T2, and axial FLAIR sequences were obtained and
are overall severely motion degraded.

Brain: There are scattered small foci of trace diffusion weighted
signal hyperintensity in suggestive of acute infarcts including in
the basal ganglia regions bilaterally, thalami, anterior body of the
corpus callosum on the left, and pons. No gross intracranial
hemorrhage, intracranial mass effect, or extra-axial fluid
collection is identified. Moderate cerebral atrophy is advanced for
age. Chronic infarcts are present in the basal ganglia bilaterally.

Vascular: Major intracranial vascular flow voids are grossly
preserved.

Skull and upper cervical spine: No destructive skull lesion.

Sinuses/Orbits: Unremarkable orbits. Paranasal sinuses and mastoid
air cells are clear.

Other: None.
IMPRESSION: 1. Incomplete, severely motion degraded examination.
2. Scattered small acute infarcts in both cerebral hemispheres and
brainstem.
3. Chronic basal ganglia infarcts and moderately advanced cerebral
atrophy.
